# Patient Record
Sex: Female | Born: 1940
Health system: Southern US, Community
[De-identification: ages and names within clinical notes are randomized; demographics above are authoritative.]

## PROBLEM LIST (undated history)

## (undated) DIAGNOSIS — M1990 Unspecified osteoarthritis, unspecified site: Secondary | ICD-10-CM

## (undated) DIAGNOSIS — I10 Essential (primary) hypertension: Secondary | ICD-10-CM

## (undated) DIAGNOSIS — T7840XA Allergy, unspecified, initial encounter: Secondary | ICD-10-CM

## (undated) DIAGNOSIS — I209 Angina pectoris, unspecified: Secondary | ICD-10-CM

## (undated) DIAGNOSIS — G2581 Restless legs syndrome: Secondary | ICD-10-CM

## (undated) DIAGNOSIS — K219 Gastro-esophageal reflux disease without esophagitis: Secondary | ICD-10-CM

## (undated) DIAGNOSIS — C50919 Malignant neoplasm of unspecified site of unspecified female breast: Secondary | ICD-10-CM

## (undated) DIAGNOSIS — M719 Bursopathy, unspecified: Secondary | ICD-10-CM

## (undated) DIAGNOSIS — F419 Anxiety disorder, unspecified: Secondary | ICD-10-CM

## (undated) DIAGNOSIS — H269 Unspecified cataract: Secondary | ICD-10-CM

## (undated) DIAGNOSIS — M8580 Other specified disorders of bone density and structure, unspecified site: Secondary | ICD-10-CM

## (undated) DIAGNOSIS — R0600 Dyspnea, unspecified: Secondary | ICD-10-CM

## (undated) DIAGNOSIS — E7800 Pure hypercholesterolemia, unspecified: Secondary | ICD-10-CM

## (undated) DIAGNOSIS — G4700 Insomnia, unspecified: Secondary | ICD-10-CM

## (undated) DIAGNOSIS — N200 Calculus of kidney: Secondary | ICD-10-CM

## (undated) DIAGNOSIS — Z923 Personal history of irradiation: Secondary | ICD-10-CM

## (undated) HISTORY — DX: Other specified disorders of bone density and structure, unspecified site: M85.80

## (undated) HISTORY — DX: Unspecified osteoarthritis, unspecified site: M19.90

## (undated) HISTORY — DX: Unspecified cataract: H26.9

## (undated) HISTORY — PX: ROTATOR CUFF REPAIR: SHX139

## (undated) HISTORY — DX: Anxiety disorder, unspecified: F41.9

## (undated) HISTORY — DX: Insomnia, unspecified: G47.00

## (undated) HISTORY — PX: ABDOMINAL HYSTERECTOMY: SHX81

## (undated) HISTORY — DX: Allergy, unspecified, initial encounter: T78.40XA

## (undated) HISTORY — DX: Calculus of kidney: N20.0

## (undated) HISTORY — DX: Bursopathy, unspecified: M71.9

## (undated) HISTORY — DX: Restless legs syndrome: G25.81

## (undated) HISTORY — DX: Malignant neoplasm of unspecified site of unspecified female breast: C50.919

## (undated) HISTORY — DX: Gastro-esophageal reflux disease without esophagitis: K21.9

## (undated) HISTORY — PX: CHOLECYSTECTOMY: SHX55

## (undated) HISTORY — PX: APPENDECTOMY: SHX54

---

## 1986-10-06 DIAGNOSIS — G4700 Insomnia, unspecified: Secondary | ICD-10-CM

## 1986-10-06 HISTORY — DX: Insomnia, unspecified: G47.00

## 1998-10-06 DIAGNOSIS — G2581 Restless legs syndrome: Secondary | ICD-10-CM

## 1998-10-06 HISTORY — DX: Restless legs syndrome: G25.81

## 1999-05-20 ENCOUNTER — Other Ambulatory Visit: Admission: RE | Admit: 1999-05-20 | Discharge: 1999-05-20 | Payer: Self-pay | Admitting: Family Medicine

## 2000-10-06 DIAGNOSIS — M8580 Other specified disorders of bone density and structure, unspecified site: Secondary | ICD-10-CM

## 2000-10-06 DIAGNOSIS — R42 Dizziness and giddiness: Secondary | ICD-10-CM

## 2000-10-06 HISTORY — DX: Other specified disorders of bone density and structure, unspecified site: M85.80

## 2000-10-06 HISTORY — DX: Dizziness and giddiness: R42

## 2001-07-15 ENCOUNTER — Encounter: Admission: RE | Admit: 2001-07-15 | Discharge: 2001-07-15 | Payer: Self-pay | Admitting: Family Medicine

## 2001-07-15 ENCOUNTER — Encounter: Payer: Self-pay | Admitting: Family Medicine

## 2001-07-23 ENCOUNTER — Encounter: Admission: RE | Admit: 2001-07-23 | Discharge: 2001-07-23 | Payer: Self-pay | Admitting: Family Medicine

## 2001-07-23 ENCOUNTER — Encounter: Payer: Self-pay | Admitting: Family Medicine

## 2001-10-06 DIAGNOSIS — E7800 Pure hypercholesterolemia, unspecified: Secondary | ICD-10-CM

## 2001-10-06 DIAGNOSIS — I10 Essential (primary) hypertension: Secondary | ICD-10-CM

## 2001-10-06 HISTORY — DX: Pure hypercholesterolemia, unspecified: E78.00

## 2001-10-06 HISTORY — DX: Essential (primary) hypertension: I10

## 2003-12-20 ENCOUNTER — Encounter: Admission: RE | Admit: 2003-12-20 | Discharge: 2003-12-20 | Payer: Self-pay | Admitting: Family Medicine

## 2006-10-06 DIAGNOSIS — K219 Gastro-esophageal reflux disease without esophagitis: Secondary | ICD-10-CM

## 2006-10-06 DIAGNOSIS — M1990 Unspecified osteoarthritis, unspecified site: Secondary | ICD-10-CM

## 2006-10-06 HISTORY — DX: Unspecified osteoarthritis, unspecified site: M19.90

## 2006-10-06 HISTORY — DX: Gastro-esophageal reflux disease without esophagitis: K21.9

## 2007-02-25 ENCOUNTER — Encounter: Admission: RE | Admit: 2007-02-25 | Discharge: 2007-02-25 | Payer: Self-pay | Admitting: Family Medicine

## 2007-05-20 ENCOUNTER — Ambulatory Visit: Payer: Self-pay | Admitting: Internal Medicine

## 2007-06-22 ENCOUNTER — Ambulatory Visit: Payer: Self-pay | Admitting: Internal Medicine

## 2007-08-24 ENCOUNTER — Ambulatory Visit: Payer: Self-pay | Admitting: Internal Medicine

## 2010-02-03 HISTORY — PX: CATARACT EXTRACTION: SUR2

## 2010-06-02 ENCOUNTER — Emergency Department (HOSPITAL_COMMUNITY): Admission: EM | Admit: 2010-06-02 | Discharge: 2010-06-02 | Payer: Self-pay | Admitting: Emergency Medicine

## 2010-12-20 LAB — BASIC METABOLIC PANEL
BUN: 15 mg/dL (ref 6–23)
CO2: 26 mEq/L (ref 19–32)
Calcium: 10.4 mg/dL (ref 8.4–10.5)
Chloride: 106 mEq/L (ref 96–112)
Creatinine, Ser: 0.63 mg/dL (ref 0.4–1.2)
GFR calc Af Amer: >60 mL/min (ref >60)
GFR calc non Af Amer: >60 mL/min (ref >60)
Glucose, Bld: 94 mg/dL (ref 70–99)
Potassium: 3.8 mEq/L (ref 3.5–5.1)
Sodium: 138 mEq/L (ref 135–145)

## 2010-12-20 LAB — TROPONIN I: Troponin I: 0.02 ng/mL (ref 0.00–0.06)

## 2010-12-20 LAB — CK TOTAL AND CKMB (NOT AT ARMC)
CK, MB: 1.6 ng/mL (ref 0.3–4.0)
Relative Index: INVALID (ref 0.0–2.5)
Total CK: 87 U/L (ref 7–177)

## 2011-02-18 NOTE — Assessment & Plan Note (Signed)
[facility]                             PULMONARY OFFICE NOTE   Alicia [name]                          MRN:          [id number]  DATE:05/20/2007                            DOB:          1940/12/09    PROBLEM:  A 70 year old woman self-referred because of persistent dry  cough.  I had worked with her at the old office in 2004 for bronchitis  with a past history of pneumonia, histoplasmosis and bronchoscopy.  She  had had pneumococcal vaccine twice at that point and she had quit  smoking some years before.  Chest x-ray then showed old calcified right  paratracheal nodes and some question of hyperinflation.  Spirometry  scores were normal.  She says she had been well and comfortable in  recent years until she went to [place] this spring.  She caught a  bronchitis syndrome there with fever, flu-like symptoms, head and chest  congestion, and some purulent sputum.  That cleared after antibiotics  except for residual dry cough, which is her complaint today.  She  describes this as hacking until sometimes she nearly retches, with a  sense of some substernal tightness and postnasal drip.  She is aware of  reflux as a separate, ongoing problem.  Cough is intermittent.  She is  not noticing wheeze but thinks she is somewhat more short of breath with  exertion than is usual for her.  She thinks pollens and respiratory  irritants make her worse.   MEDICATION:  1. Metoprolol 50 mg.  2. Nexium 40 mg.  3. Zolpidem 10 mg.  4. Simvastatin.  5. Calcium.  6. Fish oil.  7. Lutein.  8. Vitamins.  9. Occasional Claritin.   No medication allergy.   REVIEW OF SYSTEMS:  Exertional dyspnea, nonproductive cough, tightness  rather than chest pain.  Reflux symptoms are suppressed by Nexium but  still present.  Nasal congestion, sneezing.  Occasional racing  palpitation as she gets into bed at night, self-limited after a few  minutes.  Her feet swell, especially in  the last couple of months.  No  fever, adenopathy, or blood.   PAST HISTORY:  In [place] several years ago, histoplasmosis was  suspected based on calcification on chest x-ray with hemoptysis and she  had bronchoscopy.  There was no recurrence of hemoptysis.  She has been  exposed to tuberculosis in the past.  Restless leg syndrome.  Arthritis  with joint pains.  Cholecystectomy, hysterectomy.  Elevated cholesterol.  Hypertension.   SOCIAL HISTORY:  Quit smoking 10 years ago.  She is married.  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   FAMILY HISTORY:  All are smokers and many had COPD.  Two died of  emphysema.  Some have heart disease.   OBJECTIVE:  Weight 145 pounds, BP 122/62, pulse 53, room air saturation  97%.  No evident distress.  Dentures.  Medium build.  Dry cough.  Nose  and throat are clear with no evidence of drainage, no stridor, no neck  vein distention or adenopathy.  Chest clear.  Heart sounds regular  without murmur.  No enlargement of the liver or spleen is palpated.  There is no cyanosis, clubbing or edema.   IMPRESSION:  1. Bronchitis syndrome with cough.  This may fit a post-viral      bronchitis syndrome, although residual sinus drainage and      contribution from reflux are not excluded.  2. Old granulomatous disease thought to be histoplasmosis with episode      of hemoptysis generating bronchoscopy years ago in [place].   PLAN:  Chest x-ray.  Sample Advair 250/50.  Home nebulizer Xopenex 1.25  mg and Depo-Medrol 80 mg IM.  Pulmonary function tests.  Schedule return  3-4 weeks, earlier p.r.n.     Alicia D. Maple Hudson, MD, Alicia [name], FACP  Electronically Signed    CDY/MedQ  DD: 05/20/2007  DT: 05/21/2007  Job #: [id number]   cc:   Alicia [name]. Alicia [name], M.D.

## 2011-02-18 NOTE — Assessment & Plan Note (Signed)
[facility]                             PULMONARY OFFICE NOTE   Alicia [name], Alicia [name]                          MRN:          [id number]  DATE:08/24/2007                            DOB:          Apr 21, 1941    PULMONARY FOLLOW-UP:   PROBLEMS:  1. Bronchitis.  2. Old granulomatous disease/histoplasmosis/history of hemoptysis.  3. Esophageal reflux.   HISTORY:  We note again that previous TB skin test has been negative as  of June 25, 2007.  She had a significant viral syndrome with  bronchitis and head congestion treated with Mucinex and Delsym with some  residual left temple headache.  Since then Spiriva helps.  She is having  less reflux and thinks Nexium helps that discomfort.  Has had a flu  shot.  Had second pneumonia vaccine in 2007.   MEDICATIONS:  1. Metoprolol 50 mg.  2. Nexium 40 mg.  3. Zolpidem 10 mg.  4. Simvastatin.  5. Calcium.  6. Fish oil.  7. Vitamins.  8. Tylenol.   No medication allergies.   Chest x-ray May 20, 2007, showed no active cardiopulmonary disease  but did show old granulomatous disease with calcified right paratracheal  nodes.   OBJECTIVE:  Weight 152 pounds, BP 110/62, pulse 55.  Room air saturation  99%.  Reddish throat.  Dentures.  Slight upper airway-type cough with no  stridor or visible drainage.  CHEST:  Clear.  Heart sounds normal.  No adenopathy found.   IMPRESSION:  Recent viral bronchitis syndrome, still with incomplete  clearing.  Complicating issue of esophageal reflux, old known  granulomatous scar, multifactorial cough.   PLAN:  Reflux precautions were emphasized.  She will make use of Mucinex  and Delsym p.r.n.  Schedule return 4 months, earlier p.r.n.     Clinton D. Maple Hudson, MD, Tonny Bollman, FACP  Electronically Signed    CDY/MedQ  DD: 08/25/2007  DT: 08/26/2007  Job #: [id number]   cc:   Bryan Lemma. Manus Gunning, M.D.

## 2011-02-18 NOTE — Assessment & Plan Note (Signed)
[facility]                             PULMONARY OFFICE NOTE   [name], Alicia [name]                          MRN:          [id number]  DATE:06/22/2007                            DOB:          1941-06-21    PROBLEMS:  1. Bronchitis.  2. Old granulomatous disease/histoplasmosis/history of hemoptysis.  3. Esophageal reflux.   HISTORY:  This former smoker with remote exposure to her father's  tuberculosis (TB skin test always negative) reports cough is somewhat  better.  It remains nonproductive.  She has a foreign body sensation in  her throat at times.  Sinuses burn when she bends over.  Little sputum,  nothing purulent.  Frequent night sweats which she relates to hormones.  Advair sample did not help.  She is aware of reflux and takes Nexium but  needed to be reminded of antireflux measures.   MEDICATION:  1. Metoprolol 50 mg.  2. Nexium 40 mg.  3. Ambien 10 mg.  4. Simvastatin.  5. Fish oil.  6. Lutein.  7. Claritin.  8. Tylenol for arthritis.   No medication allergy.   OBJECTIVE:  Weight 145 pounds, BP 114/62, pulse 61, room air saturation  98%.  Minor dry cough.  Red throat.  Voice quality is normal.  Nasal  airway clear without postnasal drainage.  Heart sounds regular without  murmur.  No adenopathy.   Chest x-ray from April 14 showed no active cardiopulmonary disease.  There was evidence of old granulomatous disease with calcified right  paratracheal nodes.  Mild prominence of the interstitium was related to  chronic bronchitis.  Pulmonary function tests done today show mild  obstruction in small airways with response to bronchodilator, suggestion  of air trapping with increased residual volume, and reduced diffusion  capacity at 67% of predicted.   IMPRESSION:  1. Cough reflecting chronic bronchitis and gastroesophageal reflux      disease.  2. Asthmatic bronchitis/chronic obstructive pulmonary disease.   PLAN:  1. Sample  Astelin nasal spray for rhinitis.  2. Try sample Spiriva one inhalation daily.  3. PPD skin test is placed.  4. Doubt night sweats reflect infection and I suggested that she      review her hormone status with Dr. Manus Gunning.  5. Reflux precautions, considering need for GI referral.  Dr. Manus Gunning      may want to chose who would see her.  I suspect reflux is a      significant part of her cough.   Scheduled return 2 months, earlier p.r.n.     Clinton D. Maple Hudson, MD, Tonny Bollman, FACP  Electronically Signed    CDY/MedQ  DD: 06/22/2007  DT: 06/23/2007  Job #: [id number]   cc:   Bryan Lemma. Manus Gunning, M.D.

## 2011-10-09 ENCOUNTER — Ambulatory Visit
Admission: RE | Admit: 2011-10-09 | Discharge: 2011-10-09 | Disposition: A | Discharge status: Home / Self Care | Payer: Medicare Other | Source: Ambulatory Visit | Attending: Family Medicine | Admitting: Family Medicine

## 2011-10-09 ENCOUNTER — Other Ambulatory Visit: Payer: Self-pay | Admitting: Family Medicine

## 2011-10-09 DIAGNOSIS — S0990XA Unspecified injury of head, initial encounter: Secondary | ICD-10-CM | POA: Diagnosis not present

## 2011-10-09 DIAGNOSIS — R200 Anesthesia of skin: Secondary | ICD-10-CM

## 2011-10-09 DIAGNOSIS — IMO0002 Reserved for concepts with insufficient information to code with codable children: Secondary | ICD-10-CM | POA: Diagnosis not present

## 2011-10-09 DIAGNOSIS — R51 Headache: Secondary | ICD-10-CM | POA: Diagnosis not present

## 2011-10-09 DIAGNOSIS — G44009 Cluster headache syndrome, unspecified, not intractable: Secondary | ICD-10-CM

## 2011-10-09 DIAGNOSIS — R209 Unspecified disturbances of skin sensation: Secondary | ICD-10-CM | POA: Diagnosis not present

## 2011-10-09 DIAGNOSIS — S0993XA Unspecified injury of face, initial encounter: Secondary | ICD-10-CM | POA: Diagnosis not present

## 2011-10-16 DIAGNOSIS — M545 Low back pain, unspecified: Secondary | ICD-10-CM | POA: Diagnosis not present

## 2011-10-16 DIAGNOSIS — M19049 Primary osteoarthritis, unspecified hand: Secondary | ICD-10-CM | POA: Diagnosis not present

## 2011-10-16 DIAGNOSIS — M064 Inflammatory polyarthropathy: Secondary | ICD-10-CM | POA: Diagnosis not present

## 2011-10-16 DIAGNOSIS — Z79899 Other long term (current) drug therapy: Secondary | ICD-10-CM | POA: Diagnosis not present

## 2011-11-27 DIAGNOSIS — M545 Low back pain, unspecified: Secondary | ICD-10-CM | POA: Diagnosis not present

## 2011-11-27 DIAGNOSIS — Z79899 Other long term (current) drug therapy: Secondary | ICD-10-CM | POA: Diagnosis not present

## 2011-11-27 DIAGNOSIS — M19049 Primary osteoarthritis, unspecified hand: Secondary | ICD-10-CM | POA: Diagnosis not present

## 2011-12-24 DIAGNOSIS — R0602 Shortness of breath: Secondary | ICD-10-CM | POA: Diagnosis not present

## 2011-12-24 DIAGNOSIS — I10 Essential (primary) hypertension: Secondary | ICD-10-CM | POA: Diagnosis not present

## 2011-12-24 DIAGNOSIS — M159 Polyosteoarthritis, unspecified: Secondary | ICD-10-CM | POA: Diagnosis not present

## 2011-12-24 DIAGNOSIS — E7800 Pure hypercholesterolemia, unspecified: Secondary | ICD-10-CM | POA: Diagnosis not present

## 2011-12-24 DIAGNOSIS — Z79899 Other long term (current) drug therapy: Secondary | ICD-10-CM | POA: Diagnosis not present

## 2011-12-24 DIAGNOSIS — R5381 Other malaise: Secondary | ICD-10-CM | POA: Diagnosis not present

## 2011-12-24 DIAGNOSIS — Z23 Encounter for immunization: Secondary | ICD-10-CM | POA: Diagnosis not present

## 2012-01-08 DIAGNOSIS — E7800 Pure hypercholesterolemia, unspecified: Secondary | ICD-10-CM | POA: Diagnosis not present

## 2012-01-08 DIAGNOSIS — R002 Palpitations: Secondary | ICD-10-CM | POA: Diagnosis not present

## 2012-01-08 DIAGNOSIS — R079 Chest pain, unspecified: Secondary | ICD-10-CM | POA: Diagnosis not present

## 2012-01-08 DIAGNOSIS — I10 Essential (primary) hypertension: Secondary | ICD-10-CM | POA: Diagnosis not present

## 2012-01-13 DIAGNOSIS — I10 Essential (primary) hypertension: Secondary | ICD-10-CM | POA: Diagnosis not present

## 2012-01-13 DIAGNOSIS — E7800 Pure hypercholesterolemia, unspecified: Secondary | ICD-10-CM | POA: Diagnosis not present

## 2012-01-13 DIAGNOSIS — R079 Chest pain, unspecified: Secondary | ICD-10-CM | POA: Diagnosis not present

## 2012-01-13 DIAGNOSIS — R002 Palpitations: Secondary | ICD-10-CM | POA: Diagnosis not present

## 2012-02-29 DIAGNOSIS — L255 Unspecified contact dermatitis due to plants, except food: Secondary | ICD-10-CM | POA: Diagnosis not present

## 2012-05-24 DIAGNOSIS — L255 Unspecified contact dermatitis due to plants, except food: Secondary | ICD-10-CM | POA: Diagnosis not present

## 2012-06-15 DIAGNOSIS — Z79899 Other long term (current) drug therapy: Secondary | ICD-10-CM | POA: Diagnosis not present

## 2012-06-15 DIAGNOSIS — K219 Gastro-esophageal reflux disease without esophagitis: Secondary | ICD-10-CM | POA: Diagnosis not present

## 2012-06-15 DIAGNOSIS — Z23 Encounter for immunization: Secondary | ICD-10-CM | POA: Diagnosis not present

## 2012-06-15 DIAGNOSIS — I10 Essential (primary) hypertension: Secondary | ICD-10-CM | POA: Diagnosis not present

## 2012-06-15 DIAGNOSIS — M159 Polyosteoarthritis, unspecified: Secondary | ICD-10-CM | POA: Diagnosis not present

## 2012-06-15 DIAGNOSIS — E7800 Pure hypercholesterolemia, unspecified: Secondary | ICD-10-CM | POA: Diagnosis not present

## 2012-09-09 DIAGNOSIS — Z1231 Encounter for screening mammogram for malignant neoplasm of breast: Secondary | ICD-10-CM | POA: Diagnosis not present

## 2012-10-06 DIAGNOSIS — F419 Anxiety disorder, unspecified: Secondary | ICD-10-CM

## 2012-10-06 HISTORY — DX: Anxiety disorder, unspecified: F41.9

## 2012-10-29 DIAGNOSIS — H52229 Regular astigmatism, unspecified eye: Secondary | ICD-10-CM | POA: Diagnosis not present

## 2012-10-29 DIAGNOSIS — H5210 Myopia, unspecified eye: Secondary | ICD-10-CM | POA: Diagnosis not present

## 2012-10-29 DIAGNOSIS — H01009 Unspecified blepharitis unspecified eye, unspecified eyelid: Secondary | ICD-10-CM | POA: Diagnosis not present

## 2012-11-08 DIAGNOSIS — N899 Noninflammatory disorder of vagina, unspecified: Secondary | ICD-10-CM | POA: Diagnosis not present

## 2012-11-08 DIAGNOSIS — L293 Anogenital pruritus, unspecified: Secondary | ICD-10-CM | POA: Diagnosis not present

## 2012-11-24 ENCOUNTER — Emergency Department (HOSPITAL_COMMUNITY)
Admission: EM | Admit: 2012-11-24 | Discharge: 2012-11-24 | Disposition: A | Discharge status: Home / Self Care | Payer: Medicare Other | Attending: Emergency Medicine | Admitting: Emergency Medicine

## 2012-11-24 ENCOUNTER — Encounter (HOSPITAL_COMMUNITY): Payer: Self-pay | Admitting: *Deleted

## 2012-11-24 ENCOUNTER — Emergency Department (HOSPITAL_COMMUNITY): Payer: Medicare Other

## 2012-11-24 DIAGNOSIS — N201 Calculus of ureter: Secondary | ICD-10-CM | POA: Insufficient documentation

## 2012-11-24 DIAGNOSIS — Z8639 Personal history of other endocrine, nutritional and metabolic disease: Secondary | ICD-10-CM | POA: Insufficient documentation

## 2012-11-24 DIAGNOSIS — Z9089 Acquired absence of other organs: Secondary | ICD-10-CM | POA: Diagnosis not present

## 2012-11-24 DIAGNOSIS — I10 Essential (primary) hypertension: Secondary | ICD-10-CM | POA: Insufficient documentation

## 2012-11-24 DIAGNOSIS — Z90710 Acquired absence of both cervix and uterus: Secondary | ICD-10-CM | POA: Diagnosis not present

## 2012-11-24 DIAGNOSIS — R112 Nausea with vomiting, unspecified: Secondary | ICD-10-CM | POA: Diagnosis not present

## 2012-11-24 DIAGNOSIS — Z862 Personal history of diseases of the blood and blood-forming organs and certain disorders involving the immune mechanism: Secondary | ICD-10-CM | POA: Insufficient documentation

## 2012-11-24 HISTORY — DX: Pure hypercholesterolemia, unspecified: E78.00

## 2012-11-24 HISTORY — DX: Essential (primary) hypertension: I10

## 2012-11-24 LAB — URINALYSIS, ROUTINE W REFLEX MICROSCOPIC
Bilirubin Urine: NEGATIVE
Glucose, UA: NEGATIVE mg/dL
Ketones, ur: NEGATIVE mg/dL
Leukocytes, UA: NEGATIVE
Nitrite: NEGATIVE
Protein, ur: NEGATIVE mg/dL
Specific Gravity, Urine: >1.03 — ABNORMAL HIGH (ref 1.005–1.030)
Urobilinogen, UA: 0.2 mg/dL (ref 0.0–1.0)
pH: 5.5 (ref 5.0–8.0)

## 2012-11-24 LAB — BASIC METABOLIC PANEL
BUN: 28 mg/dL — ABNORMAL HIGH (ref 6–23)
CO2: 28 mEq/L (ref 19–32)
Calcium: 10.5 mg/dL (ref 8.4–10.5)
Chloride: 103 mEq/L (ref 96–112)
Creatinine, Ser: 0.9 mg/dL (ref 0.50–1.10)
GFR calc Af Amer: 74 mL/min — ABNORMAL LOW (ref >90)
GFR calc non Af Amer: 64 mL/min — ABNORMAL LOW (ref >90)
Glucose, Bld: 105 mg/dL — ABNORMAL HIGH (ref 70–99)
Potassium: 4 mEq/L (ref 3.5–5.1)
Sodium: 139 mEq/L (ref 135–145)

## 2012-11-24 LAB — CBC
HCT: 41.3 % (ref 36.0–46.0)
Hemoglobin: 14.4 g/dL (ref 12.0–15.0)
MCH: 30.7 pg (ref 26.0–34.0)
MCHC: 34.9 g/dL (ref 30.0–36.0)
MCV: 88.1 fL (ref 78.0–100.0)
Platelets: 327 10*3/uL (ref 150–400)
RBC: 4.69 MIL/uL (ref 3.87–5.11)
RDW: 12.5 % (ref 11.5–15.5)
WBC: 11 10*3/uL — ABNORMAL HIGH (ref 4.0–10.5)

## 2012-11-24 LAB — URINE MICROSCOPIC-ADD ON

## 2012-11-24 MED ORDER — ONDANSETRON HCL 4 MG/2ML IJ SOLN
INTRAMUSCULAR | Status: AC
  Filled 2012-11-24: qty 2

## 2012-11-24 MED ORDER — ONDANSETRON HCL 4 MG/2ML IJ SOLN
4.0000 mg | Freq: Once | INTRAMUSCULAR | Status: AC
  Administered 2012-11-24: 4 mg via INTRAVENOUS

## 2012-11-24 MED ORDER — FENTANYL CITRATE 0.05 MG/ML IJ SOLN
50.0000 ug | Freq: Once | INTRAMUSCULAR | Status: AC
  Administered 2012-11-24: 50 ug via INTRAVENOUS
  Filled 2012-11-24: qty 2

## 2012-11-24 MED ORDER — TAMSULOSIN HCL 0.4 MG PO CAPS: 0.4000 mg | ORAL_CAPSULE | Freq: Every day | ORAL | Status: DC

## 2012-11-24 MED ORDER — HYDROMORPHONE HCL PF 1 MG/ML IJ SOLN
1.0000 mg | Freq: Once | INTRAMUSCULAR | Status: AC
  Administered 2012-11-24: 1 mg via INTRAVENOUS
  Filled 2012-11-24: qty 1

## 2012-11-24 MED ORDER — FENTANYL CITRATE 0.05 MG/ML IJ SOLN
50.0000 ug | Freq: Once | INTRAMUSCULAR | Status: AC
  Administered 2012-11-24: 50 ug via INTRAVENOUS

## 2012-11-24 MED ORDER — FENTANYL CITRATE 0.05 MG/ML IJ SOLN
INTRAMUSCULAR | Status: AC
  Administered 2012-11-24: 50 ug via INTRAVENOUS
  Filled 2012-11-24: qty 2

## 2012-11-24 MED ORDER — ONDANSETRON HCL 4 MG/2ML IJ SOLN
4.0000 mg | Freq: Once | INTRAMUSCULAR | Status: AC
  Administered 2012-11-24: 4 mg via INTRAVENOUS
  Filled 2012-11-24: qty 2

## 2012-11-24 MED ORDER — OXYCODONE-ACETAMINOPHEN 5-325 MG PO TABS: 1.0000 | ORAL_TABLET | ORAL | Status: DC | PRN

## 2012-11-24 NOTE — ED Notes (Signed)
Pt actively vomiting with c/o severe (10/10) left flank pain.

## 2012-11-24 NOTE — ED Notes (Signed)
Left flank pain with vomiting began this morning at ~ 0800

## 2012-11-24 NOTE — ED Provider Notes (Addendum)
History     CSN: [id number]  Arrival date & time 11/24/12  1156   First MD Initiated Contact with Patient 11/24/12 1306      Chief Complaint  Patient presents with  . Flank Pain    (Consider location/radiation/quality/duration/timing/severity/associated sxs/prior treatment) Patient is a 72 y.o. female presenting with flank pain. The history is provided by the patient and medical records. No language interpreter was used.  Flank Pain This is a new problem. The current episode started 6 to 12 hours ago (Pt had onset of severe left flank pain around 8 AM, accompanied by nausea and vomiting.). Episode frequency: She's had 2 episodes of vomiting. The pain waxes and wanes in severity. The problem has not changed since onset.Pertinent negatives include no chest pain, no abdominal pain, no headaches and no shortness of breath. Nothing aggravates the symptoms. Relieved by: Patient was medicated with fentanyl and Zofran by the ED nurse.    Past Medical History  Diagnosis Date  . Hypertension   . Hypercholesteremia     Past Surgical History  Procedure Laterality Date  . Abdominal hysterectomy    . Cholecystectomy    . Appendectomy      No family history on file.  History  Substance Use Topics  . Smoking status: Never Smoker   . Smokeless tobacco: Not on file  . Alcohol Use: No    OB History   Grav Para Term Preterm Abortions TAB SAB Ect Mult Living                  Review of Systems  Constitutional: Negative for fever and chills.  HENT: Negative.   Eyes: Negative.   Respiratory: Negative.  Negative for shortness of breath.   Cardiovascular: Negative for chest pain.  Gastrointestinal: Positive for nausea and vomiting. Negative for abdominal pain and diarrhea.  Genitourinary: Positive for flank pain. Negative for dysuria and hematuria.  Musculoskeletal: Negative.   Skin: Negative.   Neurological: Negative.  Negative for headaches.  Psychiatric/Behavioral: Negative.      Allergies  Review of patient's allergies indicates no known allergies.  Home Medications  No current outpatient prescriptions on file.  BP 189/86  Pulse 68  Temp(Src) 98 F (36.7 C) (Oral)  Resp 20  Ht 5\' 2"  (1.575 m)  Wt 147 lb (66.679 kg)  BMI 26.88 kg/m2  SpO2 96%  Physical Exam  Nursing note and vitals reviewed. Constitutional: She is oriented to person, place, and time. She appears well-developed and well-nourished. Distressed: in mild distress with left flank pain. She had previously been medicated for pain and nausea.  HENT:  Head: Normocephalic and atraumatic.  Right Ear: External ear normal.  Left Ear: External ear normal.  Mouth/Throat: Oropharynx is clear and moist.  Eyes: Conjunctivae and EOM are normal. Pupils are equal, round, and reactive to light.  Neck: Normal range of motion. Neck supple.  Cardiovascular: Normal rate, regular rhythm and normal heart sounds.   Pulmonary/Chest: Effort normal and breath sounds normal.  Abdominal: Soft. Bowel sounds are normal. She exhibits no distension and no mass. There is no tenderness.  Musculoskeletal:  She has left CVA tenderness but no point tenderness or rebound or rigidity.  Neurological: She is alert and oriented to person, place, and time.  No Sensory or motor deficit.  Skin: Skin is warm and dry.  Psychiatric: She has a normal mood and affect. Her behavior is normal.    ED Course  Procedures (including critical care time)  Labs Reviewed  CBC - Abnormal; Notable for the following:    WBC 11.0 (*)    All other components within normal limits  URINALYSIS, ROUTINE W REFLEX MICROSCOPIC  BASIC METABOLIC PANEL   8:11 PM Patient was seen and had physical examination. Lab tests were ordered. Patient had been medicated for pain and nausea already, did not need pain medicine at present.  2:30 PM Results for orders placed during the hospital encounter of 11/24/12  URINALYSIS, ROUTINE W REFLEX MICROSCOPIC       Result Value Range   Color, Urine YELLOW  YELLOW   APPearance HAZY (*) CLEAR   Specific Gravity, Urine >1.030 (*) 1.005 - 1.030   pH 5.5  5.0 - 8.0   Glucose, UA NEGATIVE  NEGATIVE mg/dL   Hgb urine dipstick LARGE (*) NEGATIVE   Bilirubin Urine NEGATIVE  NEGATIVE   Ketones, ur NEGATIVE  NEGATIVE mg/dL   Protein, ur NEGATIVE  NEGATIVE mg/dL   Urobilinogen, UA 0.2  0.0 - 1.0 mg/dL   Nitrite NEGATIVE  NEGATIVE   Leukocytes, UA NEGATIVE  NEGATIVE  CBC      Result Value Range   WBC 11.0 (*) 4.0 - 10.5 K/uL   RBC 4.69  3.87 - 5.11 MIL/uL   Hemoglobin 14.4  12.0 - 15.0 g/dL   HCT 91.4  78.2 - 95.6 %   MCV 88.1  78.0 - 100.0 fL   MCH 30.7  26.0 - 34.0 pg   MCHC 34.9  30.0 - 36.0 g/dL   RDW 21.3  08.6 - 57.8 %   Platelets 327  150 - 400 K/uL  BASIC METABOLIC PANEL      Result Value Range   Sodium 139  135 - 145 mEq/L   Potassium 4.0  3.5 - 5.1 mEq/L   Chloride 103  96 - 112 mEq/L   CO2 28  19 - 32 mEq/L   Glucose, Bld 105 (*) 70 - 99 mg/dL   BUN 28 (*) 6 - 23 mg/dL   Creatinine, Ser 4.69  0.50 - 1.10 mg/dL   Calcium 62.9  8.4 - 52.8 mg/dL   GFR calc non Af Amer 64 (*) >90 mL/min   GFR calc Af Amer 74 (*) >90 mL/min  URINE MICROSCOPIC-ADD ON      Result Value Range   Squamous Epithelial / LPF FEW (*) RARE   WBC, UA 0-2  <3 WBC/hpf   RBC / HPF 11-20  <3 RBC/hpf   Bacteria, UA FEW (*) RARE   Crystals CA OXALATE CRYSTALS (*) NEGATIVE   Urine-Other MUCOUS PRESENT     Ct Abdomen Pelvis Wo Contrast  11/24/2012  *RADIOLOGY REPORT*  Clinical Data: Severe left flank pain.  CT ABDOMEN AND PELVIS WITHOUT CONTRAST  Technique:  Multidetector CT imaging of the abdomen and pelvis was performed following the standard protocol without intravenous contrast.  Comparison: None  Findings: The lung bases are clear.  Mild emphysematous changes with right middle lobe bulla.  The distal esophagus is grossly normal.  The heart is normal in size.  The unenhanced appearance of the liver is unremarkable.  No  focal hepatic lesions or intrahepatic ductal dilatation.  The gallbladder is surgically absent.  No common bile duct dilatation.  The pancreas is normal.  Calcified splenic granulomas are noted. Normal splenic size.  The adrenal glands and right kidney are normal.  The left kidney demonstrates a high-grade obstructive findings with hydronephrosis and perinephric interstitial changes of fluid.  The left ureter is dilated down to  an obstructing 3.5 x 3.5 mm calculus at the level of the L4 vertebral body.  No distal ureteral or bladder calculi.  The stomach, duodenum, small bowel and colon are grossly normal without oral contrast.  Known cavitary changes or mass lesions.  No mesenteric or retroperitoneal mass or adenopathy.  Moderate atherosclerotic changes involving the aorta but no focal aneurysm.  The bladder is unremarkable.  The uterus is surgically absent. Both ovaries are still present and appear normal.  No pelvic mass, adenopathy or free pelvic fluid collection.  No inguinal mass or hernia.  The bony structures are intact.  IMPRESSION: 3.5 x 3.5 mm left mid ureteral calculus at the L4 level causing a high-grade obstruction of the left kidney/ureter.   Original Report Authenticated By: Rudie Meyer, M.D.     CT of abdomen/pelvis shows a left ureteral calculus 3.5 mm in diameter, that is at the level of L4.  Rx with Percocet for pain, Flomax to facilitate stone passage, pt to strain her urine to catch the stone, F/U with Dr. Jerre Simon, urologist.    1. Left ureteral calculus         Carleene Cooper III, MD 11/24/12 1437    Carleene Cooper III, MD 11/24/12 [phone]

## 2012-11-24 NOTE — ED Notes (Signed)
Pt request more pain medication before going over for CT.

## 2012-12-14 ENCOUNTER — Other Ambulatory Visit: Payer: Self-pay | Admitting: Urology

## 2012-12-14 ENCOUNTER — Ambulatory Visit (INDEPENDENT_AMBULATORY_CARE_PROVIDER_SITE_OTHER): Payer: Medicare Other | Admitting: Urology

## 2012-12-14 ENCOUNTER — Ambulatory Visit (HOSPITAL_COMMUNITY)
Admission: RE | Admit: 2012-12-14 | Discharge: 2012-12-14 | Disposition: A | Discharge status: Home / Self Care | Payer: Medicare Other | Source: Ambulatory Visit | Attending: Urology | Admitting: Urology

## 2012-12-14 DIAGNOSIS — N201 Calculus of ureter: Secondary | ICD-10-CM

## 2012-12-14 DIAGNOSIS — N200 Calculus of kidney: Secondary | ICD-10-CM | POA: Diagnosis not present

## 2012-12-14 DIAGNOSIS — R109 Unspecified abdominal pain: Secondary | ICD-10-CM | POA: Diagnosis not present

## 2012-12-16 DIAGNOSIS — E7800 Pure hypercholesterolemia, unspecified: Secondary | ICD-10-CM | POA: Diagnosis not present

## 2012-12-16 DIAGNOSIS — G479 Sleep disorder, unspecified: Secondary | ICD-10-CM | POA: Diagnosis not present

## 2012-12-16 DIAGNOSIS — K219 Gastro-esophageal reflux disease without esophagitis: Secondary | ICD-10-CM | POA: Diagnosis not present

## 2012-12-16 DIAGNOSIS — M899 Disorder of bone, unspecified: Secondary | ICD-10-CM | POA: Diagnosis not present

## 2012-12-16 DIAGNOSIS — I10 Essential (primary) hypertension: Secondary | ICD-10-CM | POA: Diagnosis not present

## 2012-12-16 DIAGNOSIS — G2589 Other specified extrapyramidal and movement disorders: Secondary | ICD-10-CM | POA: Diagnosis not present

## 2012-12-16 DIAGNOSIS — N200 Calculus of kidney: Secondary | ICD-10-CM | POA: Diagnosis not present

## 2012-12-16 DIAGNOSIS — Z79899 Other long term (current) drug therapy: Secondary | ICD-10-CM | POA: Diagnosis not present

## 2012-12-30 DIAGNOSIS — L293 Anogenital pruritus, unspecified: Secondary | ICD-10-CM | POA: Diagnosis not present

## 2013-02-01 ENCOUNTER — Ambulatory Visit (INDEPENDENT_AMBULATORY_CARE_PROVIDER_SITE_OTHER): Payer: Medicare Other | Admitting: Urology

## 2013-02-01 DIAGNOSIS — N201 Calculus of ureter: Secondary | ICD-10-CM | POA: Diagnosis not present

## 2013-02-01 DIAGNOSIS — N200 Calculus of kidney: Secondary | ICD-10-CM | POA: Diagnosis not present

## 2013-02-08 DIAGNOSIS — J019 Acute sinusitis, unspecified: Secondary | ICD-10-CM | POA: Diagnosis not present

## 2013-02-08 DIAGNOSIS — M899 Disorder of bone, unspecified: Secondary | ICD-10-CM | POA: Diagnosis not present

## 2013-02-08 DIAGNOSIS — M949 Disorder of cartilage, unspecified: Secondary | ICD-10-CM | POA: Diagnosis not present

## 2013-02-26 IMAGING — CT CT HEAD W/O CM
2 series · 16 of 30 positions shown, 18 images · non-contrast
Comparison: None.

CLINICAL DATA: A cluster headaches, numbness in the face, trauma to
the head 3 weeks ago

CT HEAD WITHOUT CONTRAST
TECHNIQUE: Contiguous axial images were obtained from the base of
the skull through the vertex without contrast.

[Series 3: head bone · axial · 0.49mm/px · z∈[+38,+152]mm · 8 of 56 slices shown]
[im 6/56  bone]
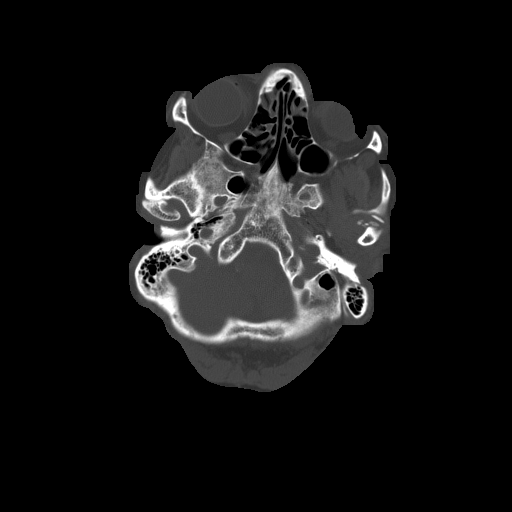
[im 12/56  bone]
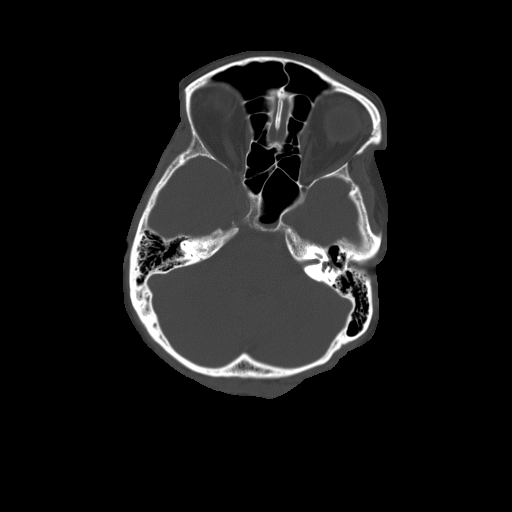
[im 18/56  bone]
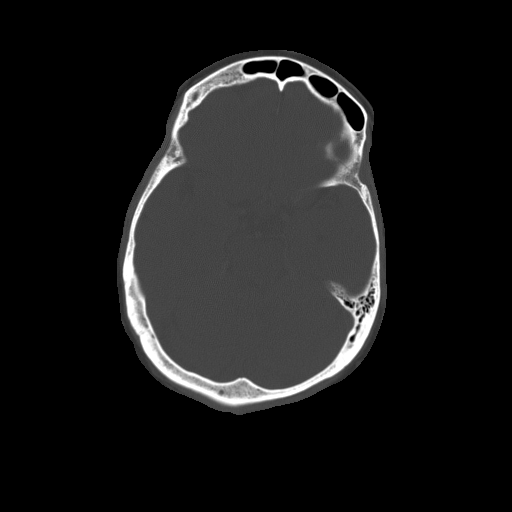
[im 24/56  bone]
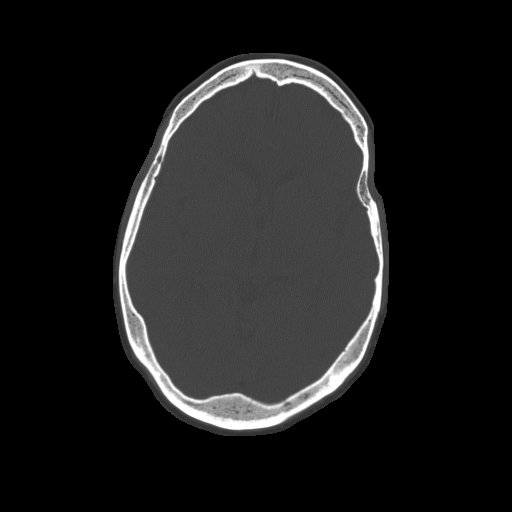
[im 32/56  bone]
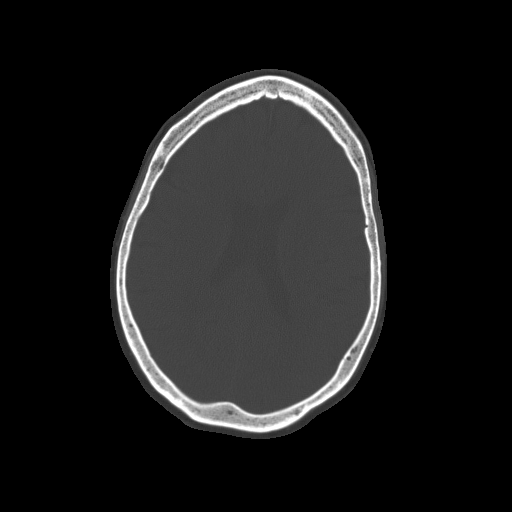
[im 38/56  bone]
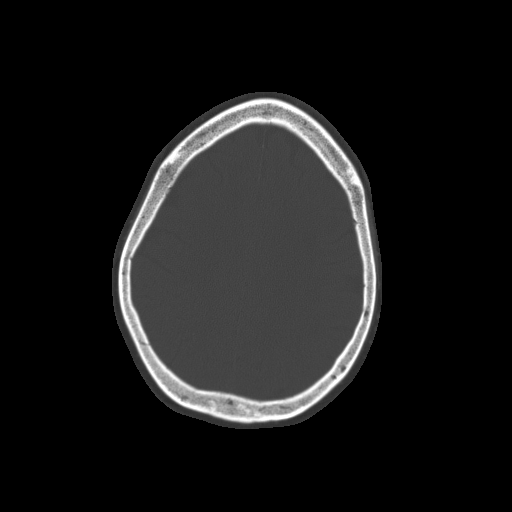
[im 44/56  bone]
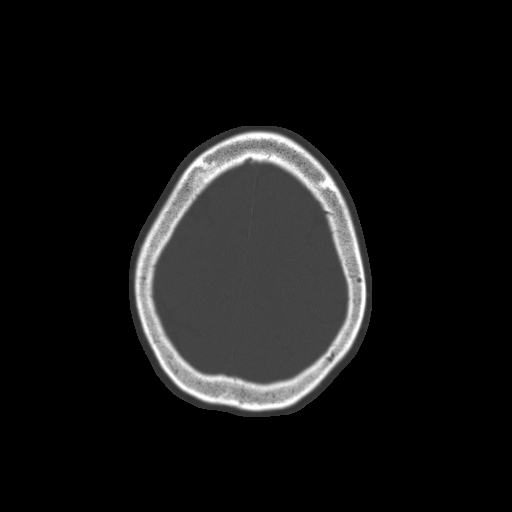
[im 50/56  bone]
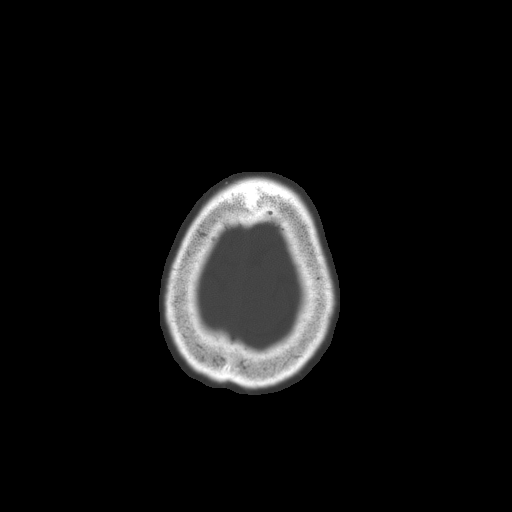

[Series 32: 3d filtered head w/o · axial · non-contrast · 0.49mm/px · z∈[+42,+150]mm · 8 of 28 slices shown, 10 images]
[im 4/28  brain]
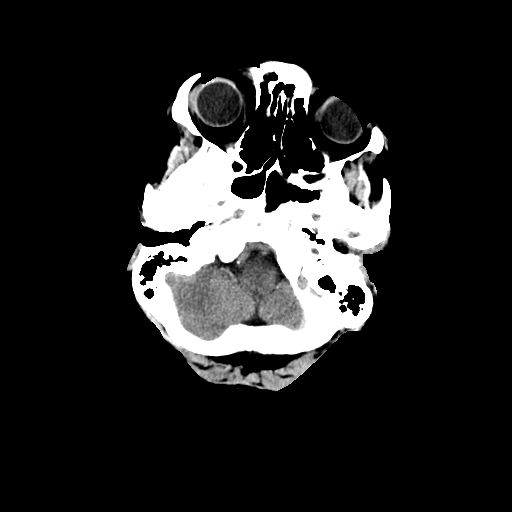
[im 4/28  bone]
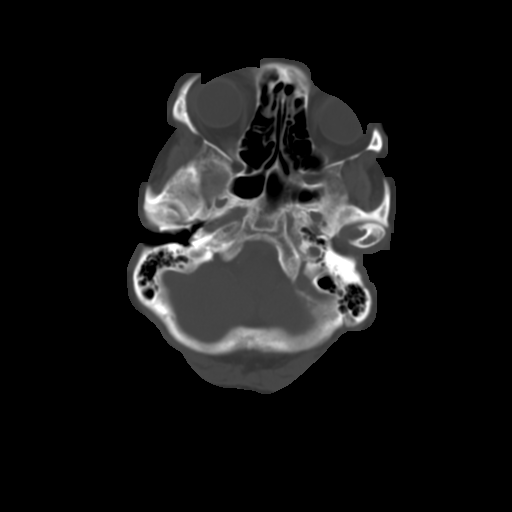
[im 7/28  brain]
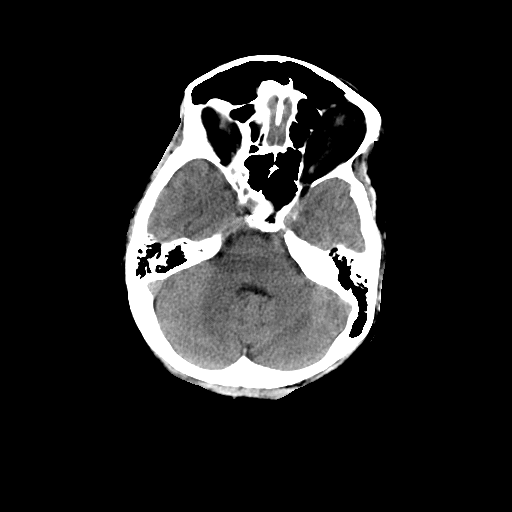
[im 10/28  brain]
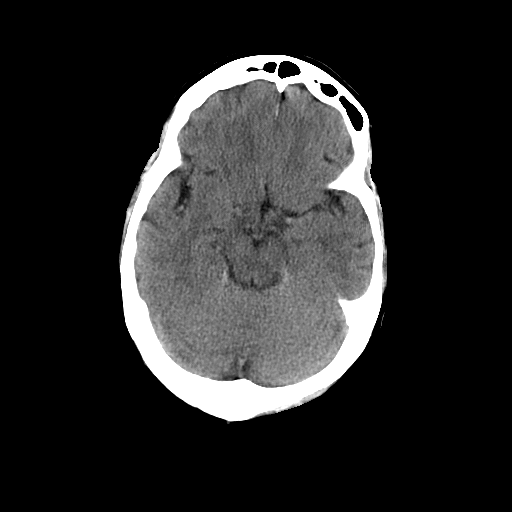
[im 13/28  brain]
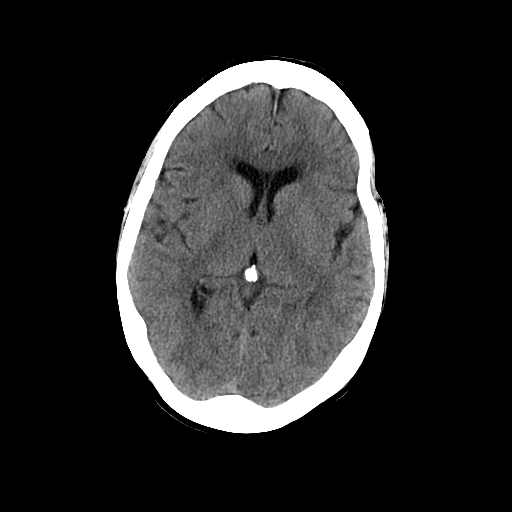
[im 16/28  brain]
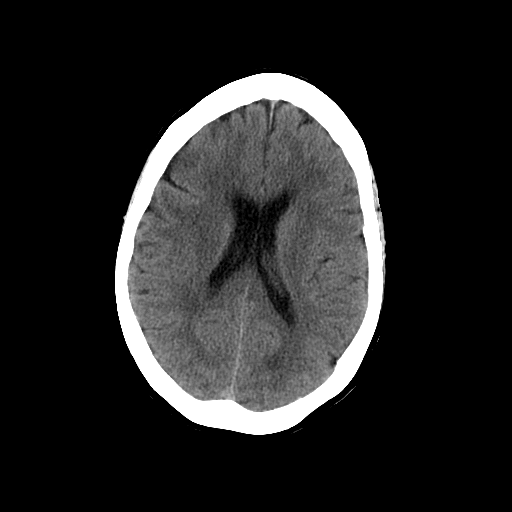
[im 16/28  bone]
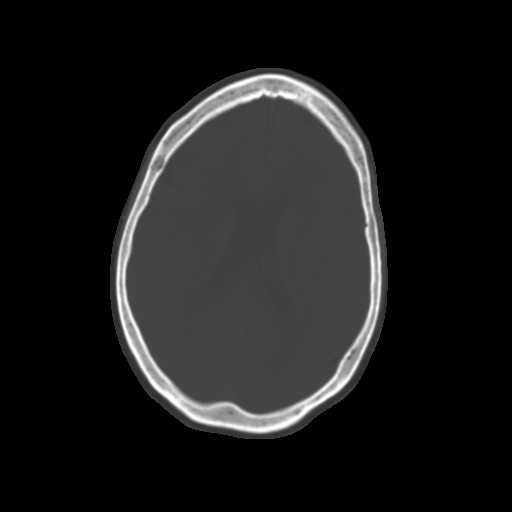
[im 19/28  brain]
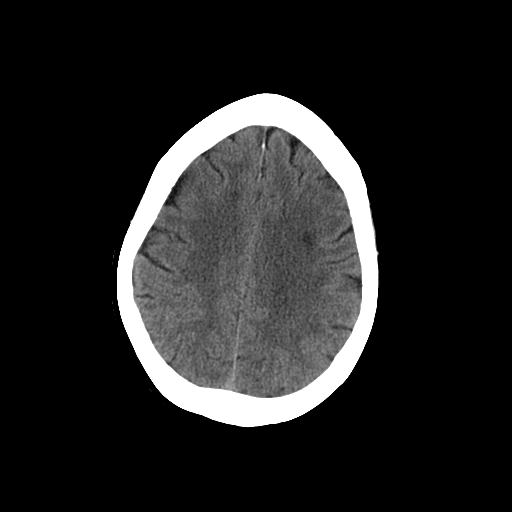
[im 22/28  brain]
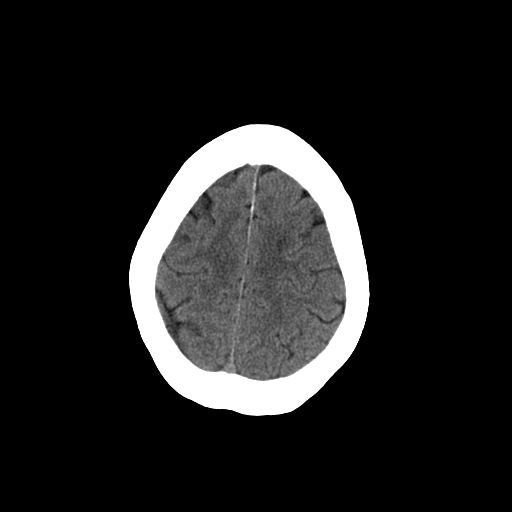
[im 25/28  brain]
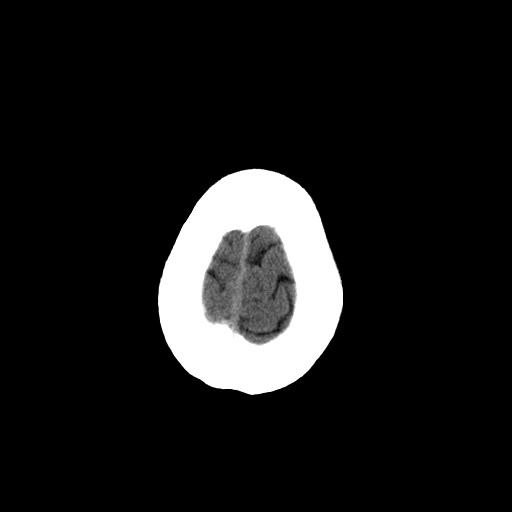

[16 of 30 positions shown; findings below may reference images not displayed]

FINDINGS: The ventricular system is normal in size and
configuration and the septum is midline position.  Only minimal
atrophy is present.  There is mild to moderate small vessel
ischemic change present within the periventricular white matter.
No hemorrhage, mass lesion, or acute infarction is seen.  On bone
window images, there is some mucosal thickening in the sphenoid
sinus and to a lesser degree in the ethmoid air cells consistent
with sinus disease.  No calvarial abnormality is seen.
IMPRESSION: 1.  Mild to moderate small vessel ischemic change.  No acute
intracranial abnormality.
2.  Mucosal thickening in the sphenoid sinus and to a lesser degree
in the ethmoid air cells consistent with sinus disease.

## 2013-03-07 ENCOUNTER — Ambulatory Visit (HOSPITAL_COMMUNITY)
Admission: RE | Admit: 2013-03-07 | Discharge: 2013-03-07 | Disposition: A | Discharge status: Home / Self Care | Payer: Medicare Other | Source: Ambulatory Visit | Attending: Urology | Admitting: Urology

## 2013-03-07 ENCOUNTER — Other Ambulatory Visit: Payer: Self-pay | Admitting: Urology

## 2013-03-07 DIAGNOSIS — N201 Calculus of ureter: Secondary | ICD-10-CM | POA: Diagnosis not present

## 2013-03-07 DIAGNOSIS — R935 Abnormal findings on diagnostic imaging of other abdominal regions, including retroperitoneum: Secondary | ICD-10-CM | POA: Diagnosis not present

## 2013-03-08 ENCOUNTER — Other Ambulatory Visit: Payer: Self-pay | Admitting: Urology

## 2013-03-08 ENCOUNTER — Ambulatory Visit (INDEPENDENT_AMBULATORY_CARE_PROVIDER_SITE_OTHER): Payer: Medicare Other | Admitting: Urology

## 2013-03-08 DIAGNOSIS — N201 Calculus of ureter: Secondary | ICD-10-CM | POA: Diagnosis not present

## 2013-03-08 DIAGNOSIS — K219 Gastro-esophageal reflux disease without esophagitis: Secondary | ICD-10-CM | POA: Diagnosis not present

## 2013-03-08 DIAGNOSIS — H8110 Benign paroxysmal vertigo, unspecified ear: Secondary | ICD-10-CM | POA: Diagnosis not present

## 2013-03-08 DIAGNOSIS — N200 Calculus of kidney: Secondary | ICD-10-CM | POA: Diagnosis not present

## 2013-03-08 DIAGNOSIS — R42 Dizziness and giddiness: Secondary | ICD-10-CM | POA: Diagnosis not present

## 2013-03-10 ENCOUNTER — Ambulatory Visit (HOSPITAL_COMMUNITY)
Admission: RE | Admit: 2013-03-10 | Discharge: 2013-03-10 | Disposition: A | Discharge status: Home / Self Care | Payer: Medicare Other | Source: Ambulatory Visit | Attending: Urology | Admitting: Urology

## 2013-03-10 DIAGNOSIS — N201 Calculus of ureter: Secondary | ICD-10-CM | POA: Diagnosis not present

## 2013-03-10 DIAGNOSIS — I700 Atherosclerosis of aorta: Secondary | ICD-10-CM | POA: Diagnosis not present

## 2013-03-10 DIAGNOSIS — D739 Disease of spleen, unspecified: Secondary | ICD-10-CM | POA: Diagnosis not present

## 2013-06-21 DIAGNOSIS — G2589 Other specified extrapyramidal and movement disorders: Secondary | ICD-10-CM | POA: Diagnosis not present

## 2013-06-21 DIAGNOSIS — Z79899 Other long term (current) drug therapy: Secondary | ICD-10-CM | POA: Diagnosis not present

## 2013-06-21 DIAGNOSIS — M899 Disorder of bone, unspecified: Secondary | ICD-10-CM | POA: Diagnosis not present

## 2013-06-21 DIAGNOSIS — Z23 Encounter for immunization: Secondary | ICD-10-CM | POA: Diagnosis not present

## 2013-06-21 DIAGNOSIS — E7800 Pure hypercholesterolemia, unspecified: Secondary | ICD-10-CM | POA: Diagnosis not present

## 2013-06-21 DIAGNOSIS — G479 Sleep disorder, unspecified: Secondary | ICD-10-CM | POA: Diagnosis not present

## 2013-06-21 DIAGNOSIS — K219 Gastro-esophageal reflux disease without esophagitis: Secondary | ICD-10-CM | POA: Diagnosis not present

## 2013-06-21 DIAGNOSIS — I10 Essential (primary) hypertension: Secondary | ICD-10-CM | POA: Diagnosis not present

## 2013-06-21 DIAGNOSIS — M159 Polyosteoarthritis, unspecified: Secondary | ICD-10-CM | POA: Diagnosis not present

## 2013-08-11 DIAGNOSIS — N200 Calculus of kidney: Secondary | ICD-10-CM | POA: Diagnosis not present

## 2013-09-29 DIAGNOSIS — S93609A Unspecified sprain of unspecified foot, initial encounter: Secondary | ICD-10-CM | POA: Diagnosis not present

## 2013-09-29 DIAGNOSIS — G4700 Insomnia, unspecified: Secondary | ICD-10-CM | POA: Diagnosis not present

## 2013-10-06 HISTORY — PX: COLONOSCOPY: SHX174

## 2013-10-14 DIAGNOSIS — S93409A Sprain of unspecified ligament of unspecified ankle, initial encounter: Secondary | ICD-10-CM | POA: Diagnosis not present

## 2013-10-14 DIAGNOSIS — F430 Acute stress reaction: Secondary | ICD-10-CM | POA: Diagnosis not present

## 2013-10-14 DIAGNOSIS — G479 Sleep disorder, unspecified: Secondary | ICD-10-CM | POA: Diagnosis not present

## 2013-12-19 DIAGNOSIS — M949 Disorder of cartilage, unspecified: Secondary | ICD-10-CM | POA: Diagnosis not present

## 2013-12-19 DIAGNOSIS — M159 Polyosteoarthritis, unspecified: Secondary | ICD-10-CM | POA: Diagnosis not present

## 2013-12-19 DIAGNOSIS — K219 Gastro-esophageal reflux disease without esophagitis: Secondary | ICD-10-CM | POA: Diagnosis not present

## 2013-12-19 DIAGNOSIS — G2589 Other specified extrapyramidal and movement disorders: Secondary | ICD-10-CM | POA: Diagnosis not present

## 2013-12-19 DIAGNOSIS — G479 Sleep disorder, unspecified: Secondary | ICD-10-CM | POA: Diagnosis not present

## 2013-12-19 DIAGNOSIS — I10 Essential (primary) hypertension: Secondary | ICD-10-CM | POA: Diagnosis not present

## 2013-12-19 DIAGNOSIS — M899 Disorder of bone, unspecified: Secondary | ICD-10-CM | POA: Diagnosis not present

## 2013-12-19 DIAGNOSIS — E7800 Pure hypercholesterolemia, unspecified: Secondary | ICD-10-CM | POA: Diagnosis not present

## 2013-12-19 DIAGNOSIS — Z79899 Other long term (current) drug therapy: Secondary | ICD-10-CM | POA: Diagnosis not present

## 2013-12-29 DIAGNOSIS — Z961 Presence of intraocular lens: Secondary | ICD-10-CM | POA: Diagnosis not present

## 2014-03-16 DIAGNOSIS — Z1211 Encounter for screening for malignant neoplasm of colon: Secondary | ICD-10-CM | POA: Diagnosis not present

## 2014-03-16 DIAGNOSIS — K219 Gastro-esophageal reflux disease without esophagitis: Secondary | ICD-10-CM | POA: Diagnosis not present

## 2014-03-28 ENCOUNTER — Encounter: Payer: Self-pay | Admitting: *Deleted

## 2014-05-03 DIAGNOSIS — K5730 Diverticulosis of large intestine without perforation or abscess without bleeding: Secondary | ICD-10-CM | POA: Diagnosis not present

## 2014-05-03 DIAGNOSIS — K648 Other hemorrhoids: Secondary | ICD-10-CM | POA: Diagnosis not present

## 2014-05-03 DIAGNOSIS — Z1211 Encounter for screening for malignant neoplasm of colon: Secondary | ICD-10-CM | POA: Diagnosis not present

## 2014-05-04 IMAGING — CR DG ABDOMEN 1V
2 series · 2 of 2 positions shown · non-contrast
Comparison: CT 11/24/2012

CLINICAL DATA: Ureteral stone

ABDOMEN - 1 VIEW

[view not recorded (1 of 2)]
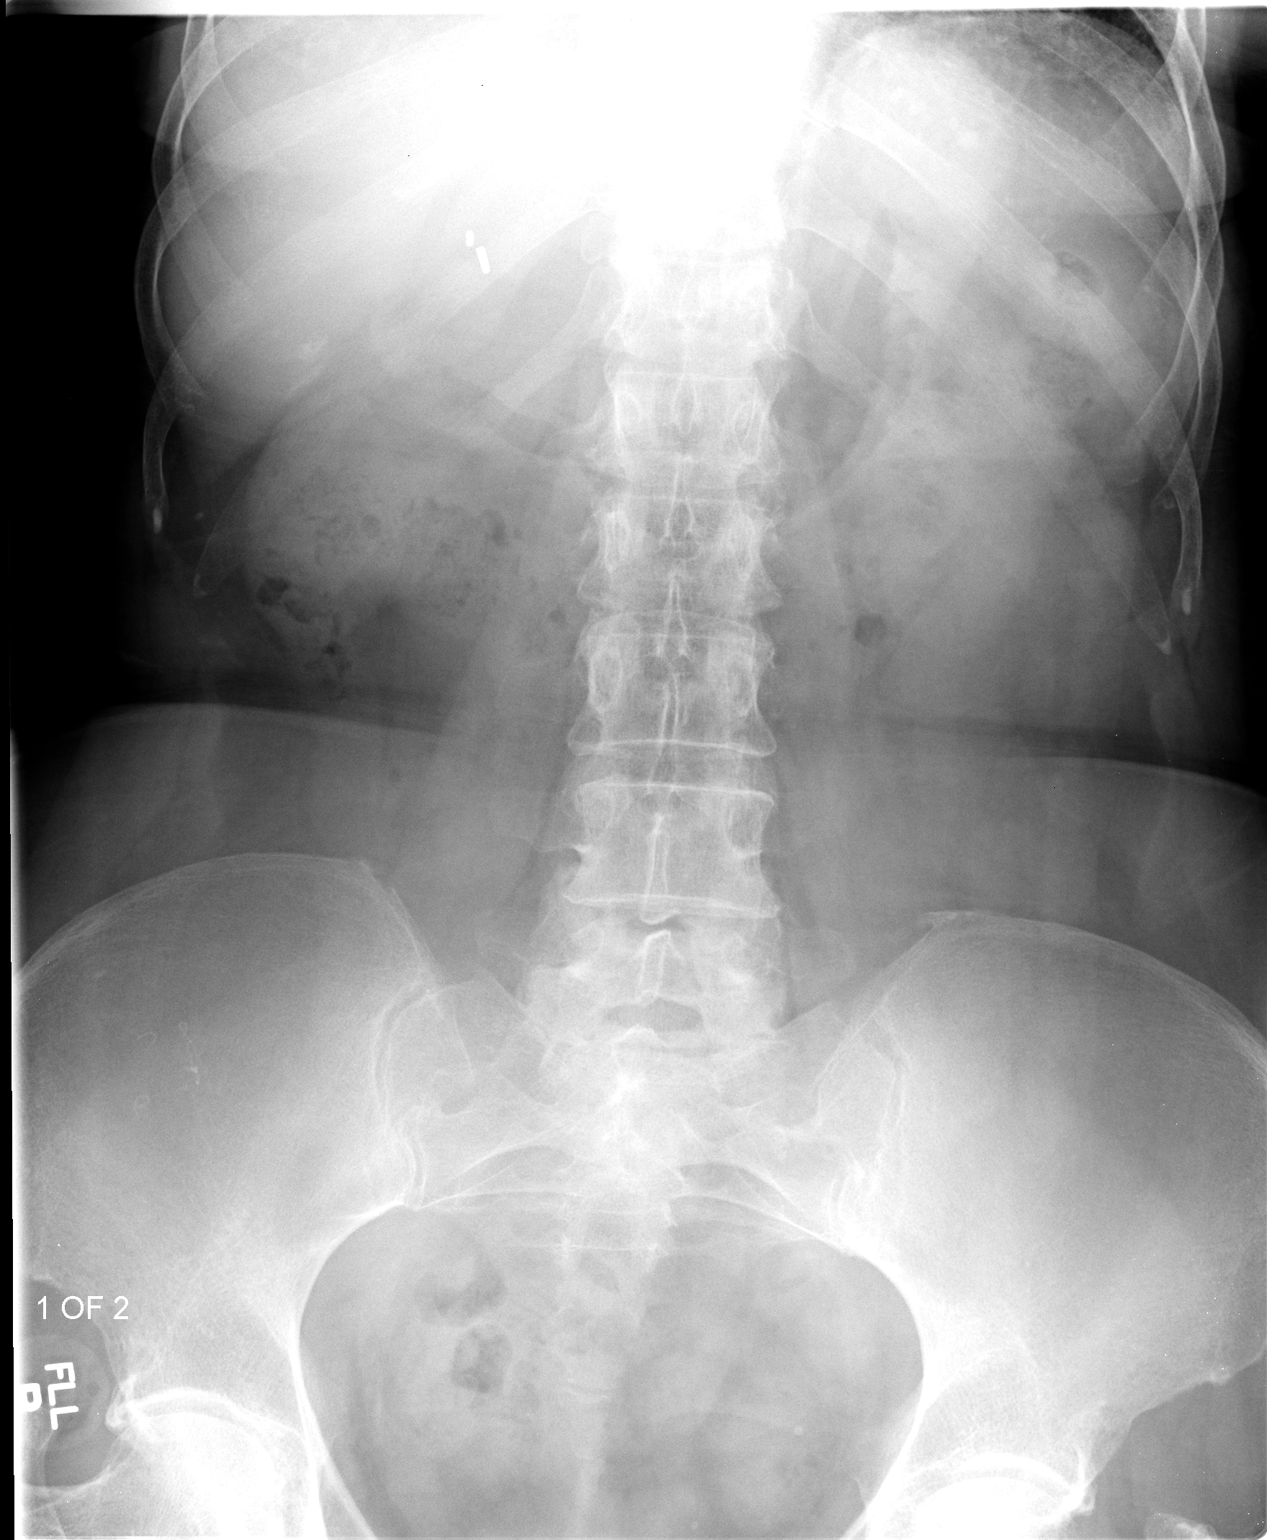

[view not recorded (2 of 2)]
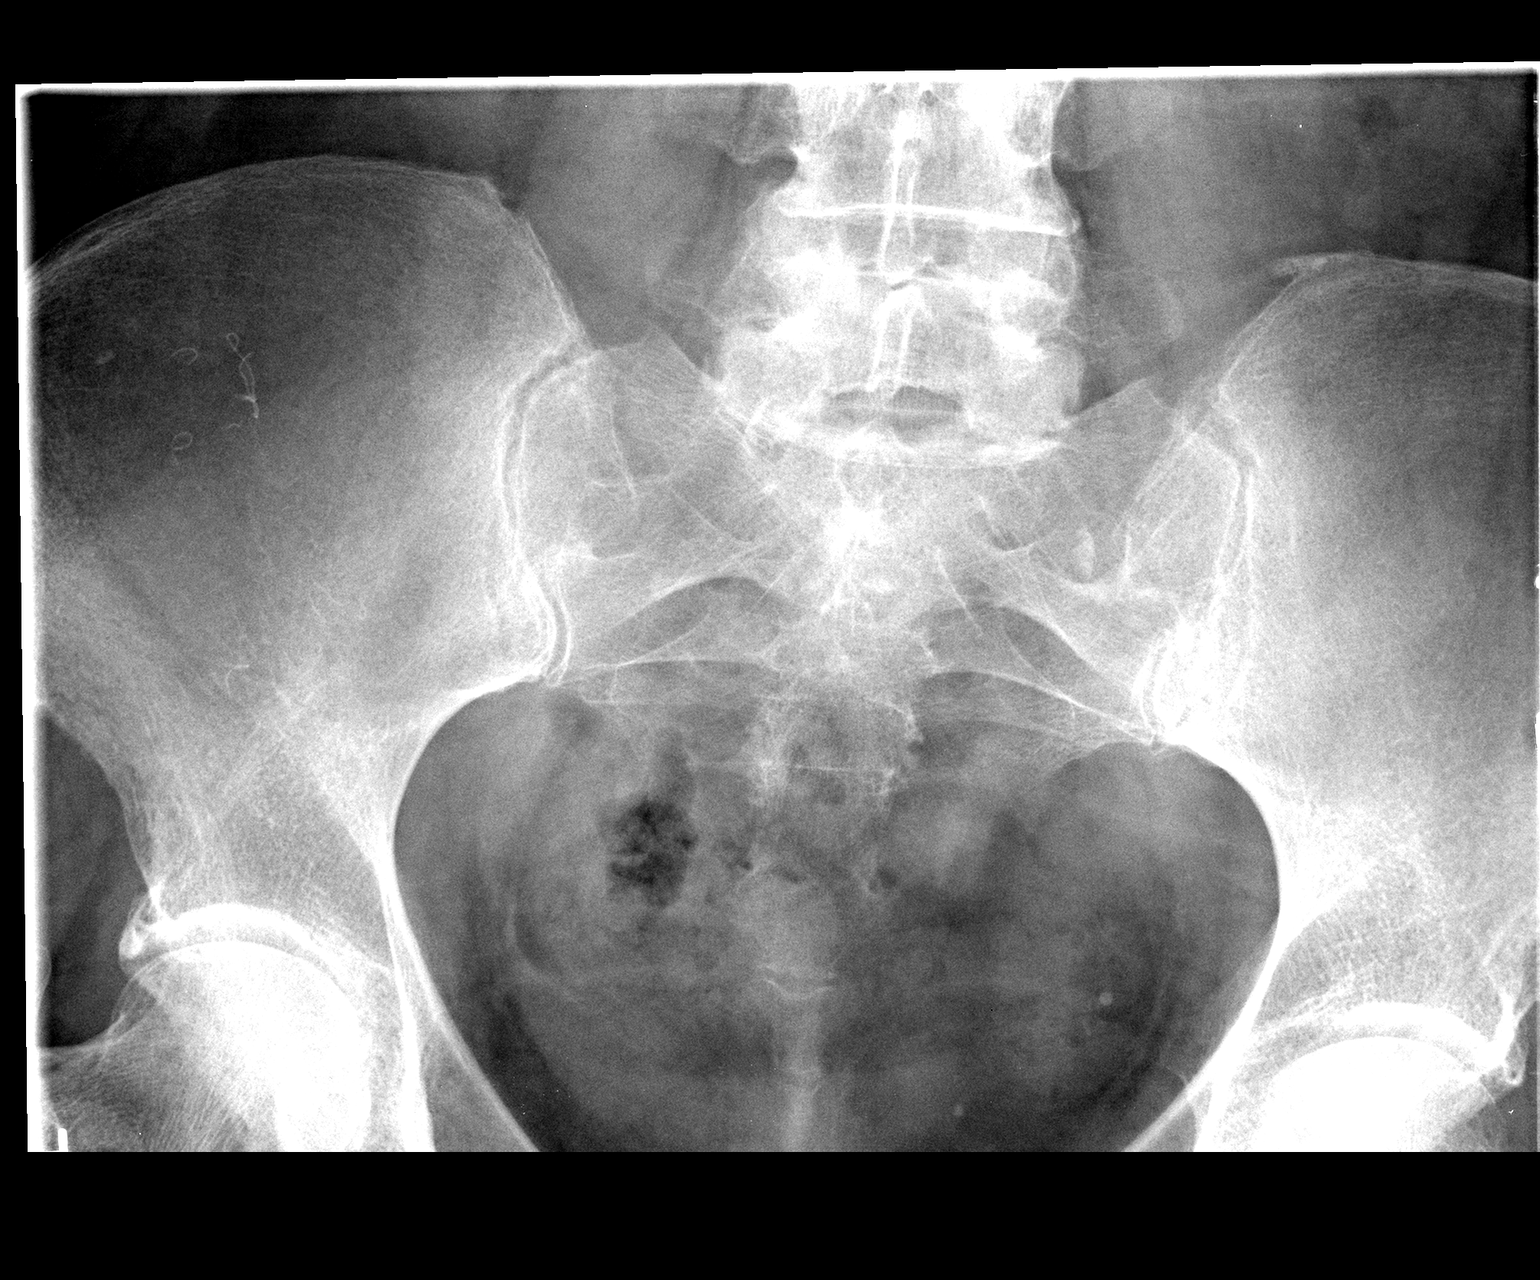

[2 of 2 positions shown; findings below may reference images not displayed]

FINDINGS: The mid left ureteral stones seen on comparison CT is not
evident within the course the left ureter.  No evidence of bladder
calculi.  There are two rounded calcifications in the left lower
pelvis which are likely vascular calcifications.  No evidence of
nephrolithiasis .
IMPRESSION: Left ureterolithiasis is not identified.

## 2014-06-21 DIAGNOSIS — G2589 Other specified extrapyramidal and movement disorders: Secondary | ICD-10-CM | POA: Diagnosis not present

## 2014-06-21 DIAGNOSIS — Z23 Encounter for immunization: Secondary | ICD-10-CM | POA: Diagnosis not present

## 2014-06-21 DIAGNOSIS — E7800 Pure hypercholesterolemia, unspecified: Secondary | ICD-10-CM | POA: Diagnosis not present

## 2014-06-21 DIAGNOSIS — K219 Gastro-esophageal reflux disease without esophagitis: Secondary | ICD-10-CM | POA: Diagnosis not present

## 2014-06-21 DIAGNOSIS — M949 Disorder of cartilage, unspecified: Secondary | ICD-10-CM | POA: Diagnosis not present

## 2014-06-21 DIAGNOSIS — G479 Sleep disorder, unspecified: Secondary | ICD-10-CM | POA: Diagnosis not present

## 2014-06-21 DIAGNOSIS — M899 Disorder of bone, unspecified: Secondary | ICD-10-CM | POA: Diagnosis not present

## 2014-06-21 DIAGNOSIS — I10 Essential (primary) hypertension: Secondary | ICD-10-CM | POA: Diagnosis not present

## 2014-06-21 DIAGNOSIS — M159 Polyosteoarthritis, unspecified: Secondary | ICD-10-CM | POA: Diagnosis not present

## 2014-08-01 DIAGNOSIS — Z1231 Encounter for screening mammogram for malignant neoplasm of breast: Secondary | ICD-10-CM | POA: Diagnosis not present

## 2014-11-15 DIAGNOSIS — R1110 Vomiting, unspecified: Secondary | ICD-10-CM | POA: Diagnosis not present

## 2014-11-15 DIAGNOSIS — R197 Diarrhea, unspecified: Secondary | ICD-10-CM | POA: Diagnosis not present

## 2014-12-05 DIAGNOSIS — B349 Viral infection, unspecified: Secondary | ICD-10-CM | POA: Diagnosis not present

## 2014-12-13 DIAGNOSIS — Z961 Presence of intraocular lens: Secondary | ICD-10-CM | POA: Diagnosis not present

## 2014-12-13 DIAGNOSIS — H04123 Dry eye syndrome of bilateral lacrimal glands: Secondary | ICD-10-CM | POA: Diagnosis not present

## 2014-12-25 DIAGNOSIS — E780 Pure hypercholesterolemia: Secondary | ICD-10-CM | POA: Diagnosis not present

## 2014-12-25 DIAGNOSIS — M1990 Unspecified osteoarthritis, unspecified site: Secondary | ICD-10-CM | POA: Diagnosis not present

## 2014-12-25 DIAGNOSIS — F411 Generalized anxiety disorder: Secondary | ICD-10-CM | POA: Diagnosis not present

## 2014-12-25 DIAGNOSIS — G479 Sleep disorder, unspecified: Secondary | ICD-10-CM | POA: Diagnosis not present

## 2014-12-25 DIAGNOSIS — K219 Gastro-esophageal reflux disease without esophagitis: Secondary | ICD-10-CM | POA: Diagnosis not present

## 2014-12-25 DIAGNOSIS — G2581 Restless legs syndrome: Secondary | ICD-10-CM | POA: Diagnosis not present

## 2014-12-25 DIAGNOSIS — I10 Essential (primary) hypertension: Secondary | ICD-10-CM | POA: Diagnosis not present

## 2014-12-25 DIAGNOSIS — J309 Allergic rhinitis, unspecified: Secondary | ICD-10-CM | POA: Diagnosis not present

## 2014-12-25 DIAGNOSIS — M899 Disorder of bone, unspecified: Secondary | ICD-10-CM | POA: Diagnosis not present

## 2015-06-28 DIAGNOSIS — K219 Gastro-esophageal reflux disease without esophagitis: Secondary | ICD-10-CM | POA: Diagnosis not present

## 2015-06-28 DIAGNOSIS — M1990 Unspecified osteoarthritis, unspecified site: Secondary | ICD-10-CM | POA: Diagnosis not present

## 2015-06-28 DIAGNOSIS — J309 Allergic rhinitis, unspecified: Secondary | ICD-10-CM | POA: Diagnosis not present

## 2015-06-28 DIAGNOSIS — G2581 Restless legs syndrome: Secondary | ICD-10-CM | POA: Diagnosis not present

## 2015-06-28 DIAGNOSIS — Z1389 Encounter for screening for other disorder: Secondary | ICD-10-CM | POA: Diagnosis not present

## 2015-06-28 DIAGNOSIS — I10 Essential (primary) hypertension: Secondary | ICD-10-CM | POA: Diagnosis not present

## 2015-06-28 DIAGNOSIS — F411 Generalized anxiety disorder: Secondary | ICD-10-CM | POA: Diagnosis not present

## 2015-06-28 DIAGNOSIS — M899 Disorder of bone, unspecified: Secondary | ICD-10-CM | POA: Diagnosis not present

## 2015-06-28 DIAGNOSIS — Z23 Encounter for immunization: Secondary | ICD-10-CM | POA: Diagnosis not present

## 2015-06-28 DIAGNOSIS — E780 Pure hypercholesterolemia: Secondary | ICD-10-CM | POA: Diagnosis not present

## 2015-06-28 DIAGNOSIS — G479 Sleep disorder, unspecified: Secondary | ICD-10-CM | POA: Diagnosis not present

## 2015-08-07 DIAGNOSIS — M859 Disorder of bone density and structure, unspecified: Secondary | ICD-10-CM | POA: Diagnosis not present

## 2015-08-07 DIAGNOSIS — Z1231 Encounter for screening mammogram for malignant neoplasm of breast: Secondary | ICD-10-CM | POA: Diagnosis not present

## 2015-08-07 DIAGNOSIS — M8589 Other specified disorders of bone density and structure, multiple sites: Secondary | ICD-10-CM | POA: Diagnosis not present

## 2015-08-09 DIAGNOSIS — L255 Unspecified contact dermatitis due to plants, except food: Secondary | ICD-10-CM | POA: Diagnosis not present

## 2015-10-06 DIAGNOSIS — J019 Acute sinusitis, unspecified: Secondary | ICD-10-CM | POA: Diagnosis not present

## 2015-11-06 DIAGNOSIS — M7072 Other bursitis of hip, left hip: Secondary | ICD-10-CM | POA: Diagnosis not present

## 2015-12-26 DIAGNOSIS — Z961 Presence of intraocular lens: Secondary | ICD-10-CM | POA: Diagnosis not present

## 2015-12-26 DIAGNOSIS — I10 Essential (primary) hypertension: Secondary | ICD-10-CM | POA: Diagnosis not present

## 2015-12-26 DIAGNOSIS — H35033 Hypertensive retinopathy, bilateral: Secondary | ICD-10-CM | POA: Diagnosis not present

## 2015-12-26 DIAGNOSIS — H524 Presbyopia: Secondary | ICD-10-CM | POA: Diagnosis not present

## 2015-12-26 DIAGNOSIS — H26493 Other secondary cataract, bilateral: Secondary | ICD-10-CM | POA: Diagnosis not present

## 2015-12-26 DIAGNOSIS — H52223 Regular astigmatism, bilateral: Secondary | ICD-10-CM | POA: Diagnosis not present

## 2015-12-27 ENCOUNTER — Other Ambulatory Visit: Payer: Self-pay | Admitting: Family Medicine

## 2015-12-27 DIAGNOSIS — E7800 Pure hypercholesterolemia, unspecified: Secondary | ICD-10-CM | POA: Diagnosis not present

## 2015-12-27 DIAGNOSIS — G2581 Restless legs syndrome: Secondary | ICD-10-CM | POA: Diagnosis not present

## 2015-12-27 DIAGNOSIS — R42 Dizziness and giddiness: Secondary | ICD-10-CM

## 2015-12-27 DIAGNOSIS — K219 Gastro-esophageal reflux disease without esophagitis: Secondary | ICD-10-CM | POA: Diagnosis not present

## 2015-12-27 DIAGNOSIS — F411 Generalized anxiety disorder: Secondary | ICD-10-CM | POA: Diagnosis not present

## 2015-12-27 DIAGNOSIS — G479 Sleep disorder, unspecified: Secondary | ICD-10-CM | POA: Diagnosis not present

## 2015-12-27 DIAGNOSIS — J309 Allergic rhinitis, unspecified: Secondary | ICD-10-CM | POA: Diagnosis not present

## 2015-12-27 DIAGNOSIS — M899 Disorder of bone, unspecified: Secondary | ICD-10-CM | POA: Diagnosis not present

## 2015-12-27 DIAGNOSIS — I10 Essential (primary) hypertension: Secondary | ICD-10-CM | POA: Diagnosis not present

## 2015-12-27 DIAGNOSIS — M1990 Unspecified osteoarthritis, unspecified site: Secondary | ICD-10-CM | POA: Diagnosis not present

## 2016-01-01 ENCOUNTER — Ambulatory Visit
Admission: RE | Admit: 2016-01-01 | Discharge: 2016-01-01 | Disposition: A | Discharge status: Home / Self Care | Payer: Medicare Other | Source: Ambulatory Visit | Attending: Family Medicine | Admitting: Family Medicine

## 2016-01-01 DIAGNOSIS — I6523 Occlusion and stenosis of bilateral carotid arteries: Secondary | ICD-10-CM | POA: Diagnosis not present

## 2016-01-01 DIAGNOSIS — R42 Dizziness and giddiness: Secondary | ICD-10-CM

## 2016-01-03 ENCOUNTER — Other Ambulatory Visit: Payer: Self-pay | Admitting: Family Medicine

## 2016-01-03 DIAGNOSIS — E041 Nontoxic single thyroid nodule: Secondary | ICD-10-CM

## 2016-01-08 ENCOUNTER — Other Ambulatory Visit: Payer: Medicare Other

## 2016-01-10 ENCOUNTER — Ambulatory Visit
Admission: RE | Admit: 2016-01-10 | Discharge: 2016-01-10 | Disposition: A | Discharge status: Home / Self Care | Payer: Medicare Other | Source: Ambulatory Visit | Attending: Family Medicine | Admitting: Family Medicine

## 2016-01-10 DIAGNOSIS — E041 Nontoxic single thyroid nodule: Secondary | ICD-10-CM

## 2016-01-10 DIAGNOSIS — E042 Nontoxic multinodular goiter: Secondary | ICD-10-CM | POA: Diagnosis not present

## 2016-01-15 ENCOUNTER — Other Ambulatory Visit: Payer: Self-pay | Admitting: Family Medicine

## 2016-01-15 DIAGNOSIS — M79606 Pain in leg, unspecified: Secondary | ICD-10-CM | POA: Diagnosis not present

## 2016-01-15 DIAGNOSIS — E041 Nontoxic single thyroid nodule: Secondary | ICD-10-CM

## 2016-01-23 ENCOUNTER — Other Ambulatory Visit (HOSPITAL_COMMUNITY)
Admission: RE | Admit: 2016-01-23 | Discharge: 2016-01-23 | Disposition: A | Discharge status: Home / Self Care | Payer: Medicare Other | Source: Ambulatory Visit | Attending: General Surgery | Admitting: General Surgery

## 2016-01-23 ENCOUNTER — Ambulatory Visit
Admission: RE | Admit: 2016-01-23 | Discharge: 2016-01-23 | Disposition: A | Discharge status: Home / Self Care | Payer: Medicare Other | Source: Ambulatory Visit | Attending: Family Medicine | Admitting: Family Medicine

## 2016-01-23 DIAGNOSIS — E041 Nontoxic single thyroid nodule: Secondary | ICD-10-CM

## 2016-02-10 DIAGNOSIS — L255 Unspecified contact dermatitis due to plants, except food: Secondary | ICD-10-CM | POA: Diagnosis not present

## 2016-02-10 DIAGNOSIS — M545 Low back pain: Secondary | ICD-10-CM | POA: Diagnosis not present

## 2016-03-10 DIAGNOSIS — G2581 Restless legs syndrome: Secondary | ICD-10-CM | POA: Diagnosis not present

## 2016-03-10 DIAGNOSIS — M79605 Pain in left leg: Secondary | ICD-10-CM | POA: Diagnosis not present

## 2016-03-10 DIAGNOSIS — M7062 Trochanteric bursitis, left hip: Secondary | ICD-10-CM | POA: Diagnosis not present

## 2016-03-28 ENCOUNTER — Other Ambulatory Visit (INDEPENDENT_AMBULATORY_CARE_PROVIDER_SITE_OTHER): Payer: Medicare Other

## 2016-03-28 ENCOUNTER — Encounter: Payer: Self-pay | Admitting: Neurology

## 2016-03-28 ENCOUNTER — Ambulatory Visit (INDEPENDENT_AMBULATORY_CARE_PROVIDER_SITE_OTHER): Payer: Medicare Other | Admitting: Neurology

## 2016-03-28 VITALS — BP 130/70 | HR 58 | Ht 62.0 in | Wt 148.1 lb

## 2016-03-28 DIAGNOSIS — G2581 Restless legs syndrome: Secondary | ICD-10-CM

## 2016-03-28 DIAGNOSIS — T50905A Adverse effect of unspecified drugs, medicaments and biological substances, initial encounter: Secondary | ICD-10-CM

## 2016-03-28 HISTORY — DX: Restless legs syndrome: G25.81

## 2016-03-28 LAB — FERRITIN: Ferritin: 83.7 ng/mL (ref 10.0–291.0)

## 2016-03-28 MED ORDER — GABAPENTIN 300 MG PO CAPS: ORAL_CAPSULE | ORAL | Status: DC

## 2016-03-28 NOTE — Patient Instructions (Addendum)
1.  Reduce ropinorole by 1 tablet every 2-3 days.  2.  Start gabapentin 300mg  at bedtime for 3 days, then increase to 1 tablet twice daily.  3.  Check ferritin   Return to clinic in 3 months

## 2016-03-28 NOTE — Progress Notes (Signed)
[facility] Clinic Note - Initial Visit   Date: 03/28/2016  Alicia [name] MRN: [id number] DOB: August 03, 1941   Dear Dr. Marisue Humble:  Thank you for your kind referral of Alicia [name] for consultation of RLS. Although her history is well known to you, please allow [place] to reiterate it for the purpose of our medical record. The patient was accompanied to the clinic by self.    History of Present Illness: Alicia [name] is a 75 y.o. right-handed Caucasian female with hypertension, GERD, hyperlipidemia, RLS, and insomia presenting for evaluation of worsening leg pain.    She was diagnosed with RLS around early 2000s.  She has dull and deep ache involving the lower legs which is worse at rest such as driving or riding on plane for a long time and at nighttime.  The discomfort is improved with activity such as walking for 15-20 minutes.  She was started on ropinorole about 10 years ago and increased the dose about 4 years ago because symptoms started occuring sooner in the evening. Over the past year, she started having worsening discomfort and recently she began taking 1mg  TID without any added benefit, and in fact, feels that the discomfort is worsening. No numbness/tingling, weakness, or imbalance.   Out-side paper records, electronic medical record, and images have been reviewed where available and summarized as:  Labs 12/29/2015:  CMP normal  Past Medical History  Diagnosis Date  . Hypertension   . Hypercholesteremia   . GERD (gastroesophageal reflux disease)   . Restless leg syndrome   . Osteoarthritis   . Osteopenia   . Insomnia   . Kidney stone   . Anxiety     Past Surgical History  Procedure Laterality Date  . Abdominal hysterectomy    . Cholecystectomy    . Appendectomy    . Cataract extraction Right 02/2010     Medications:  Outpatient Encounter Prescriptions as of 03/28/2016  Medication Sig Note  . amLODipine (NORVASC) 5 MG tablet Take 2.5 mg by  mouth daily.   . calcium-vitamin D (OSCAL WITH D) 500-200 MG-UNIT per tablet Take 1 tablet by mouth daily.   Marland Kitchen doxepin (SINEQUAN) 10 MG capsule Take 20 mg by mouth at bedtime.   Marland Kitchen glucosamine-chondroitin 500-400 MG tablet Take 1 tablet by mouth 3 (three) times daily.   Marland Kitchen LORazepam (ATIVAN) 0.5 MG tablet  03/28/2016: Received from: External Pharmacy  . losartan (COZAAR) 25 MG tablet Take 25 mg by mouth daily.   . meloxicam (MOBIC) 15 MG tablet Take 15 mg by mouth daily.   . metoprolol tartrate (LOPRESSOR) 25 MG tablet Take 12.5 mg by mouth 2 (two) times daily.   . multivitamin-lutein (OCUVITE-LUTEIN) CAPS Take 1 capsule by mouth daily.   . OMEGA 3 1000 MG CAPS Take 1 capsule by mouth daily.   Marland Kitchen omeprazole (PRILOSEC) 40 MG capsule  03/28/2016: Received from: External Pharmacy  . simvastatin (ZOCOR) 20 MG tablet  03/28/2016: Received from: External Pharmacy  . [DISCONTINUED] ropinirole (REQUIP) 5 MG tablet Take 10 mg by mouth at bedtime.   . gabapentin (NEURONTIN) 300 MG capsule Take 1 tablet at bedtime x 3 days, then increase to 1 tablet twice daily.   . [DISCONTINUED] diclofenac (VOLTAREN) 75 MG EC tablet  03/28/2016: Received from: External Pharmacy  . [DISCONTINUED] HYDROcodone-acetaminophen (NORCO/VICODIN) 5-325 MG tablet  03/28/2016: Received from: External Pharmacy  . [DISCONTINUED] meclizine (ANTIVERT) 25 MG tablet  03/28/2016: Received from: External Pharmacy  . [DISCONTINUED] omeprazole (PRILOSEC) 20  MG capsule Take 20 mg by mouth daily.   . [DISCONTINUED] oxyCODONE-acetaminophen (PERCOCET/ROXICET) 5-325 MG per tablet Take 1 tablet by mouth every 4 (four) hours as needed for pain.   . [DISCONTINUED] Tamsulosin HCl (FLOMAX) 0.4 MG CAPS Take 1 capsule (0.4 mg total) by mouth daily.   . [DISCONTINUED] tiZANidine (ZANAFLEX) 2 MG tablet  03/28/2016: Received from: External Pharmacy   No facility-administered encounter medications on file as of 03/28/2016.     Allergies:  Allergies  Allergen  Reactions  . Ibuprofen     GI UPSET  . Lexapro [Escitalopram Oxalate]     GERD  . Lisinopril Cough  . Mobic [Meloxicam]     LEG PAIN  . Nabumetone     GI UPSET  . Prilosec Otc [Omeprazole Magnesium]     ARM SPASM  . Zoloft [Sertraline Hcl]     INSOMNIA  . Sulfa Antibiotics Rash    Family History: Family History  Problem Relation Age of Onset  . Heart attack Mother   . Heart attack Father   . COPD Sister   . COPD Brother   . COPD Brother   . COPD Sister     Social History: Social History  Substance Use Topics  . Smoking status: Former Smoker    Quit date: 10/06/1996  . Smokeless tobacco: Never Used  . Alcohol Use: No   Social History   Social History Narrative   Lives alone in a one story home.  Has 3 children.     Semi retired.     Worked in [place] for [employer].  Education: high school.    Review of Systems:  CONSTITUTIONAL: No fevers, chills, night sweats, or weight loss.   EYES: No visual changes or eye pain ENT: No hearing changes.  No history of nose bleeds.   RESPIRATORY: No cough, wheezing and shortness of breath.   CARDIOVASCULAR: Negative for chest pain, and palpitations.   GI: Negative for abdominal discomfort, blood in stools or black stools.  No recent change in bowel habits.   GU:  No history of incontinence.   MUSCLOSKELETAL: No history of joint pain or swelling.  No myalgias.   SKIN: Negative for lesions, rash, and itching.   HEMATOLOGY/ONCOLOGY: Negative for prolonged bleeding, bruising easily, and swollen nodes.  No history of cancer.   ENDOCRINE: Negative for cold or heat intolerance, polydipsia or goiter.   PSYCH:  No depression or anxiety symptoms.   NEURO: As Above.   Vital Signs:  BP 130/70 mmHg  Pulse 58  Ht 5\' 2"  (1.575 m)  Wt 148 lb 2 oz (67.189 kg)  BMI 27.09 kg/m2  SpO2 98%   General Medical Exam:   General:  Well appearing, comfortable.   Eyes/ENT: see cranial nerve examination.   Neck: No masses appreciated.  Full range  of motion without tenderness.  No carotid bruits. Respiratory:  Clear to auscultation, good air entry bilaterally.   Cardiac:  Regular rate and rhythm, no murmur.   Extremities:  No deformities, edema, or skin discoloration.  Skin:  No rashes or lesions.  Neurological Exam: MENTAL STATUS including orientation to time, place, person, recent and remote memory, attention span and concentration, language, and fund of knowledge is normal.  Speech is not dysarthric.  CRANIAL NERVES: II:  No visual field defects.  Unremarkable fundi.   III-IV-VI: Pupils equal round and reactive to light.  Normal conjugate, extra-ocular eye movements in all directions of gaze.  No nystagmus.  No ptosis.  V:  Normal facial sensation.     VII:  Normal facial symmetry and movements.  VIII:  Normal hearing and vestibular function.   IX-X:  Normal palatal movement.   XI:  Normal shoulder shrug and head rotation.   XII:  Normal tongue strength and range of motion, no deviation or fasciculation.  MOTOR: Motor strength is 5/5 throughout. No atrophy, fasciculations or abnormal movements.  No pronator drift.  Tone is normal.    MSRs: Reflexes are 2+/4 throughout.  Downgoing plantar responses.  SENSORY:  Normal and symmetric perception of light touch, vibration, and proprioception.     COORDINATION/GAIT: Normal finger to nose.   Intact rapid alternating movements bilaterally.  Gait narrow based and stable. Tandem and stressed gait intact.    IMPRESSION: Restless leg syndrome managed with ropinorole for at least > 10 years, now with signs of worsening discomfort despite increasing the dose and frequency of the dopamine agonist.  Symptoms are most likely due to augmentation which is known side effect of prolonged dopamine agonist use.  I will taper her off ropinorole and start gabapentin 300mg  at bedtime and titrate to 300mg  twice daily.  I will aim for a dopaminergic drug-free period of at least 3 months and reassess.  For  completeness, a ferritin level will be checked.  Return to clinic in 3 months  The duration of this appointment visit was 40 minutes of face-to-face time with the patient.  Greater than 50% of this time was spent in counseling, explanation of diagnosis, planning of further management, and coordination of care.   Thank you for allowing me to participate in patient's care.  If I can answer any additional questions, I would be pleased to do so.    Sincerely,    Erza Mothershead K. Posey Pronto, DO

## 2016-03-28 NOTE — Progress Notes (Signed)
Note routed

## 2016-05-01 DIAGNOSIS — B349 Viral infection, unspecified: Secondary | ICD-10-CM | POA: Diagnosis not present

## 2016-06-30 DIAGNOSIS — Z1389 Encounter for screening for other disorder: Secondary | ICD-10-CM | POA: Diagnosis not present

## 2016-06-30 DIAGNOSIS — F411 Generalized anxiety disorder: Secondary | ICD-10-CM | POA: Diagnosis not present

## 2016-06-30 DIAGNOSIS — I10 Essential (primary) hypertension: Secondary | ICD-10-CM | POA: Diagnosis not present

## 2016-06-30 DIAGNOSIS — G2581 Restless legs syndrome: Secondary | ICD-10-CM | POA: Diagnosis not present

## 2016-06-30 DIAGNOSIS — G479 Sleep disorder, unspecified: Secondary | ICD-10-CM | POA: Diagnosis not present

## 2016-06-30 DIAGNOSIS — E7800 Pure hypercholesterolemia, unspecified: Secondary | ICD-10-CM | POA: Diagnosis not present

## 2016-06-30 DIAGNOSIS — J309 Allergic rhinitis, unspecified: Secondary | ICD-10-CM | POA: Diagnosis not present

## 2016-06-30 DIAGNOSIS — M1990 Unspecified osteoarthritis, unspecified site: Secondary | ICD-10-CM | POA: Diagnosis not present

## 2016-06-30 DIAGNOSIS — K219 Gastro-esophageal reflux disease without esophagitis: Secondary | ICD-10-CM | POA: Diagnosis not present

## 2016-06-30 DIAGNOSIS — M899 Disorder of bone, unspecified: Secondary | ICD-10-CM | POA: Diagnosis not present

## 2016-06-30 DIAGNOSIS — Z23 Encounter for immunization: Secondary | ICD-10-CM | POA: Diagnosis not present

## 2016-07-17 ENCOUNTER — Ambulatory Visit: Payer: Medicare Other | Admitting: Neurology

## 2016-07-21 ENCOUNTER — Ambulatory Visit (INDEPENDENT_AMBULATORY_CARE_PROVIDER_SITE_OTHER): Payer: Medicare Other | Admitting: Neurology

## 2016-07-21 ENCOUNTER — Encounter: Payer: Self-pay | Admitting: Neurology

## 2016-07-21 VITALS — BP 160/74 | HR 71 | Ht 62.0 in | Wt 151.0 lb

## 2016-07-21 DIAGNOSIS — G2581 Restless legs syndrome: Secondary | ICD-10-CM | POA: Diagnosis not present

## 2016-07-21 MED ORDER — ROPINIROLE HCL 1 MG PO TABS: 1.0000 mg | ORAL_TABLET | Freq: Three times a day (TID) | ORAL | 3 refills | Status: DC

## 2016-07-21 NOTE — Patient Instructions (Addendum)
Increase ropinirole 1mg  at 7am, 1pm, and 9pm.  You can try half-tablet in the afternoon first to see if a lower dose helps.  Return to clinic in 4 months

## 2016-07-21 NOTE — Progress Notes (Signed)
Follow-up Visit   Date: 07/21/16    Alicia [name] MRN: [id number] DOB: [birth year]   Interim History: Alicia [name] is a 75 y.o. right-handed Caucasian female with hypertension, GERD, hyperlipidemia, RLS, and insomia returning to the clinic for follow-up of RLS.  The patient was accompanied to the clinic by self.  History of present illness: She was diagnosed with RLS around early 2000s.  She has dull and deep ache involving the lower legs which is worse at rest such as driving or riding on plane for a long time and at nighttime.  The discomfort is improved with activity such as walking for 15-20 minutes.  She was started on ropinorole about 10 years ago and increased the dose about 4 years ago because symptoms started occuring sooner in the evening. Over the past year, she started having worsening discomfort and recently she began taking 1mg  TID without any added benefit, and in fact, feels that the discomfort is worsening. No numbness/tingling, weakness, or imbalance.   UPDATE 07/21/2016:  At her last visit, I attempted to reduce her dopamine agonist because of augmentation and transitioned her to gabapentin; however, she did not see any benefit with gabapentin, so discontinued this and restarted ropinorole.   She takes ropinorole 1mg  twice daily which seems to help her.  She has breakthrough pain in the afternoon around 1-2pm.  Her morning and nighttime symptoms are well-controlled.  No new complaints.   Medications:  Current Outpatient Prescriptions on File Prior to Visit  Medication Sig Dispense Refill  . amLODipine (NORVASC) 5 MG tablet Take 2.5 mg by mouth daily.    . calcium-vitamin D (OSCAL WITH D) 500-200 MG-UNIT per tablet Take 1 tablet by mouth daily.    Marland Kitchen glucosamine-chondroitin 500-400 MG tablet Take 1 tablet by mouth 3 (three) times daily.    Marland Kitchen LORazepam (ATIVAN) 0.5 MG tablet     . meloxicam (MOBIC) 15 MG tablet Take 15 mg by mouth daily.    . multivitamin-lutein  (OCUVITE-LUTEIN) CAPS Take 1 capsule by mouth daily.    . OMEGA 3 1000 MG CAPS Take 1 capsule by mouth daily.    Marland Kitchen omeprazole (PRILOSEC) 40 MG capsule     . simvastatin (ZOCOR) 20 MG tablet     . doxepin (SINEQUAN) 10 MG capsule Take 20 mg by mouth at bedtime.    . gabapentin (NEURONTIN) 300 MG capsule Take 1 tablet at bedtime x 3 days, then increase to 1 tablet twice daily. (Patient not taking: Reported on 07/21/2016) 60 capsule 5  . losartan (COZAAR) 25 MG tablet Take 25 mg by mouth daily.    . metoprolol tartrate (LOPRESSOR) 25 MG tablet Take 12.5 mg by mouth 2 (two) times daily.     No current facility-administered medications on file prior to visit.     Allergies:  Allergies  Allergen Reactions  . Ibuprofen     GI UPSET  . Lexapro [Escitalopram Oxalate]     GERD  . Lisinopril Cough  . Mobic [Meloxicam]     LEG PAIN  . Nabumetone     GI UPSET  . Prilosec Otc [Omeprazole Magnesium]     ARM SPASM  . Zoloft [Sertraline Hcl]     INSOMNIA  . Sulfa Antibiotics Rash    Review of Systems:  CONSTITUTIONAL: No fevers, chills, night sweats, or weight loss.  EYES: No visual changes or eye pain ENT: No hearing changes.  No history of nose bleeds.   RESPIRATORY: No cough, wheezing  and shortness of breath.   CARDIOVASCULAR: Negative for chest pain, and palpitations.   GI: Negative for abdominal discomfort, blood in stools or black stools.  No recent change in bowel habits.   GU:  No history of incontinence.   MUSCLOSKELETAL: No history of joint pain or swelling.  No myalgias.   SKIN: Negative for lesions, rash, and itching.   ENDOCRINE: Negative for cold or heat intolerance, polydipsia or goiter.   PSYCH:  No depression or anxiety symptoms.   NEURO: As Above.   Vital Signs:  BP (!) 160/74   Pulse 71   Ht 5\' 2"  (1.575 m)   Wt 151 lb (68.5 kg)   SpO2 97%   BMI 27.62 kg/m   Neurological Exam: MENTAL STATUS including orientation to time, place, person, recent and remote  memory, attention span and concentration, language, and fund of knowledge is normal.  Speech is not dysarthric.  CRANIAL NERVES:  Pupils equal round and reactive to light.  Normal conjugate, extra-ocular eye movements in all directions of gaze. Normal facial sensation.  Face is symmetric. Palate elevates symmetrically.  Tongue is midline.  MOTOR:  Motor strength is 5/5 in all extremities. Tone is normal.    COORDINATION/GAIT:   Gait narrow based and stable.   Data: Lab Results  Component Value Date   FERRITIN 83.7 03/28/2016    IMPRESSION/PLAN: Restless leg syndrome managed with ropinorole for at least > 10 years.  Initially, I suspected that she may have worsening discomfort due to augmentation and offered a trial of gabapentin and tapering ropinorole, but this did not provide any benefit.  With increasing her ropinirole to 1mg  BID, she has noticed improved symptoms, but continues to have breakthrough pain in the afternoon.  PLAN/RECOMMENDATIONS:  1.  Increase ropinorole 1mg  TID (7am, 1pm, and 9pm) 2.  Trial of Horizant was declined  Return to clinic in 4 months  The duration of this appointment visit was 20 minutes of face-to-face time with the patient.  Greater than 50% of this time was spent in counseling, explanation of diagnosis, planning of further management, and coordination of care.   Thank you for allowing me to participate in patient's care.  If I can answer any additional questions, I would be pleased to do so.    Sincerely,    Donika K. Posey Pronto, DO

## 2016-08-11 DIAGNOSIS — Z1231 Encounter for screening mammogram for malignant neoplasm of breast: Secondary | ICD-10-CM | POA: Diagnosis not present

## 2016-09-11 DIAGNOSIS — H02001 Unspecified entropion of right upper eyelid: Secondary | ICD-10-CM | POA: Diagnosis not present

## 2016-09-11 DIAGNOSIS — H16149 Punctate keratitis, unspecified eye: Secondary | ICD-10-CM | POA: Diagnosis not present

## 2016-09-11 DIAGNOSIS — H02051 Trichiasis without entropian right upper eyelid: Secondary | ICD-10-CM | POA: Diagnosis not present

## 2016-09-11 DIAGNOSIS — S0500XA Injury of conjunctiva and corneal abrasion without foreign body, unspecified eye, initial encounter: Secondary | ICD-10-CM | POA: Diagnosis not present

## 2016-09-11 DIAGNOSIS — S0501XA Injury of conjunctiva and corneal abrasion without foreign body, right eye, initial encounter: Secondary | ICD-10-CM | POA: Diagnosis not present

## 2016-09-16 DIAGNOSIS — M79601 Pain in right arm: Secondary | ICD-10-CM | POA: Diagnosis not present

## 2016-09-19 DIAGNOSIS — M67911 Unspecified disorder of synovium and tendon, right shoulder: Secondary | ICD-10-CM | POA: Diagnosis not present

## 2016-09-19 DIAGNOSIS — H02051 Trichiasis without entropian right upper eyelid: Secondary | ICD-10-CM | POA: Diagnosis not present

## 2016-09-19 DIAGNOSIS — H02001 Unspecified entropion of right upper eyelid: Secondary | ICD-10-CM | POA: Diagnosis not present

## 2016-09-30 DIAGNOSIS — M25511 Pain in right shoulder: Secondary | ICD-10-CM | POA: Diagnosis not present

## 2016-10-03 DIAGNOSIS — M75121 Complete rotator cuff tear or rupture of right shoulder, not specified as traumatic: Secondary | ICD-10-CM | POA: Diagnosis not present

## 2016-10-27 DIAGNOSIS — H0230 Blepharochalasis unspecified eye, unspecified eyelid: Secondary | ICD-10-CM | POA: Diagnosis not present

## 2016-10-27 DIAGNOSIS — L908 Other atrophic disorders of skin: Secondary | ICD-10-CM | POA: Diagnosis not present

## 2016-11-28 ENCOUNTER — Ambulatory Visit: Payer: Medicare Other | Admitting: Neurology

## 2016-12-30 DIAGNOSIS — M899 Disorder of bone, unspecified: Secondary | ICD-10-CM | POA: Diagnosis not present

## 2016-12-30 DIAGNOSIS — J309 Allergic rhinitis, unspecified: Secondary | ICD-10-CM | POA: Diagnosis not present

## 2016-12-30 DIAGNOSIS — E7800 Pure hypercholesterolemia, unspecified: Secondary | ICD-10-CM | POA: Diagnosis not present

## 2016-12-30 DIAGNOSIS — I10 Essential (primary) hypertension: Secondary | ICD-10-CM | POA: Diagnosis not present

## 2016-12-30 DIAGNOSIS — F411 Generalized anxiety disorder: Secondary | ICD-10-CM | POA: Diagnosis not present

## 2016-12-30 DIAGNOSIS — K219 Gastro-esophageal reflux disease without esophagitis: Secondary | ICD-10-CM | POA: Diagnosis not present

## 2016-12-30 DIAGNOSIS — G2581 Restless legs syndrome: Secondary | ICD-10-CM | POA: Diagnosis not present

## 2016-12-30 DIAGNOSIS — M1990 Unspecified osteoarthritis, unspecified site: Secondary | ICD-10-CM | POA: Diagnosis not present

## 2016-12-30 DIAGNOSIS — G479 Sleep disorder, unspecified: Secondary | ICD-10-CM | POA: Diagnosis not present

## 2016-12-31 ENCOUNTER — Encounter: Payer: Self-pay | Admitting: Neurology

## 2016-12-31 ENCOUNTER — Ambulatory Visit (INDEPENDENT_AMBULATORY_CARE_PROVIDER_SITE_OTHER): Payer: Medicare Other | Admitting: Neurology

## 2016-12-31 VITALS — BP 110/64 | HR 72 | Ht 62.0 in | Wt 146.1 lb

## 2016-12-31 DIAGNOSIS — G2581 Restless legs syndrome: Secondary | ICD-10-CM | POA: Diagnosis not present

## 2016-12-31 NOTE — Progress Notes (Signed)
Follow-up Visit   Date: 12/31/16    Alicia [name] MRN: [id number] DOB: 10/31/1940   Interim History: Alicia [name] is a 76 y.o. right-handed Caucasian female with hypertension, GERD, hyperlipidemia, RLS, and insomia returning to the clinic for follow-up of RLS.  The patient was accompanied to the clinic by self.  History of present illness: She was diagnosed with RLS around early 2000s.  She has dull and deep ache involving the lower legs which is worse at rest such as driving or riding on plane for a long time and at nighttime.  The discomfort is improved with activity such as walking for 15-20 minutes.  She was started on ropinorole about 10 years ago and increased the dose about 4 years ago because symptoms started occuring sooner in the evening. Over the past year, she started having worsening discomfort and recently she began taking 1mg  TID without any added benefit, and in fact, feels that the discomfort is worsening. No numbness/tingling, weakness, or imbalance.   UPDATE 07/21/2016:  At her last visit, I attempted to reduce her dopamine agonist because of augmentation and transitioned her to gabapentin; however, she did not see any benefit with gabapentin, so discontinued this and restarted ropinorole.   She takes ropinorole 1mg  twice daily which seems to help her.  She has breakthrough pain in the afternoon around 1-2pm.  Her morning and nighttime symptoms are well-controlled.  No new complaints.   UPDATE 12/31/2016:  She has noticed 90% relief with increasing her requip to 1mg  TID.  She no longer had daytime discomfort.  No new complaints. She had not had any interval hospitalizations or falls.   Medications:  Current Outpatient Prescriptions on File Prior to Visit  Medication Sig Dispense Refill  . amLODipine (NORVASC) 5 MG tablet Take 2.5 mg by mouth daily.    . calcium-vitamin D (OSCAL WITH D) 500-200 MG-UNIT per tablet Take 1 tablet by mouth daily.    Marland Kitchen doxepin (SINEQUAN) 10  MG capsule Take 20 mg by mouth at bedtime.    Marland Kitchen glucosamine-chondroitin 500-400 MG tablet Take 1 tablet by mouth 3 (three) times daily.    Marland Kitchen LORazepam (ATIVAN) 0.5 MG tablet     . losartan (COZAAR) 25 MG tablet Take 25 mg by mouth daily.    . meloxicam (MOBIC) 15 MG tablet Take 15 mg by mouth daily.    . metoprolol (LOPRESSOR) 50 MG tablet     . metoprolol tartrate (LOPRESSOR) 25 MG tablet Take 12.5 mg by mouth 2 (two) times daily.    . multivitamin-lutein (OCUVITE-LUTEIN) CAPS Take 1 capsule by mouth daily.    . OMEGA 3 1000 MG CAPS Take 1 capsule by mouth daily.    Marland Kitchen omeprazole (PRILOSEC) 40 MG capsule     . rOPINIRole (REQUIP) 1 MG tablet Take 1 tablet (1 mg total) by mouth 3 (three) times daily. 270 tablet 3  . simvastatin (ZOCOR) 20 MG tablet     . zolpidem (AMBIEN) 5 MG tablet Take 5 mg by mouth at bedtime as needed for sleep.     No current facility-administered medications on file prior to visit.     Allergies:  Allergies  Allergen Reactions  . Ibuprofen     GI UPSET  . Lexapro [Escitalopram Oxalate]     GERD  . Lisinopril Cough  . Mobic [Meloxicam]     LEG PAIN  . Nabumetone     GI UPSET  . Prilosec Otc [Omeprazole Magnesium]  ARM SPASM  . Zoloft [Sertraline Hcl]     INSOMNIA  . Sulfa Antibiotics Rash    Review of Systems:  CONSTITUTIONAL: No fevers, chills, night sweats, or weight loss.  EYES: No visual changes or eye pain ENT: No hearing changes.  No history of nose bleeds.   RESPIRATORY: No cough, wheezing and shortness of breath.   CARDIOVASCULAR: Negative for chest pain, and palpitations.   GI: Negative for abdominal discomfort, blood in stools or black stools.  No recent change in bowel habits.   GU:  No history of incontinence.   MUSCLOSKELETAL: No history of joint pain or swelling.  No myalgias.   SKIN: Negative for lesions, rash, and itching.   ENDOCRINE: Negative for cold or heat intolerance, polydipsia or goiter.   PSYCH:  No depression or anxiety  symptoms.   NEURO: As Above.   Vital Signs:  BP 110/64   Pulse 72   Ht 5\' 2"  (1.575 m)   Wt 146 lb 1 oz (66.3 kg)   SpO2 98%   BMI 26.72 kg/m   Neurological Exam: MENTAL STATUS including orientation to time, place, person, recent and remote memory, attention span and concentration, language, and fund of knowledge is normal.  Speech is not dysarthric.  CRANIAL NERVES:  Pupils equal round and reactive to light.  Normal conjugate, extra-ocular eye movements in all directions of gaze. Normal facial sensation.  Face is symmetric. Palate elevates symmetrically.  Tongue is midline.  MOTOR:  Motor strength is 5/5 in all extremities. Tone is normal.    COORDINATION/GAIT:   Gait narrow based and stable.   Data: Lab Results  Component Value Date   FERRITIN 83.7 03/28/2016    IMPRESSION/PLAN: Restless leg syndrome managed with ropinorole for at least > 10 years.  Initially, I suspected that she may have worsening discomfort due to augmentation and offered a trial of gabapentin and tapering ropinorole, but this did not provide any benefit.  With increasing her ropinirole to 1mg  TID she has noticed improved symptoms which provides 90% relief, including afternoon symtpoms.  She will be continued on ropinorole 1mg  TID (7am, 1pm, and 9pm)  Return to clinic in 4 months  The duration of this appointment visit was 20 minutes of face-to-face time with the patient.  Greater than 50% of this time was spent in counseling, explanation of diagnosis, planning of further management, and coordination of care.   Thank you for allowing me to participate in patient's care.  If I can answer any additional questions, I would be pleased to do so.    Sincerely,    Merlean Pizzini K. Posey Pronto, DO

## 2016-12-31 NOTE — Patient Instructions (Signed)
You look great!  Continue your medications as you are taking them  Return to clinic in 6 months  [facility]:  [phone] Tel [place]:   [phone] [email or web address]   Resources for common questions found under "Pathways & Protocols " [email or web address]_[email or web address]

## 2017-02-17 DIAGNOSIS — M75121 Complete rotator cuff tear or rupture of right shoulder, not specified as traumatic: Secondary | ICD-10-CM | POA: Diagnosis not present

## 2017-02-17 DIAGNOSIS — M7541 Impingement syndrome of right shoulder: Secondary | ICD-10-CM | POA: Diagnosis not present

## 2017-02-17 DIAGNOSIS — G8918 Other acute postprocedural pain: Secondary | ICD-10-CM | POA: Diagnosis not present

## 2017-02-25 DIAGNOSIS — M67911 Unspecified disorder of synovium and tendon, right shoulder: Secondary | ICD-10-CM | POA: Diagnosis not present

## 2017-02-27 ENCOUNTER — Ambulatory Visit (HOSPITAL_COMMUNITY): Payer: Medicare Other

## 2017-03-04 ENCOUNTER — Ambulatory Visit (HOSPITAL_COMMUNITY): Payer: Medicare Other | Attending: Orthopedic Surgery | Admitting: Occupational Therapy

## 2017-03-04 ENCOUNTER — Encounter (HOSPITAL_COMMUNITY): Payer: Self-pay | Admitting: Occupational Therapy

## 2017-03-04 DIAGNOSIS — M25511 Pain in right shoulder: Secondary | ICD-10-CM | POA: Diagnosis not present

## 2017-03-04 DIAGNOSIS — R29898 Other symptoms and signs involving the musculoskeletal system: Secondary | ICD-10-CM

## 2017-03-04 DIAGNOSIS — M25611 Stiffness of right shoulder, not elsewhere classified: Secondary | ICD-10-CM | POA: Diagnosis not present

## 2017-03-04 NOTE — Therapy (Signed)
[place], [place], [address] Phone: [phone]   Fax:  [phone]  Occupational Therapy Evaluation  Patient Details  Name: Alicia [name] MRN: [id number] Date of Birth: Oct 07, 1940 Referring Provider: Dr. Tania Ade  Encounter Date: 03/04/2017      OT End of Session - 03/04/17 1611    Visit Number 1   Number of Visits 16   Date for OT Re-Evaluation 05/03/17  mini-reassessment 04/02/17   Authorization Type Medicare A & B   Authorization Time Period Before 10th visit   Authorization - Visit Number 1   Authorization - Number of Visits 10   OT Start Time 7124   OT Stop Time 1503   OT Time Calculation (min) 29 min   Activity Tolerance Patient tolerated treatment well   Behavior During Therapy [facility] for tasks assessed/performed      Past Medical History:  Diagnosis Date  . Anxiety   . GERD (gastroesophageal reflux disease)   . Hypercholesteremia   . Hypertension   . Insomnia   . Kidney stone   . Osteoarthritis   . Osteopenia   . Restless leg syndrome   . RLS (restless legs syndrome) 03/28/2016    Past Surgical History:  Procedure Laterality Date  . ABDOMINAL HYSTERECTOMY    . APPENDECTOMY    . CATARACT EXTRACTION Right 02/2010  . CHOLECYSTECTOMY      There were no vitals filed for this visit.      Subjective Assessment - 03/04/17 1612    Subjective  S: I've been moving my elbow and hand.    Pertinent History Pt is a 76 y/o female s/p right rotator cuff repair on 02/17/17. Pt is wearing sling at all times, is taking pain medication 1x/day if needed. Pt was referred to occupational therapy for evaluation and treatment by Dr. Tania Ade   Special Tests FOTO Score: 4/100 (96% impairment)   Patient Stated Goals To be able to bowl again.    Currently in Pain? Yes   Pain Score 4    Pain Location Shoulder   Pain Orientation Right   Pain Descriptors / Indicators Aching;Sore   Pain Type Acute  pain   Pain Radiating Towards none   Pain Onset 1 to 4 weeks ago   Pain Frequency Constant   Aggravating Factors  movement, sleeping in wrong position   Pain Relieving Factors rest, ice, pain medications   Effect of Pain on Daily Activities Unable to use RUE for daily tasks   Multiple Pain Sites No           [facility] OT Assessment - 03/04/17 1432      Assessment   Diagnosis right rotator cuff repair   Referring Provider Dr. Tania Ade   Onset Date 02/17/17   Prior Therapy None     Precautions   Precautions Shoulder   Type of Shoulder Precautions Weeks 0-4 (5/15-6/12): P/ROM, pendulums, isometrics; Weeks 5-6 (6/14-6/28): Begin AA/ROM in supine, full P/ROM. Weeks 6-8 (6/28-7/12): Continue AA/ROM and begin A/ROM as tolerated. Weeks 10-16: begin strengthening as tolerated   Shoulder Interventions Shoulder sling/immobilizer;At all times     Balance Screen   Has the patient fallen in the past 6 months No   Has the patient had a decrease in activity level because of a fear of falling?  No   Is the patient reluctant to leave their home because of a fear of falling?  No     Prior Function  Level of Independence Independent   Vocation Retired   Naval architect, traveling, working in [employer], playing cards     ADL   ADL comments Pt is unable to use RUE for any ADL tasks-dressing, grooming, bathing, cooking, sleeping.      Written Expression   Dominant Hand Right     Cognition   Overall Cognitive Status Within Functional Limits for tasks assessed     ROM / Strength   AROM / PROM / Strength AROM;PROM;Strength     Palpation   Palpation comment Moderate fascial restrictions in right upper arm, trapezius, and scapularis regions     AROM   Overall AROM  Unable to assess;Due to precautions     PROM   Overall PROM Comments Assessed supine, IR/er adducted   PROM Assessment Site Shoulder   Right/Left Shoulder Right   Right Shoulder Flexion 115 Degrees   Right Shoulder  ABduction 80 Degrees   Right Shoulder Internal Rotation 90 Degrees   Right Shoulder External Rotation 35 Degrees                         OT Education - 03/04/17 1452    Education provided Yes   Education Details table slides   Person(s) Educated Patient   Methods Explanation;Demonstration;Handout   Comprehension Verbalized understanding;Returned demonstration          OT Short Term Goals - 03/04/17 1620      OT SHORT TERM GOAL #1   Title Pt will be educated on HEP to improve RUE use as dominant during ADL and leisure tasks.    Time 4   Period Weeks   Status New     OT SHORT TERM GOAL #2   Title Pt will decrease pain to 3/10 in RUE to improve ability to sleep at night.    Time 4   Period Weeks   Status New     OT SHORT TERM GOAL #3   Title Pt will decrease RUE fascial restrictions from moderate to minimal amounts to improve mobility required for functional reaching tasks.    Time 4   Period Weeks   Status New     OT SHORT TERM GOAL #4   Title Pt will increase RUE P/ROM to [facility] to improve ability to perform dressing tasks.    Time 4   Period Weeks   Status New     OT SHORT TERM GOAL #5   Title Pt will improve RUE strength to 3/5 to increase ability to reach for items on tabletop.    Time 4   Period Weeks   Status New           OT Long Term Goals - 03/04/17 1623      OT LONG TERM GOAL #1   Title Pt will return to highest level of independence and functioning using RUE as dominant during functional task completion.    Time 8   Period Weeks   Status New     OT LONG TERM GOAL #2   Title Pt will decrease pain in RUE to 1/10 or less to improve ability to perform daily tasks using RUE as dominant.    Time 8   Period Weeks   Status New     OT LONG TERM GOAL #3   Title Pt will decrease fascial restrictions in RUE to trace amounts to improve mobility required for fixing hair.    Time 8   Period Weeks  Status New     OT LONG TERM GOAL #4    Title Pt will improve RUE A/ROM to WNL to increase ability to reach into overhead cabinets.    Time 8   Period Weeks   Status New     OT LONG TERM GOAL #5   Title Pt will improve RUE strength to 4+/5 to increase ability to bowl using RUE as dominant.    Time 8   Period Weeks   Status New               Plan - Mar 31, 2017 1614    Clinical Impression Statement A: Pt is a 76 y/o female s/p right rotator cuff repair on 02/17/17 presenting with limitations in ROM, strength, increased pain and fascial restrictions limiting use of RUE during daily tasks. Pt provided with table slide HEP this session.    Occupational Profile and client history currently impacting functional performance Pt is motivated to return to highest level of indepence in B/IADL completion   Occupational performance deficits (Please refer to evaluation for details): ADL's;IADL's;Rest and Sleep;Leisure   Rehab Potential Good   OT Frequency 2x / week   OT Duration 8 weeks   OT Treatment/Interventions Self-care/ADL training;Therapeutic exercise;Patient/family education;Ultrasound;Manual Therapy;Cryotherapy;Therapeutic activities;Electrical Stimulation;Passive range of motion   Plan P: Pt will benefit from skilled OT services to decrease pain and fascial restrictions, increase RUE ROM and strength, and improve RUE use as dominant during functional task completion. Treatment plan: myofascial release, manual therapy, P/ROM, AA/ROM, A/ROM, scapular stability and strengthening, general RUE strengthening, modalities as needed   Clinical Decision Making Limited treatment options, no task modification necessary   OT Home Exercise Plan 2023-04-01: Table slides   Consulted and Agree with Plan of Care Patient      Patient will benefit from skilled therapeutic intervention in order to improve the following deficits and impairments:  Decreased strength, Decreased activity tolerance, Decreased range of motion, Pain, Increased fascial  restricitons, Impaired UE functional use  Visit Diagnosis: Acute pain of right shoulder  Stiffness of right shoulder, not elsewhere classified  Other symptoms and signs involving the musculoskeletal system      G-Codes - 2017-03-31 1626    Functional Assessment Tool Used (Outpatient only) FOTO Score: 4/100 (96% impairment)   Functional Limitation Carrying, moving and handling objects   Carrying, Moving and Handling Objects Current Status (O7096) At least 80 percent but less than 100 percent impaired, limited or restricted   Carrying, Moving and Handling Objects Goal Status (G8366) At least 20 percent but less than 40 percent impaired, limited or restricted      Problem List Patient Active Problem List   Diagnosis Date Noted  . RLS (restless legs syndrome) 03/28/2016   Guadelupe Sabin, OTR/L  [phone] 31-Mar-2017, 4:26 PM  [facility] [address] [place], [place], [address] Phone: [phone]   Fax:  [phone]  Name: YANISSA MICHALSKY MRN: [id number] Date of Birth: 1940-11-07

## 2017-03-04 NOTE — Patient Instructions (Signed)
SHOULDER: Flexion On Table   Place hands on table, elbows straight. Move hips away from body. Press hands down into table.  _10-15__ reps per set, _2-3__ sets per day  Abduction (Passive)   With arm out to side, resting on table, lower head toward arm, keeping trunk away from table.  Repeat __10-15__ times. Do _2-3___ sessions per day.  Copyright  VHI. All rights reserved.     Internal Rotation (Assistive)   Seated with elbow bent at right angle and held against side, slide arm on table surface in an inward arc. Repeat _10-15___ times. Do _2-3___ sessions per day. Activity: Use this motion to brush crumbs off the table.  Copyright  VHI. All rights reserved.    

## 2017-03-11 ENCOUNTER — Ambulatory Visit (HOSPITAL_COMMUNITY): Payer: Medicare Other | Attending: Orthopedic Surgery

## 2017-03-11 ENCOUNTER — Encounter (HOSPITAL_COMMUNITY): Payer: Self-pay

## 2017-03-11 DIAGNOSIS — R29898 Other symptoms and signs involving the musculoskeletal system: Secondary | ICD-10-CM

## 2017-03-11 DIAGNOSIS — M25611 Stiffness of right shoulder, not elsewhere classified: Secondary | ICD-10-CM

## 2017-03-11 DIAGNOSIS — M25511 Pain in right shoulder: Secondary | ICD-10-CM | POA: Insufficient documentation

## 2017-03-11 NOTE — Therapy (Signed)
[place], [place], [address] Phone: [phone]   Fax:  [phone]  Occupational Therapy Treatment  Patient Details  Name: Alicia [name] MRN: [id number] Date of Birth: 05-17-1941 Referring Provider: Dr. Tania Ade  Encounter Date: 03/11/2017      OT End of Session - 03/11/17 1429    Visit Number 2   Number of Visits 16   Date for OT Re-Evaluation 05/03/17  mini-reassessment: 04/02/17   Authorization Type Medicare A & B   Authorization Time Period Before 10th visit   Authorization - Visit Number 2   Authorization - Number of Visits 10   OT Start Time 9381   OT Stop Time 1429   OT Time Calculation (min) 40 min   Activity Tolerance Patient tolerated treatment well   Behavior During Therapy [facility] for tasks assessed/performed      Past Medical History:  Diagnosis Date  . Anxiety   . GERD (gastroesophageal reflux disease)   . Hypercholesteremia   . Hypertension   . Insomnia   . Kidney stone   . Osteoarthritis   . Osteopenia   . Restless leg syndrome   . RLS (restless legs syndrome) 03/28/2016    Past Surgical History:  Procedure Laterality Date  . ABDOMINAL HYSTERECTOMY    . APPENDECTOMY    . CATARACT EXTRACTION Right 02/2010  . CHOLECYSTECTOMY      There were no vitals filed for this visit.      Subjective Assessment - 03/11/17 1400    Subjective  S: I've had to take a break from bowling to give my arm a rest.   Currently in Pain? Yes   Pain Score 4    Pain Location Shoulder   Pain Orientation Right   Pain Descriptors / Indicators Aching   Pain Type Acute pain   Pain Radiating Towards N/A   Pain Onset 1 to 4 weeks ago   Pain Frequency Constant   Aggravating Factors  movement, sleeping in the wrong position   Pain Relieving Factors res, heat, pain medications   Effect of Pain on Daily Activities severe   Multiple Pain Sites No            [facility] OT Assessment - 03/11/17 1402       Assessment   Diagnosis right rotator cuff repair   Referring Provider --     Precautions   Precautions Shoulder   Type of Shoulder Precautions Weeks 0-4 (5/15-6/12): P/ROM, pendulums, isometrics; Weeks 5-6 (6/14-6/28): Begin AA/ROM in supine, full P/ROM. Weeks 6-8 (6/28-7/12): Continue AA/ROM and begin A/ROM as tolerated. Weeks 10-16: begin strengthening as tolerated   Shoulder Interventions Shoulder sling/immobilizer;At all times                  OT Treatments/Exercises (OP) - 03/11/17 1403      Exercises   Exercises Shoulder     Shoulder Exercises: Supine   Protraction PROM;5 reps   Horizontal ABduction PROM;5 reps   External Rotation PROM;5 reps   Internal Rotation PROM;5 reps   Flexion PROM;5 reps   ABduction PROM;5 reps     Shoulder Exercises: Seated   Elevation AROM;10 reps   Extension AROM;10 reps   Row AROM;10 reps     Shoulder Exercises: Therapy Ball   Flexion 10 reps   ABduction 10 reps     Shoulder Exercises: ROM/Strengthening   Thumb Tacks 1' low level     Shoulder Exercises: Isometric Strengthening  Flexion 3X5"  seated   Extension 3X5"  seated   External Rotation 3X5"  seated   Internal Rotation 3X5"  seated   ABduction 3X5"  seated   ADduction 3X5"  seated     Manual Therapy   Manual Therapy Myofascial release   Manual therapy comments manual therapy completed seperately from all other interventions this date of service   Myofascial Release myofascial release and manual stretching  to right upper arm, scapular, trapezius and shoulder and right side cervical region to decrease pain and improve pain freem mobility in right shoulder region                 OT Education - 03/11/17 1429    Education provided Yes   Education Details Gave her the evaluation and went over the goals   Person(s) Educated Patient   Methods Explanation;Handout   Comprehension Verbalized understanding          OT Short Term Goals - 03/11/17  1557      OT SHORT TERM GOAL #1   Title Pt will be educated on HEP to improve RUE use as dominant during ADL and leisure tasks.    Time 4   Period Weeks   Status On-going     OT SHORT TERM GOAL #2   Title Pt will decrease pain to 3/10 in RUE to improve ability to sleep at night.    Time 4   Period Weeks   Status On-going     OT SHORT TERM GOAL #3   Title Pt will decrease RUE fascial restrictions from moderate to minimal amounts to improve mobility required for functional reaching tasks.    Time 4   Period Weeks   Status On-going     OT SHORT TERM GOAL #4   Title Pt will increase RUE P/ROM to [facility] to improve ability to perform dressing tasks.    Time 4   Period Weeks   Status On-going     OT SHORT TERM GOAL #5   Title Pt will improve RUE strength to 3/5 to increase ability to reach for items on tabletop.    Time 4   Period Weeks   Status On-going           OT Long Term Goals - 03/11/17 1557      OT LONG TERM GOAL #1   Title Pt will return to highest level of independence and functioning using RUE as dominant during functional task completion.    Time 8   Period Weeks   Status On-going     OT LONG TERM GOAL #2   Title Pt will decrease pain in RUE to 1/10 or less to improve ability to perform daily tasks using RUE as dominant.    Time 8   Period Weeks   Status On-going     OT LONG TERM GOAL #3   Title Pt will decrease fascial restrictions in RUE to trace amounts to improve mobility required for fixing hair.    Time 8   Period Weeks   Status On-going     OT LONG TERM GOAL #4   Title Pt will improve RUE A/ROM to WNL to increase ability to reach into overhead cabinets.    Time 8   Period Weeks   Status On-going     OT LONG TERM GOAL #5   Title Pt will improve RUE strength to 4+/5 to increase ability to bowl using RUE as dominant.    Time 8  Period Weeks   Status On-going               Plan - 03/11/17 1553    Clinical Impression Statement A:  Initiated myofascial release, P/ROM, therapy ball stretches, low level thumb tacks. Pt had some difficulty with thumb tacks and required min physical assist to unweight her right arm. VC needed for form and technique.   Plan P: Add pendulums to HEP. Add pro/ret/elev/dep.   Consulted and Agree with Plan of Care Patient      Patient will benefit from skilled therapeutic intervention in order to improve the following deficits and impairments:  Decreased strength, Decreased activity tolerance, Decreased range of motion, Pain, Increased fascial restricitons, Impaired UE functional use  Visit Diagnosis: Acute pain of right shoulder  Stiffness of right shoulder, not elsewhere classified  Other symptoms and signs involving the musculoskeletal system    Problem List Patient Active Problem List   Diagnosis Date Noted  . RLS (restless legs syndrome) 03/28/2016    Luther Hearing, OT Student [phone] 03/11/2017, 3:58 PM  [facility] [address] [place], [place], [address] Phone: [phone]   Fax:  [phone]  Name: Alicia [name] MRN: [id number] Date of Birth: 1940/11/01   This entire session was guided, instructed, and directly supervised by Ailene Ravel, OTR/L, CBIS.  Ailene Ravel, OTR/L,CBIS  [phone]

## 2017-03-13 ENCOUNTER — Encounter (HOSPITAL_COMMUNITY): Payer: Self-pay

## 2017-03-13 ENCOUNTER — Ambulatory Visit (HOSPITAL_COMMUNITY): Payer: Medicare Other

## 2017-03-13 DIAGNOSIS — R29898 Other symptoms and signs involving the musculoskeletal system: Secondary | ICD-10-CM

## 2017-03-13 DIAGNOSIS — M25511 Pain in right shoulder: Secondary | ICD-10-CM

## 2017-03-13 DIAGNOSIS — M25611 Stiffness of right shoulder, not elsewhere classified: Secondary | ICD-10-CM

## 2017-03-13 NOTE — Patient Instructions (Signed)
    COMPLETE PENDULUM EXERCISES FOR 30 SECONDS TO A MINUTE EACH, 3-5 TIMES PER DAY. ROM: Pendulum (Side-to-Side)    http://orth.exer.us/792   Copyright  VHI. All rights reserved.  Pendulum Forward/Back   Bend forward 90 at waist, using table for support. Rock body forward and back to swing arm. Repeat ____ times. Do ____ sessions per day.  Copyright  VHI. All rights reserved.  Pendulum Circular   Bend forward 90 at waist, leaning on table for support. Rock body in a circular pattern to move arm clockwise ____ times then counterclockwise ____ times. Do ____ sessions per day.

## 2017-03-13 NOTE — Therapy (Signed)
[place], [place], [address] Phone: [phone]   Fax:  [phone]  Occupational Therapy Treatment  Patient Details  Name: Alicia [name] MRN: [id number] Date of Birth: 14-Dec-1940 Referring Provider: Dr. Tania Ade  Encounter Date: 03/13/2017      OT End of Session - 03/13/17 1348    Visit Number 3   Number of Visits 16   Date for OT Re-Evaluation 05/03/17  mini-reassessment: 04/02/17   Authorization Type Medicare A & B   Authorization Time Period Before 10th visit   Authorization - Visit Number 3   Authorization - Number of Visits 10   OT Start Time 2025   OT Stop Time 1347   OT Time Calculation (min) 40 min   Activity Tolerance Patient tolerated treatment well   Behavior During Therapy [facility] for tasks assessed/performed      Past Medical History:  Diagnosis Date  . Anxiety   . GERD (gastroesophageal reflux disease)   . Hypercholesteremia   . Hypertension   . Insomnia   . Kidney stone   . Osteoarthritis   . Osteopenia   . Restless leg syndrome   . RLS (restless legs syndrome) 03/28/2016    Past Surgical History:  Procedure Laterality Date  . ABDOMINAL HYSTERECTOMY    . APPENDECTOMY    . CATARACT EXTRACTION Right 02/2010  . CHOLECYSTECTOMY      There were no vitals filed for this visit.      Subjective Assessment - 03/13/17 1310    Subjective  S: I tried to do my hair and now my arm is hurting.   Currently in Pain? Yes   Pain Score 3    Pain Location Shoulder   Pain Orientation Right   Pain Descriptors / Indicators Aching   Pain Type Acute pain   Pain Radiating Towards N/A   Pain Onset 1 to 4 weeks ago   Pain Frequency Intermittent   Aggravating Factors  movement   Pain Relieving Factors rest, heat, pain medications   Effect of Pain on Daily Activities severe   Multiple Pain Sites No            [facility] OT Assessment - 03/13/17 1312      Assessment   Diagnosis right rotator  cuff repair     Precautions   Precautions Shoulder   Type of Shoulder Precautions Weeks 0-4 (5/15-6/12): P/ROM, pendulums, isometrics; Weeks 5-6 (6/14-6/28): Begin AA/ROM in supine, full P/ROM. Weeks 6-8 (6/28-7/12): Continue AA/ROM and begin A/ROM as tolerated. Weeks 10-16: begin strengthening as tolerated   Shoulder Interventions Shoulder sling/immobilizer;At all times                  OT Treatments/Exercises (OP) - 03/13/17 1313      Exercises   Exercises Shoulder     Shoulder Exercises: Supine   Protraction PROM;5 reps   Horizontal ABduction PROM;5 reps   External Rotation PROM;5 reps   Internal Rotation PROM;5 reps   Flexion PROM;5 reps   ABduction PROM;5 reps     Shoulder Exercises: Seated   Elevation AROM;10 reps   Extension AROM;10 reps   Row AROM;10 reps     Shoulder Exercises: Therapy Ball   Flexion 10 reps   ABduction 10 reps     Shoulder Exercises: ROM/Strengthening   Thumb Tacks 1' low level   Pendulum 30 seconds, side to side, forwards, and circles   Prot/Ret//Elev/Dep 1'     Shoulder Exercises: Isometric  Strengthening   Flexion 5X5"   Extension 5X5"   External Rotation 5X5"   Internal Rotation 5X5"   ABduction 5X5"   ADduction 5X5"     Manual Therapy   Manual Therapy Myofascial release   Manual therapy comments manual therapy completed seperately from all other interventions this date of service   Myofascial Release myofascial release and manual stretching  to right upper arm, scapular, trapezius and shoulder and right side cervical region to decrease pain and improve pain freem mobility in right shoulder region                 OT Education - 03/13/17 1348    Education provided Yes   Education Details Provided HEP with pendulums   Person(s) Educated Patient   Methods Explanation;Demonstration;Verbal cues   Comprehension Verbalized understanding;Returned demonstration          OT Short Term Goals - 03/11/17 1557      OT  SHORT TERM GOAL #1   Title Pt will be educated on HEP to improve RUE use as dominant during ADL and leisure tasks.    Time 4   Period Weeks   Status On-going     OT SHORT TERM GOAL #2   Title Pt will decrease pain to 3/10 in RUE to improve ability to sleep at night.    Time 4   Period Weeks   Status On-going     OT SHORT TERM GOAL #3   Title Pt will decrease RUE fascial restrictions from moderate to minimal amounts to improve mobility required for functional reaching tasks.    Time 4   Period Weeks   Status On-going     OT SHORT TERM GOAL #4   Title Pt will increase RUE P/ROM to [facility] to improve ability to perform dressing tasks.    Time 4   Period Weeks   Status On-going     OT SHORT TERM GOAL #5   Title Pt will improve RUE strength to 3/5 to increase ability to reach for items on tabletop.    Time 4   Period Weeks   Status On-going           OT Long Term Goals - 03/11/17 1557      OT LONG TERM GOAL #1   Title Pt will return to highest level of independence and functioning using RUE as dominant during functional task completion.    Time 8   Period Weeks   Status On-going     OT LONG TERM GOAL #2   Title Pt will decrease pain in RUE to 1/10 or less to improve ability to perform daily tasks using RUE as dominant.    Time 8   Period Weeks   Status On-going     OT LONG TERM GOAL #3   Title Pt will decrease fascial restrictions in RUE to trace amounts to improve mobility required for fixing hair.    Time 8   Period Weeks   Status On-going     OT LONG TERM GOAL #4   Title Pt will improve RUE A/ROM to WNL to increase ability to reach into overhead cabinets.    Time 8   Period Weeks   Status On-going     OT LONG TERM GOAL #5   Title Pt will improve RUE strength to 4+/5 to increase ability to bowl using RUE as dominant.    Time 8   Period Weeks   Status On-going  Plan - 03/13/17 1349    Clinical Impression Statement A: Completed  myofascial realease, P/ROM and seated A/ROM. Pt presented with pain during low level thumb tacks and pro/ret/elev/dep. VC needed for form and technique. Pt stated that she had a session canceled next week and was working on rescheduling.   Plan P: Continue with low level thumb tacks and pro/ret/elev/dep. Add elbow/wrist A/ROM per protocol.   OT Home Exercise Plan 5/30: Table slides; 6/8: Pendulums      Patient will benefit from skilled therapeutic intervention in order to improve the following deficits and impairments:  Decreased strength, Decreased activity tolerance, Decreased range of motion, Pain, Increased fascial restricitons, Impaired UE functional use  Visit Diagnosis: Acute pain of right shoulder  Stiffness of right shoulder, not elsewhere classified  Other symptoms and signs involving the musculoskeletal system    Problem List Patient Active Problem List   Diagnosis Date Noted  . RLS (restless legs syndrome) 03/28/2016    Luther Hearing, OT Student  [phone] 03/13/2017, 2:02 PM  [place], [place], [address] Phone: [phone]   Fax:  [phone]  Name: MARGRETT KALB MRN: [id number] Date of Birth: 13-Jan-1941   .  This qualified practitioner was present in the room guiding the student in service delivery. Therapy student was participating in the provision of services, and the practitioner was not engaged in treating another patient or doing other tasks at the same time.  Ailene Ravel, OTR/L,CBIS  [phone]

## 2017-03-16 ENCOUNTER — Ambulatory Visit (HOSPITAL_COMMUNITY): Payer: Medicare Other | Admitting: Specialist

## 2017-03-16 ENCOUNTER — Encounter (HOSPITAL_COMMUNITY): Payer: Medicare Other

## 2017-03-17 ENCOUNTER — Ambulatory Visit (HOSPITAL_COMMUNITY): Payer: Medicare Other | Admitting: Specialist

## 2017-03-17 DIAGNOSIS — M25611 Stiffness of right shoulder, not elsewhere classified: Secondary | ICD-10-CM

## 2017-03-17 DIAGNOSIS — M25511 Pain in right shoulder: Secondary | ICD-10-CM | POA: Diagnosis not present

## 2017-03-17 DIAGNOSIS — R29898 Other symptoms and signs involving the musculoskeletal system: Secondary | ICD-10-CM

## 2017-03-17 NOTE — Therapy (Signed)
[place], [place], [address] Phone: [phone]   Fax:  [phone]  Occupational Therapy Treatment  Patient Details  Name: Alicia [name] MRN: [id number] Date of Birth: September 01, 1941 Referring Provider: Dr. Tania Ade  Encounter Date: 03/17/2017      OT End of Session - 03/17/17 1624    Visit Number 4   Number of Visits 16   Date for OT Re-Evaluation 05/03/17  mini reassess on 04/02/17   Authorization Type Medicare A & B   Authorization Time Period Before 10th visit   Authorization - Visit Number 4   Authorization - Number of Visits 10   OT Start Time 1520   OT Stop Time 1600   OT Time Calculation (min) 40 min   Activity Tolerance Patient tolerated treatment well   Behavior During Therapy [facility] for tasks assessed/performed      Past Medical History:  Diagnosis Date  . Anxiety   . GERD (gastroesophageal reflux disease)   . Hypercholesteremia   . Hypertension   . Insomnia   . Kidney stone   . Osteoarthritis   . Osteopenia   . Restless leg syndrome   . RLS (restless legs syndrome) 03/28/2016    Past Surgical History:  Procedure Laterality Date  . ABDOMINAL HYSTERECTOMY    . APPENDECTOMY    . CATARACT EXTRACTION Right 02/2010  . CHOLECYSTECTOMY      There were no vitals filed for this visit.      Subjective Assessment - 03/17/17 1623    Subjective  S:  Just how much should I do with my arM?  therapist educated patient on her protocol and precautions, educated patient that she should not move arm above waist height without physical assistance for an additional 2 weeks.    Currently in Pain? Yes   Pain Score 5    Pain Location Shoulder   Pain Orientation Right            [facility] OT Assessment - 03/17/17 0001      Assessment   Diagnosis right rotator cuff repair     Precautions   Precautions Shoulder   Type of Shoulder Precautions Weeks 0-4 (5/15-6/12): P/ROM, pendulums, isometrics;  Weeks 5-6 (6/14-6/28): Begin AA/ROM in supine, full P/ROM. Weeks 6-8 (6/28-7/12): Continue AA/ROM and begin A/ROM as tolerated. Weeks 10-16: begin strengthening as tolerated   Shoulder Interventions Shoulder sling/immobilizer;At all times                  OT Treatments/Exercises (OP) - 03/17/17 0001      Exercises   Exercises Shoulder     Shoulder Exercises: Supine   Protraction PROM;5 reps   Horizontal ABduction PROM;5 reps   External Rotation PROM;5 reps   Internal Rotation PROM;5 reps   Flexion PROM;5 reps   ABduction PROM;5 reps     Shoulder Exercises: Seated   Elevation AROM;15 reps   Extension AROM;15 reps   Row AROM;15 reps     Shoulder Exercises: Therapy Ball   Flexion 20 reps   ABduction 20 reps     Shoulder Exercises: ROM/Strengthening   Thumb Tacks 1' with arms at waist height   Caudal Glide 3 x 5"   Prot/Ret//Elev/Dep 1' with arms at waist height and max vg and tactile cues for technique      Shoulder Exercises: Isometric Strengthening   Flexion Supine  3 x 10"   Extension Supine  3 X 10 "  External Rotation Supine  3 X 10"   Internal Rotation Supine  3 X 10"   ABduction Supine  3 X 10"   ADduction Supine  3 X 10"     Manual Therapy   Manual Therapy Myofascial release   Manual therapy comments manual therapy completed seperately from all other interventions this date of service   Myofascial Release myofascial release and manual stretching  to right upper arm, scapular, trapezius and shoulder and right side cervical region to decrease pain and improve pain freem mobility in right shoulder region                 OT Education - 03/17/17 1624    Education provided Yes   Education Details reviewed protocol and precautions:   therapist educated patient on her protocol and precautions, educated patient that she should not move arm above waist height without physical assistance for an additional 2 weeks.    Person(s) Educated Patient    Methods Explanation;Verbal cues   Comprehension Verbalized understanding;Returned demonstration          OT Short Term Goals - 03/11/17 1557      OT SHORT TERM GOAL #1   Title Pt will be educated on HEP to improve RUE use as dominant during ADL and leisure tasks.    Time 4   Period Weeks   Status On-going     OT SHORT TERM GOAL #2   Title Pt will decrease pain to 3/10 in RUE to improve ability to sleep at night.    Time 4   Period Weeks   Status On-going     OT SHORT TERM GOAL #3   Title Pt will decrease RUE fascial restrictions from moderate to minimal amounts to improve mobility required for functional reaching tasks.    Time 4   Period Weeks   Status On-going     OT SHORT TERM GOAL #4   Title Pt will increase RUE P/ROM to [facility] to improve ability to perform dressing tasks.    Time 4   Period Weeks   Status On-going     OT SHORT TERM GOAL #5   Title Pt will improve RUE strength to 3/5 to increase ability to reach for items on tabletop.    Time 4   Period Weeks   Status On-going           OT Long Term Goals - 03/11/17 1557      OT LONG TERM GOAL #1   Title Pt will return to highest level of independence and functioning using RUE as dominant during functional task completion.    Time 8   Period Weeks   Status On-going     OT LONG TERM GOAL #2   Title Pt will decrease pain in RUE to 1/10 or less to improve ability to perform daily tasks using RUE as dominant.    Time 8   Period Weeks   Status On-going     OT LONG TERM GOAL #3   Title Pt will decrease fascial restrictions in RUE to trace amounts to improve mobility required for fixing hair.    Time 8   Period Weeks   Status On-going     OT LONG TERM GOAL #4   Title Pt will improve RUE A/ROM to WNL to increase ability to reach into overhead cabinets.    Time 8   Period Weeks   Status On-going     OT LONG TERM GOAL #5  Title Pt will improve RUE strength to 4+/5 to increase ability to bowl using RUE as  dominant.    Time 8   Period Weeks   Status On-going               Plan - 03/17/17 1625    Clinical Impression Statement A:  lowered thumb tacks to arms at waist height with increased comfort vs last session.  increased contraction time on isometrics to 10 seconds.    Plan P:  Add elbow, wrist A/ROM.  Increase isometric contraction time to 15 seconds.  Discuss compensatory techniques for curling or blowdrying hair with 1 arm.        Patient will benefit from skilled therapeutic intervention in order to improve the following deficits and impairments:  Decreased strength, Decreased activity tolerance, Decreased range of motion, Pain, Increased fascial restricitons, Impaired UE functional use  Visit Diagnosis: Acute pain of right shoulder  Stiffness of right shoulder, not elsewhere classified  Other symptoms and signs involving the musculoskeletal system    Problem List Patient Active Problem List   Diagnosis Date Noted  . RLS (restless legs syndrome) 03/28/2016    Vangie Bicker, [place], OTR/L [phone]  03/17/2017, 4:31 PM  [facility] [address] [place], [place], [address] Phone: [phone]   Fax:  [phone]  Name: Alicia [name] MRN: [id number] Date of Birth: 05/17/1941

## 2017-03-18 ENCOUNTER — Encounter (HOSPITAL_COMMUNITY): Payer: Medicare Other

## 2017-03-20 ENCOUNTER — Encounter (HOSPITAL_COMMUNITY): Payer: Self-pay | Admitting: Occupational Therapy

## 2017-03-20 ENCOUNTER — Ambulatory Visit (HOSPITAL_COMMUNITY): Payer: Medicare Other | Admitting: Occupational Therapy

## 2017-03-20 DIAGNOSIS — R29898 Other symptoms and signs involving the musculoskeletal system: Secondary | ICD-10-CM | POA: Diagnosis not present

## 2017-03-20 DIAGNOSIS — M25511 Pain in right shoulder: Secondary | ICD-10-CM | POA: Diagnosis not present

## 2017-03-20 DIAGNOSIS — M25611 Stiffness of right shoulder, not elsewhere classified: Secondary | ICD-10-CM

## 2017-03-20 NOTE — Therapy (Signed)
[place], [place], [address] Phone: [phone]   Fax:  [phone]  Occupational Therapy Treatment  Patient Details  Name: Alicia [name] MRN: [id number] Date of Birth: 25-Jan-1941 Referring Provider: Dr. Tania Ade  Encounter Date: 03/20/2017      OT End of Session - 03/20/17 1307    Visit Number 5   Number of Visits 16   Date for OT Re-Evaluation 05/03/17  mini reassess on 04/02/17   Authorization Type Medicare A & B   Authorization Time Period Before 10th visit   Authorization - Visit Number 5   Authorization - Number of Visits 10   OT Start Time 1213   OT Stop Time 1300   OT Time Calculation (min) 47 min   Activity Tolerance Patient tolerated treatment well   Behavior During Therapy [facility] for tasks assessed/performed      Past Medical History:  Diagnosis Date  . Anxiety   . GERD (gastroesophageal reflux disease)   . Hypercholesteremia   . Hypertension   . Insomnia   . Kidney stone   . Osteoarthritis   . Osteopenia   . Restless leg syndrome   . RLS (restless legs syndrome) 03/28/2016    Past Surgical History:  Procedure Laterality Date  . ABDOMINAL HYSTERECTOMY    . APPENDECTOMY    . CATARACT EXTRACTION Right 02/2010  . CHOLECYSTECTOMY      There were no vitals filed for this visit.      Subjective Assessment - 03/20/17 1215    Subjective  S: Some days it doesn't hurt at all and some days it hurts down to my hand.    Currently in Pain? Yes   Pain Score 2    Pain Location Shoulder   Pain Orientation Right   Pain Descriptors / Indicators Aching   Pain Type Acute pain   Pain Radiating Towards n/a   Pain Onset 1 to 4 weeks ago   Pain Frequency Intermittent   Aggravating Factors  movement   Pain Relieving Factors rest, heat, pain meds   Effect of Pain on Daily Activities unable to use RUE for daily tasks   Multiple Pain Sites No            [facility] OT Assessment - 03/20/17 1214       Assessment   Diagnosis right rotator cuff repair     Precautions   Precautions Shoulder   Type of Shoulder Precautions Weeks 0-4 (5/15-6/12): P/ROM, pendulums, isometrics; Weeks 5-6 (6/14-6/28): Begin AA/ROM in supine, full P/ROM. Weeks 6-8 (6/28-7/12): Continue AA/ROM and begin A/ROM as tolerated. Weeks 10-16: begin strengthening as tolerated   Shoulder Interventions Shoulder sling/immobilizer;At all times                  OT Treatments/Exercises (OP) - 03/20/17 1215      Exercises   Exercises Shoulder     Shoulder Exercises: Supine   Protraction PROM;5 reps   Horizontal ABduction PROM;5 reps   External Rotation PROM;5 reps   Internal Rotation PROM;5 reps   Flexion PROM;5 reps   ABduction PROM;5 reps     Shoulder Exercises: Seated   Elevation AROM;15 reps   Extension AROM;15 reps   Row AROM;15 reps     Shoulder Exercises: Therapy Ball   Flexion 20 reps   ABduction 20 reps     Shoulder Exercises: ROM/Strengthening   Thumb Tacks 1' with arms at waist height   Anterior Glide 3 X  5   Caudal Glide 3 x 5"   Prot/Ret//Elev/Dep 1' with arms at waist height and max vg and tactile cues for technique      Shoulder Exercises: Isometric Strengthening   Flexion Supine  3X15"   Extension Supine  3X15"   External Rotation Supine  3X15"   Internal Rotation Supine  3X15"   ABduction Supine  3X15"   ADduction Supine  3X15"     Manual Therapy   Manual Therapy Myofascial release   Manual therapy comments manual therapy completed seperately from all other interventions this date of service   Myofascial Release myofascial release and manual stretching  to right upper arm, scapular, trapezius and shoulder and right side cervical region to decrease pain and improve pain freem mobility in right shoulder region                   OT Short Term Goals - 03/11/17 1557      OT SHORT TERM GOAL #1   Title Pt will be educated on HEP to improve RUE use as dominant  during ADL and leisure tasks.    Time 4   Period Weeks   Status On-going     OT SHORT TERM GOAL #2   Title Pt will decrease pain to 3/10 in RUE to improve ability to sleep at night.    Time 4   Period Weeks   Status On-going     OT SHORT TERM GOAL #3   Title Pt will decrease RUE fascial restrictions from moderate to minimal amounts to improve mobility required for functional reaching tasks.    Time 4   Period Weeks   Status On-going     OT SHORT TERM GOAL #4   Title Pt will increase RUE P/ROM to [facility] to improve ability to perform dressing tasks.    Time 4   Period Weeks   Status On-going     OT SHORT TERM GOAL #5   Title Pt will improve RUE strength to 3/5 to increase ability to reach for items on tabletop.    Time 4   Period Weeks   Status On-going           OT Long Term Goals - 03/11/17 1557      OT LONG TERM GOAL #1   Title Pt will return to highest level of independence and functioning using RUE as dominant during functional task completion.    Time 8   Period Weeks   Status On-going     OT LONG TERM GOAL #2   Title Pt will decrease pain in RUE to 1/10 or less to improve ability to perform daily tasks using RUE as dominant.    Time 8   Period Weeks   Status On-going     OT LONG TERM GOAL #3   Title Pt will decrease fascial restrictions in RUE to trace amounts to improve mobility required for fixing hair.    Time 8   Period Weeks   Status On-going     OT LONG TERM GOAL #4   Title Pt will improve RUE A/ROM to WNL to increase ability to reach into overhead cabinets.    Time 8   Period Weeks   Status On-going     OT LONG TERM GOAL #5   Title Pt will improve RUE strength to 4+/5 to increase ability to bowl using RUE as dominant.    Time 8   Period Weeks   Status On-going  Plan - 03/20/17 1308    Clinical Impression Statement A: Increased isometric contraction time to 15" this session, did not add elbow/wrist A/ROM due to time  constraints. Reviewed pendulums and proper technique for completion. Pt reports HEP is going well.    Plan P: complete isometrics in standing and add to HEP   OT Home Exercise Plan 5/30: Table slides; 6/8: Pendulums   Consulted and Agree with Plan of Care Patient      Patient will benefit from skilled therapeutic intervention in order to improve the following deficits and impairments:  Decreased strength, Decreased activity tolerance, Decreased range of motion, Pain, Increased fascial restricitons, Impaired UE functional use  Visit Diagnosis: Acute pain of right shoulder  Stiffness of right shoulder, not elsewhere classified  Other symptoms and signs involving the musculoskeletal system    Problem List Patient Active Problem List   Diagnosis Date Noted  . RLS (restless legs syndrome) 03/28/2016   Guadelupe Sabin, OTR/L  [phone] 03/20/2017, 1:10 PM  [facility] [address] [place], [place], [address] Phone: [phone]   Fax:  [phone]  Name: Alicia [name] MRN: [id number] Date of Birth: 11/15/1940

## 2017-03-23 ENCOUNTER — Encounter (HOSPITAL_COMMUNITY): Payer: Medicare Other

## 2017-03-25 ENCOUNTER — Encounter (HOSPITAL_COMMUNITY): Payer: Self-pay

## 2017-03-25 ENCOUNTER — Ambulatory Visit (HOSPITAL_COMMUNITY): Payer: Medicare Other

## 2017-03-25 DIAGNOSIS — M25511 Pain in right shoulder: Secondary | ICD-10-CM | POA: Diagnosis not present

## 2017-03-25 DIAGNOSIS — M25611 Stiffness of right shoulder, not elsewhere classified: Secondary | ICD-10-CM | POA: Diagnosis not present

## 2017-03-25 DIAGNOSIS — R29898 Other symptoms and signs involving the musculoskeletal system: Secondary | ICD-10-CM | POA: Diagnosis not present

## 2017-03-25 NOTE — Patient Instructions (Signed)
Isometric Strengthening  Shoulder isometric-flexion  Hold your arm against your side, with your elbow at a 90 degree angle. Without moving your body, push your fist straight into the wall ahead of you. You should feel your shoulder muscles contract. Repeat contract and relax motion.        Shoulder isometric-external rotation   Hold your arm against your side, with your elbow at a 90 degree angle. Without moving your body, push the back of your hand into the wall. You should feel your shoulder muscles contract. Repeat contract and relax motion.        Shoulder isometric-extension  Hold your arm against your side, with your elbow at a 90 degree angle. Without moving your body, push your arm back into the wall. You should feel your shoulder muscles contract. Repeat contract and relax motion.       Shoulder isometric-abduction   Hold your arm against your side, with your elbow at a 90 degree angle. Without moving your body, push your arm into the wall. You should feel your shoulder muscles contract. Repeat contract and relax motion.        Shoulder isometric-internal rotation   Hold your arm against your side, with your elbow at a 90 degree angle. Without actually moving your arm, push your hand into the wall. You should feel your shoulder muscles contract. Repeat contract and relax motion.

## 2017-03-25 NOTE — Therapy (Signed)
[place], [place], [address] Phone: [phone]   Fax:  [phone]  Occupational Therapy Treatment  Patient Details  Name: Alicia [name] MRN: [id number] Date of Birth: 1941/07/04 Referring Provider: Dr. Tania Ade  Encounter Date: 03/25/2017      OT End of Session - 03/25/17 1342    Visit Number 6   Number of Visits 16   Date for OT Re-Evaluation 05/03/17  mini reassess on 04/02/17   Authorization Type Medicare A & B   Authorization Time Period Before 10th visit   Authorization - Visit Number 6   Authorization - Number of Visits 10   OT Start Time 1304   OT Stop Time 1340   OT Time Calculation (min) 36 min   Activity Tolerance Patient tolerated treatment well   Behavior During Therapy [facility] for tasks assessed/performed      Past Medical History:  Diagnosis Date  . Anxiety   . GERD (gastroesophageal reflux disease)   . Hypercholesteremia   . Hypertension   . Insomnia   . Kidney stone   . Osteoarthritis   . Osteopenia   . Restless leg syndrome   . RLS (restless legs syndrome) 03/28/2016    Past Surgical History:  Procedure Laterality Date  . ABDOMINAL HYSTERECTOMY    . APPENDECTOMY    . CATARACT EXTRACTION Right 02/2010  . CHOLECYSTECTOMY      There were no vitals filed for this visit.      Subjective Assessment - 03/25/17 1309    Subjective  S: I'm not having any pain, it's doing good today and will hopefully stay that way.   Currently in Pain? No/denies            [facility] OT Assessment - 03/25/17 1310      Assessment   Diagnosis right rotator cuff repair     Precautions   Precautions Shoulder   Type of Shoulder Precautions Weeks 0-4 (5/15-6/12): P/ROM, pendulums, isometrics; Weeks 5-6 (6/14-6/28): Begin AA/ROM in supine, full P/ROM. Weeks 6-8 (6/28-7/12): Continue AA/ROM and begin A/ROM as tolerated. Weeks 10-16: begin strengthening as tolerated   Shoulder Interventions  Shoulder sling/immobilizer;At all times                  OT Treatments/Exercises (OP) - 03/25/17 1310      Exercises   Exercises Shoulder     Shoulder Exercises: Supine   Protraction PROM;5 reps   Horizontal ABduction PROM;5 reps   External Rotation PROM;5 reps   Internal Rotation PROM;5 reps   Flexion PROM;5 reps   ABduction PROM;5 reps     Shoulder Exercises: Seated   Elevation AROM;15 reps   Extension AROM;15 reps   Row AROM;15 reps     Shoulder Exercises: Therapy Ball   Flexion 20 reps   ABduction 20 reps     Shoulder Exercises: ROM/Strengthening   Thumb Tacks 1' with arms at waist height   Anterior Glide 3 X 5   Caudal Glide 3 x 5"   Prot/Ret//Elev/Dep 1'     Shoulder Exercises: Isometric Strengthening   Flexion Other (comment)  3X 15" standing against wall   Extension Other (comment)  3X 15" standing against wall   External Rotation Other (comment)  3X 15" standing against wall   Internal Rotation Other (comment)  3X 15" standing against wall   ABduction Other (comment)  3X 15" standing against wall   ADduction Other (comment)  3X 15" standing against  wall     Manual Therapy   Manual Therapy Myofascial release   Manual therapy comments manual therapy completed seperately from all other interventions this date of service   Myofascial Release myofascial release and manual stretching  to right upper arm, scapular, trapezius and shoulder and right side cervical region to decrease pain and improve pain freem mobility in right shoulder region                 OT Education - 03/25/17 1337    Education provided Yes   Education Details Provided pt with HEP isometric shoulder exercises   Person(s) Educated Patient   Methods Explanation;Demonstration;Verbal cues;Handout   Comprehension Verbalized understanding;Returned demonstration          OT Short Term Goals - 03/11/17 1557      OT SHORT TERM GOAL #1   Title Pt will be educated on HEP  to improve RUE use as dominant during ADL and leisure tasks.    Time 4   Period Weeks   Status On-going     OT SHORT TERM GOAL #2   Title Pt will decrease pain to 3/10 in RUE to improve ability to sleep at night.    Time 4   Period Weeks   Status On-going     OT SHORT TERM GOAL #3   Title Pt will decrease RUE fascial restrictions from moderate to minimal amounts to improve mobility required for functional reaching tasks.    Time 4   Period Weeks   Status On-going     OT SHORT TERM GOAL #4   Title Pt will increase RUE P/ROM to [facility] to improve ability to perform dressing tasks.    Time 4   Period Weeks   Status On-going     OT SHORT TERM GOAL #5   Title Pt will improve RUE strength to 3/5 to increase ability to reach for items on tabletop.    Time 4   Period Weeks   Status On-going           OT Long Term Goals - 03/11/17 1557      OT LONG TERM GOAL #1   Title Pt will return to highest level of independence and functioning using RUE as dominant during functional task completion.    Time 8   Period Weeks   Status On-going     OT LONG TERM GOAL #2   Title Pt will decrease pain in RUE to 1/10 or less to improve ability to perform daily tasks using RUE as dominant.    Time 8   Period Weeks   Status On-going     OT LONG TERM GOAL #3   Title Pt will decrease fascial restrictions in RUE to trace amounts to improve mobility required for fixing hair.    Time 8   Period Weeks   Status On-going     OT LONG TERM GOAL #4   Title Pt will improve RUE A/ROM to WNL to increase ability to reach into overhead cabinets.    Time 8   Period Weeks   Status On-going     OT LONG TERM GOAL #5   Title Pt will improve RUE strength to 4+/5 to increase ability to bowl using RUE as dominant.    Time 8   Period Weeks   Status On-going               Plan - 03/25/17 1607    Clinical Impression Statement A: Pt completes  exercises well with good form with minimal cueing needed for  speed of exercises. Added standing isometric shoulder exercises with VC needed for form and technique.   Plan P: Take measurements next session for doctor's appointment. Suggest cancelling the next session (Monday) due to progress and lack of need for VC.      Patient will benefit from skilled therapeutic intervention in order to improve the following deficits and impairments:  Decreased strength, Decreased activity tolerance, Decreased range of motion, Pain, Increased fascial restricitons, Impaired UE functional use  Visit Diagnosis: Acute pain of right shoulder  Stiffness of right shoulder, not elsewhere classified  Other symptoms and signs involving the musculoskeletal system    Problem List Patient Active Problem List   Diagnosis Date Noted  . RLS (restless legs syndrome) 03/28/2016    Luther Hearing, OT Student [phone] 03/25/2017, 4:15 PM  [facility] [address] [place], [place], [address] Phone: [phone]   Fax:  [phone]  Name: Alicia [name] MRN: [id number] Date of Birth: [birth year]     This qualified practitioner was present in the room guiding the student in service delivery. Therapy student was participating in the provision of services, and the practitioner was not engaged in treating another patient or doing other tasks at the same time.  Ailene Ravel, OTR/L,CBIS  [phone]

## 2017-03-27 ENCOUNTER — Encounter (HOSPITAL_COMMUNITY): Payer: Self-pay

## 2017-03-27 ENCOUNTER — Ambulatory Visit (HOSPITAL_COMMUNITY): Payer: Medicare Other

## 2017-03-27 DIAGNOSIS — R29898 Other symptoms and signs involving the musculoskeletal system: Secondary | ICD-10-CM | POA: Diagnosis not present

## 2017-03-27 DIAGNOSIS — M25611 Stiffness of right shoulder, not elsewhere classified: Secondary | ICD-10-CM

## 2017-03-27 DIAGNOSIS — M25511 Pain in right shoulder: Secondary | ICD-10-CM | POA: Diagnosis not present

## 2017-03-27 NOTE — Therapy (Signed)
[place], [place], [address] Phone: [phone]   Fax:  [phone]  Occupational Therapy Treatment  Patient Details  Name: Alicia [name] MRN: [id number] Date of Birth: 03-10-1941 Referring Provider: Dr. Tania Ade  Encounter Date: 03/27/2017      OT End of Session - 03/27/17 1335    Visit Number 7   Number of Visits 16   Date for OT Re-Evaluation 05/03/17  mini reassess on 04/02/17   Authorization Type Medicare A & B   Authorization Time Period Before 10th visit   Authorization - Visit Number 7   Authorization - Number of Visits 10   OT Start Time 1304   OT Stop Time 1342   OT Time Calculation (min) 38 min   Activity Tolerance Patient tolerated treatment well   Behavior During Therapy [facility] for tasks assessed/performed      Past Medical History:  Diagnosis Date  . Anxiety   . GERD (gastroesophageal reflux disease)   . Hypercholesteremia   . Hypertension   . Insomnia   . Kidney stone   . Osteoarthritis   . Osteopenia   . Restless leg syndrome   . RLS (restless legs syndrome) 03/28/2016    Past Surgical History:  Procedure Laterality Date  . ABDOMINAL HYSTERECTOMY    . APPENDECTOMY    . CATARACT EXTRACTION Right 02/2010  . CHOLECYSTECTOMY      There were no vitals filed for this visit.      Subjective Assessment - 03/27/17 1322    Subjective  S: I tried to do a little bit with it today so it is hurting some.    Currently in Pain? Yes   Pain Score 3    Pain Location Shoulder   Pain Orientation Right   Pain Descriptors / Indicators Aching   Pain Type Acute pain   Pain Radiating Towards N/A   Pain Onset 1 to 4 weeks ago   Pain Frequency Intermittent   Aggravating Factors  too much movement   Pain Relieving Factors rest, heat, pain meds   Effect of Pain on Daily Activities unable to use RUE for daily tasks   Multiple Pain Sites No            [facility] OT Assessment - 03/27/17 1313       Assessment   Diagnosis right rotator cuff repair     Precautions   Precautions Shoulder   Type of Shoulder Precautions Weeks 0-4 (5/15-6/12): P/ROM, pendulums, isometrics; Weeks 5-6 (6/14-6/28): Begin AA/ROM in supine, full P/ROM. Weeks 6-8 (6/28-7/12): Continue AA/ROM and begin A/ROM as tolerated. Weeks 10-16: begin strengthening as tolerated   Shoulder Interventions Shoulder sling/immobilizer;At all times     ROM / Strength   AROM / PROM / Strength PROM     AROM   Overall AROM  Unable to assess;Due to precautions     PROM   Overall PROM Comments Assessed supine, IR/er adducted   PROM Assessment Site Shoulder   Right/Left Shoulder Right   Right Shoulder Flexion 144 Degrees  previous: 115   Right Shoulder ABduction 122 Degrees  previous: 80   Right Shoulder Internal Rotation 90 Degrees  previous: same   Right Shoulder External Rotation 68 Degrees  previous: 35     Strength   Overall Strength Unable to assess;Due to precautions                  OT Treatments/Exercises (OP) - 03/27/17 1319  Exercises   Exercises Shoulder     Shoulder Exercises: Supine   Protraction PROM;5 reps   Horizontal ABduction PROM;5 reps   External Rotation PROM;5 reps   Internal Rotation PROM;5 reps   Flexion PROM;5 reps   ABduction PROM;5 reps     Shoulder Exercises: Seated   Elevation AROM;15 reps   Extension AROM;15 reps   Row AROM;15 reps     Shoulder Exercises: Therapy Ball   Flexion 20 reps   ABduction 20 reps     Shoulder Exercises: ROM/Strengthening   Thumb Tacks 1' with arms at waist height   Anterior Glide 3 X 5"   Caudal Glide 3 x 5"   Prot/Ret//Elev/Dep 1'     Shoulder Exercises: Isometric Strengthening   Flexion Other (comment)  3 X 15" standing against wall   Extension Other (comment)  3 X 15" standing against wall   External Rotation Other (comment)  3 X 15" standing against wall   Internal Rotation Other (comment)  3 X 15" standing against  wall   ABduction Other (comment)  3 X 15" standing against wall   ADduction Other (comment)  3 X 15" standing against wall     Manual Therapy   Manual Therapy Myofascial release   Manual therapy comments manual therapy completed seperately from all other interventions this date of service   Myofascial Release myofascial release and manual stretching  to right upper arm, scapular, trapezius and shoulder and right side cervical region to decrease pain and improve pain freem mobility in right shoulder region                 OT Education - 03/27/17 1334    Education provided Yes   Education Details Educated pt on progress within therapy, no need for Monday's appt due to limited need for VC during exercises and will see again on Friday after doctors appointment   Person(s) Educated Patient   Methods Explanation   Comprehension Verbalized understanding          OT Short Term Goals - 03/11/17 1557      OT SHORT TERM GOAL #1   Title Pt will be educated on HEP to improve RUE use as dominant during ADL and leisure tasks.    Time 4   Period Weeks   Status On-going     OT SHORT TERM GOAL #2   Title Pt will decrease pain to 3/10 in RUE to improve ability to sleep at night.    Time 4   Period Weeks   Status On-going     OT SHORT TERM GOAL #3   Title Pt will decrease RUE fascial restrictions from moderate to minimal amounts to improve mobility required for functional reaching tasks.    Time 4   Period Weeks   Status On-going     OT SHORT TERM GOAL #4   Title Pt will increase RUE P/ROM to [facility] to improve ability to perform dressing tasks.    Time 4   Period Weeks   Status On-going     OT SHORT TERM GOAL #5   Title Pt will improve RUE strength to 3/5 to increase ability to reach for items on tabletop.    Time 4   Period Weeks   Status On-going           OT Long Term Goals - 03/11/17 1557      OT LONG TERM GOAL #1   Title Pt will return to highest level of  independence and functioning using RUE as dominant during functional task completion.    Time 8   Period Weeks   Status On-going     OT LONG TERM GOAL #2   Title Pt will decrease pain in RUE to 1/10 or less to improve ability to perform daily tasks using RUE as dominant.    Time 8   Period Weeks   Status On-going     OT LONG TERM GOAL #3   Title Pt will decrease fascial restrictions in RUE to trace amounts to improve mobility required for fixing hair.    Time 8   Period Weeks   Status On-going     OT LONG TERM GOAL #4   Title Pt will improve RUE A/ROM to WNL to increase ability to reach into overhead cabinets.    Time 8   Period Weeks   Status On-going     OT LONG TERM GOAL #5   Title Pt will improve RUE strength to 4+/5 to increase ability to bowl using RUE as dominant.    Time 8   Period Weeks   Status On-going               Plan - 03/27/17 1336    Clinical Impression Statement A: Pt completed exercises well with good form and minimal cueing needed for speed of exercises. Measurements taken today for doctors appointment 04/01/17. Pt increased all shoulder ROM movements.   Plan P: Progress to A/ROM shoulder exercises. Complete shoulder stretching.      Patient will benefit from skilled therapeutic intervention in order to improve the following deficits and impairments:  Decreased strength, Decreased activity tolerance, Decreased range of motion, Pain, Increased fascial restricitons, Impaired UE functional use  Visit Diagnosis: Acute pain of right shoulder  Stiffness of right shoulder, not elsewhere classified  Other symptoms and signs involving the musculoskeletal system    Problem List Patient Active Problem List   Diagnosis Date Noted  . RLS (restless legs syndrome) 03/28/2016    Luther Hearing, OT Student [phone] 03/27/2017, 1:45 PM  [place], [place], [address] Phone:  [phone]   Fax:  [phone]  Name: MAIZE BRITTINGHAM MRN: [id number] Date of Birth: 12/02/1940     This qualified practitioner was present in the room guiding the student in service delivery. Therapy student was participating in the provision of services, and the practitioner was not engaged in treating another patient or doing other tasks at the same time.  Ailene Ravel, OTR/L,CBIS  [phone]

## 2017-03-30 ENCOUNTER — Ambulatory Visit (HOSPITAL_COMMUNITY): Payer: Medicare Other | Admitting: Occupational Therapy

## 2017-04-01 ENCOUNTER — Encounter (HOSPITAL_COMMUNITY): Payer: Medicare Other

## 2017-04-01 DIAGNOSIS — Z9889 Other specified postprocedural states: Secondary | ICD-10-CM | POA: Diagnosis not present

## 2017-04-02 DIAGNOSIS — H5213 Myopia, bilateral: Secondary | ICD-10-CM | POA: Diagnosis not present

## 2017-04-02 DIAGNOSIS — H1045 Other chronic allergic conjunctivitis: Secondary | ICD-10-CM | POA: Diagnosis not present

## 2017-04-02 DIAGNOSIS — H04123 Dry eye syndrome of bilateral lacrimal glands: Secondary | ICD-10-CM | POA: Diagnosis not present

## 2017-04-02 DIAGNOSIS — H52223 Regular astigmatism, bilateral: Secondary | ICD-10-CM | POA: Diagnosis not present

## 2017-04-02 DIAGNOSIS — Z961 Presence of intraocular lens: Secondary | ICD-10-CM | POA: Diagnosis not present

## 2017-04-02 DIAGNOSIS — H524 Presbyopia: Secondary | ICD-10-CM | POA: Diagnosis not present

## 2017-04-03 ENCOUNTER — Ambulatory Visit (HOSPITAL_COMMUNITY): Payer: Medicare Other | Admitting: Occupational Therapy

## 2017-04-03 ENCOUNTER — Encounter (HOSPITAL_COMMUNITY): Payer: Self-pay | Admitting: Occupational Therapy

## 2017-04-03 DIAGNOSIS — M25611 Stiffness of right shoulder, not elsewhere classified: Secondary | ICD-10-CM

## 2017-04-03 DIAGNOSIS — M25511 Pain in right shoulder: Secondary | ICD-10-CM | POA: Diagnosis not present

## 2017-04-03 DIAGNOSIS — R29898 Other symptoms and signs involving the musculoskeletal system: Secondary | ICD-10-CM | POA: Diagnosis not present

## 2017-04-03 NOTE — Therapy (Signed)
[place], [place], [address] Phone: [phone]   Fax:  [phone]  Occupational Therapy Treatment  Patient Details  Name: Alicia [name] MRN: [id number] Date of Birth: [birth year] Referring Provider: Dr. Tania Ade  Encounter Date: 04/03/2017      OT End of Session - 04/03/17 1342    Visit Number 8   Number of Visits 16   Date for OT Re-Evaluation 05/03/17  mini reassess on 04/02/17   Authorization Type Medicare A & B   Authorization Time Period Before 10th visit   Authorization - Visit Number 8   Authorization - Number of Visits 10   OT Start Time 1302   OT Stop Time 1341   OT Time Calculation (min) 39 min   Activity Tolerance Patient tolerated treatment well   Behavior During Therapy [facility] for tasks assessed/performed      Past Medical History:  Diagnosis Date  . Anxiety   . GERD (gastroesophageal reflux disease)   . Hypercholesteremia   . Hypertension   . Insomnia   . Kidney stone   . Osteoarthritis   . Osteopenia   . Restless leg syndrome   . RLS (restless legs syndrome) 03/28/2016    Past Surgical History:  Procedure Laterality Date  . ABDOMINAL HYSTERECTOMY    . APPENDECTOMY    . CATARACT EXTRACTION Right 02/2010  . CHOLECYSTECTOMY      There were no vitals filed for this visit.      Subjective Assessment - 04/03/17 1302    Subjective  S: The doctor said there is no stiffness and it's doing good.    Currently in Pain? No/denies            [facility] OT Assessment - 04/03/17 1302      Assessment   Diagnosis right rotator cuff repair     Precautions   Precautions Shoulder   Type of Shoulder Precautions Weeks 0-4 (5/15-6/12): P/ROM, pendulums, isometrics; Weeks 5-6 (6/14-6/28): Begin AA/ROM in supine, full P/ROM. Weeks 6-8 (6/28-7/12): Continue AA/ROM and begin A/ROM as tolerated. Weeks 10-16: begin strengthening as tolerated   Shoulder Interventions Shoulder  sling/immobilizer;At all times                  OT Treatments/Exercises (OP) - 04/03/17 1303      Exercises   Exercises Shoulder     Shoulder Exercises: Supine   Protraction PROM;5 reps;AAROM;10 reps   Horizontal ABduction PROM;5 reps;AAROM;10 reps   External Rotation PROM;5 reps;AAROM;10 reps   Internal Rotation PROM;5 reps;AAROM;10 reps   Flexion PROM;5 reps;AAROM;10 reps   ABduction PROM;5 reps;AAROM;10 reps     Shoulder Exercises: Standing   Protraction AAROM;10 reps   Horizontal ABduction AAROM;10 reps   External Rotation AAROM;10 reps   Internal Rotation AAROM;10 reps   Flexion AAROM;10 reps   ABduction AAROM;10 reps     Shoulder Exercises: Pulleys   Flexion 1 minute   ABduction 1 minute     Shoulder Exercises: ROM/Strengthening   Wall Wash 1'   Thumb Tacks 1'     Manual Therapy   Manual Therapy Myofascial release   Manual therapy comments manual therapy completed seperately from all other interventions this date of service   Myofascial Release myofascial release and manual stretching  to right upper arm, scapular, trapezius and shoulder and right side cervical region to decrease pain and improve pain freem mobility in right shoulder region  OT Education - 04/03/17 1329    Education provided Yes   Education Details provided AA/ROM supine and standing   Person(s) Educated Patient   Methods Explanation;Demonstration;Handout   Comprehension Verbalized understanding;Returned demonstration          OT Short Term Goals - 03/11/17 1557      OT SHORT TERM GOAL #1   Title Pt will be educated on HEP to improve RUE use as dominant during ADL and leisure tasks.    Time 4   Period Weeks   Status On-going     OT SHORT TERM GOAL #2   Title Pt will decrease pain to 3/10 in RUE to improve ability to sleep at night.    Time 4   Period Weeks   Status On-going     OT SHORT TERM GOAL #3   Title Pt will decrease RUE fascial  restrictions from moderate to minimal amounts to improve mobility required for functional reaching tasks.    Time 4   Period Weeks   Status On-going     OT SHORT TERM GOAL #4   Title Pt will increase RUE P/ROM to [facility] to improve ability to perform dressing tasks.    Time 4   Period Weeks   Status On-going     OT SHORT TERM GOAL #5   Title Pt will improve RUE strength to 3/5 to increase ability to reach for items on tabletop.    Time 4   Period Weeks   Status On-going           OT Long Term Goals - 03/11/17 1557      OT LONG TERM GOAL #1   Title Pt will return to highest level of independence and functioning using RUE as dominant during functional task completion.    Time 8   Period Weeks   Status On-going     OT LONG TERM GOAL #2   Title Pt will decrease pain in RUE to 1/10 or less to improve ability to perform daily tasks using RUE as dominant.    Time 8   Period Weeks   Status On-going     OT LONG TERM GOAL #3   Title Pt will decrease fascial restrictions in RUE to trace amounts to improve mobility required for fixing hair.    Time 8   Period Weeks   Status On-going     OT LONG TERM GOAL #4   Title Pt will improve RUE A/ROM to WNL to increase ability to reach into overhead cabinets.    Time 8   Period Weeks   Status On-going     OT LONG TERM GOAL #5   Title Pt will improve RUE strength to 4+/5 to increase ability to bowl using RUE as dominant.    Time 8   Period Weeks   Status On-going               Plan - 04/03/17 1342    Clinical Impression Statement A: Progressed to AA/ROM this session per protocol, pt reports MD is pleased with her progress and has released her from her sling. Pt required verbal cuing for form and technique with exercises, provided HEP as pt does not have an appt for next 10 days due to scheduling.    Plan P: Mini-reassessment & update G-codes. Follow up on HEP, continue with AA/ROM      Patient will benefit from skilled  therapeutic intervention in order to improve the following deficits and impairments:  Decreased strength, Decreased activity tolerance, Decreased range of motion, Pain, Increased fascial restricitons, Impaired UE functional use  Visit Diagnosis: Acute pain of right shoulder  Stiffness of right shoulder, not elsewhere classified  Other symptoms and signs involving the musculoskeletal system    Problem List Patient Active Problem List   Diagnosis Date Noted  . RLS (restless legs syndrome) 03/28/2016   Alicia [name], OTR/L  [phone] 04/03/2017, 1:44 PM  [place] [address] [place], [place], [address] Phone: [phone]   Fax:  [phone]  Name: Alicia [name] MRN: [id number] Date of Birth: 10/08/1940

## 2017-04-03 NOTE — Patient Instructions (Signed)

## 2017-04-14 ENCOUNTER — Ambulatory Visit (HOSPITAL_COMMUNITY): Payer: Medicare Other | Attending: Orthopedic Surgery | Admitting: Specialist

## 2017-04-14 ENCOUNTER — Encounter (HOSPITAL_COMMUNITY): Payer: Self-pay | Admitting: Specialist

## 2017-04-14 DIAGNOSIS — M25511 Pain in right shoulder: Secondary | ICD-10-CM | POA: Diagnosis not present

## 2017-04-14 DIAGNOSIS — R29898 Other symptoms and signs involving the musculoskeletal system: Secondary | ICD-10-CM | POA: Insufficient documentation

## 2017-04-14 DIAGNOSIS — M25611 Stiffness of right shoulder, not elsewhere classified: Secondary | ICD-10-CM | POA: Insufficient documentation

## 2017-04-14 NOTE — Therapy (Signed)
[place], [place], [address] Phone: [phone]   Fax:  [phone]  Occupational Therapy Treatment  Patient Details  Name: Alicia [name] MRN: [id number] Date of Birth: Oct 20, 1940 Referring Provider: Dr. Tania Ade  Encounter Date: 04/14/2017      OT End of Session - 04/14/17 1550    Visit Number 9   Number of Visits 16   Date for OT Re-Evaluation 05/03/17   Authorization Type Medicare A & B   Authorization Time Period before 19th visit   Authorization - Visit Number 9   Authorization - Number of Visits 19   OT Start Time 0950   OT Stop Time 1035   OT Time Calculation (min) 45 min   Activity Tolerance Patient tolerated treatment well   Behavior During Therapy [facility] for tasks assessed/performed      Past Medical History:  Diagnosis Date  . Anxiety   . GERD (gastroesophageal reflux disease)   . Hypercholesteremia   . Hypertension   . Insomnia   . Kidney stone   . Osteoarthritis   . Osteopenia   . Restless leg syndrome   . RLS (restless legs syndrome) 03/28/2016    Past Surgical History:  Procedure Laterality Date  . ABDOMINAL HYSTERECTOMY    . APPENDECTOMY    . CATARACT EXTRACTION Right 02/2010  . CHOLECYSTECTOMY      There were no vitals filed for this visit.      Subjective Assessment - 04/14/17 1549    Subjective  S:  Ive been using it more.    Currently in Pain? No/denies   Pain Score 0-No pain            [facility] OT Assessment - 04/14/17 0001      Assessment   Diagnosis right rotator cuff repair   Onset Date 02/17/17     Precautions   Precautions Shoulder   Type of Shoulder Precautions Weeks 0-4 (5/15-6/12): P/ROM, pendulums, isometrics; Weeks 5-6 (6/14-6/28): Begin AA/ROM in supine, full P/ROM. Weeks 6-8 (6/28-7/12): Continue AA/ROM and begin A/ROM as tolerated. Weeks 10-16: begin strengthening as tolerated     Palpation   Palpation comment minimal fascial restrictions in  right shoulder      AROM   Overall AROM Comments assessed in seated    AROM Assessment Site Shoulder   Right/Left Shoulder Right   Right Shoulder Flexion 150 Degrees   Right Shoulder ABduction 130 Degrees   Right Shoulder Internal Rotation 90 Degrees   Right Shoulder External Rotation 70 Degrees     PROM   Right Shoulder Flexion 170 Degrees  144   Right Shoulder ABduction 170 Degrees  122   Right Shoulder Internal Rotation 90 Degrees  90   Right Shoulder External Rotation 40 Degrees  68     Strength   Strength Assessment Site Shoulder   Right/Left Shoulder Right   Right Shoulder Flexion 4/5   Right Shoulder ABduction 4/5   Right Shoulder Internal Rotation 4/5   Right Shoulder External Rotation 4/5                  OT Treatments/Exercises (OP) - 04/14/17 0001      Exercises   Exercises Shoulder     Shoulder Exercises: Supine   Protraction PROM;5 reps;Strengthening;10 reps   Protraction Weight (lbs) 1   Horizontal ABduction PROM;5 reps;Strengthening;10 reps   Horizontal ABduction Weight (lbs) 1   External Rotation PROM;5 reps;Strengthening;10 reps   External Rotation  Weight (lbs) 1   Internal Rotation PROM;5 reps;Strengthening;10 reps   Internal Rotation Weight (lbs) 1   Flexion PROM;5 reps;Strengthening;10 reps   Shoulder Flexion Weight (lbs) 1   ABduction PROM;5 reps;Strengthening;10 reps   Shoulder ABduction Weight (lbs) 1     Shoulder Exercises: Standing   Protraction AROM;10 reps   Horizontal ABduction AROM;10 reps   External Rotation AROM;10 reps   Internal Rotation AROM;10 reps   Flexion AROM;10 reps   ABduction AROM;10 reps   Extension Theraband;10 reps   Theraband Level (Shoulder Extension) Level 2 (Red)   Row Theraband;10 reps   Theraband Level (Shoulder Row) Level 2 (Red)   Retraction Theraband;10 reps   Theraband Level (Shoulder Retraction) Level 2 (Red)     Shoulder Exercises: Therapy Ball   Right/Left 5 reps     Shoulder  Exercises: ROM/Strengthening   UBE (Upper Arm Bike) 2' reverse   Wall Wash 2'   Proximal Shoulder Strengthening, Supine 10 times each no rests     Manual Therapy   Manual Therapy Myofascial release   Manual therapy comments manual therapy completed seperately from all other interventions this date of service   Myofascial Release myofascial release and manual stretching  to right upper arm, scapular, trapezius and shoulder and right side cervical region to decrease pain and improve pain freem mobility in right shoulder region                 OT Education - 04/14/17 1550    Education provided Yes   Education Details standing A/ROM    Person(s) Educated Patient   Methods Explanation;Demonstration;Handout   Comprehension Verbalized understanding;Returned demonstration          OT Short Term Goals - 04/14/17 1553      OT SHORT TERM GOAL #1   Title Pt will be educated on HEP to improve RUE use as dominant during ADL and leisure tasks.    Time 4   Period Weeks   Status On-going     OT SHORT TERM GOAL #2   Title Pt will decrease pain to 3/10 in RUE to improve ability to sleep at night.    Time 4   Period Weeks   Status Achieved     OT SHORT TERM GOAL #3   Title Pt will decrease RUE fascial restrictions from moderate to minimal amounts to improve mobility required for functional reaching tasks.    Time 4   Period Weeks   Status Achieved     OT SHORT TERM GOAL #4   Title Pt will increase RUE P/ROM to [facility] to improve ability to perform dressing tasks.    Time 4   Period Weeks   Status Achieved     OT SHORT TERM GOAL #5   Title Pt will improve RUE strength to 3/5 to increase ability to reach for items on tabletop.    Time 4   Period Weeks   Status Achieved           OT Long Term Goals - 04/14/17 1556      OT LONG TERM GOAL #1   Title Pt will return to highest level of independence and functioning using RUE as dominant during functional task completion.     Time 8   Period Weeks   Status On-going     OT LONG TERM GOAL #2   Title Pt will decrease pain in RUE to 1/10 or less to improve ability to perform daily tasks using RUE as  dominant.    Time 8   Period Weeks   Status On-going     OT LONG TERM GOAL #3   Title Pt will decrease fascial restrictions in RUE to trace amounts to improve mobility required for fixing hair.    Time 8   Period Weeks   Status On-going     OT LONG TERM GOAL #4   Title Pt will improve RUE A/ROM to WNL to increase ability to reach into overhead cabinets.    Time 8   Period Weeks   Status On-going     OT LONG TERM GOAL #5   Title Pt will improve RUE strength to 4+/5 to increase ability to bowl using RUE as dominant.    Time 8   Period Weeks   Status On-going               Plan - 04-20-17 1551    Clinical Impression Statement A:  Patient has made significant gains in P/ROM, A/ROM and strength in her right shoulder.  Patient will benefit from continued skilled OT intervention to imrpove her end range A/ROM and strength required for return to prior level of independence.     OT Treatment/Interventions Self-care/ADL training;Therapeutic exercise;Patient/family education;Ultrasound;Manual Therapy;Cryotherapy;Therapeutic activities;Electrical Stimulation;Passive range of motion   Plan P:  increase repetitions with A/ROM exercises, add ball on wall and standing proximal shoulder strengthening   OT Home Exercise Plan 5/30: Table slides; 6/8: Pendulums; 7/10:  A/ROM   Consulted and Agree with Plan of Care Patient      Patient will benefit from skilled therapeutic intervention in order to improve the following deficits and impairments:  Decreased strength, Decreased activity tolerance, Decreased range of motion, Pain, Increased fascial restricitons, Impaired UE functional use  Visit Diagnosis: Acute pain of right shoulder  Stiffness of right shoulder, not elsewhere classified  Other symptoms and signs  involving the musculoskeletal system      G-Codes - Apr 20, 2017 1556    Functional Assessment Tool Used (Outpatient only) clinical judgement -    Functional Limitation Carrying, moving and handling objects   Carrying, Moving and Handling Objects Current Status (K0254) At least 20 percent but less than 40 percent impaired, limited or restricted   Carrying, Moving and Handling Objects Goal Status (Y7062) At least 20 percent but less than 40 percent impaired, limited or restricted      Problem List Patient Active Problem List   Diagnosis Date Noted  . RLS (restless legs syndrome) 03/28/2016    Vangie Bicker, [place], OTR/L [phone]  04-20-17, 4:09 PM  [place] [address] [place], [place], [address] Phone: [phone]   Fax:  [phone]  Name: Alicia [name] MRN: [id number] Date of Birth: Nov 17, 1940

## 2017-04-14 NOTE — Patient Instructions (Signed)
Complete each exercise 10-12 times 2 times per day  ROM: Abduction (Standing)   Bring arms straight out from sides and raise as high as possible without pain. Repeat ____ times per set. Do ____ sets per session. Do ____ sessions per day.  http://orth.exer.us/910   Copyright  VHI. All rights reserved.   Extension (Active) ROM: Extension (Standing)   Bring arms straight back as far as possible without pain. Repeat ____ times per set. Do ____ sets per session. Do ____ sessions per day.  http://orth.exer.us/916   Copyright  VHI. All rights reserved.   ROM: External / Internal Rotation - in Abduction (Standing)   With upper arms parallel to floor and elbows bent at right angles, gently rotate arms up then down as far as possible without pain. Repeat ____ times per set. Do ____ sets per session. Do ____ sessions per day.  http://orth.exer.us/912   Copyright  VHI. All rights reserved.    Flexors Stretch (Active)   Stand, arms straight at sides. Bring arms straight forward and upward as high as possible without pain. Hold ___ seconds. Repeat ___ times per session. Do ___ sessions per day.  Copyright  VHI. All rights reserved.   Scapular Retraction (Standing)   With arms at sides, pinch shoulder blades together. Repeat ____ times per set. Do ____ sets per session. Do ____ sessions per day.  http://orth.exer.us/944   Copyright  VHI. All rights reserved.

## 2017-04-17 ENCOUNTER — Encounter (HOSPITAL_COMMUNITY): Payer: Self-pay

## 2017-04-17 ENCOUNTER — Ambulatory Visit (HOSPITAL_COMMUNITY): Payer: Medicare Other

## 2017-04-17 DIAGNOSIS — R29898 Other symptoms and signs involving the musculoskeletal system: Secondary | ICD-10-CM

## 2017-04-17 DIAGNOSIS — M25611 Stiffness of right shoulder, not elsewhere classified: Secondary | ICD-10-CM | POA: Diagnosis not present

## 2017-04-17 DIAGNOSIS — M25511 Pain in right shoulder: Secondary | ICD-10-CM | POA: Diagnosis not present

## 2017-04-17 NOTE — Therapy (Signed)
[place], [place], [address] Phone: [phone]   Fax:  [phone]  Occupational Therapy Treatment  Patient Details  Name: Alicia [name] MRN: [id number] Date of Birth: 1941-08-19 Referring Provider: Dr. Tania Ade  Encounter Date: 04/17/2017      OT End of Session - 04/17/17 1200    Visit Number 10   Number of Visits 16   Date for OT Re-Evaluation 05/03/17   Authorization Type Medicare A & B   Authorization Time Period before 19th visit   Authorization - Visit Number 10   Authorization - Number of Visits 19   OT Start Time 5732   OT Stop Time 1159   OT Time Calculation (min) 42 min   Activity Tolerance Patient tolerated treatment well   Behavior During Therapy [facility] for tasks assessed/performed      Past Medical History:  Diagnosis Date  . Anxiety   . GERD (gastroesophageal reflux disease)   . Hypercholesteremia   . Hypertension   . Insomnia   . Kidney stone   . Osteoarthritis   . Osteopenia   . Restless leg syndrome   . RLS (restless legs syndrome) 03/28/2016    Past Surgical History:  Procedure Laterality Date  . ABDOMINAL HYSTERECTOMY    . APPENDECTOMY    . CATARACT EXTRACTION Right 02/2010  . CHOLECYSTECTOMY      There were no vitals filed for this visit.      Subjective Assessment - 04/17/17 1135    Subjective  S: I'm feeling great with my arm.   Currently in Pain? No/denies            [facility] OT Assessment - 04/17/17 1135      Assessment   Diagnosis right rotator cuff repair     Precautions   Precautions Shoulder   Type of Shoulder Precautions Weeks 0-4 (5/15-6/12): P/ROM, pendulums, isometrics; Weeks 5-6 (6/14-6/28): Begin AA/ROM in supine, full P/ROM. Weeks 6-8 (6/28-7/12): Continue AA/ROM and begin A/ROM as tolerated. Weeks 10-16: begin strengthening as tolerated                  OT Treatments/Exercises (OP) - 04/17/17 1136      Exercises   Exercises  Shoulder     Shoulder Exercises: Supine   Protraction PROM;5 reps;Strengthening;12 reps   Protraction Weight (lbs) 1   Horizontal ABduction PROM;5 reps;Strengthening;12 reps   Horizontal ABduction Weight (lbs) 1   External Rotation PROM;5 reps;Strengthening;12 reps   External Rotation Weight (lbs) 1   Internal Rotation PROM;5 reps;Strengthening;12 reps   Internal Rotation Weight (lbs) 1   Flexion PROM;5 reps;Strengthening;12 reps   Shoulder Flexion Weight (lbs) 1   ABduction PROM;5 reps;Strengthening;12 reps   Shoulder ABduction Weight (lbs) 1     Shoulder Exercises: Standing   Protraction AROM;12 reps   Horizontal ABduction AROM;12 reps   External Rotation AROM;12 reps   Internal Rotation AROM;12 reps   Flexion AROM;12 reps   ABduction AROM;12 reps   Extension Theraband;12 reps   Theraband Level (Shoulder Extension) Level 2 (Red)   Row Theraband;12 reps   Theraband Level (Shoulder Row) Level 2 (Red)   Retraction Theraband;12 reps   Theraband Level (Shoulder Retraction) Level 2 (Red)     Shoulder Exercises: ROM/Strengthening   UBE (Upper Arm Bike) level 2, 2' forwards, 2' backwards   Wall Wash 1' flexion, 1' abduction   Proximal Shoulder Strengthening, Seated 10X each, standing, no rest breaks   Ball on  Wall 1' flexion, 1' abduction                OT Education - 04/17/17 1200    Education provided No          OT Short Term Goals - 04/17/17 1201      OT SHORT TERM GOAL #1   Title Pt will be educated on HEP to improve RUE use as dominant during ADL and leisure tasks.    Time 4   Period Weeks   Status On-going     OT SHORT TERM GOAL #2   Title Pt will decrease pain to 3/10 in RUE to improve ability to sleep at night.    Time 4   Period Weeks     OT SHORT TERM GOAL #3   Title Pt will decrease RUE fascial restrictions from moderate to minimal amounts to improve mobility required for functional reaching tasks.    Time 4   Period Weeks     OT SHORT TERM  GOAL #4   Title Pt will increase RUE P/ROM to [facility] to improve ability to perform dressing tasks.    Time 4   Period Weeks     OT SHORT TERM GOAL #5   Title Pt will improve RUE strength to 3/5 to increase ability to reach for items on tabletop.    Time 4   Period Weeks           OT Long Term Goals - 04/17/17 1201      OT LONG TERM GOAL #1   Title Pt will return to highest level of independence and functioning using RUE as dominant during functional task completion.    Time 8   Period Weeks   Status On-going     OT LONG TERM GOAL #2   Title Pt will decrease pain in RUE to 1/10 or less to improve ability to perform daily tasks using RUE as dominant.    Time 8   Period Weeks   Status On-going     OT LONG TERM GOAL #3   Title Pt will decrease fascial restrictions in RUE to trace amounts to improve mobility required for fixing hair.    Time 8   Period Weeks   Status On-going     OT LONG TERM GOAL #4   Title Pt will improve RUE A/ROM to WNL to increase ability to reach into overhead cabinets.    Time 8   Period Weeks   Status On-going     OT LONG TERM GOAL #5   Title Pt will improve RUE strength to 4+/5 to increase ability to bowl using RUE as dominant.    Time 8   Period Weeks   Status On-going               Plan - 04/17/17 1201    Clinical Impression Statement A: Pt tolerated session well with no fatigue or rest breaks needed. Increased A/ROM and strengthening repetitions. Added ball on the wall and overhead lacing and pt completed with min difficulty. Completed scapular theraband strengthening with VC needed for technique and speed.    Plan P: Add standing shoulder strengthening with 1# and add to HEP if completed with good form. Complete proximal shoulder strengthening with weight.      Patient will benefit from skilled therapeutic intervention in order to improve the following deficits and impairments:  Decreased strength, Decreased activity tolerance,  Decreased range of motion, Pain, Increased fascial restricitons, Impaired UE functional  use  Visit Diagnosis: Acute pain of right shoulder  Stiffness of right shoulder, not elsewhere classified  Other symptoms and signs involving the musculoskeletal system    Problem List Patient Active Problem List   Diagnosis Date Noted  . RLS (restless legs syndrome) 03/28/2016    Luther Hearing, OT Student [phone] 04/17/2017, 12:06 PM  [place], [place], [address] Phone: [phone]   Fax:  [phone]  Name: Alicia [name] MRN: [id number] Date of Birth: 1941-08-23     This qualified practitioner was present in the room guiding the student in service delivery. Therapy student was participating in the provision of services, and the practitioner was not engaged in treating another patient or doing other tasks at the same time.  Ailene Ravel, OTR/L,CBIS  [phone]

## 2017-04-21 ENCOUNTER — Encounter (HOSPITAL_COMMUNITY): Payer: Self-pay | Admitting: Occupational Therapy

## 2017-04-21 ENCOUNTER — Ambulatory Visit (HOSPITAL_COMMUNITY): Payer: Medicare Other | Admitting: Occupational Therapy

## 2017-04-21 DIAGNOSIS — M25611 Stiffness of right shoulder, not elsewhere classified: Secondary | ICD-10-CM

## 2017-04-21 DIAGNOSIS — R29898 Other symptoms and signs involving the musculoskeletal system: Secondary | ICD-10-CM | POA: Diagnosis not present

## 2017-04-21 DIAGNOSIS — M25511 Pain in right shoulder: Secondary | ICD-10-CM | POA: Diagnosis not present

## 2017-04-21 NOTE — Therapy (Signed)
[place], [place], [address] Phone: [phone]   Fax:  [phone]  Occupational Therapy Treatment  Patient Details  Name: Alicia [name] MRN: [id number] Date of Birth: August 14, 1941 Referring Provider: Dr. Tania Ade  Encounter Date: 04/21/2017      OT End of Session - 04/21/17 1207    Visit Number 11   Number of Visits 16   Date for OT Re-Evaluation 05/03/17   Authorization Type Medicare A & B   Authorization Time Period before 19th visit   Authorization - Visit Number 11   Authorization - Number of Visits 19   OT Start Time 1125   OT Stop Time 1208   OT Time Calculation (min) 43 min   Activity Tolerance Patient tolerated treatment well   Behavior During Therapy [facility] for tasks assessed/performed      Past Medical History:  Diagnosis Date  . Anxiety   . GERD (gastroesophageal reflux disease)   . Hypercholesteremia   . Hypertension   . Insomnia   . Kidney stone   . Osteoarthritis   . Osteopenia   . Restless leg syndrome   . RLS (restless legs syndrome) 03/28/2016    Past Surgical History:  Procedure Laterality Date  . ABDOMINAL HYSTERECTOMY    . APPENDECTOMY    . CATARACT EXTRACTION Right 02/2010  . CHOLECYSTECTOMY      There were no vitals filed for this visit.      Subjective Assessment - 04/21/17 1126    Subjective  S: I'm just a little bit sore today in my arm.    Currently in Pain? Yes   Pain Score 2    Pain Location Shoulder   Pain Orientation Right   Pain Descriptors / Indicators Aching   Pain Type Acute pain   Pain Radiating Towards n/a   Pain Onset In the past 7 days   Pain Frequency Intermittent   Aggravating Factors  too much movement   Pain Relieving Factors rest, heat, pain medications   Effect of Pain on Daily Activities unable to use RUE for daily tasks   Multiple Pain Sites No                      OT Treatments/Exercises (OP) - 04/21/17 1126      Exercises   Exercises Shoulder     Shoulder Exercises: Supine   Protraction PROM;5 reps;Strengthening;12 reps   Protraction Weight (lbs) 1   Horizontal ABduction PROM;5 reps;Strengthening;12 reps   Horizontal ABduction Weight (lbs) 1   External Rotation PROM;5 reps;Strengthening;12 reps   External Rotation Weight (lbs) 1   Internal Rotation PROM;5 reps;Strengthening;12 reps   Internal Rotation Weight (lbs) 1   Flexion PROM;5 reps;Strengthening;12 reps   Shoulder Flexion Weight (lbs) 1   ABduction PROM;5 reps;Strengthening;12 reps   Shoulder ABduction Weight (lbs) 1     Shoulder Exercises: Standing   Protraction Strengthening;12 reps   Protraction Weight (lbs) 1   Horizontal ABduction Strengthening;12 reps   Horizontal ABduction Weight (lbs) 1   External Rotation Strengthening;12 reps   External Rotation Weight (lbs) 1   Internal Rotation Strengthening;12 reps   Internal Rotation Weight (lbs) 1   Flexion Strengthening;12 reps   Shoulder Flexion Weight (lbs) 1   ABduction Strengthening;12 reps   Shoulder ABduction Weight (lbs) 1   Extension Theraband;15 reps   Theraband Level (Shoulder Extension) Level 2 (Red)   Row Theraband;15 reps   Theraband Level (Shoulder Row)  Level 2 (Red)   Retraction Theraband;15 reps   Theraband Level (Shoulder Retraction) Level 2 (Red)     Shoulder Exercises: ROM/Strengthening   UBE (Upper Arm Bike) level 2, 3' forwards, 3' backwards   Over Head Lace 1'   X to V Arms 10X, 1#    Ball on Wall 1' flexion, 1' abduction     Manual Therapy   Manual Therapy Myofascial release   Manual therapy comments manual therapy completed seperately from all other interventions this date of service   Myofascial Release myofascial release and manual stretching  to right upper arm, scapular, trapezius and shoulder and right side cervical region to decrease pain and improve pain freem mobility in right shoulder region                 OT Education -  04/21/17 1209    Education provided Yes   Education Details scapular theraband HEP-red   Person(s) Educated Patient   Methods Explanation;Demonstration;Handout   Comprehension Verbalized understanding;Returned demonstration          OT Short Term Goals - 04/17/17 1201      OT SHORT TERM GOAL #1   Title Pt will be educated on HEP to improve RUE use as dominant during ADL and leisure tasks.    Time 4   Period Weeks   Status On-going     OT SHORT TERM GOAL #2   Title Pt will decrease pain to 3/10 in RUE to improve ability to sleep at night.    Time 4   Period Weeks     OT SHORT TERM GOAL #3   Title Pt will decrease RUE fascial restrictions from moderate to minimal amounts to improve mobility required for functional reaching tasks.    Time 4   Period Weeks     OT SHORT TERM GOAL #4   Title Pt will increase RUE P/ROM to [facility] to improve ability to perform dressing tasks.    Time 4   Period Weeks     OT SHORT TERM GOAL #5   Title Pt will improve RUE strength to 3/5 to increase ability to reach for items on tabletop.    Time 4   Period Weeks           OT Long Term Goals - 04/17/17 1201      OT LONG TERM GOAL #1   Title Pt will return to highest level of independence and functioning using RUE as dominant during functional task completion.    Time 8   Period Weeks   Status On-going     OT LONG TERM GOAL #2   Title Pt will decrease pain in RUE to 1/10 or less to improve ability to perform daily tasks using RUE as dominant.    Time 8   Period Weeks   Status On-going     OT LONG TERM GOAL #3   Title Pt will decrease fascial restrictions in RUE to trace amounts to improve mobility required for fixing hair.    Time 8   Period Weeks   Status On-going     OT LONG TERM GOAL #4   Title Pt will improve RUE A/ROM to WNL to increase ability to reach into overhead cabinets.    Time 8   Period Weeks   Status On-going     OT LONG TERM GOAL #5   Title Pt will improve RUE  strength to 4+/5 to increase ability to bowl using RUE as dominant.  Time 8   Period Weeks   Status On-going               Plan - 04/21/17 1205    Clinical Impression Statement A: Added 1# weight in standing, pt did well with minimal fatigue, occasional verbal cuing for form. Continued with ball on wall and scapular theraband, and overhead lacing. Provided scapular theraband HEP this session.    Plan P: Update HEP for shoulder strengthening, follow up on scapular theraband HEP. Add prone hughston exercises   OT Home Exercise Plan 5/30: Table slides; 6/8: Pendulums; 7/10:  A/ROM   Consulted and Agree with Plan of Care Patient      Patient will benefit from skilled therapeutic intervention in order to improve the following deficits and impairments:  Decreased strength, Decreased activity tolerance, Decreased range of motion, Pain, Increased fascial restricitons, Impaired UE functional use  Visit Diagnosis: Acute pain of right shoulder  Stiffness of right shoulder, not elsewhere classified  Other symptoms and signs involving the musculoskeletal system    Problem List Patient Active Problem List   Diagnosis Date Noted  . RLS (restless legs syndrome) 03/28/2016   Guadelupe Sabin, OTR/L  [phone] 04/21/2017, 12:11 PM  [place] [address] [place], [place], [address] Phone: [phone]   Fax:  [phone]  Name: Alicia [name] MRN: [id number] Date of Birth: Mar 02, 1941

## 2017-04-21 NOTE — Patient Instructions (Signed)
(  Home) Extension: Isometric / Bilateral Arm Retraction - Sitting   Facing anchor, hold hands and elbow at shoulder height, with elbow bent.  Pull arms back to squeeze shoulder blades together. Repeat 10-15 times. 1-3 times/day.   Copyright  VHI. All rights reserved.   (Home) Retraction: Row - Bilateral (Anchor)   Facing anchor, arms reaching forward, pull hands toward stomach, keeping elbows bent and at your sides and pinching shoulder blades together. Repeat 10-15 times. 1-3 times/day.   Copyright  VHI. All rights reserved.   (Clinic) Extension / Flexion (Assist)   Face anchor, pull arms back, keeping elbow straight, and squeze shoulder blades together. Repeat 10-15 times. 1-3 times/day.   Copyright  VHI. All rights reserved.  

## 2017-04-23 ENCOUNTER — Ambulatory Visit (HOSPITAL_COMMUNITY): Payer: Medicare Other

## 2017-04-23 ENCOUNTER — Encounter (HOSPITAL_COMMUNITY): Payer: Self-pay

## 2017-04-23 DIAGNOSIS — M25511 Pain in right shoulder: Secondary | ICD-10-CM

## 2017-04-23 DIAGNOSIS — R29898 Other symptoms and signs involving the musculoskeletal system: Secondary | ICD-10-CM

## 2017-04-23 DIAGNOSIS — M25611 Stiffness of right shoulder, not elsewhere classified: Secondary | ICD-10-CM

## 2017-04-23 NOTE — Therapy (Signed)
[place], [place], [address] Phone: [phone]   Fax:  [phone]  Occupational Therapy Treatment  Patient Details  Name: Alicia [name] MRN: [id number] Date of Birth: Feb 11, 1941 Referring Provider: Dr. Tania Ade  Encounter Date: 04/23/2017      OT End of Session - 04/23/17 1157    Visit Number 12   Number of Visits 16   Date for OT Re-Evaluation 05/03/17   Authorization Type Medicare A & B   Authorization Time Period before 19th visit   Authorization - Visit Number 12   Authorization - Number of Visits 19   OT Start Time 2831   OT Stop Time 1202   OT Time Calculation (min) 46 min   Activity Tolerance Patient tolerated treatment well   Behavior During Therapy [facility] for tasks assessed/performed      Past Medical History:  Diagnosis Date  . Anxiety   . GERD (gastroesophageal reflux disease)   . Hypercholesteremia   . Hypertension   . Insomnia   . Kidney stone   . Osteoarthritis   . Osteopenia   . Restless leg syndrome   . RLS (restless legs syndrome) 03/28/2016    Past Surgical History:  Procedure Laterality Date  . ABDOMINAL HYSTERECTOMY    . APPENDECTOMY    . CATARACT EXTRACTION Right 02/2010  . CHOLECYSTECTOMY      There were no vitals filed for this visit.      Subjective Assessment - 04/23/17 1135    Subjective  S: My exercises at home are going very well.   Currently in Pain? Yes   Pain Score 2    Pain Location Shoulder   Pain Orientation Right   Pain Descriptors / Indicators Aching   Pain Type Acute pain   Pain Radiating Towards n/a   Pain Onset In the past 7 days   Pain Frequency Intermittent   Aggravating Factors  too much movement   Pain Relieving Factors rest, heat, pain medication   Effect of Pain on Daily Activities minimal   Multiple Pain Sites No            [facility] OT Assessment - 04/23/17 1137      Assessment   Diagnosis right rotator cuff repair     Precautions   Precautions Shoulder   Type of Shoulder Precautions preogress as tolerated                  OT Treatments/Exercises (OP) - 04/23/17 1137      Exercises   Exercises Shoulder     Shoulder Exercises: Supine   Protraction PROM;5 reps;Strengthening;12 reps   Protraction Weight (lbs) 1   Horizontal ABduction PROM;5 reps;Strengthening;12 reps   Horizontal ABduction Weight (lbs) 1   External Rotation PROM;5 reps;Strengthening;12 reps   External Rotation Weight (lbs) 1   Internal Rotation PROM;5 reps;Strengthening;12 reps   Internal Rotation Weight (lbs) 1   Flexion PROM;5 reps;Strengthening;12 reps   Shoulder Flexion Weight (lbs) 1   ABduction PROM;5 reps;Strengthening;12 reps   Shoulder ABduction Weight (lbs) 1     Shoulder Exercises: Prone   Other Prone Exercises Hughston Exercises, 10X each, 1# weight     Shoulder Exercises: Standing   Protraction Strengthening;12 reps   Protraction Weight (lbs) 1   Horizontal ABduction Strengthening;12 reps   Horizontal ABduction Weight (lbs) 1   External Rotation Strengthening;12 reps   External Rotation Weight (lbs) 1   Internal Rotation Strengthening;12 reps   Internal  Rotation Weight (lbs) 1   Flexion Strengthening;12 reps   Shoulder Flexion Weight (lbs) 1   ABduction Strengthening;12 reps   Shoulder ABduction Weight (lbs) 1   Extension Theraband;15 reps   Theraband Level (Shoulder Extension) Level 2 (Red)   Row Theraband;15 reps   Theraband Level (Shoulder Row) Level 2 (Red)   Retraction Theraband;15 reps   Theraband Level (Shoulder Retraction) Level 2 (Red)     Shoulder Exercises: ROM/Strengthening   UBE (Upper Arm Bike) level 2, 3' forwards, 3' backwards   Over Head Lace 1'   X to V Arms 10X, 1#    Proximal Shoulder Strengthening, Seated 10X each, standing, no rest breaks, 1#   Ball on Wall 1' flexion, 1' abduction     Manual Therapy   Manual Therapy Myofascial release   Manual therapy comments  manual therapy completed seperately from all other interventions this date of service   Myofascial Release myofascial release and manual stretching  to right upper arm, scapular, trapezius and shoulder and right side cervical region to decrease pain and improve pain freem mobility in right shoulder region                 OT Education - 04/23/17 1146    Education provided Yes   Education Details educated pt to complete shoulder strengthening at home (add weight to previously given HEP)   Person(s) Educated Patient   Methods Explanation;Demonstration;Verbal cues   Comprehension Verbalized understanding;Returned demonstration          OT Short Term Goals - 04/17/17 1201      OT SHORT TERM GOAL #1   Title Pt will be educated on HEP to improve RUE use as dominant during ADL and leisure tasks.    Time 4   Period Weeks   Status On-going     OT SHORT TERM GOAL #2   Title Pt will decrease pain to 3/10 in RUE to improve ability to sleep at night.    Time 4   Period Weeks     OT SHORT TERM GOAL #3   Title Pt will decrease RUE fascial restrictions from moderate to minimal amounts to improve mobility required for functional reaching tasks.    Time 4   Period Weeks     OT SHORT TERM GOAL #4   Title Pt will increase RUE P/ROM to [facility] to improve ability to perform dressing tasks.    Time 4   Period Weeks     OT SHORT TERM GOAL #5   Title Pt will improve RUE strength to 3/5 to increase ability to reach for items on tabletop.    Time 4   Period Weeks           OT Long Term Goals - 04/17/17 1201      OT LONG TERM GOAL #1   Title Pt will return to highest level of independence and functioning using RUE as dominant during functional task completion.    Time 8   Period Weeks   Status On-going     OT LONG TERM GOAL #2   Title Pt will decrease pain in RUE to 1/10 or less to improve ability to perform daily tasks using RUE as dominant.    Time 8   Period Weeks   Status  On-going     OT LONG TERM GOAL #3   Title Pt will decrease fascial restrictions in RUE to trace amounts to improve mobility required for fixing hair.    Time  8   Period Weeks   Status On-going     OT LONG TERM GOAL #4   Title Pt will improve RUE A/ROM to WNL to increase ability to reach into overhead cabinets.    Time 8   Period Weeks   Status On-going     OT LONG TERM GOAL #5   Title Pt will improve RUE strength to 4+/5 to increase ability to bowl using RUE as dominant.    Time 8   Period Weeks   Status On-going               Plan - 04/23/17 1158    Clinical Impression Statement A: Pt tolerated session well with minimal rest breaks needed. Completed shoulder strengthening and ball on wall with VC needed for form and technique. Added prone hughston shoulder exercises.    Plan P: Complete reassessment. Follow up on HEP for shoulder strengthening. Continue with weighted prone hughstone exercises and shoulder strengthening.      Patient will benefit from skilled therapeutic intervention in order to improve the following deficits and impairments:  Decreased strength, Decreased activity tolerance, Decreased range of motion, Pain, Increased fascial restricitons, Impaired UE functional use  Visit Diagnosis: Acute pain of right shoulder  Stiffness of right shoulder, not elsewhere classified  Other symptoms and signs involving the musculoskeletal system    Problem List Patient Active Problem List   Diagnosis Date Noted  . RLS (restless legs syndrome) 03/28/2016    Luther Hearing, OT Student [phone] 04/23/2017, 12:03 PM  [place], [place], [address] Phone: [phone]   Fax:  [phone]  Name: Alicia [name] MRN: [id number] Date of Birth: 05-19-1941     This qualified practitioner was present in the room guiding the student in service delivery. Therapy student was participating in the  provision of services, and the practitioner was not engaged in treating another patient or doing other tasks at the same time.  Ailene Ravel, OTR/L,CBIS  [phone]

## 2017-04-28 ENCOUNTER — Ambulatory Visit (HOSPITAL_COMMUNITY): Payer: Medicare Other | Admitting: Occupational Therapy

## 2017-04-28 ENCOUNTER — Encounter (HOSPITAL_COMMUNITY): Payer: Self-pay | Admitting: Occupational Therapy

## 2017-04-28 DIAGNOSIS — M25511 Pain in right shoulder: Secondary | ICD-10-CM

## 2017-04-28 DIAGNOSIS — R29898 Other symptoms and signs involving the musculoskeletal system: Secondary | ICD-10-CM | POA: Diagnosis not present

## 2017-04-28 DIAGNOSIS — M25611 Stiffness of right shoulder, not elsewhere classified: Secondary | ICD-10-CM

## 2017-04-28 NOTE — Patient Instructions (Signed)
Strengthening: Chest Pull - Resisted   Hold Theraband in front of body with hands about shoulder width a part. Pull band a part and back together slowly. Repeat _10___ times. Complete __1__ set(s) per session.. Repeat _1___ session(s) per day.  http://orth.exer.us/926   Copyright  VHI. All rights reserved.   PNF Strengthening: Resisted   Standing with resistive band around each hand, bring right arm up and away, thumb back. Repeat __10__ times per set. Do ___1_ sets per session. Do __1__ sessions per day.                           Resisted External Rotation: in Neutral - Bilateral   Sit or stand, tubing in both hands, elbows at sides, bent to 90, forearms forward. Pinch shoulder blades together and rotate forearms out. Keep elbows at sides. Repeat _10___ times per set. Do ___1_ sets per session. Do __1__ sessions per day.  http://orth.exer.us/966   Copyright  VHI. All rights reserved.   PNF Strengthening: Resisted   Standing, hold resistive band above head. Bring right arm down and out from side. Repeat _10___ times per set. Do __1__ sets per session. Do __1__ sessions per day.  http://orth.exer.us/922   Copyright  VHI. All rights reserved.  

## 2017-04-28 NOTE — Therapy (Signed)
[place], [place], [address] Phone: [phone]   Fax:  [phone]  Occupational Therapy Reassessment, Treatment, Discharge Summary  Patient Details  Name: Alicia [name] MRN: [id number] Date of Birth: 1940/10/09 Referring Provider: Dr. Tania Ade  Encounter Date: 04/28/2017      OT End of Session - 04/28/17 1550    Visit Number 13   Number of Visits 16   Date for OT Re-Evaluation 05/03/17   Authorization Type Medicare A & B   Authorization Time Period before 19th visit   Authorization - Visit Number 13   Authorization - Number of Visits 19   OT Start Time 1300   OT Stop Time 1343   OT Time Calculation (min) 43 min   Activity Tolerance Patient tolerated treatment well   Behavior During Therapy [facility] for tasks assessed/performed      Past Medical History:  Diagnosis Date  . Anxiety   . GERD (gastroesophageal reflux disease)   . Hypercholesteremia   . Hypertension   . Insomnia   . Kidney stone   . Osteoarthritis   . Osteopenia   . Restless leg syndrome   . RLS (restless legs syndrome) 03/28/2016    Past Surgical History:  Procedure Laterality Date  . ABDOMINAL HYSTERECTOMY    . APPENDECTOMY    . CATARACT EXTRACTION Right 02/2010  . CHOLECYSTECTOMY      There were no vitals filed for this visit.      Subjective Assessment - 04/28/17 1302    Subjective  S: I'm doing everything I want to with this arm.    Currently in Pain? No/denies            [facility] OT Assessment - 04/28/17 1302      Assessment   Diagnosis right rotator cuff repair     Precautions   Precautions Shoulder   Type of Shoulder Precautions progress as tolerated     Palpation   Palpation comment trace fascial restrictions in right shoulder      AROM   Overall AROM Comments assessed in seated    AROM Assessment Site Shoulder   Right/Left Shoulder Right   Right Shoulder Flexion 165 Degrees  150 previous   Right  Shoulder ABduction 180 Degrees  130 previous   Right Shoulder Internal Rotation 90 Degrees  same as previous   Right Shoulder External Rotation 72 Degrees  70 previous     PROM   Overall PROM Comments Assessed supine, IR/er adducted   PROM Assessment Site Shoulder   Right/Left Shoulder Right   Right Shoulder Flexion 170 Degrees  same as previous   Right Shoulder ABduction 180 Degrees  170 previous   Right Shoulder Internal Rotation 90 Degrees  same as previous   Right Shoulder External Rotation 69 Degrees  40 previous     Strength   Overall Strength Comments Assessed seated, er/IR adducted   Strength Assessment Site Shoulder   Right/Left Shoulder Right   Right Shoulder Flexion 5/5  4/5 previous   Right Shoulder ABduction 5/5  4/5 previous   Right Shoulder Internal Rotation 5/5  4/5 previous   Right Shoulder External Rotation 5/5  4/5 previous                  OT Treatments/Exercises (OP) - 04/28/17 1302      Exercises   Exercises Shoulder     Shoulder Exercises: Supine   Protraction PROM;5 reps   Horizontal ABduction PROM;5  reps   External Rotation PROM;5 reps   Internal Rotation PROM;5 reps   Flexion PROM;5 reps   ABduction PROM;5 reps     Shoulder Exercises: Standing   Protraction Strengthening;10 reps   Protraction Weight (lbs) 2   Horizontal ABduction Strengthening;10 reps   Horizontal ABduction Weight (lbs) 2   External Rotation Strengthening;10 reps   External Rotation Weight (lbs) 2   Internal Rotation Strengthening;10 reps   Internal Rotation Weight (lbs) 2   Flexion Strengthening;10 reps   Shoulder Flexion Weight (lbs) 2   ABduction Strengthening;10 reps   Shoulder ABduction Weight (lbs) 2   Extension Theraband;10 reps   Theraband Level (Shoulder Extension) Level 3 (Green)   Row [employer] reps   Theraband Level (Shoulder Row) Level 3 (Green)   Retraction Theraband;10 reps   Theraband Level (Shoulder Retraction) Level 3 (Green)    Other Standing Exercises green theraband strengthening: horizontal abduction, er/IR, PNF patterns x2, 10X each   Other Standing Exercises Bowling ball simulation: pt used 10# weight to simulation rolling bowling ball, 5X, no difficulty     Shoulder Exercises: ROM/Strengthening   UBE (Upper Arm Bike) level 2, 3' forwards, 3' backwards   Cybex Press 2 plate;10 reps   Cybex Row 2 plate;10 reps     Manual Therapy   Manual Therapy Myofascial release   Manual therapy comments manual therapy completed seperately from all other interventions this date of service   Myofascial Release myofascial release and manual stretching  to right upper arm, scapular, trapezius and shoulder and right side cervical region to decrease pain and improve pain freem mobility in right shoulder region                 OT Education - 04/28/17 1557    Education provided Yes   Education Details green theraband strengthening, upgraded scapular theraband to green   Person(s) Educated Patient   Methods Explanation;Demonstration;Handout   Comprehension Verbalized understanding;Returned demonstration          OT Short Term Goals - 04/28/17 1319      OT SHORT TERM GOAL #1   Title Pt will be educated on HEP to improve RUE use as dominant during ADL and leisure tasks.    Time 4   Period Weeks   Status Achieved     OT SHORT TERM GOAL #2   Title Pt will decrease pain to 3/10 in RUE to improve ability to sleep at night.    Time 4   Period Weeks     OT SHORT TERM GOAL #3   Title Pt will decrease RUE fascial restrictions from moderate to minimal amounts to improve mobility required for functional reaching tasks.    Time 4   Period Weeks     OT SHORT TERM GOAL #4   Title Pt will increase RUE P/ROM to [facility] to improve ability to perform dressing tasks.    Time 4   Period Weeks     OT SHORT TERM GOAL #5   Title Pt will improve RUE strength to 3/5 to increase ability to reach for items on tabletop.    Time 4    Period Weeks           OT Long Term Goals - 04/28/17 1318      OT LONG TERM GOAL #1   Title Pt will return to highest level of independence and functioning using RUE as dominant during functional task completion.    Time 8   Period Weeks  Status Achieved     OT LONG TERM GOAL #2   Title Pt will decrease pain in RUE to 1/10 or less to improve ability to perform daily tasks using RUE as dominant.    Time 8   Period Weeks   Status Achieved     OT LONG TERM GOAL #3   Title Pt will decrease fascial restrictions in RUE to trace amounts to improve mobility required for fixing hair.    Time 8   Period Weeks   Status Achieved     OT LONG TERM GOAL #4   Title Pt will improve RUE A/ROM to WNL to increase ability to reach into overhead cabinets.    Time 8   Period Weeks   Status Achieved     OT LONG TERM GOAL #5   Title Pt will improve RUE strength to 4+/5 to increase ability to bowl using RUE as dominant.    Time 8   Period Weeks   Status Achieved               Plan - 2017-05-09 1551    Clinical Impression Statement A: Reassessment completed this session, pt has met all STGs and LTGs and is completing all B/IADL tasks independently. Pt reports she is using the RUE as dominant and is experiencing soreness occasionally at a minimal level. Pt is agreeable to discharge with HEP and continue strengthening at home.    Plan P: Discharge pt.    OT Home Exercise Plan 5/30: Table slides; 6/8: Pendulums; 7/10:  A/ROM   Consulted and Agree with Plan of Care Patient      Patient will benefit from skilled therapeutic intervention in order to improve the following deficits and impairments:  Decreased strength, Decreased activity tolerance, Decreased range of motion, Pain, Increased fascial restricitons, Impaired UE functional use  Visit Diagnosis: Acute pain of right shoulder  Stiffness of right shoulder, not elsewhere classified  Other symptoms and signs involving the  musculoskeletal system      G-Codes - 05/09/2017 1553    Functional Assessment Tool Used (Outpatient only) clinical judgement -    Functional Limitation Carrying, moving and handling objects   Carrying, Moving and Handling Objects Goal Status (X1062) At least 1 percent but less than 20 percent impaired, limited or restricted   Carrying, Moving and Handling Objects Discharge Status [id number]) At least 1 percent but less than 20 percent impaired, limited or restricted      Problem List Patient Active Problem List   Diagnosis Date Noted  . RLS (restless legs syndrome) 03/28/2016   Guadelupe Sabin, OTR/L  [phone] 05/09/2017, 3:58 PM  [place] [address] [place], [place], [address] Phone: [phone]   Fax:  [phone]  Name: Alicia [name] MRN: [id number]   [place] SUMMARY  Visits from Start of Care: 13  Current functional level related to goals / functional outcomes: See above.    Remaining deficits: Pt has occasional pain/soreness after exercises.    Education / Equipment: Provided updated HEP including green theraband strengthening and upgraded to green scapular theraband Plan: Patient agrees to discharge.  Patient goals were met. Patient is being discharged due to meeting the stated rehab goals.  ?????      Date of Birth: 1941/08/23

## 2017-05-06 DIAGNOSIS — Z9889 Other specified postprocedural states: Secondary | ICD-10-CM | POA: Diagnosis not present

## 2017-05-18 DIAGNOSIS — R0981 Nasal congestion: Secondary | ICD-10-CM | POA: Diagnosis not present

## 2017-06-04 DIAGNOSIS — M1990 Unspecified osteoarthritis, unspecified site: Secondary | ICD-10-CM | POA: Diagnosis not present

## 2017-06-04 DIAGNOSIS — I10 Essential (primary) hypertension: Secondary | ICD-10-CM | POA: Diagnosis not present

## 2017-06-24 DIAGNOSIS — I10 Essential (primary) hypertension: Secondary | ICD-10-CM | POA: Diagnosis not present

## 2017-06-24 DIAGNOSIS — M1990 Unspecified osteoarthritis, unspecified site: Secondary | ICD-10-CM | POA: Diagnosis not present

## 2017-07-01 DIAGNOSIS — Z09 Encounter for follow-up examination after completed treatment for conditions other than malignant neoplasm: Secondary | ICD-10-CM | POA: Diagnosis not present

## 2017-07-02 DIAGNOSIS — Z23 Encounter for immunization: Secondary | ICD-10-CM | POA: Diagnosis not present

## 2017-07-02 DIAGNOSIS — K219 Gastro-esophageal reflux disease without esophagitis: Secondary | ICD-10-CM | POA: Diagnosis not present

## 2017-07-02 DIAGNOSIS — G2581 Restless legs syndrome: Secondary | ICD-10-CM | POA: Diagnosis not present

## 2017-07-02 DIAGNOSIS — M899 Disorder of bone, unspecified: Secondary | ICD-10-CM | POA: Diagnosis not present

## 2017-07-02 DIAGNOSIS — E7800 Pure hypercholesterolemia, unspecified: Secondary | ICD-10-CM | POA: Diagnosis not present

## 2017-07-02 DIAGNOSIS — F411 Generalized anxiety disorder: Secondary | ICD-10-CM | POA: Diagnosis not present

## 2017-07-02 DIAGNOSIS — G479 Sleep disorder, unspecified: Secondary | ICD-10-CM | POA: Diagnosis not present

## 2017-07-02 DIAGNOSIS — Z0000 Encounter for general adult medical examination without abnormal findings: Secondary | ICD-10-CM | POA: Diagnosis not present

## 2017-07-02 DIAGNOSIS — I10 Essential (primary) hypertension: Secondary | ICD-10-CM | POA: Diagnosis not present

## 2017-07-02 DIAGNOSIS — J309 Allergic rhinitis, unspecified: Secondary | ICD-10-CM | POA: Diagnosis not present

## 2017-07-02 DIAGNOSIS — M1990 Unspecified osteoarthritis, unspecified site: Secondary | ICD-10-CM | POA: Diagnosis not present

## 2017-07-13 ENCOUNTER — Encounter: Payer: Self-pay | Admitting: Neurology

## 2017-07-13 ENCOUNTER — Ambulatory Visit (INDEPENDENT_AMBULATORY_CARE_PROVIDER_SITE_OTHER): Payer: Medicare Other | Admitting: Neurology

## 2017-07-13 VITALS — BP 130/70 | HR 62 | Ht 62.0 in | Wt 146.1 lb

## 2017-07-13 DIAGNOSIS — R001 Bradycardia, unspecified: Secondary | ICD-10-CM | POA: Diagnosis not present

## 2017-07-13 DIAGNOSIS — G2581 Restless legs syndrome: Secondary | ICD-10-CM

## 2017-07-13 MED ORDER — ROPINIROLE HCL 1 MG PO TABS: 1.0000 mg | ORAL_TABLET | Freq: Three times a day (TID) | ORAL | 3 refills | Status: DC

## 2017-07-13 NOTE — Progress Notes (Signed)
Follow-up Visit   Date: 07/13/17    Alicia [name] MRN: [id number] DOB: 12-11-1940   Interim History: Alicia [name] is a 76 y.o. right-handed Caucasian female with hypertension, GERD, hyperlipidemia, RLS, and insomia returning to the clinic for follow-up of RLS.  The patient was accompanied to the clinic by self.  History of present illness: She was diagnosed with RLS around early 2000s.  She has dull and deep ache involving the lower legs which is worse at rest such as driving or riding on plane for a long time and at nighttime.  The discomfort is improved with activity such as walking for 15-20 minutes.  She was started on ropinorole about 10 years ago and increased the dose about 4 years ago because symptoms started occuring sooner in the evening. Over the past year, she started having worsening discomfort and recently she began taking 1mg  TID without any added benefit, and in fact, feels that the discomfort is worsening. No numbness/tingling, weakness, or imbalance.   UPDATE 07/21/2016:  At her last visit, I attempted to reduce her dopamine agonist because of augmentation and transitioned her to gabapentin; however, she did not see any benefit with gabapentin, so discontinued this and restarted ropinorole.   She takes ropinorole 1mg  twice daily which seems to help her.  She has breakthrough pain in the afternoon around 1-2pm.  Her morning and nighttime symptoms are well-controlled.  No new complaints.   UPDATE 07/13/2017:  She is here for 6 month appointment.  She continues to be on requip 1mg  TID and has adequate control of RLS symptoms. She denies any breakthrough of discomfort and is overall doing well.   Medications:  Current Outpatient Prescriptions on File Prior to Visit  Medication Sig Dispense Refill  . amLODipine (NORVASC) 5 MG tablet Take 2.5 mg by mouth daily.    . calcium-vitamin D (OSCAL WITH D) 500-200 MG-UNIT per tablet Take 1 tablet by mouth daily.    Marland Kitchen  glucosamine-chondroitin 500-400 MG tablet Take 1 tablet by mouth 3 (three) times daily.    Marland Kitchen LORazepam (ATIVAN) 0.5 MG tablet     . losartan (COZAAR) 25 MG tablet Take 25 mg by mouth daily.    . meloxicam (MOBIC) 15 MG tablet Take 15 mg by mouth daily.    . multivitamin-lutein (OCUVITE-LUTEIN) CAPS Take 1 capsule by mouth daily.    . OMEGA 3 1000 MG CAPS Take 1 capsule by mouth daily.    Marland Kitchen omeprazole (PRILOSEC) 40 MG capsule     . rOPINIRole (REQUIP) 1 MG tablet Take 1 tablet (1 mg total) by mouth 3 (three) times daily. 270 tablet 3  . simvastatin (ZOCOR) 20 MG tablet      No current facility-administered medications on file prior to visit.     Allergies:  Allergies  Allergen Reactions  . Ibuprofen     GI UPSET  . Lexapro [Escitalopram Oxalate]     GERD  . Lisinopril Cough  . Mobic [Meloxicam]     LEG PAIN  . Nabumetone     GI UPSET  . Prilosec Otc [Omeprazole Magnesium]     ARM SPASM  . Zoloft [Sertraline Hcl]     INSOMNIA  . Sulfa Antibiotics Rash    Review of Systems:  CONSTITUTIONAL: No fevers, chills, night sweats, or weight loss.  EYES: No visual changes or eye pain ENT: No hearing changes.  No history of nose bleeds.   RESPIRATORY: No cough, wheezing and shortness of breath.  CARDIOVASCULAR: Negative for chest pain, and palpitations.   GI: Negative for abdominal discomfort, blood in stools or black stools.  No recent change in bowel habits.   GU:  No history of incontinence.   MUSCLOSKELETAL: No history of joint pain or swelling.  No myalgias.   SKIN: Negative for lesions, rash, and itching.   ENDOCRINE: Negative for cold or heat intolerance, polydipsia or goiter.   PSYCH:  No depression or anxiety symptoms.   NEURO: As Above.   Vital Signs:  BP 130/70   Pulse 62   Ht 5\' 2"  (1.575 m)   Wt 146 lb 1 oz (66.3 kg)   SpO2 96%   BMI 26.72 kg/m   Neurological Exam: MENTAL STATUS including orientation to time, place, person, recent and remote memory, attention  span and concentration, language, and fund of knowledge is normal.  Speech is not dysarthric.  CRANIAL NERVES:   Pupils equal round and reactive to light.  Normal conjugate, extra-ocular eye movements in all directions of gaze.  Face is symmetric.  MOTOR:  Motor strength is 5/5 in all extremities  COORDINATION/GAIT:    Gait narrow based and stable.   Data: Lab Results  Component Value Date   FERRITIN 83.7 03/28/2016    IMPRESSION/PLAN: Restless leg syndrome, well-controlled on ropinorole 1mg  (7am, 1pm, and 9pm).  Refills provided for 1 year.   Bradycardia, asymptomatic. HR 47 initially, I rechecked it personally and it was 62.  She denies any shortness of breath, chest discomfort, and dizziness.  Recommend follow-up with PCP.   Return to clinic in 4 months  The duration of this appointment visit was 15 minutes of face-to-face time with the patient.  Greater than 50% of this time was spent in counseling, explanation of diagnosis, planning of further management, and coordination of care.   Thank you for allowing me to participate in patient's care.  If I can answer any additional questions, I would be pleased to do so.    Sincerely,    Elliett Guarisco K. Posey Pronto, DO

## 2017-07-13 NOTE — Patient Instructions (Signed)
Continue requip 1mg  three times daily (7am, 1pm, and 9pm)  Return to clinic in 1 year

## 2017-08-21 DIAGNOSIS — S0501XA Injury of conjunctiva and corneal abrasion without foreign body, right eye, initial encounter: Secondary | ICD-10-CM | POA: Diagnosis not present

## 2017-09-18 DIAGNOSIS — Z1231 Encounter for screening mammogram for malignant neoplasm of breast: Secondary | ICD-10-CM | POA: Diagnosis not present

## 2017-10-06 HISTORY — PX: BLEPHAROPLASTY: SUR158

## 2017-10-07 DIAGNOSIS — H6691 Otitis media, unspecified, right ear: Secondary | ICD-10-CM | POA: Diagnosis not present

## 2017-10-07 DIAGNOSIS — J069 Acute upper respiratory infection, unspecified: Secondary | ICD-10-CM | POA: Diagnosis not present

## 2017-11-19 ENCOUNTER — Other Ambulatory Visit: Payer: Self-pay | Admitting: Family Medicine

## 2017-11-19 ENCOUNTER — Ambulatory Visit
Admission: RE | Admit: 2017-11-19 | Discharge: 2017-11-19 | Disposition: A | Discharge status: Home / Self Care | Payer: Medicare Other | Source: Ambulatory Visit | Attending: Family Medicine | Admitting: Family Medicine

## 2017-11-19 DIAGNOSIS — R05 Cough: Secondary | ICD-10-CM

## 2017-11-19 DIAGNOSIS — J069 Acute upper respiratory infection, unspecified: Secondary | ICD-10-CM | POA: Diagnosis not present

## 2017-11-19 DIAGNOSIS — R32 Unspecified urinary incontinence: Secondary | ICD-10-CM | POA: Diagnosis not present

## 2017-11-19 DIAGNOSIS — R059 Cough, unspecified: Secondary | ICD-10-CM

## 2017-12-30 DIAGNOSIS — J309 Allergic rhinitis, unspecified: Secondary | ICD-10-CM | POA: Diagnosis not present

## 2017-12-30 DIAGNOSIS — M1990 Unspecified osteoarthritis, unspecified site: Secondary | ICD-10-CM | POA: Diagnosis not present

## 2017-12-30 DIAGNOSIS — K219 Gastro-esophageal reflux disease without esophagitis: Secondary | ICD-10-CM | POA: Diagnosis not present

## 2017-12-30 DIAGNOSIS — M67911 Unspecified disorder of synovium and tendon, right shoulder: Secondary | ICD-10-CM | POA: Diagnosis not present

## 2017-12-30 DIAGNOSIS — F411 Generalized anxiety disorder: Secondary | ICD-10-CM | POA: Diagnosis not present

## 2017-12-30 DIAGNOSIS — M899 Disorder of bone, unspecified: Secondary | ICD-10-CM | POA: Diagnosis not present

## 2017-12-30 DIAGNOSIS — G479 Sleep disorder, unspecified: Secondary | ICD-10-CM | POA: Diagnosis not present

## 2017-12-30 DIAGNOSIS — E7800 Pure hypercholesterolemia, unspecified: Secondary | ICD-10-CM | POA: Diagnosis not present

## 2017-12-30 DIAGNOSIS — G2581 Restless legs syndrome: Secondary | ICD-10-CM | POA: Diagnosis not present

## 2017-12-30 DIAGNOSIS — I10 Essential (primary) hypertension: Secondary | ICD-10-CM | POA: Diagnosis not present

## 2018-02-09 DIAGNOSIS — M8588 Other specified disorders of bone density and structure, other site: Secondary | ICD-10-CM | POA: Diagnosis not present

## 2018-02-18 DIAGNOSIS — I10 Essential (primary) hypertension: Secondary | ICD-10-CM | POA: Diagnosis not present

## 2018-02-18 DIAGNOSIS — M1990 Unspecified osteoarthritis, unspecified site: Secondary | ICD-10-CM | POA: Diagnosis not present

## 2018-05-11 DIAGNOSIS — R5383 Other fatigue: Secondary | ICD-10-CM | POA: Diagnosis not present

## 2018-05-11 DIAGNOSIS — R42 Dizziness and giddiness: Secondary | ICD-10-CM | POA: Diagnosis not present

## 2018-05-13 DIAGNOSIS — H35033 Hypertensive retinopathy, bilateral: Secondary | ICD-10-CM | POA: Diagnosis not present

## 2018-05-13 DIAGNOSIS — I10 Essential (primary) hypertension: Secondary | ICD-10-CM | POA: Diagnosis not present

## 2018-05-13 DIAGNOSIS — H5201 Hypermetropia, right eye: Secondary | ICD-10-CM | POA: Diagnosis not present

## 2018-05-13 DIAGNOSIS — H52223 Regular astigmatism, bilateral: Secondary | ICD-10-CM | POA: Diagnosis not present

## 2018-05-13 DIAGNOSIS — H26493 Other secondary cataract, bilateral: Secondary | ICD-10-CM | POA: Diagnosis not present

## 2018-05-13 DIAGNOSIS — Z961 Presence of intraocular lens: Secondary | ICD-10-CM | POA: Diagnosis not present

## 2018-05-13 DIAGNOSIS — H43393 Other vitreous opacities, bilateral: Secondary | ICD-10-CM | POA: Diagnosis not present

## 2018-05-26 DIAGNOSIS — M67912 Unspecified disorder of synovium and tendon, left shoulder: Secondary | ICD-10-CM | POA: Diagnosis not present

## 2018-07-06 DIAGNOSIS — Z0000 Encounter for general adult medical examination without abnormal findings: Secondary | ICD-10-CM | POA: Diagnosis not present

## 2018-07-06 DIAGNOSIS — I10 Essential (primary) hypertension: Secondary | ICD-10-CM | POA: Diagnosis not present

## 2018-07-06 DIAGNOSIS — E7800 Pure hypercholesterolemia, unspecified: Secondary | ICD-10-CM | POA: Diagnosis not present

## 2018-07-06 DIAGNOSIS — F411 Generalized anxiety disorder: Secondary | ICD-10-CM | POA: Diagnosis not present

## 2018-07-06 DIAGNOSIS — J309 Allergic rhinitis, unspecified: Secondary | ICD-10-CM | POA: Diagnosis not present

## 2018-07-06 DIAGNOSIS — M899 Disorder of bone, unspecified: Secondary | ICD-10-CM | POA: Diagnosis not present

## 2018-07-06 DIAGNOSIS — K219 Gastro-esophageal reflux disease without esophagitis: Secondary | ICD-10-CM | POA: Diagnosis not present

## 2018-07-06 DIAGNOSIS — G479 Sleep disorder, unspecified: Secondary | ICD-10-CM | POA: Diagnosis not present

## 2018-07-06 DIAGNOSIS — M1990 Unspecified osteoarthritis, unspecified site: Secondary | ICD-10-CM | POA: Diagnosis not present

## 2018-07-06 DIAGNOSIS — Z23 Encounter for immunization: Secondary | ICD-10-CM | POA: Diagnosis not present

## 2018-07-06 DIAGNOSIS — Z1389 Encounter for screening for other disorder: Secondary | ICD-10-CM | POA: Diagnosis not present

## 2018-07-06 DIAGNOSIS — G2581 Restless legs syndrome: Secondary | ICD-10-CM | POA: Diagnosis not present

## 2018-07-16 ENCOUNTER — Ambulatory Visit: Payer: Medicare Other | Admitting: Neurology

## 2018-07-27 DIAGNOSIS — R1310 Dysphagia, unspecified: Secondary | ICD-10-CM | POA: Diagnosis not present

## 2018-07-27 DIAGNOSIS — R1013 Epigastric pain: Secondary | ICD-10-CM | POA: Diagnosis not present

## 2018-07-27 DIAGNOSIS — K219 Gastro-esophageal reflux disease without esophagitis: Secondary | ICD-10-CM | POA: Diagnosis not present

## 2018-08-06 DIAGNOSIS — R1013 Epigastric pain: Secondary | ICD-10-CM | POA: Diagnosis not present

## 2018-08-06 DIAGNOSIS — K317 Polyp of stomach and duodenum: Secondary | ICD-10-CM | POA: Diagnosis not present

## 2018-08-06 DIAGNOSIS — R1310 Dysphagia, unspecified: Secondary | ICD-10-CM | POA: Diagnosis not present

## 2018-08-06 DIAGNOSIS — K219 Gastro-esophageal reflux disease without esophagitis: Secondary | ICD-10-CM | POA: Diagnosis not present

## 2018-08-19 DIAGNOSIS — K5730 Diverticulosis of large intestine without perforation or abscess without bleeding: Secondary | ICD-10-CM | POA: Diagnosis not present

## 2018-08-19 DIAGNOSIS — K219 Gastro-esophageal reflux disease without esophagitis: Secondary | ICD-10-CM | POA: Diagnosis not present

## 2018-08-19 DIAGNOSIS — M1990 Unspecified osteoarthritis, unspecified site: Secondary | ICD-10-CM | POA: Diagnosis not present

## 2018-10-05 DIAGNOSIS — Z1231 Encounter for screening mammogram for malignant neoplasm of breast: Secondary | ICD-10-CM | POA: Diagnosis not present

## 2018-10-08 ENCOUNTER — Encounter (HOSPITAL_COMMUNITY): Payer: Self-pay | Admitting: Emergency Medicine

## 2018-10-08 ENCOUNTER — Emergency Department (HOSPITAL_COMMUNITY): Payer: Medicare Other

## 2018-10-08 ENCOUNTER — Emergency Department (HOSPITAL_COMMUNITY)
Admission: EM | Admit: 2018-10-08 | Discharge: 2018-10-09 | Disposition: A | Discharge status: Home / Self Care | Payer: Medicare Other | Attending: Emergency Medicine | Admitting: Emergency Medicine

## 2018-10-08 ENCOUNTER — Other Ambulatory Visit: Payer: Self-pay

## 2018-10-08 DIAGNOSIS — K5730 Diverticulosis of large intestine without perforation or abscess without bleeding: Secondary | ICD-10-CM | POA: Diagnosis not present

## 2018-10-08 DIAGNOSIS — K5900 Constipation, unspecified: Secondary | ICD-10-CM | POA: Diagnosis not present

## 2018-10-08 DIAGNOSIS — R1084 Generalized abdominal pain: Secondary | ICD-10-CM | POA: Insufficient documentation

## 2018-10-08 DIAGNOSIS — Z87891 Personal history of nicotine dependence: Secondary | ICD-10-CM | POA: Insufficient documentation

## 2018-10-08 DIAGNOSIS — R109 Unspecified abdominal pain: Secondary | ICD-10-CM | POA: Diagnosis present

## 2018-10-08 DIAGNOSIS — I10 Essential (primary) hypertension: Secondary | ICD-10-CM | POA: Diagnosis not present

## 2018-10-08 LAB — COMPREHENSIVE METABOLIC PANEL
ALT: 15 U/L (ref 0–44)
AST: 20 U/L (ref 15–41)
Albumin: 3.8 g/dL (ref 3.5–5.0)
Alkaline Phosphatase: 76 U/L (ref 38–126)
Anion gap: 8 (ref 5–15)
BUN: 22 mg/dL (ref 8–23)
CO2: 24 mmol/L (ref 22–32)
Calcium: 9.1 mg/dL (ref 8.9–10.3)
Chloride: 103 mmol/L (ref 98–111)
Creatinine, Ser: 0.72 mg/dL (ref 0.44–1.00)
GFR calc Af Amer: >60 mL/min (ref >60)
GFR calc non Af Amer: >60 mL/min (ref >60)
Glucose, Bld: 114 mg/dL — ABNORMAL HIGH (ref 70–99)
Potassium: 3.4 mmol/L — ABNORMAL LOW (ref 3.5–5.1)
Sodium: 135 mmol/L (ref 135–145)
Total Bilirubin: 0.6 mg/dL (ref 0.3–1.2)
Total Protein: 7.3 g/dL (ref 6.5–8.1)

## 2018-10-08 LAB — URINALYSIS, ROUTINE W REFLEX MICROSCOPIC
Bacteria, UA: NONE SEEN
Bilirubin Urine: NEGATIVE
Glucose, UA: NEGATIVE mg/dL
Ketones, ur: NEGATIVE mg/dL
Leukocytes, UA: NEGATIVE
Nitrite: NEGATIVE
Protein, ur: NEGATIVE mg/dL
Specific Gravity, Urine: 1.01 (ref 1.005–1.030)
pH: 7 (ref 5.0–8.0)

## 2018-10-08 LAB — CBC WITH DIFFERENTIAL/PLATELET
Abs Immature Granulocytes: 0.02 10*3/uL (ref 0.00–0.07)
Basophils Absolute: 0 10*3/uL (ref 0.0–0.1)
Basophils Relative: 0 %
Eosinophils Absolute: 0.3 10*3/uL (ref 0.0–0.5)
Eosinophils Relative: 3 %
HCT: 42.3 % (ref 36.0–46.0)
Hemoglobin: 13.9 g/dL (ref 12.0–15.0)
Immature Granulocytes: 0 %
Lymphocytes Relative: 32 %
Lymphs Abs: 3.1 10*3/uL (ref 0.7–4.0)
MCH: 29.6 pg (ref 26.0–34.0)
MCHC: 32.9 g/dL (ref 30.0–36.0)
MCV: 90.2 fL (ref 80.0–100.0)
Monocytes Absolute: 1 10*3/uL (ref 0.1–1.0)
Monocytes Relative: 11 %
Neutro Abs: 5.1 10*3/uL (ref 1.7–7.7)
Neutrophils Relative %: 54 %
Platelets: 276 10*3/uL (ref 150–400)
RBC: 4.69 MIL/uL (ref 3.87–5.11)
RDW: 11.9 % (ref 11.5–15.5)
WBC: 9.6 10*3/uL (ref 4.0–10.5)
nRBC: 0 % (ref 0.0–0.2)

## 2018-10-08 LAB — LIPASE, BLOOD: Lipase: 29 U/L (ref 11–51)

## 2018-10-08 LAB — I-STAT CG4 LACTIC ACID, ED: Lactic Acid, Venous: 0.66 mmol/L (ref 0.5–1.9)

## 2018-10-08 MED ORDER — ONDANSETRON HCL 4 MG/2ML IJ SOLN
4.0000 mg | Freq: Once | INTRAMUSCULAR | Status: AC
  Administered 2018-10-08: 4 mg via INTRAVENOUS
  Filled 2018-10-08: qty 2

## 2018-10-08 MED ORDER — IOPAMIDOL (ISOVUE-300) INJECTION 61%
100.0000 mL | Freq: Once | INTRAVENOUS | Status: AC | PRN
  Administered 2018-10-08: 100 mL via INTRAVENOUS

## 2018-10-08 MED ORDER — MORPHINE SULFATE (PF) 4 MG/ML IV SOLN
4.0000 mg | Freq: Once | INTRAVENOUS | Status: AC
  Administered 2018-10-08: 4 mg via INTRAVENOUS
  Filled 2018-10-08: qty 1

## 2018-10-08 MED ORDER — SODIUM CHLORIDE 0.9 % IV BOLUS
500.0000 mL | Freq: Once | INTRAVENOUS | Status: AC
  Administered 2018-10-08: 500 mL via INTRAVENOUS

## 2018-10-08 NOTE — ED Provider Notes (Signed)
Emergency Department Provider Note   I have reviewed the triage vital signs and the nursing notes.   HISTORY  Chief Complaint Flank Pain   HPI Alicia [name] is a 78 y.o. female with PMH of GERD, HTN, HLD, OA, and RLS presents to the emergency department for evaluation of pain in the lower back with some radiation to the abdomen with associated constipation.  Patient states she has had symptoms worsening over the last week.  She has not had a bowel movement in the past 4 days and does not recall passing flatus.  She is having belching without nausea or vomiting. Pain in the back is worse with movement and started after lifting. No chest pain or shortness of breath.  No fevers or chills.  She states that initially her symptoms began with left flank and back pain that has now progressed to include the entire lower back as well as her abdomen.  No pain radiating down the legs.  No numbness or weakness.  She does have some urinary frequency and urgency but no dysuria.  She has had kidney stones in the past but states this does not feel similar to that nor does this feels similar to prior UTIs.    Past Medical History:  Diagnosis Date  . Anxiety   . GERD (gastroesophageal reflux disease)   . Hypercholesteremia   . Hypertension   . Insomnia   . Kidney stone   . Osteoarthritis   . Osteopenia   . Restless leg syndrome   . RLS (restless legs syndrome) 03/28/2016    Patient Active Problem List   Diagnosis Date Noted  . RLS (restless legs syndrome) 03/28/2016    Past Surgical History:  Procedure Laterality Date  . ABDOMINAL HYSTERECTOMY    . APPENDECTOMY    . CATARACT EXTRACTION Right 02/2010  . CHOLECYSTECTOMY     Allergies Ibuprofen; Lexapro [escitalopram oxalate]; Lisinopril; Mobic [meloxicam]; Nabumetone; Prilosec otc [omeprazole magnesium]; Zoloft [sertraline hcl]; and Sulfa antibiotics  Family History  Problem Relation Age of Onset  . Heart attack Mother   . Heart attack  Father   . COPD Sister   . COPD Brother   . COPD Brother   . COPD Sister     Social History Social History   Tobacco Use  . Smoking status: Former Smoker    Last attempt to quit: 10/06/1996    Years since quitting: 22.0  . Smokeless tobacco: Never Used  Substance Use Topics  . Alcohol use: No    Alcohol/week: 0.0 standard drinks  . Drug use: No    Review of Systems  Constitutional: No fever/chills Eyes: No visual changes. ENT: No sore throat. Cardiovascular: Denies chest pain. Respiratory: Denies shortness of breath. Gastrointestinal: Positive abdominal pain.  No nausea, no vomiting.  No diarrhea. Positive constipation. Genitourinary: Negative for dysuria. Positive hesitancy and urgency.  Musculoskeletal: Positive for back pain. Skin: Negative for rash. Neurological: Negative for headaches, focal weakness or numbness.  10-point ROS otherwise negative.  ____________________________________________   PHYSICAL EXAM:  VITAL SIGNS: ED Triage Vitals  Enc Vitals Group     BP 10/08/18 2154 (!) 200/90     Pulse Rate 10/08/18 2154 83     Resp 10/08/18 2154 20     Temp 10/08/18 2154 98.2 F (36.8 C)     Temp Source 10/08/18 2154 Oral     SpO2 10/08/18 2154 96 %     Weight 10/08/18 2153 146 lb (66.2 kg)  Height 10/08/18 2153 5\' 2"  (1.575 m)     Pain Score 10/08/18 2152 10   Constitutional: Alert and oriented. Patient provides a full history but appears uncomfortable.  Eyes: Conjunctivae are normal. Head: Atraumatic. Nose: No congestion/rhinnorhea. Mouth/Throat: Mucous membranes are moist.  Neck: No stridor.  Cardiovascular: Normal rate, regular rhythm. Good peripheral circulation. Grossly normal heart sounds.   Respiratory: Normal respiratory effort.  No retractions. Lungs CTAB. Gastrointestinal: Soft with diffuse mild/moderate tenderness worse in the lower abdomen. No rebound or guarding. Positive mild distention.  Musculoskeletal: No lower extremity tenderness  nor edema. No gross deformities of extremities. Neurologic:  Normal speech and language. No gross focal neurologic deficits are appreciated.  Skin:  Skin is warm, dry and intact. No rash noted.  ____________________________________________   LABS (all labs ordered are listed, but only abnormal results are displayed)  Labs Reviewed  COMPREHENSIVE METABOLIC PANEL - Abnormal; Notable for the following components:      Result Value   Potassium 3.4 (*)    Glucose, Bld 114 (*)    All other components within normal limits  URINALYSIS, ROUTINE W REFLEX MICROSCOPIC - Abnormal; Notable for the following components:   APPearance HAZY (*)    Hgb urine dipstick MODERATE (*)    All other components within normal limits  URINE CULTURE  LIPASE, BLOOD  CBC WITH DIFFERENTIAL/PLATELET  I-STAT CG4 LACTIC ACID, ED   ____________________________________________  RADIOLOGY  Ct Abdomen Pelvis W Contrast  Result Date: 10/08/2018 CLINICAL DATA:  Bowel obstruction, high-grade Abd pain, acute, generalized. Patient reports right flank/abdominal pain for 2 weeks, progressive. EXAM: CT ABDOMEN AND PELVIS WITH CONTRAST TECHNIQUE: Multidetector CT imaging of the abdomen and pelvis was performed using the standard protocol following bolus administration of intravenous contrast. CONTRAST:  138mL ISOVUE-300 IOPAMIDOL (ISOVUE-300) INJECTION 61% COMPARISON:  CT 03/10/2013 FINDINGS: Lower chest: No acute finding. No consolidation or pleural fluid. Few tiny subpleural nodules in the right lower lobe are unchanged from 2014 and considered benign. Hepatobiliary: No focal hepatic abnormality. Postcholecystectomy. Mild intra and extrahepatic biliary ductal dilatation, common bile duct is 11 mm dimension. Pancreas: No ductal dilatation or inflammation. Spleen: Normal in size with calcified granuloma. Adrenals/Urinary Tract: Normal adrenal glands. No hydronephrosis or perinephric edema. Mild prominence of bilateral renal pelvises.  Homogeneous renal enhancement with symmetric excretion on delayed phase imaging. Tiny cortical hypodensity in the right kidney is too small to characterize. Urinary bladder is physiologically distended without wall thickening. No visualized urolithiasis. Stomach/Bowel: Ingested material/fluid in the stomach without evidence of gastric wall thickening. No small bowel dilatation, obstruction, or inflammatory change. The appendix is not visualized, surgically absent per history. Colonic diverticulosis most prominent in the sigmoid colon without acute diverticulitis. Small to moderate colonic stool burden without colonic wall thickening. Vascular/Lymphatic: Ill-defined haziness and stranding adjacent to the origin and proximal SMA. Mild haziness about the proximal celiac artery to a lesser extent. No evidence of arterial occlusion. Portal and mesenteric veins are patent. Aortic atherosclerosis without aneurysm. No periaortic stranding. Circumaortic left renal vein. Small central mesenteric nodes on the left, not enlarged by size criteria. Reproductive: Status post hysterectomy. No adnexal masses. Other: No ascites or free air. Musculoskeletal: There are no acute or suspicious osseous abnormalities. Vertebral body hemangioma within L1. IMPRESSION: 1. Ill-defined haziness and stranding adjacent to the origin and proximal SMA and to lesser extent proximal celiac artery. Findings most suggestive of vasculitis. No associated bowel inflammation, obstruction, or ischemia. 2. Colonic diverticulosis without acute inflammation. 3. Postcholecystectomy with mild intra  and extrahepatic biliary ductal dilatation. This is most likely a post reservoir effect in the setting of normal LFTs, and no dedicated further imaging is needed. 4.  Aortic Atherosclerosis (ICD10-I70.0). Electronically Signed   By: Keith Rake M.D.   On: 10/08/2018 23:39    ____________________________________________   PROCEDURES  Procedure(s)  performed:   Procedures  None ____________________________________________   INITIAL IMPRESSION / ASSESSMENT AND PLAN / ED COURSE  Pertinent labs & imaging results that were available during my care of the patient were reviewed by me and considered in my medical decision making (see chart for details).  Patient presents to the emergency department for evaluation of back and abdominal pain.  She has had some UTI symptoms but no dysuria or fever.  She has diffuse tenderness on abdominal exam which I plan to evaluate with CT abdomen pelvis with contrast.  Plan to also follow UA along with labs.  Will control pain with morphine and Zofran at this time.  Extremely low suspicion for vascular pathology.  No evidence on exam to suspect spinal cord involvement or need for neuroimaging. Given no passage of flatus would consider PO contrast as well to better evaluate for SBO.   11:45 PM CT results with inflammation around the SMA. No occlusion or other signs to suggest bowel ischemia. Location does not correlate with the patient's symptoms which are primarily back and lower abdomen. Plan for lactate here and if negative will treat constipation and refer to PCP for further f/u. Patient is feeling much better on re-examination.Pain has resolved after IVF and medication here. BP decreased with pain control. Labs are unremarkable. No UTI.   Lactate normal. Plan for discharge with ED return precautions and PCP follow up as above.  ____________________________________________  FINAL CLINICAL IMPRESSION(S) / ED DIAGNOSES  Final diagnoses:  Generalized abdominal pain  Constipation, unspecified constipation type     MEDICATIONS GIVEN DURING THIS VISIT:  Medications  sodium chloride 0.9 % bolus 500 mL (0 mLs Intravenous Stopped 10/08/18 2353)  morphine 4 MG/ML injection 4 mg (4 mg Intravenous Given 10/08/18 2225)  ondansetron (ZOFRAN) injection 4 mg (4 mg Intravenous Given 10/08/18 2225)  iopamidol  (ISOVUE-300) 61 % injection 100 mL (100 mLs Intravenous Contrast Given 10/08/18 2304)     NEW OUTPATIENT MEDICATIONS STARTED DURING THIS VISIT:  Discharge Medication List as of 10/09/2018 12:01 AM    START taking these medications   Details  polyethylene glycol (MIRALAX) packet Take 17 g by mouth daily., Starting Fri 10/08/2018, Print    senna-docusate (SENOKOT-S) 8.6-50 MG tablet Take 1 tablet by mouth at bedtime as needed for up to 7 days for mild constipation or moderate constipation., Starting Fri 10/08/2018, Until Fri 10/15/2018, Print        Note:  This document was prepared using Dragon voice recognition software and may include unintentional dictation errors.  Nanda Quinton, MD Emergency Medicine    Kace Hartje, [facility], MD 10/09/18 [phone]

## 2018-10-08 NOTE — ED Triage Notes (Signed)
Right flank pain for intermittent for two weeks. Now having progressive pain and now going into abd. Pressure when peeing.

## 2018-10-08 NOTE — ED Notes (Signed)
Patient transported to X-ray 

## 2018-10-09 MED ORDER — SENNOSIDES-DOCUSATE SODIUM 8.6-50 MG PO TABS: 1.0000 | ORAL_TABLET | Freq: Every evening | ORAL | 0 refills | Status: AC | PRN

## 2018-10-09 MED ORDER — POLYETHYLENE GLYCOL 3350 17 G PO PACK: 17.0000 g | PACK | Freq: Every day | ORAL | 0 refills | Status: DC

## 2018-10-09 NOTE — Discharge Instructions (Signed)
You have been seen in the Emergency Department (ED) for abdominal pain.  Your evaluation did not identify a clear cause of your symptoms but was generally reassuring.  You do have some inflammation around an artery that supplies blood to the upper part of your abdomen. You will need to call your PCP on Monday to discuss this finding and have additional testing as needed. Return to the Emergency Department if you develop pain in your middle back or the top part of your abdomen.   Please follow up as instructed above regarding todays emergent visit and the symptoms that are bothering you.  Return to the ED if your abdominal pain worsens or fails to improve, you develop bloody vomiting, bloody diarrhea, you are unable to tolerate fluids due to vomiting, fever greater than 101, or other symptoms that concern you.

## 2018-10-10 LAB — URINE CULTURE
Culture: <10000 — AB
Special Requests: NORMAL

## 2018-10-12 ENCOUNTER — Ambulatory Visit (INDEPENDENT_AMBULATORY_CARE_PROVIDER_SITE_OTHER): Payer: Medicare Other | Admitting: Rheumatology

## 2018-10-12 ENCOUNTER — Encounter: Payer: Self-pay | Admitting: Rheumatology

## 2018-10-12 ENCOUNTER — Ambulatory Visit (INDEPENDENT_AMBULATORY_CARE_PROVIDER_SITE_OTHER): Payer: Medicare Other

## 2018-10-12 ENCOUNTER — Other Ambulatory Visit: Payer: Self-pay | Admitting: Gastroenterology

## 2018-10-12 VITALS — BP 151/75 | HR 68 | Resp 13 | Ht 62.0 in | Wt 148.0 lb

## 2018-10-12 DIAGNOSIS — Z87442 Personal history of urinary calculi: Secondary | ICD-10-CM | POA: Diagnosis not present

## 2018-10-12 DIAGNOSIS — R1030 Lower abdominal pain, unspecified: Secondary | ICD-10-CM | POA: Diagnosis not present

## 2018-10-12 DIAGNOSIS — K219 Gastro-esophageal reflux disease without esophagitis: Secondary | ICD-10-CM | POA: Diagnosis not present

## 2018-10-12 DIAGNOSIS — M545 Low back pain, unspecified: Secondary | ICD-10-CM

## 2018-10-12 DIAGNOSIS — M25552 Pain in left hip: Secondary | ICD-10-CM

## 2018-10-12 DIAGNOSIS — G8929 Other chronic pain: Secondary | ICD-10-CM

## 2018-10-12 DIAGNOSIS — I10 Essential (primary) hypertension: Secondary | ICD-10-CM

## 2018-10-12 DIAGNOSIS — M7061 Trochanteric bursitis, right hip: Secondary | ICD-10-CM

## 2018-10-12 DIAGNOSIS — M8589 Other specified disorders of bone density and structure, multiple sites: Secondary | ICD-10-CM

## 2018-10-12 DIAGNOSIS — K5730 Diverticulosis of large intestine without perforation or abscess without bleeding: Secondary | ICD-10-CM | POA: Diagnosis not present

## 2018-10-12 DIAGNOSIS — M19041 Primary osteoarthritis, right hand: Secondary | ICD-10-CM | POA: Diagnosis not present

## 2018-10-12 DIAGNOSIS — Z8719 Personal history of other diseases of the digestive system: Secondary | ICD-10-CM | POA: Diagnosis not present

## 2018-10-12 DIAGNOSIS — M25511 Pain in right shoulder: Secondary | ICD-10-CM | POA: Diagnosis not present

## 2018-10-12 DIAGNOSIS — Z8659 Personal history of other mental and behavioral disorders: Secondary | ICD-10-CM

## 2018-10-12 DIAGNOSIS — K5790 Diverticulosis of intestine, part unspecified, without perforation or abscess without bleeding: Secondary | ICD-10-CM

## 2018-10-12 DIAGNOSIS — R109 Unspecified abdominal pain: Secondary | ICD-10-CM | POA: Diagnosis not present

## 2018-10-12 DIAGNOSIS — M19042 Primary osteoarthritis, left hand: Secondary | ICD-10-CM

## 2018-10-12 DIAGNOSIS — K5909 Other constipation: Secondary | ICD-10-CM

## 2018-10-12 DIAGNOSIS — E785 Hyperlipidemia, unspecified: Secondary | ICD-10-CM

## 2018-10-12 DIAGNOSIS — R194 Change in bowel habit: Secondary | ICD-10-CM | POA: Diagnosis not present

## 2018-10-12 DIAGNOSIS — G2581 Restless legs syndrome: Secondary | ICD-10-CM

## 2018-10-12 DIAGNOSIS — M7062 Trochanteric bursitis, left hip: Secondary | ICD-10-CM

## 2018-10-12 DIAGNOSIS — K5904 Chronic idiopathic constipation: Secondary | ICD-10-CM | POA: Diagnosis not present

## 2018-10-12 NOTE — Progress Notes (Signed)
Office Visit Note  Patient: Alicia [name]             Date of Birth: [birth year]           MRN: [id number]             PCP: Gaynelle Arabian, MD Referring: Gaynelle Arabian, MD Visit Date: 10/12/2018 Occupation: @GUAROCC @  Subjective:  Left hip pain  History of Present Illness: Alicia [name] is a 78 y.o. female seen in consultation per request of Dr. Collene Mares.  According to patient her symptoms started about 2 weeks ago with left hip pain.  She states the pain eventually moved to her lower back.  She was also suffering from constipation for 1 week and she thought her symptoms were related to that.  She states she went to the emergency room where she had CT scan which showed some abnormalities.  She was evaluated by Dr. Collene Mares today who has a scheduled MRI of her abdomen to rule out vasculitis.  Patient states that she has been experiencing left hip pain for the last 1 day.  She also has problems with the right shoulder joint rotator cuff tear in the past and which was repaired she has chronic pain in her right shoulder and also some discomfort in her left shoulder.  She has longstanding history of osteoarthritis in her hands which causes discomfort.  She also has longstanding history of disc disease of cervical spine.  Patient states she had MRI of her cervical spine in the past.  She also recalls seeing a rheumatologist several years ago who diagnosed her with osteoarthritis.  Activities of Daily Living:  Patient reports morning stiffness for 10 minutes.   Patient Reports nocturnal pain.  Difficulty dressing/grooming: Denies Difficulty climbing stairs: Reports Difficulty getting out of chair: Reports Difficulty using hands for taps, buttons, cutlery, and/or writing: Reports  Review of Systems  Constitutional: Positive for fatigue. Negative for night sweats, weight gain and weight loss.  HENT: Positive for mouth dryness. Negative for mouth sores, trouble swallowing, trouble swallowing and nose  dryness.   Eyes: Positive for dryness. Negative for pain, redness and visual disturbance.       Cataract surgery  Respiratory: Negative for cough, shortness of breath and difficulty breathing.   Cardiovascular: Negative for chest pain, palpitations, hypertension, irregular heartbeat and swelling in legs/feet.  Gastrointestinal: Positive for constipation. Negative for blood in stool and diarrhea.  Endocrine: Negative for increased urination.  Genitourinary: Negative for vaginal dryness.  Musculoskeletal: Positive for arthralgias, joint pain and morning stiffness. Negative for joint swelling, myalgias, muscle weakness, muscle tenderness and myalgias.  Skin: Negative for color change, rash, hair loss, skin tightness, ulcers and sensitivity to sunlight.  Allergic/Immunologic: Negative for susceptible to infections.  Neurological: Negative for dizziness, memory loss, night sweats and weakness.  Hematological: Negative for swollen glands.  Psychiatric/Behavioral: Positive for sleep disturbance. Negative for depressed mood. The patient is nervous/anxious.     PMFS History:  Patient Active Problem List   Diagnosis Date Noted  . Essential hypertension 10/12/2018  . Dyslipidemia 10/12/2018  . History of kidney stones 10/12/2018  . History of gastroesophageal reflux (GERD) 10/12/2018  . History of anxiety 10/12/2018  . Osteopenia of multiple sites 10/12/2018  . Diverticulosis 10/12/2018  . Chronic constipation 10/12/2018  . Primary osteoarthritis of both hands 10/12/2018  . RLS (restless legs syndrome) 03/28/2016    Past Medical History:  Diagnosis Date  . Anxiety   . GERD (gastroesophageal reflux disease)   .  Hypercholesteremia   . Hypertension   . Insomnia   . Kidney stone   . Osteoarthritis   . Osteopenia   . Restless leg syndrome   . RLS (restless legs syndrome) 03/28/2016    Family History  Problem Relation Age of Onset  . Heart attack Mother   . Depression Mother   . Stroke  Mother   . Diabetes Mother   . Heart attack Father   . COPD Sister   . COPD Brother   . COPD Brother   . COPD Sister   . Restless legs syndrome Son   . Restless legs syndrome Son   . Restless legs syndrome Daughter    Past Surgical History:  Procedure Laterality Date  . ABDOMINAL HYSTERECTOMY    . APPENDECTOMY    . BLEPHAROPLASTY  2019  . CATARACT EXTRACTION Right 02/2010  . CHOLECYSTECTOMY    . ROTATOR CUFF REPAIR Right    Social History   Social History Narrative   Lives alone in a one story home.  Has 3 children.     Semi retired.     Worked in [place] for [employer].  Education: high school.    Objective: Vital Signs: BP (!) 151/75 (BP Location: Right Arm, Patient Position: Sitting, Cuff Size: Normal)   Pulse 68   Resp 13   Ht 5\' 2"  (1.575 m)   Wt 148 lb (67.1 kg)   BMI 27.07 kg/m    Physical Exam Vitals signs and nursing note reviewed.  Constitutional:      Appearance: She is well-developed.  HENT:     Head: Normocephalic and atraumatic.  Eyes:     Conjunctiva/sclera: Conjunctivae normal.  Neck:     Musculoskeletal: Normal range of motion.  Cardiovascular:     Rate and Rhythm: Normal rate and regular rhythm.     Heart sounds: Normal heart sounds.  Pulmonary:     Effort: Pulmonary effort is normal.     Breath sounds: Normal breath sounds.  Abdominal:     General: Bowel sounds are normal.     Palpations: Abdomen is soft.  Lymphadenopathy:     Cervical: No cervical adenopathy.  Skin:    General: Skin is warm and dry.     Capillary Refill: Capillary refill takes less than 2 seconds.  Neurological:     Mental Status: She is alert and oriented to person, place, and time.  Psychiatric:        Behavior: Behavior normal.      Musculoskeletal Exam: C-spine some limitation with range of motion.  She had discomfort with range of motion of her lumbar spine with tenderness on palpation.  She discomfort range of motion of her right shoulder joint.  Elbow joints  with good range of motion.  She has severe DIP and PIP thickening and incomplete fist formation.  She had painful range of motion of her left hip joint.  She had tenderness on palpation of her bilateral trochanteric bursa.  Knee joints ankles and MTPs were in good range of motion with no synovitis.  CDAI Exam: CDAI Score: Not documented Patient Global Assessment: Not documented; Provider Global Assessment: Not documented Swollen: Not documented; Tender: Not documented Joint Exam   Not documented   There is currently no information documented on the homunculus. Go to the Rheumatology activity and complete the homunculus joint exam.  Investigation: No additional findings.  Imaging: Ct Abdomen Pelvis W Contrast  Result Date: 10/08/2018 CLINICAL DATA:  Bowel obstruction, high-grade Abd pain,  acute, generalized. Patient reports right flank/abdominal pain for 2 weeks, progressive. EXAM: CT ABDOMEN AND PELVIS WITH CONTRAST TECHNIQUE: Multidetector CT imaging of the abdomen and pelvis was performed using the standard protocol following bolus administration of intravenous contrast. CONTRAST:  155mL ISOVUE-300 IOPAMIDOL (ISOVUE-300) INJECTION 61% COMPARISON:  CT 03/10/2013 FINDINGS: Lower chest: No acute finding. No consolidation or pleural fluid. Few tiny subpleural nodules in the right lower lobe are unchanged from 2014 and considered benign. Hepatobiliary: No focal hepatic abnormality. Postcholecystectomy. Mild intra and extrahepatic biliary ductal dilatation, common bile duct is 11 mm dimension. Pancreas: No ductal dilatation or inflammation. Spleen: Normal in size with calcified granuloma. Adrenals/Urinary Tract: Normal adrenal glands. No hydronephrosis or perinephric edema. Mild prominence of bilateral renal pelvises. Homogeneous renal enhancement with symmetric excretion on delayed phase imaging. Tiny cortical hypodensity in the right kidney is too small to characterize. Urinary bladder is  physiologically distended without wall thickening. No visualized urolithiasis. Stomach/Bowel: Ingested material/fluid in the stomach without evidence of gastric wall thickening. No small bowel dilatation, obstruction, or inflammatory change. The appendix is not visualized, surgically absent per history. Colonic diverticulosis most prominent in the sigmoid colon without acute diverticulitis. Small to moderate colonic stool burden without colonic wall thickening. Vascular/Lymphatic: Ill-defined haziness and stranding adjacent to the origin and proximal SMA. Mild haziness about the proximal celiac artery to a lesser extent. No evidence of arterial occlusion. Portal and mesenteric veins are patent. Aortic atherosclerosis without aneurysm. No periaortic stranding. Circumaortic left renal vein. Small central mesenteric nodes on the left, not enlarged by size criteria. Reproductive: Status post hysterectomy. No adnexal masses. Other: No ascites or free air. Musculoskeletal: There are no acute or suspicious osseous abnormalities. Vertebral body hemangioma within L1. IMPRESSION: 1. Ill-defined haziness and stranding adjacent to the origin and proximal SMA and to lesser extent proximal celiac artery. Findings most suggestive of vasculitis. No associated bowel inflammation, obstruction, or ischemia. 2. Colonic diverticulosis without acute inflammation. 3. Postcholecystectomy with mild intra and extrahepatic biliary ductal dilatation. This is most likely a post reservoir effect in the setting of normal LFTs, and no dedicated further imaging is needed. 4.  Aortic Atherosclerosis (ICD10-I70.0). Electronically Signed   By: Keith Rake M.D.   On: 10/08/2018 23:39   Xr Hip Unilat W Or W/o Pelvis 2-3 Views Left  Result Date: 10/12/2018 Mild superior lateral narrowing was noted.  No chondrocalcinosis was noted. Impression: These findings are consistent with mild osteoarthritis of the hip joint.  Xr Lumbar Spine 2-3  Views  Result Date: 10/12/2018 Mild anterior spurring was noted.  No significant disc space narrowing was noted.  Facet joint arthropathy was noted.  Atherosclerosis of abdominal aorta was noted. Impression: These findings are consistent with mild spondylosis and facet joint arthropathy.   Recent Labs: Lab Results  Component Value Date   WBC 9.6 10/08/2018   HGB 13.9 10/08/2018   PLT 276 10/08/2018   NA 135 10/08/2018   K 3.4 (L) 10/08/2018   CL 103 10/08/2018   CO2 24 10/08/2018   GLUCOSE 114 (H) 10/08/2018   BUN 22 10/08/2018   CREATININE 0.72 10/08/2018   BILITOT 0.6 10/08/2018   ALKPHOS 76 10/08/2018   AST 20 10/08/2018   ALT 15 10/08/2018   PROT 7.3 10/08/2018   ALBUMIN 3.8 10/08/2018   CALCIUM 9.1 10/08/2018   GFRAA >60 10/08/2018  Lipase 29 normal, urine culture negative, lactic acid normal  Speciality Comments: No specialty comments available.  Procedures:  No procedures performed Allergies: Ibuprofen; Lexapro [escitalopram  oxalate]; Lisinopril; Mobic [meloxicam]; Nabumetone; Prilosec otc [omeprazole magnesium]; Zoloft [sertraline hcl]; and Sulfa antibiotics   Assessment / Plan:     Visit Diagnoses: Pain in left hip -patient has discomfort in her left hip with internal rotation.  X-ray showed mild osteoarthritic changes.  I will obtain following labs to evaluate this further.  Plan: XR HIP UNILAT W OR W/O PELVIS 2-3 VIEWS LEFT, Uric acid, Sedimentation rate, Rheumatoid factor, Cyclic citrul peptide antibody, IgG, 14-3-3 eta Protein  Trochanteric bursitis of both hips-she has tenderness on palpation over bilateral trochanteric bursa.  She has been also experiencing nocturnal pain over bilateral trochanteric bursa.  I would avoid cortisone injection as she has MRA coming up for evaluation of possible vasculitis.  I will bring her back after MRI for trochanteric bursa injection.  I have advised her to do stretches and take Tylenol for pain.  A handout on IT band exercises was  given.  Primary osteoarthritis of both hands-she has severe osteoarthritis in her hands but is not symptomatic.  She has decreased grip strength.  Chronic midline low back pain without sciatica -patient complains of lower back pain and has tenderness and on palpation over the lumbar region.  No vertebral fractures were noted.  She has mild disc disease and facet joint arthropathy.  Plan: XR Lumbar Spine 2-3 Views  Lower abdominal pain -she had CT of her abdomen which showed questionable vasculitis.  She was evaluated by Dr. Collene Mares today and had MRA is scheduled next week.  Plan: ANA, Pan-ANCA  Chronic right shoulder pain - Status post rotator cuff tear repair.  She has chronic right shoulder pain.  Diverticulosis  Chronic constipation-she had recent evaluation in the emergency room for chronic constipation.  Essential hypertension-her blood pressure is elevated.  Have advised her to monitor blood pressure closely.  Dyslipidemia  History of gastroesophageal reflux (GERD)  History of kidney stones  RLS (restless legs syndrome)  History of anxiety and depression-situational related to her husband's death.  Osteopenia of multiple sites   Orders: Orders Placed This Encounter  Procedures  . XR Lumbar Spine 2-3 Views  . XR HIP UNILAT W OR W/O PELVIS 2-3 VIEWS LEFT  . Uric acid  . Sedimentation rate  . Rheumatoid factor  . Cyclic citrul peptide antibody, IgG  . ANA  . Pan-ANCA  . 14-3-3 eta Protein   No orders of the defined types were placed in this encounter.   Face-to-face time spent with patient was 60 minutes. Greater than 50% of time was spent in counseling and coordination of care.  Follow-Up Instructions: Return for Joint pain.   Bo Merino, MD  Note - This record has been created using Editor, commissioning.  Chart creation errors have been sought, but may not always  have been located. Such creation errors do not reflect on  the standard of medical care.

## 2018-10-12 NOTE — Patient Instructions (Signed)
Iliotibial Band Syndrome Rehab  Ask your health care provider which exercises are safe for you. Do exercises exactly as told by your health care provider and adjust them as directed. It is normal to feel mild stretching, pulling, tightness, or discomfort as you do these exercises, but you should stop right away if you feel sudden pain or your pain gets worse. Do not begin these exercises until told by your health care provider.  Stretching and range of motion exercises  These exercises warm up your muscles and joints and improve the movement and flexibility of your hip and pelvis.  Exercise A: Quadriceps, prone    1. Lie on your abdomen on a firm surface, such as a bed or padded floor.  2. Bend your left / right knee and hold your ankle. If you cannot reach your ankle or pant leg, loop a belt around your foot and grab the belt instead.  3. Gently pull your heel toward your buttocks. Your knee should not slide out to the side. You should feel a stretch in the front of your thigh and knee.  4. Hold this position for __________ seconds.  Repeat __________ times. Complete this stretch __________ times a day.  Exercise B: Iliotibial band    1. Lie on your side with your left / right leg in the top position.  2. Bend both of your knees and grab your left / right ankle. Stretch out your bottom arm to help you balance.  3. Slowly bring your top knee back so your thigh goes behind your trunk.  4. Slowly lower your top leg toward the floor until you feel a gentle stretch on the outside of your left / right hip and thigh. If you do not feel a stretch and your knee will not fall farther, place the heel of your other foot on top of your knee and pull your knee down toward the floor with your foot.  5. Hold this position for __________ seconds.  Repeat __________ times. Complete this stretch __________ times a day.  Strengthening exercises  These exercises build strength and endurance in your hip and pelvis. Endurance is the  ability to use your muscles for a long time, even after they get tired.  Exercise C: Straight leg raises (hip abductors)    1. Lie on your side with your left / right leg in the top position. Lie so your head, shoulder, knee, and hip line up. You may bend your bottom knee to help you balance.  2. Roll your hips slightly forward so your hips are stacked directly over each other and your left / right knee is facing forward.  3. Tense the muscles in your outer thigh and lift your top leg 4-6 inches (10-15 cm).  4. Hold this position for __________ seconds.  5. Slowly return to the starting position. Let your muscles relax completely before doing another repetition.  Repeat __________ times. Complete this exercise __________ times a day.  Exercise D: Straight leg raises (hip extensors)  1. Lie on your abdomen on your bed or a firm surface. You can put a pillow under your hips if that is more comfortable.  2. Bend your left / right knee so your foot is straight up in the air.  3. Squeeze your buttock muscles and lift your left / right thigh off the bed. Do not let your back arch.  4. Tense this muscle as hard as you can without increasing any knee pain.  5. Hold   this position for __________ seconds.  6. Slowly lower your leg to the starting position and allow it to relax completely.  Repeat __________ times. Complete this exercise __________ times a day.  Exercise E: Hip hike  1. Stand sideways on a bottom step. Stand on your left / right leg with your other foot unsupported next to the step. You can hold onto the railing or wall if needed for balance.  2. Keep your knees straight and your torso square. Then, lift your left / right hip up toward the ceiling.  3. Slowly let your left / right hip lower toward the floor, past the starting position. Your foot should get closer to the floor. Do not lean or bend your knees.  Repeat __________ times. Complete this exercise __________ times a day.  This information is not  intended to replace advice given to you by your health care provider. Make sure you discuss any questions you have with your health care provider.  Document Released: 09/22/2005 Document Revised: 05/27/2016 Document Reviewed: 08/24/2015  [employer] Interactive Patient Education © 2019 [employer]

## 2018-10-13 NOTE — Progress Notes (Signed)
Office Visit Note  Patient: Alicia [name]             Date of Birth: 21-May-1941           MRN: [id number]             PCP: Gaynelle Arabian, MD Referring: Gaynelle Arabian, MD Visit Date: 10/20/2018 Occupation: '@GUAROCC'$ @  Subjective:  Pain in left hip.   History of Present Illness: Alicia [name] is a 78 y.o. female history of left trochanteric bursitis.  She states she has been having increased pain and discomfort in her left trochanteric area.  She has difficulty sleeping on her left side.  The right trochanteric bursae is not as painful.  Continues to have some pain and stiffness in her hands.  Her abdominal pain has improved over time.  She had MRI of her abdomen yesterday.  Activities of Daily Living:  Patient reports morning stiffness for 30 minutes.   Patient Reports nocturnal pain.  Difficulty dressing/grooming: Denies Difficulty climbing stairs: Denies Difficulty getting out of chair: Denies Difficulty using hands for taps, buttons, cutlery, and/or writing: Denies  Review of Systems  Constitutional: Positive for fatigue. Negative for night sweats, weight gain and weight loss.  HENT: Positive for mouth dryness. Negative for mouth sores, trouble swallowing, trouble swallowing and nose dryness.   Eyes: Positive for dryness. Negative for pain, redness, itching and visual disturbance.  Respiratory: Negative for cough, shortness of breath, wheezing and difficulty breathing.   Cardiovascular: Negative for chest pain, palpitations, hypertension, irregular heartbeat and swelling in legs/feet.  Gastrointestinal: Positive for abdominal pain and constipation. Negative for blood in stool and diarrhea.  Genitourinary: Positive for involuntary urination. Negative for painful urination, nocturia, pelvic pain and vaginal dryness.       Chronic  Musculoskeletal: Positive for arthralgias, joint pain and morning stiffness. Negative for joint swelling, myalgias, muscle weakness, muscle tenderness  and myalgias.  Skin: Negative for color change, rash, hair loss, redness, skin tightness, ulcers and sensitivity to sunlight.  Allergic/Immunologic: Negative for susceptible to infections.  Neurological: Negative for dizziness, light-headedness, headaches, memory loss, night sweats and weakness.  Hematological: Negative for bruising/bleeding tendency and swollen glands.  Psychiatric/Behavioral: Negative for depressed mood, confusion and sleep disturbance. The patient is not nervous/anxious.     PMFS History:  Patient Active Problem List   Diagnosis Date Noted  . Essential hypertension 10/12/2018  . Dyslipidemia 10/12/2018  . History of kidney stones 10/12/2018  . History of gastroesophageal reflux (GERD) 10/12/2018  . History of anxiety 10/12/2018  . Osteopenia of multiple sites 10/12/2018  . Diverticulosis 10/12/2018  . Chronic constipation 10/12/2018  . Primary osteoarthritis of both hands 10/12/2018  . RLS (restless legs syndrome) 03/28/2016    Past Medical History:  Diagnosis Date  . Anxiety   . GERD (gastroesophageal reflux disease)   . Hypercholesteremia   . Hypertension   . Insomnia   . Kidney stone   . Osteoarthritis   . Osteopenia   . Restless leg syndrome   . RLS (restless legs syndrome) 03/28/2016    Family History  Problem Relation Age of Onset  . Heart attack Mother   . Depression Mother   . Stroke Mother   . Diabetes Mother   . Heart attack Father   . COPD Sister   . COPD Brother   . COPD Brother   . COPD Sister   . Restless legs syndrome Son   . Restless legs syndrome Son   . Restless legs  syndrome Daughter    Past Surgical History:  Procedure Laterality Date  . ABDOMINAL HYSTERECTOMY    . APPENDECTOMY    . BLEPHAROPLASTY  2019  . CATARACT EXTRACTION Right 02/2010  . CHOLECYSTECTOMY    . ROTATOR CUFF REPAIR Right    Social History   Social History Narrative   Lives alone in a one story home.  Has 3 children.     Semi retired.     Worked in  [place] for [employer].  Education: high school.    Objective: Vital Signs: BP (!) 151/79 (BP Location: Left Arm, Patient Position: Sitting, Cuff Size: Normal)   Pulse 74   Resp 13   Ht '5\' 2"'$  (1.575 m)   Wt 150 lb (68 kg)   BMI 27.44 kg/m    Physical Exam Vitals signs and nursing note reviewed.  Constitutional:      Appearance: She is well-developed.  HENT:     Head: Normocephalic and atraumatic.  Eyes:     Conjunctiva/sclera: Conjunctivae normal.  Neck:     Musculoskeletal: Normal range of motion.  Cardiovascular:     Rate and Rhythm: Normal rate and regular rhythm.     Heart sounds: Normal heart sounds.  Pulmonary:     Effort: Pulmonary effort is normal.     Breath sounds: Normal breath sounds.  Abdominal:     General: Bowel sounds are normal.     Palpations: Abdomen is soft.  Lymphadenopathy:     Cervical: No cervical adenopathy.  Skin:    General: Skin is warm and dry.     Capillary Refill: Capillary refill takes less than 2 seconds.  Neurological:     Mental Status: She is alert and oriented to person, place, and time.  Psychiatric:        Behavior: Behavior normal.      Musculoskeletal Exam: C-spine thoracic lumbar spine good range of motion.  Shoulder joints elbow joints wrist joints with good range of motion.  She has bilateral DIP and PIP thickening in her hands consistent with osteoarthritis.  She has limitation of range of motion of bilateral hip joints.  She had tenderness on palpation of bilateral trochanteric bursa.  Knee joints ankles MTPs PIPs with good range of motion with no synovitis.  CDAI Exam: CDAI Score: Not documented Patient Global Assessment: Not documented; Provider Global Assessment: Not documented Swollen: Not documented; Tender: Not documented Joint Exam   Not documented   There is currently no information documented on the homunculus. Go to the Rheumatology activity and complete the homunculus joint exam.  Investigation: No additional  findings.  Imaging: Ct Abdomen Pelvis W Contrast  Result Date: 10/08/2018 CLINICAL DATA:  Bowel obstruction, high-grade Abd pain, acute, generalized. Patient reports right flank/abdominal pain for 2 weeks, progressive. EXAM: CT ABDOMEN AND PELVIS WITH CONTRAST TECHNIQUE: Multidetector CT imaging of the abdomen and pelvis was performed using the standard protocol following bolus administration of intravenous contrast. CONTRAST:  140m ISOVUE-300 IOPAMIDOL (ISOVUE-300) INJECTION 61% COMPARISON:  CT 03/10/2013 FINDINGS: Lower chest: No acute finding. No consolidation or pleural fluid. Few tiny subpleural nodules in the right lower lobe are unchanged from 2014 and considered benign. Hepatobiliary: No focal hepatic abnormality. Postcholecystectomy. Mild intra and extrahepatic biliary ductal dilatation, common bile duct is 11 mm dimension. Pancreas: No ductal dilatation or inflammation. Spleen: Normal in size with calcified granuloma. Adrenals/Urinary Tract: Normal adrenal glands. No hydronephrosis or perinephric edema. Mild prominence of bilateral renal pelvises. Homogeneous renal enhancement with symmetric excretion on  delayed phase imaging. Tiny cortical hypodensity in the right kidney is too small to characterize. Urinary bladder is physiologically distended without wall thickening. No visualized urolithiasis. Stomach/Bowel: Ingested material/fluid in the stomach without evidence of gastric wall thickening. No small bowel dilatation, obstruction, or inflammatory change. The appendix is not visualized, surgically absent per history. Colonic diverticulosis most prominent in the sigmoid colon without acute diverticulitis. Small to moderate colonic stool burden without colonic wall thickening. Vascular/Lymphatic: Ill-defined haziness and stranding adjacent to the origin and proximal SMA. Mild haziness about the proximal celiac artery to a lesser extent. No evidence of arterial occlusion. Portal and mesenteric veins  are patent. Aortic atherosclerosis without aneurysm. No periaortic stranding. Circumaortic left renal vein. Small central mesenteric nodes on the left, not enlarged by size criteria. Reproductive: Status post hysterectomy. No adnexal masses. Other: No ascites or free air. Musculoskeletal: There are no acute or suspicious osseous abnormalities. Vertebral body hemangioma within L1. IMPRESSION: 1. Ill-defined haziness and stranding adjacent to the origin and proximal SMA and to lesser extent proximal celiac artery. Findings most suggestive of vasculitis. No associated bowel inflammation, obstruction, or ischemia. 2. Colonic diverticulosis without acute inflammation. 3. Postcholecystectomy with mild intra and extrahepatic biliary ductal dilatation. This is most likely a post reservoir effect in the setting of normal LFTs, and no dedicated further imaging is needed. 4.  Aortic Atherosclerosis (ICD10-I70.0). Electronically Signed   By: Keith Rake M.D.   On: 10/08/2018 23:39   Mr Jodene Nam Abdomen W Wo Contrast  Result Date: 10/19/2018 CLINICAL DATA:  Low back pain and abdominal pain with prior CT demonstrating inflammation surrounding proximal aspects of the celiac axis and superior mesenteric artery suggestive of vasculitis. EXAM: MRA ABDOMEN WITH CONTRAST TECHNIQUE: Multiplanar, multiecho pulse sequences of the abdomen were obtained with intravenous contrast. Angiographic images of abdomen were obtained using MRA technique with intravenous contrast. CONTRAST:  39m MULTIHANCE GADOBENATE DIMEGLUMINE 529 MG/ML IV SOLN COMPARISON:  CT of the abdomen and pelvis on 10/08/2018 FINDINGS: The aorta shows normal patency without evidence atherosclerosis, aneurysm or stenosis. No overt wall thickening or inflammation to suggest active aortitis. Suggestion of mild wall thickening of the celiac trunk without luminal stenosis or branch vessel occlusion. Inflammation of surrounding fat is less well evaluated compared to the prior  CT. Suggestion of wall thickening and some surrounding edema of the proximal superior mesenteric artery trunk without luminal stenosis or branch vessel occlusion. Bilateral single right renal artery and 2 separate left renal arteries show normal patency. The IMA is normally patent. Visualized iliac arteries show normal patency. No other nonvascular findings are identified by MRI in the abdomen. IMPRESSION: Similar findings by MRA as by recent CT of mild wall thickening and inflammation involving the celiac trunk and proximal superior mesenteric artery. Inflammation is less well demonstrated compared to CT. No evidence of luminal stenosis or branch vessel occlusion. CTA of the abdomen and pelvis with additional venous phase imaging would be a better way to follow the abnormalities to evaluate for resolution or worsening depending on further workup and treatment. Electronically Signed   By: GAletta EdouardM.D.   On: 10/19/2018 17:23   Xr Hip Unilat W Or W/o Pelvis 2-3 Views Left  Result Date: 10/12/2018 Mild superior lateral narrowing was noted.  No chondrocalcinosis was noted. Impression: These findings are consistent with mild osteoarthritis of the hip joint.  Xr Lumbar Spine 2-3 Views  Result Date: 10/12/2018 Mild anterior spurring was noted.  No significant disc space narrowing was noted.  Facet  joint arthropathy was noted.  Atherosclerosis of abdominal aorta was noted. Impression: These findings are consistent with mild spondylosis and facet joint arthropathy.   Recent Labs: Lab Results  Component Value Date   WBC 9.6 10/08/2018   HGB 13.9 10/08/2018   PLT 276 10/08/2018   NA 135 10/08/2018   K 3.4 (L) 10/08/2018   CL 103 10/08/2018   CO2 24 10/08/2018   GLUCOSE 114 (H) 10/08/2018   BUN 22 10/08/2018   CREATININE 0.72 10/08/2018   BILITOT 0.6 10/08/2018   ALKPHOS 76 10/08/2018   AST 20 10/08/2018   ALT 15 10/08/2018   PROT 7.3 10/08/2018   ALBUMIN 3.8 10/08/2018   CALCIUM 9.1  10/08/2018   GFRAA >60 10/08/2018  ESR 31, ANCA negative, MPO negative, uric acid 3.4, RF negative, anti-CCP negative  Speciality Comments: No specialty comments available.  Procedures:  Large Joint Inj: L greater trochanter on 10/20/2018 3:21 PM Indications: pain Details: 27 G 1.5 in needle, lateral approach  Arthrogram: No  Medications: 40 mg triamcinolone acetonide 40 MG/ML; 1 mL lidocaine 1 % Aspirate: 0 mL Outcome: tolerated well, no immediate complications Procedure, treatment alternatives, risks and benefits explained, specific risks discussed. Consent was given by the patient. Immediately prior to procedure a time out was called to verify the correct patient, procedure, equipment, support staff and site/side marked as required. Patient was prepped and draped in the usual sterile fashion.     Allergies: Ibuprofen; Lexapro [escitalopram oxalate]; Lisinopril; Mobic [meloxicam]; Nabumetone; Prilosec otc [omeprazole magnesium]; Zoloft [sertraline hcl]; and Sulfa antibiotics   Assessment / Plan:     Visit Diagnoses: Trochanteric bursitis of both hips-she has been having pain and discomfort in her bilateral trochanteric bursa.  The left trochanteric bursa is more painful than the right.  She has been having nocturnal pain.  After informed consent was obtained left trochanteric bursa was injected with cortisone as described above.  She tolerated the procedure well.  I have advised her to monitor blood pressure closely.  She was given handout on IT band exercises during the last visit.  Have advised her to continue with the stretching exercises.  If she has improvement in her symptoms she can come in for the right trochanteric bursa injection.  Primary osteoarthritis of left hip - mild.  The x-rays obtained last visit showed mild osteoarthritis.  She has some limitation with range of motion.  All autoimmune work-up was negative.  Labs were discussed with patient.  Primary osteoarthritis of  both hands-she continues to have pain and discomfort in the bilateral hands.  Joint protection muscle strengthening was discussed.  Lower abdominal pain-patient states that the lower back pain has gradually improved.  She has been followed by Dr. Collene Mares.  Other medical problems are listed as follows:  Chronic constipation  Diverticulosis  Dyslipidemia  Essential hypertension-her blood pressure is a still elevated.  Have advised her to follow-up with her PCP for treatment of hypertension.  History of anxiety  History of gastroesophageal reflux (GERD)  History of kidney stones  Osteopenia of multiple sites   Orders: No orders of the defined types were placed in this encounter.  No orders of the defined types were placed in this encounter.    Follow-Up Instructions: Return in about 4 weeks (around 11/17/2018) for Osteoarthritis.   Bo Merino, MD  Note - This record has been created using Editor, commissioning.  Chart creation errors have been sought, but may not always  have been located. Such creation errors do  not reflect on  the standard of medical care.

## 2018-10-16 LAB — 14-3-3 ETA PROTEIN: 14-3-3 eta Protein: <0.2 ng/mL (ref <0.2)

## 2018-10-16 LAB — CYCLIC CITRUL PEPTIDE ANTIBODY, IGG: Cyclic Citrullin Peptide Ab: <16 UNITS

## 2018-10-16 LAB — URIC ACID: Uric Acid, Serum: 3.4 mg/dL (ref 2.5–7.0)

## 2018-10-16 LAB — ANA: Anti Nuclear Antibody(ANA): NEGATIVE

## 2018-10-16 LAB — PAN-ANCA
ANCA Screen: NEGATIVE
Myeloperoxidase Abs: <1 AI
Serine Protease 3: <1 AI

## 2018-10-16 LAB — SEDIMENTATION RATE: Sed Rate: 31 mm/h — ABNORMAL HIGH (ref 0–30)

## 2018-10-16 LAB — RHEUMATOID FACTOR: Rheumatoid fact SerPl-aCnc: <14 IU/mL (ref <14)

## 2018-10-18 NOTE — Progress Notes (Signed)
Will discuss at the fu visit

## 2018-10-19 ENCOUNTER — Ambulatory Visit
Admission: RE | Admit: 2018-10-19 | Discharge: 2018-10-19 | Disposition: A | Discharge status: Home / Self Care | Payer: Medicare Other | Source: Ambulatory Visit | Attending: Gastroenterology | Admitting: Gastroenterology

## 2018-10-19 DIAGNOSIS — R109 Unspecified abdominal pain: Secondary | ICD-10-CM | POA: Diagnosis not present

## 2018-10-19 MED ORDER — GADOBENATE DIMEGLUMINE 529 MG/ML IV SOLN
13.0000 mL | Freq: Once | INTRAVENOUS | Status: AC | PRN
  Administered 2018-10-19: 13 mL via INTRAVENOUS

## 2018-10-19 NOTE — Progress Notes (Signed)
Follow-up Visit   Date: 10/20/18    SONDA COPPENS MRN: [id number] DOB: [birth year]   Interim History: Alicia [name] is a 78 y.o. right-handed Caucasian female with hypertension, GERD, hyperlipidemia, RLS, and insomia returning to the clinic for follow-up of RLS.  The patient was accompanied to the clinic by self.  History of present illness: She was diagnosed with RLS around early 2000s.  She has dull and deep ache involving the lower legs which is worse at rest such as driving or riding on plane for a long time and at nighttime.  The discomfort is improved with activity such as walking for 15-20 minutes.  She was started on ropinorole about 10 years ago and increased the dose about 4 years ago because symptoms started occuring sooner in the evening. Over the past year, she started having worsening discomfort and recently she began taking 1mg  TID without any added benefit, and in fact, feels that the discomfort is worsening. No numbness/tingling, weakness, or imbalance.   UPDATE 07/21/2016:  At her last visit, I attempted to reduce her dopamine agonist because of augmentation and transitioned her to gabapentin; however, she did not see any benefit with gabapentin, so discontinued this and restarted ropinorole.   She takes ropinorole 1mg  twice daily which seems to help her.  She has breakthrough pain in the afternoon around 1-2pm.  Her morning and nighttime symptoms are well-controlled.  No new complaints.    UPDATE 10/20/2018:  She is here for 1 year follow-up visit and needs refills for requip which she takes 1mg  TID.  She continues to get adequate relief with this for RLS and does not have breakthrough symptoms or sleep disruption.  She does not have side effects.   She is having right hip pain due to bursitis and will be having injection later today.    Medications:  Current Outpatient Medications on File Prior to Visit  Medication Sig Dispense Refill  . amLODipine (NORVASC) 5 MG tablet  Take 2.5 mg by mouth daily.    . calcium-vitamin D (OSCAL WITH D) 500-200 MG-UNIT per tablet Take 1 tablet by mouth daily.    . Celecoxib (CELEBREX PO) Take by mouth daily.    Marland Kitchen glucosamine-chondroitin 500-400 MG tablet Take 1 tablet by mouth 3 (three) times daily.    Marland Kitchen LORazepam (ATIVAN) 0.5 MG tablet     . losartan (COZAAR) 25 MG tablet Take 25 mg by mouth daily.    . metoprolol succinate (TOPROL-XL) 50 MG 24 hr tablet     . multivitamin-lutein (OCUVITE-LUTEIN) CAPS Take 1 capsule by mouth daily.    . OMEGA 3 1000 MG CAPS Take 1 capsule by mouth daily.    Marland Kitchen omeprazole (PRILOSEC) 40 MG capsule     . polyethylene glycol (MIRALAX) packet Take 17 g by mouth daily. 14 each 0  . simvastatin (ZOCOR) 20 MG tablet     . traZODone (DESYREL) 50 MG tablet      No current facility-administered medications on file prior to visit.     Allergies:  Allergies  Allergen Reactions  . Ibuprofen     GI UPSET  . Lexapro [Escitalopram Oxalate]     GERD  . Lisinopril Cough  . Mobic [Meloxicam]     LEG PAIN  . Nabumetone     GI UPSET  . Prilosec Otc [Omeprazole Magnesium]     ARM SPASM  . Zoloft [Sertraline Hcl]     INSOMNIA  . Sulfa Antibiotics Rash  Review of Systems:  CONSTITUTIONAL: No fevers, chills, night sweats, or weight loss.  EYES: No visual changes or eye pain ENT: No hearing changes.  No history of nose bleeds.   RESPIRATORY: No cough, wheezing and shortness of breath.   CARDIOVASCULAR: Negative for chest pain, and palpitations.   GI: Negative for abdominal discomfort, blood in stools or black stools.  No recent change in bowel habits.   GU:  No history of incontinence.   MUSCLOSKELETAL: No history of joint pain or swelling.  No myalgias.   SKIN: Negative for lesions, rash, and itching.   ENDOCRINE: Negative for cold or heat intolerance, polydipsia or goiter.   PSYCH:  No depression or anxiety symptoms.   NEURO: As Above.   Vital Signs:  BP 130/70   Pulse (!) 58   Ht 5\' 2"   (1.575 m)   Wt 145 lb (65.8 kg)   SpO2 97%   BMI 26.52 kg/m   General Medical Exam:   General:  Well appearing, comfortable  Eyes/ENT: see cranial nerve examination.   Neck: No masses appreciated.  Full range of motion without tenderness.  No carotid bruits. Respiratory:  Clear to auscultation, good air entry bilaterally.   Cardiac:  Regular rate and rhythm, no murmur.   Ext:  No edema  Neurological Exam: MENTAL STATUS including orientation to time, place, person, recent and remote memory, attention span and concentration, language, and fund of knowledge is normal.  Speech is not dysarthric.  CRANIAL NERVES:   Pupils equal round and reactive to light.  Normal conjugate, extra-ocular eye movements in all directions of gaze.  Face is symmetric.  MOTOR:  Motor strength is 5/5 in all extremities  COORDINATION/GAIT:    Gait narrow based and stable.   Data: Lab Results  Component Value Date   FERRITIN 83.7 03/28/2016    IMPRESSION/PLAN: Restless leg syndrome, well-controlled on ropinorole 1mg  (7am, 1pm, and 9pm).  I will refill this for another year, and also request her PCP to take over refills since she has not needed any medication changes for the past few years.   If RLS gets worse, she is always welcome to come back and see me.    Thank you for allowing me to participate in patient's care.  If I can answer any additional questions, I would be pleased to do so.    Sincerely,    Aldric Wenzler K. Posey Pronto, DO

## 2018-10-20 ENCOUNTER — Ambulatory Visit (INDEPENDENT_AMBULATORY_CARE_PROVIDER_SITE_OTHER): Payer: Medicare Other | Admitting: Neurology

## 2018-10-20 ENCOUNTER — Encounter: Payer: Self-pay | Admitting: Neurology

## 2018-10-20 ENCOUNTER — Ambulatory Visit (INDEPENDENT_AMBULATORY_CARE_PROVIDER_SITE_OTHER): Payer: Medicare Other | Admitting: Rheumatology

## 2018-10-20 ENCOUNTER — Encounter: Payer: Self-pay | Admitting: Physician Assistant

## 2018-10-20 VITALS — BP 151/79 | HR 74 | Resp 13 | Ht 62.0 in | Wt 150.0 lb

## 2018-10-20 VITALS — BP 130/70 | HR 58 | Ht 62.0 in | Wt 145.0 lb

## 2018-10-20 DIAGNOSIS — M19041 Primary osteoarthritis, right hand: Secondary | ICD-10-CM | POA: Diagnosis not present

## 2018-10-20 DIAGNOSIS — I10 Essential (primary) hypertension: Secondary | ICD-10-CM | POA: Diagnosis not present

## 2018-10-20 DIAGNOSIS — E785 Hyperlipidemia, unspecified: Secondary | ICD-10-CM | POA: Diagnosis not present

## 2018-10-20 DIAGNOSIS — M7062 Trochanteric bursitis, left hip: Secondary | ICD-10-CM | POA: Diagnosis not present

## 2018-10-20 DIAGNOSIS — K5790 Diverticulosis of intestine, part unspecified, without perforation or abscess without bleeding: Secondary | ICD-10-CM

## 2018-10-20 DIAGNOSIS — G2581 Restless legs syndrome: Secondary | ICD-10-CM | POA: Diagnosis not present

## 2018-10-20 DIAGNOSIS — M1612 Unilateral primary osteoarthritis, left hip: Secondary | ICD-10-CM | POA: Diagnosis not present

## 2018-10-20 DIAGNOSIS — R1030 Lower abdominal pain, unspecified: Secondary | ICD-10-CM | POA: Diagnosis not present

## 2018-10-20 DIAGNOSIS — M7061 Trochanteric bursitis, right hip: Secondary | ICD-10-CM | POA: Diagnosis not present

## 2018-10-20 DIAGNOSIS — M19042 Primary osteoarthritis, left hand: Secondary | ICD-10-CM

## 2018-10-20 DIAGNOSIS — Z8719 Personal history of other diseases of the digestive system: Secondary | ICD-10-CM

## 2018-10-20 DIAGNOSIS — M8589 Other specified disorders of bone density and structure, multiple sites: Secondary | ICD-10-CM | POA: Diagnosis not present

## 2018-10-20 DIAGNOSIS — Z87442 Personal history of urinary calculi: Secondary | ICD-10-CM | POA: Diagnosis not present

## 2018-10-20 DIAGNOSIS — Z8659 Personal history of other mental and behavioral disorders: Secondary | ICD-10-CM

## 2018-10-20 DIAGNOSIS — K5909 Other constipation: Secondary | ICD-10-CM

## 2018-10-20 MED ORDER — TRIAMCINOLONE ACETONIDE 40 MG/ML IJ SUSP
40.0000 mg | INTRAMUSCULAR | Status: AC | PRN
  Administered 2018-10-20: 40 mg via INTRA_ARTICULAR

## 2018-10-20 MED ORDER — ROPINIROLE HCL 1 MG PO TABS: 1.0000 mg | ORAL_TABLET | Freq: Three times a day (TID) | ORAL | 3 refills | Status: AC

## 2018-10-20 MED ORDER — LIDOCAINE HCL 1 % IJ SOLN
1.0000 mL | INTRAMUSCULAR | Status: AC | PRN
  Administered 2018-10-20: 1 mL

## 2018-10-20 NOTE — Patient Instructions (Signed)
It was great to see you today!  You can ask Dr. Marisue Humble if he can write your future prescriptions for ropinirole if your RLS says well-controlled.  You are always welcome to come back and see me, if your symptoms get worse.

## 2018-11-11 ENCOUNTER — Ambulatory Visit: Payer: Medicare Other | Admitting: Rheumatology

## 2018-11-15 NOTE — Progress Notes (Signed)
Office Visit Note  Patient: Alicia [name]             Date of Birth: 1941-08-15           MRN: [id number]             PCP: Gaynelle Arabian, MD Referring: Gaynelle Arabian, MD Visit Date: 11/17/2018 Occupation: @GUAROCC @  Subjective:  Right trochanteric bursitis   History of Present Illness: Alicia [name] is a 78 y.o. female with history of osteoarthritis and trochanteric bursitis bilaterally.  She presents today with right hip and right trochanter bursitis pain.  She states that the pain is been radiating down into her groin.  She states at times she has to lift her right leg manually due to the discomfort she experiences.  She states that the left trochanter bursitis injection performed on 10/18/2018 provided significant relief and she has no discomfort at this time.  She states that she has not tried performing stretching exercises yet due to being sick with an upper respiratory tract infection for the past 3 weeks.  She reports that she takes Celebrex for pain relief.  She states that she does have pain and stiffness in both hands but denies any joint swelling.  She denies any other joint pain or joint swelling at this time.   Activities of Daily Living:  Patient reports morning stiffness for 30 minutes.   Patient Denies nocturnal pain.  Difficulty dressing/grooming: Denies Difficulty climbing stairs: Denies Difficulty getting out of chair: Denies Difficulty using hands for taps, buttons, cutlery, and/or writing: Reports  Review of Systems  Constitutional: Positive for fatigue.  HENT: Positive for mouth dryness. Negative for mouth sores and nose dryness.   Eyes: Positive for dryness. Negative for pain and visual disturbance.  Respiratory: Negative for cough, hemoptysis, shortness of breath and difficulty breathing.   Cardiovascular: Positive for swelling in legs/feet. Negative for chest pain, palpitations and hypertension.  Gastrointestinal: Negative for blood in stool, constipation  and diarrhea.  Endocrine: Negative for increased urination.  Genitourinary: Negative for difficulty urinating and painful urination.  Musculoskeletal: Positive for arthralgias, joint pain and morning stiffness. Negative for joint swelling, myalgias, muscle weakness, muscle tenderness and myalgias.  Skin: Negative for color change, pallor, rash, hair loss, nodules/bumps, skin tightness, ulcers and sensitivity to sunlight.  Neurological: Negative for dizziness, numbness, headaches and weakness.  Hematological: Negative for bruising/bleeding tendency and swollen glands.  Psychiatric/Behavioral: Negative for depressed mood and sleep disturbance. The patient is not nervous/anxious.     PMFS History:  Patient Active Problem List   Diagnosis Date Noted  . Essential hypertension 10/12/2018  . Dyslipidemia 10/12/2018  . History of kidney stones 10/12/2018  . History of gastroesophageal reflux (GERD) 10/12/2018  . History of anxiety 10/12/2018  . Osteopenia of multiple sites 10/12/2018  . Diverticulosis 10/12/2018  . Chronic constipation 10/12/2018  . Primary osteoarthritis of both hands 10/12/2018  . RLS (restless legs syndrome) 03/28/2016    Past Medical History:  Diagnosis Date  . Anxiety   . Bursitis   . GERD (gastroesophageal reflux disease)   . Hypercholesteremia   . Hypertension   . Insomnia   . Kidney stone   . Osteoarthritis   . Osteopenia   . Restless leg syndrome   . RLS (restless legs syndrome) 03/28/2016    Family History  Problem Relation Age of Onset  . Heart attack Mother   . Depression Mother   . Stroke Mother   . Diabetes Mother   .  Heart attack Father   . COPD Sister   . COPD Brother   . COPD Brother   . COPD Sister   . Restless legs syndrome Son   . Restless legs syndrome Son   . Restless legs syndrome Daughter    Past Surgical History:  Procedure Laterality Date  . ABDOMINAL HYSTERECTOMY    . APPENDECTOMY    . BLEPHAROPLASTY  2019  . CATARACT  EXTRACTION Right 02/2010  . CHOLECYSTECTOMY    . ROTATOR CUFF REPAIR Right    Social History   Social History Narrative   Lives alone in a one story home.  Has 3 children.     Semi retired.     Worked in [place] for [employer].  Education: high school.    There is no immunization history on file for this patient.   Objective: Vital Signs: BP 109/60 (BP Location: Left Arm, Patient Position: Sitting, Cuff Size: Normal)   Pulse 68   Resp 18   Ht 5\' 2"  (1.575 m)   Wt 145 lb (65.8 kg)   BMI 26.52 kg/m    Physical Exam Vitals signs and nursing note reviewed.  Constitutional:      Appearance: She is well-developed.  HENT:     Head: Normocephalic and atraumatic.  Eyes:     Conjunctiva/sclera: Conjunctivae normal.  Neck:     Musculoskeletal: Normal range of motion.  Cardiovascular:     Rate and Rhythm: Normal rate and regular rhythm.     Heart sounds: Normal heart sounds.  Pulmonary:     Effort: Pulmonary effort is normal.     Breath sounds: Normal breath sounds.  Abdominal:     General: Bowel sounds are normal.     Palpations: Abdomen is soft.  Lymphadenopathy:     Cervical: No cervical adenopathy.  Skin:    General: Skin is warm and dry.     Capillary Refill: Capillary refill takes less than 2 seconds.  Neurological:     Mental Status: She is alert and oriented to person, place, and time.  Psychiatric:        Behavior: Behavior normal.      Musculoskeletal Exam: C-spine limited range of motion with some discomfort.  Thoracic and lumbar spine good range of motion.  Shoulders, elbows, wrist joints good range of motion with no synovitis.  She has incomplete fist formation on the right hand.  PIP and DIP synovial thickening consistent with osteoarthritis.  Bilateral CMC joint synovial thickening. Right hip limited ROM with discomfort.  Tenderness over right trochanteric bursa.  Left hip slightly limited ROM.  Knee joints, ankle joints, MTPs, PIPs, and DIPs good ROM with no  discomfort.  No warmth or effusion of knee joints.  Bilateral knee joint crepitus. No tenderness or swelling of ankle joints.    CDAI Exam: CDAI Score: Not documented Patient Global Assessment: Not documented; Provider Global Assessment: Not documented Swollen: Not documented; Tender: Not documented Joint Exam   Not documented   There is currently no information documented on the homunculus. Go to the Rheumatology activity and complete the homunculus joint exam.  Investigation: No additional findings.  Imaging: Mr Jodene Nam Abdomen W Wo Contrast  Result Date: 10/19/2018 CLINICAL DATA:  Low back pain and abdominal pain with prior CT demonstrating inflammation surrounding proximal aspects of the celiac axis and superior mesenteric artery suggestive of vasculitis. EXAM: MRA ABDOMEN WITH CONTRAST TECHNIQUE: Multiplanar, multiecho pulse sequences of the abdomen were obtained with intravenous contrast. Angiographic images of abdomen  were obtained using MRA technique with intravenous contrast. CONTRAST:  43mL MULTIHANCE GADOBENATE DIMEGLUMINE 529 MG/ML IV SOLN COMPARISON:  CT of the abdomen and pelvis on 10/08/2018 FINDINGS: The aorta shows normal patency without evidence atherosclerosis, aneurysm or stenosis. No overt wall thickening or inflammation to suggest active aortitis. Suggestion of mild wall thickening of the celiac trunk without luminal stenosis or branch vessel occlusion. Inflammation of surrounding fat is less well evaluated compared to the prior CT. Suggestion of wall thickening and some surrounding edema of the proximal superior mesenteric artery trunk without luminal stenosis or branch vessel occlusion. Bilateral single right renal artery and 2 separate left renal arteries show normal patency. The IMA is normally patent. Visualized iliac arteries show normal patency. No other nonvascular findings are identified by MRI in the abdomen. IMPRESSION: Similar findings by MRA as by recent CT of mild  wall thickening and inflammation involving the celiac trunk and proximal superior mesenteric artery. Inflammation is less well demonstrated compared to CT. No evidence of luminal stenosis or branch vessel occlusion. CTA of the abdomen and pelvis with additional venous phase imaging would be a better way to follow the abnormalities to evaluate for resolution or worsening depending on further workup and treatment. Electronically Signed   By: Aletta Edouard M.D.   On: 10/19/2018 17:23    Recent Labs: Lab Results  Component Value Date   WBC 9.6 10/08/2018   HGB 13.9 10/08/2018   PLT 276 10/08/2018   NA 135 10/08/2018   K 3.4 (L) 10/08/2018   CL 103 10/08/2018   CO2 24 10/08/2018   GLUCOSE 114 (H) 10/08/2018   BUN 22 10/08/2018   CREATININE 0.72 10/08/2018   BILITOT 0.6 10/08/2018   ALKPHOS 76 10/08/2018   AST 20 10/08/2018   ALT 15 10/08/2018   PROT 7.3 10/08/2018   ALBUMIN 3.8 10/08/2018   CALCIUM 9.1 10/08/2018   GFRAA >60 10/08/2018    Speciality Comments: No specialty comments available.  Procedures:  Large Joint Inj: R greater trochanter on 11/17/2018 11:13 AM Indications: pain Details: 27 G 1.5 in needle, lateral approach  Arthrogram: No  Medications: 1 mL lidocaine 1 %; 40 mg triamcinolone acetonide 40 MG/ML Aspirate: 0 mL Outcome: tolerated well, no immediate complications Procedure, treatment alternatives, risks and benefits explained, specific risks discussed. Consent was given by the patient. Immediately prior to procedure a time out was called to verify the correct patient, procedure, equipment, support staff and site/side marked as required. Patient was prepped and draped in the usual sterile fashion.     Allergies: Ibuprofen; Lexapro [escitalopram oxalate]; Lisinopril; Mobic [meloxicam]; Nabumetone; Prilosec otc [omeprazole magnesium]; Zoloft [sertraline hcl]; and Sulfa antibiotics     Assessment / Plan:     Visit Diagnoses: Trochanteric bursitis of both hips:  She presents today with right trochanteric bursitis.  She is requesting a cortisone injection.  She tolerated the procedure well.  Procedure note was completed above.  She was advised to monitor her blood pressure closely following the cortisone injection.  Aftercare was discussed.  She had a left trochanteric bursa injection on 10/20/2018 that provided significant relief.  She has no tenderness on exam today.  She was encouraged to start performing stretching exercises on a regular basis.  She still has a handout for stretching exercises at home.  She is advised to notify us if her symptoms are persistent.  Physical therapy will be a good option in the future if her symptoms persist.  Primary osteoarthritis of left hip -  XR-mild, autoimmune work up negative: She has slightly limited range of motion of the left hip on exam.  She has no discomfort at this time.  Pain in right hip: She has limited range of motion of the right hip with discomfort.  She is been having increased right groin pain.  She has right trochanteric bursitis as well.  At times she has been having to passively lift her right lower extremity due to the discomfort she has been experiencing.  She declined a XR of the right hip joint.     Primary osteoarthritis of both hands: She has severe PIP and DIP synovial thickening consistent with osteoarthritis.  CMC joint synovial thickening bilaterally.  She has complete fist formation bilaterally.  Joint protection and muscle strengthening were discussed.     Osteopenia of multiple sites: She takes a calcium and vitamin D supplement.   Other medical conditions are listed as follows:   Chronic constipation  Diverticulosis  Dyslipidemia  Essential hypertension  History of anxiety  History of gastroesophageal reflux (GERD)  History of kidney stones  RLS (restless legs syndrome)   Orders: Orders Placed This Encounter  Procedures  . Large Joint Inj: R greater trochanter   No  orders of the defined types were placed in this encounter.   Face-to-face time spent with patient was 30 minutes. Greater than 50% of time was spent in counseling and coordination of care.  Follow-Up Instructions: Return in about 6 months (around 05/18/2019) for Osteoarthritis.   Hazel Sams PA-C  I examined and evaluated the patient with Hazel Sams PA.  Patient is having pain and discomfort in her right trochanteric bursa.  She also had limited range of motion of her right hip joint.  She declined x-ray of her right hip.  She will notify us if she has persistent problem.  Her right hip joint was injected with cortisone as described above.  The plan of care was discussed as noted above.  Bo Merino, MD  Note - This record has been created using Editor, commissioning.  Chart creation errors have been sought, but may not always  have been located. Such creation errors do not reflect on  the standard of medical care.

## 2018-11-17 ENCOUNTER — Ambulatory Visit (INDEPENDENT_AMBULATORY_CARE_PROVIDER_SITE_OTHER): Payer: Medicare Other | Admitting: Rheumatology

## 2018-11-17 ENCOUNTER — Encounter: Payer: Self-pay | Admitting: Rheumatology

## 2018-11-17 VITALS — BP 109/60 | HR 68 | Resp 18 | Ht 62.0 in | Wt 145.0 lb

## 2018-11-17 DIAGNOSIS — M7061 Trochanteric bursitis, right hip: Secondary | ICD-10-CM

## 2018-11-17 DIAGNOSIS — M1612 Unilateral primary osteoarthritis, left hip: Secondary | ICD-10-CM

## 2018-11-17 DIAGNOSIS — G2581 Restless legs syndrome: Secondary | ICD-10-CM

## 2018-11-17 DIAGNOSIS — M8589 Other specified disorders of bone density and structure, multiple sites: Secondary | ICD-10-CM | POA: Diagnosis not present

## 2018-11-17 DIAGNOSIS — Z87442 Personal history of urinary calculi: Secondary | ICD-10-CM

## 2018-11-17 DIAGNOSIS — Z8719 Personal history of other diseases of the digestive system: Secondary | ICD-10-CM | POA: Diagnosis not present

## 2018-11-17 DIAGNOSIS — K5790 Diverticulosis of intestine, part unspecified, without perforation or abscess without bleeding: Secondary | ICD-10-CM | POA: Diagnosis not present

## 2018-11-17 DIAGNOSIS — I10 Essential (primary) hypertension: Secondary | ICD-10-CM | POA: Diagnosis not present

## 2018-11-17 DIAGNOSIS — E785 Hyperlipidemia, unspecified: Secondary | ICD-10-CM | POA: Diagnosis not present

## 2018-11-17 DIAGNOSIS — K5909 Other constipation: Secondary | ICD-10-CM | POA: Diagnosis not present

## 2018-11-17 DIAGNOSIS — Z8659 Personal history of other mental and behavioral disorders: Secondary | ICD-10-CM

## 2018-11-17 DIAGNOSIS — M25551 Pain in right hip: Secondary | ICD-10-CM | POA: Diagnosis not present

## 2018-11-17 DIAGNOSIS — M19042 Primary osteoarthritis, left hand: Secondary | ICD-10-CM

## 2018-11-17 DIAGNOSIS — M19041 Primary osteoarthritis, right hand: Secondary | ICD-10-CM

## 2018-11-17 DIAGNOSIS — M7062 Trochanteric bursitis, left hip: Secondary | ICD-10-CM

## 2018-11-17 MED ORDER — LIDOCAINE HCL 1 % IJ SOLN
1.0000 mL | INTRAMUSCULAR | Status: AC | PRN
  Administered 2018-11-17: 1 mL

## 2018-11-17 MED ORDER — TRIAMCINOLONE ACETONIDE 40 MG/ML IJ SUSP
40.0000 mg | INTRAMUSCULAR | Status: AC | PRN
  Administered 2018-11-17: 40 mg via INTRA_ARTICULAR

## 2019-02-24 DIAGNOSIS — M899 Disorder of bone, unspecified: Secondary | ICD-10-CM | POA: Diagnosis not present

## 2019-02-24 DIAGNOSIS — E7800 Pure hypercholesterolemia, unspecified: Secondary | ICD-10-CM | POA: Diagnosis not present

## 2019-02-24 DIAGNOSIS — F411 Generalized anxiety disorder: Secondary | ICD-10-CM | POA: Diagnosis not present

## 2019-02-24 DIAGNOSIS — K219 Gastro-esophageal reflux disease without esophagitis: Secondary | ICD-10-CM | POA: Diagnosis not present

## 2019-02-24 DIAGNOSIS — M1990 Unspecified osteoarthritis, unspecified site: Secondary | ICD-10-CM | POA: Diagnosis not present

## 2019-02-24 DIAGNOSIS — G479 Sleep disorder, unspecified: Secondary | ICD-10-CM | POA: Diagnosis not present

## 2019-02-24 DIAGNOSIS — I10 Essential (primary) hypertension: Secondary | ICD-10-CM | POA: Diagnosis not present

## 2019-02-24 DIAGNOSIS — G2581 Restless legs syndrome: Secondary | ICD-10-CM | POA: Diagnosis not present

## 2019-02-24 DIAGNOSIS — J309 Allergic rhinitis, unspecified: Secondary | ICD-10-CM | POA: Diagnosis not present

## 2019-05-04 NOTE — Progress Notes (Deleted)
Office Visit Note  Patient: Alicia [name]             Date of Birth: [birth year]           MRN: [id number]             PCP: Gaynelle Arabian, MD Referring: Gaynelle Arabian, MD Visit Date: 05/18/2019 Occupation: @GUAROCC @  Subjective:  No chief complaint on file.   History of Present Illness: Alicia [name] is a 78 y.o. female ***   Activities of Daily Living:  Patient reports morning stiffness for *** {minute/hour:19697}.   Patient {ACTIONS;DENIES/REPORTS:21021675::"Denies"} nocturnal pain.  Difficulty dressing/grooming: {ACTIONS;DENIES/REPORTS:21021675::"Denies"} Difficulty climbing stairs: {ACTIONS;DENIES/REPORTS:21021675::"Denies"} Difficulty getting out of chair: {ACTIONS;DENIES/REPORTS:21021675::"Denies"} Difficulty using hands for taps, buttons, cutlery, and/or writing: {ACTIONS;DENIES/REPORTS:21021675::"Denies"}  No Rheumatology ROS completed.   PMFS History:  Patient Active Problem List   Diagnosis Date Noted  . Essential hypertension 10/12/2018  . Dyslipidemia 10/12/2018  . History of kidney stones 10/12/2018  . History of gastroesophageal reflux (GERD) 10/12/2018  . History of anxiety 10/12/2018  . Osteopenia of multiple sites 10/12/2018  . Diverticulosis 10/12/2018  . Chronic constipation 10/12/2018  . Primary osteoarthritis of both hands 10/12/2018  . RLS (restless legs syndrome) 03/28/2016    Past Medical History:  Diagnosis Date  . Anxiety   . Bursitis   . GERD (gastroesophageal reflux disease)   . Hypercholesteremia   . Hypertension   . Insomnia   . Kidney stone   . Osteoarthritis   . Osteopenia   . Restless leg syndrome   . RLS (restless legs syndrome) 03/28/2016    Family History  Problem Relation Age of Onset  . Heart attack Mother   . Depression Mother   . Stroke Mother   . Diabetes Mother   . Heart attack Father   . COPD Sister   . COPD Brother   . COPD Brother   . COPD Sister   . Restless legs syndrome Son   . Restless legs syndrome  Son   . Restless legs syndrome Daughter    Past Surgical History:  Procedure Laterality Date  . ABDOMINAL HYSTERECTOMY    . APPENDECTOMY    . BLEPHAROPLASTY  2019  . CATARACT EXTRACTION Right 02/2010  . CHOLECYSTECTOMY    . ROTATOR CUFF REPAIR Right    Social History   Social History Narrative   Lives alone in a one story home.  Has 3 children.     Semi retired.     Worked in [place] for [employer].  Education: high school.    There is no immunization history on file for this patient.   Objective: Vital Signs: There were no vitals taken for this visit.   Physical Exam   Musculoskeletal Exam: ***  CDAI Exam: CDAI Score: - Patient Global: -; Provider Global: - Swollen: -; Tender: - Joint Exam   No joint exam has been documented for this visit   There is currently no information documented on the homunculus. Go to the Rheumatology activity and complete the homunculus joint exam.  Investigation: No additional findings.  Imaging: No results found.  Recent Labs: Lab Results  Component Value Date   WBC 9.6 10/08/2018   HGB 13.9 10/08/2018   PLT 276 10/08/2018   NA 135 10/08/2018   K 3.4 (L) 10/08/2018   CL 103 10/08/2018   CO2 24 10/08/2018   GLUCOSE 114 (H) 10/08/2018   BUN 22 10/08/2018   CREATININE 0.72 10/08/2018   BILITOT 0.6 10/08/2018  ALKPHOS 76 10/08/2018   AST 20 10/08/2018   ALT 15 10/08/2018   PROT 7.3 10/08/2018   ALBUMIN 3.8 10/08/2018   CALCIUM 9.1 10/08/2018   GFRAA >60 10/08/2018    Speciality Comments: No specialty comments available.  Procedures:  No procedures performed Allergies: Ibuprofen, Lexapro [escitalopram oxalate], Lisinopril, Mobic [meloxicam], Nabumetone, Prilosec otc [omeprazole magnesium], Zoloft [sertraline hcl], and Sulfa antibiotics   Assessment / Plan:     Visit Diagnoses: No diagnosis found.  Orders: No orders of the defined types were placed in this encounter.  No orders of the defined types were placed in this  encounter.   Face-to-face time spent with patient was *** minutes. Greater than 50% of time was spent in counseling and coordination of care.  Follow-Up Instructions: No follow-ups on file.   Earnestine Mealing, CMA  Note - This record has been created using Editor, commissioning.  Chart creation errors have been sought, but may not always  have been located. Such creation errors do not reflect on  the standard of medical care.

## 2019-05-18 ENCOUNTER — Ambulatory Visit: Payer: Self-pay | Admitting: Rheumatology

## 2019-06-14 NOTE — Progress Notes (Signed)
Office Visit Note  Patient: Alicia [name]             Date of Birth: 03/15/1941           MRN: [id number]             PCP: Gaynelle Arabian, MD Referring: Gaynelle Arabian, MD Visit Date: 06/27/2019 Occupation: @GUAROCC @  Subjective:  Other (left hip pain, requests injection. )    History of Present Illness: Alicia [name] is a 78 y.o. female with history of osteoarthritis.  She has been suffering from trochanteric bursitis.  We injected right trochanteric bursa at the last visit and she had a great response.  She states her left trochanteric bursa has been painful.  Her left hip joint is not been as painful as before.  She does have some limitation in her hands due to osteoarthritis but not much discomfort.  Denies any joint swelling.  Activities of Daily Living:  Patient reports morning stiffness for 1 hour.   Patient Denies nocturnal pain.  Difficulty dressing/grooming: Denies Difficulty climbing stairs: Denies Difficulty getting out of chair: Denies Difficulty using hands for taps, buttons, cutlery, and/or writing: Reports  Review of Systems  Constitutional: Positive for fatigue. Negative for night sweats, weight gain and weight loss.  HENT: Positive for mouth dryness. Negative for mouth sores, trouble swallowing, trouble swallowing and nose dryness.   Eyes: Negative for pain, redness, itching, visual disturbance and dryness.  Respiratory: Negative for cough, shortness of breath, wheezing and difficulty breathing.   Cardiovascular: Negative for chest pain, palpitations, hypertension, irregular heartbeat and swelling in legs/feet.  Gastrointestinal: Negative for abdominal pain, blood in stool, constipation and diarrhea.  Endocrine: Negative for increased urination.  Genitourinary: Negative for difficulty urinating, painful urination, pelvic pain and vaginal dryness.  Musculoskeletal: Positive for arthralgias, joint pain and morning stiffness. Negative for joint swelling, myalgias,  muscle weakness, muscle tenderness and myalgias.  Skin: Negative for color change, rash, hair loss, skin tightness, ulcers and sensitivity to sunlight.  Allergic/Immunologic: Negative for susceptible to infections.  Neurological: Negative for dizziness, headaches, memory loss, night sweats and weakness.  Hematological: Negative for bruising/bleeding tendency and swollen glands.  Psychiatric/Behavioral: Positive for sleep disturbance. Negative for depressed mood and confusion. The patient is not nervous/anxious.     PMFS History:  Patient Active Problem List   Diagnosis Date Noted  . Essential hypertension 10/12/2018  . Dyslipidemia 10/12/2018  . History of kidney stones 10/12/2018  . History of gastroesophageal reflux (GERD) 10/12/2018  . History of anxiety 10/12/2018  . Osteopenia of multiple sites 10/12/2018  . Diverticulosis 10/12/2018  . Chronic constipation 10/12/2018  . Primary osteoarthritis of both hands 10/12/2018  . RLS (restless legs syndrome) 03/28/2016    Past Medical History:  Diagnosis Date  . Anxiety   . Bursitis   . GERD (gastroesophageal reflux disease)   . Hypercholesteremia   . Hypertension   . Insomnia   . Kidney stone   . Osteoarthritis   . Osteopenia   . Restless leg syndrome   . RLS (restless legs syndrome) 03/28/2016    Family History  Problem Relation Age of Onset  . Heart attack Mother   . Depression Mother   . Stroke Mother   . Diabetes Mother   . Heart attack Father   . COPD Sister   . COPD Brother   . COPD Brother   . COPD Sister   . Restless legs syndrome Son   . Restless legs syndrome Son   .  Restless legs syndrome Daughter    Past Surgical History:  Procedure Laterality Date  . ABDOMINAL HYSTERECTOMY    . APPENDECTOMY    . BLEPHAROPLASTY  2019  . CATARACT EXTRACTION Right 02/2010  . CHOLECYSTECTOMY    . ROTATOR CUFF REPAIR Right    Social History   Social History Narrative   Lives alone in a one story home.  Has 3 children.      Semi retired.     Worked in [place] for [employer].  Education: high school.    There is no immunization history on file for this patient.   Objective: Vital Signs: BP (!) 165/75 (BP Location: Left Arm, Patient Position: Sitting, Cuff Size: Normal)   Pulse (!) 58   Resp 13   Ht 5\' 2"  (1.575 m)   Wt 154 lb (69.9 kg)   BMI 28.17 kg/m    Physical Exam Vitals signs and nursing note reviewed.  Constitutional:      Appearance: She is well-developed.  HENT:     Head: Normocephalic and atraumatic.  Eyes:     Conjunctiva/sclera: Conjunctivae normal.  Neck:     Musculoskeletal: Normal range of motion.  Cardiovascular:     Rate and Rhythm: Normal rate and regular rhythm.     Heart sounds: Normal heart sounds.  Pulmonary:     Effort: Pulmonary effort is normal.     Breath sounds: Normal breath sounds.  Abdominal:     General: Bowel sounds are normal.     Palpations: Abdomen is soft.  Lymphadenopathy:     Cervical: No cervical adenopathy.  Skin:    General: Skin is warm and dry.     Capillary Refill: Capillary refill takes less than 2 seconds.  Neurological:     Mental Status: She is alert and oriented to person, place, and time.  Psychiatric:        Behavior: Behavior normal.      Musculoskeletal Exam: C-spine thoracic and lumbar spine with good range of motion.  Shoulder joints and elbow joints with good range of motion.  She has DIP and PIP thickening consistent with osteoarthritis.  She has limited range of motion of bilateral hip joints with tenderness over left trochanteric bursa.  Knee joints and ankle joints with good range of motion.  CDAI Exam: CDAI Score: - Patient Global: -; Provider Global: - Swollen: -; Tender: - Joint Exam   No joint exam has been documented for this visit   There is currently no information documented on the homunculus. Go to the Rheumatology activity and complete the homunculus joint exam.  Investigation: No additional findings.   Imaging: No results found.  Recent Labs: Lab Results  Component Value Date   WBC 9.6 10/08/2018   HGB 13.9 10/08/2018   PLT 276 10/08/2018   NA 135 10/08/2018   K 3.4 (L) 10/08/2018   CL 103 10/08/2018   CO2 24 10/08/2018   GLUCOSE 114 (H) 10/08/2018   BUN 22 10/08/2018   CREATININE 0.72 10/08/2018   BILITOT 0.6 10/08/2018   ALKPHOS 76 10/08/2018   AST 20 10/08/2018   ALT 15 10/08/2018   PROT 7.3 10/08/2018   ALBUMIN 3.8 10/08/2018   CALCIUM 9.1 10/08/2018   GFRAA >60 10/08/2018    Speciality Comments: No specialty comments available.  Procedures:  Large Joint Inj: L greater trochanter on 06/27/2019 12:25 PM Indications: pain Details: 27 G 1.5 in needle, lateral approach  Arthrogram: No  Medications: 40 mg triamcinolone acetonide 40 MG/ML;  1.5 mL lidocaine 1 % Aspirate: 0 mL Outcome: tolerated well, no immediate complications Procedure, treatment alternatives, risks and benefits explained, specific risks discussed. Consent was given by the patient. Immediately prior to procedure a time out was called to verify the correct patient, procedure, equipment, support staff and site/side marked as required. Patient was prepped and draped in the usual sterile fashion.     Allergies: Ibuprofen, Lexapro [escitalopram oxalate], Lisinopril, Mobic [meloxicam], Nabumetone, Prilosec otc [omeprazole magnesium], Zoloft [sertraline hcl], and Sulfa antibiotics   Assessment / Plan:     Visit Diagnoses: Trochanteric bursitis of both hips she has been having trochanteric bursitis in her bilateral hip joints.  The left trochanteric bursa has been more painful than the right.  She had good response to right trochanteric bursa injection.  We will inject her left trochanteric bursa today.  Informed consent was obtained and left trochanteric bursa was injected as described above.  She does have a handout on IT band exercises will she will practice at home.  Primary osteoarthritis of left hip -she  has limited range of motion of bilateral hip joints.  XR-mild, autoimmune work up negative  Primary osteoarthritis of both hands-joint protection muscle strengthening was discussed.  Osteopenia of multiple sites-she will discuss repeat DEXA with her PCP.  Other medical problems are listed as follows:  Chronic constipation  RLS (restless legs syndrome)  History of gastroesophageal reflux (GERD)  Dyslipidemia  History of anxiety  Diverticulosis  Essential hypertension-patient states that she took her blood pressure medication this morning.  Have advised her to monitor blood pressure closely.  History of kidney stones  Orders: No orders of the defined types were placed in this encounter.  No orders of the defined types were placed in this encounter.  .  Follow-Up Instructions: No follow-ups on file.   Bo Merino, MD  Note - This record has been created using Editor, commissioning.  Chart creation errors have been sought, but may not always  have been located. Such creation errors do not reflect on  the standard of medical care.

## 2019-06-15 DIAGNOSIS — Z23 Encounter for immunization: Secondary | ICD-10-CM | POA: Diagnosis not present

## 2019-06-27 ENCOUNTER — Other Ambulatory Visit: Payer: Self-pay

## 2019-06-27 ENCOUNTER — Ambulatory Visit (INDEPENDENT_AMBULATORY_CARE_PROVIDER_SITE_OTHER): Payer: Medicare Other | Admitting: Rheumatology

## 2019-06-27 ENCOUNTER — Encounter: Payer: Self-pay | Admitting: Rheumatology

## 2019-06-27 VITALS — BP 165/75 | HR 58 | Resp 13 | Ht 62.0 in | Wt 154.0 lb

## 2019-06-27 DIAGNOSIS — M7062 Trochanteric bursitis, left hip: Secondary | ICD-10-CM | POA: Diagnosis not present

## 2019-06-27 DIAGNOSIS — Z87442 Personal history of urinary calculi: Secondary | ICD-10-CM

## 2019-06-27 DIAGNOSIS — M1612 Unilateral primary osteoarthritis, left hip: Secondary | ICD-10-CM

## 2019-06-27 DIAGNOSIS — E785 Hyperlipidemia, unspecified: Secondary | ICD-10-CM

## 2019-06-27 DIAGNOSIS — M8589 Other specified disorders of bone density and structure, multiple sites: Secondary | ICD-10-CM

## 2019-06-27 DIAGNOSIS — I10 Essential (primary) hypertension: Secondary | ICD-10-CM

## 2019-06-27 DIAGNOSIS — Z8719 Personal history of other diseases of the digestive system: Secondary | ICD-10-CM | POA: Diagnosis not present

## 2019-06-27 DIAGNOSIS — M7061 Trochanteric bursitis, right hip: Secondary | ICD-10-CM | POA: Diagnosis not present

## 2019-06-27 DIAGNOSIS — K5909 Other constipation: Secondary | ICD-10-CM

## 2019-06-27 DIAGNOSIS — G2581 Restless legs syndrome: Secondary | ICD-10-CM

## 2019-06-27 DIAGNOSIS — Z8659 Personal history of other mental and behavioral disorders: Secondary | ICD-10-CM | POA: Diagnosis not present

## 2019-06-27 DIAGNOSIS — M19042 Primary osteoarthritis, left hand: Secondary | ICD-10-CM

## 2019-06-27 DIAGNOSIS — K5790 Diverticulosis of intestine, part unspecified, without perforation or abscess without bleeding: Secondary | ICD-10-CM

## 2019-06-27 DIAGNOSIS — M19041 Primary osteoarthritis, right hand: Secondary | ICD-10-CM

## 2019-06-27 MED ORDER — LIDOCAINE HCL 1 % IJ SOLN
1.5000 mL | INTRAMUSCULAR | Status: AC | PRN
  Administered 2019-06-27: 1.5 mL

## 2019-06-27 MED ORDER — TRIAMCINOLONE ACETONIDE 40 MG/ML IJ SUSP
40.0000 mg | INTRAMUSCULAR | Status: AC | PRN
  Administered 2019-06-27: 40 mg via INTRA_ARTICULAR

## 2019-07-21 DIAGNOSIS — E119 Type 2 diabetes mellitus without complications: Secondary | ICD-10-CM | POA: Diagnosis not present

## 2019-07-21 DIAGNOSIS — H524 Presbyopia: Secondary | ICD-10-CM | POA: Diagnosis not present

## 2019-07-21 DIAGNOSIS — H26493 Other secondary cataract, bilateral: Secondary | ICD-10-CM | POA: Diagnosis not present

## 2019-07-21 DIAGNOSIS — H5211 Myopia, right eye: Secondary | ICD-10-CM | POA: Diagnosis not present

## 2019-07-21 DIAGNOSIS — H52223 Regular astigmatism, bilateral: Secondary | ICD-10-CM | POA: Diagnosis not present

## 2019-10-28 DIAGNOSIS — I10 Essential (primary) hypertension: Secondary | ICD-10-CM | POA: Diagnosis not present

## 2019-11-18 DIAGNOSIS — I10 Essential (primary) hypertension: Secondary | ICD-10-CM | POA: Diagnosis not present

## 2019-12-19 NOTE — Progress Notes (Signed)
Office Visit Note  Patient: Alicia [name]             Date of Birth: May 19, 1941           MRN: [id number]             PCP: Gaynelle Arabian, MD Referring: Gaynelle Arabian, MD Visit Date: 12/26/2019 Occupation: @GUAROCC @  Subjective:  Trochanteric bursitis bilaterally   History of Present Illness: Alicia [name] is a 79 y.o. female with history of trochanteric bursitis and osteoarthritis.  Patient presents today with trochanter bursitis bilaterally.  She has been experiencing significant discomfort for the past 3 weeks.  She has had difficulty lying on her sides at night due to the discomfort.  She is also having difficulty walking prolonged distances due to the discomfort.  She requested cortisone injections bilaterally.  She denies any other joint pain or joint swelling at this time.  Activities of Daily Living:  Patient reports morning stiffness for 30 minutes.   Patient Reports nocturnal pain.  Difficulty dressing/grooming: Denies Difficulty climbing stairs: Denies Difficulty getting out of chair: Denies Difficulty using hands for taps, buttons, cutlery, and/or writing: Reports  Review of Systems  Constitutional: Negative for fatigue.  HENT: Positive for mouth dryness. Negative for mouth sores and nose dryness.   Eyes: Positive for dryness. Negative for pain and visual disturbance.  Respiratory: Negative for cough, hemoptysis and difficulty breathing.   Cardiovascular: Positive for swelling in legs/feet. Negative for chest pain, palpitations and hypertension.  Gastrointestinal: Negative for blood in stool, constipation and diarrhea.  Endocrine: Negative for increased urination.  Genitourinary: Negative for difficulty urinating and painful urination.  Musculoskeletal: Positive for arthralgias, joint pain, morning stiffness and muscle tenderness. Negative for joint swelling, myalgias, muscle weakness and myalgias.  Skin: Negative for color change, pallor, rash, hair loss,  nodules/bumps, skin tightness, ulcers and sensitivity to sunlight.  Allergic/Immunologic: Negative for susceptible to infections.  Neurological: Negative for dizziness, numbness, headaches and weakness.  Hematological: Negative for bruising/bleeding tendency and swollen glands.  Psychiatric/Behavioral: Positive for sleep disturbance. Negative for depressed mood. The patient is not nervous/anxious.     PMFS History:  Patient Active Problem List   Diagnosis Date Noted  . Essential hypertension 10/12/2018  . Dyslipidemia 10/12/2018  . History of kidney stones 10/12/2018  . History of gastroesophageal reflux (GERD) 10/12/2018  . History of anxiety 10/12/2018  . Osteopenia of multiple sites 10/12/2018  . Diverticulosis 10/12/2018  . Chronic constipation 10/12/2018  . Primary osteoarthritis of both hands 10/12/2018  . RLS (restless legs syndrome) 03/28/2016    Past Medical History:  Diagnosis Date  . Anxiety   . Bursitis   . GERD (gastroesophageal reflux disease)   . Hypercholesteremia   . Hypertension   . Insomnia   . Kidney stone   . Osteoarthritis   . Osteopenia   . Restless leg syndrome   . RLS (restless legs syndrome) 03/28/2016    Family History  Problem Relation Age of Onset  . Heart attack Mother   . Depression Mother   . Stroke Mother   . Diabetes Mother   . Heart attack Father   . COPD Sister   . COPD Brother   . COPD Brother   . COPD Sister   . Restless legs syndrome Son   . Restless legs syndrome Son   . Restless legs syndrome Daughter    Past Surgical History:  Procedure Laterality Date  . ABDOMINAL HYSTERECTOMY    . APPENDECTOMY    .  BLEPHAROPLASTY  2019  . CATARACT EXTRACTION Right 02/2010  . CHOLECYSTECTOMY    . ROTATOR CUFF REPAIR Right    Social History   Social History Narrative   Lives alone in a one story home.  Has 3 children.     Semi retired.     Worked in [place] for [employer].  Education: high school.    There is no immunization history  on file for this patient.   Objective: Vital Signs: BP 116/63 (BP Location: Left Arm, Patient Position: Sitting, Cuff Size: Normal)   Pulse 62   Resp 16   Ht 5\' 2"  (1.575 m)   Wt 154 lb 3.2 oz (69.9 kg)   BMI 28.20 kg/m    Physical Exam Vitals and nursing note reviewed.  Constitutional:      Appearance: She is well-developed.  HENT:     Head: Normocephalic and atraumatic.  Eyes:     Conjunctiva/sclera: Conjunctivae normal.  Pulmonary:     Effort: Pulmonary effort is normal.  Abdominal:     General: Bowel sounds are normal.     Palpations: Abdomen is soft.  Musculoskeletal:     Cervical back: Normal range of motion.  Lymphadenopathy:     Cervical: No cervical adenopathy.  Skin:    General: Skin is warm and dry.     Capillary Refill: Capillary refill takes less than 2 seconds.  Neurological:     Mental Status: She is alert and oriented to person, place, and time.  Psychiatric:        Behavior: Behavior normal.      Musculoskeletal Exam: C-spine very limited range of motion.  Mild thoracic kyphosis noted.  Shoulder joints, elbow joints, wrist joints, MCPs, PIPs, DIPs good range of motion with no synovitis.  She has severe PIP and DIP thickening consistent with osteoarthritis worse in the right hand.  She has 95% fist formation bilaterally.  Left hip has limited range of motion with discomfort.  Right hip has full range of motion with no discomfort.  She is tenderness over bilateral trochanteric bursa.  Knee joints have good range of motion with no warmth or effusion.  Ankle joints have no tenderness and have good range of motion.  She has pedal edema bilaterally.  CDAI Exam: CDAI Score: - Patient Global: -; Provider Global: - Swollen: -; Tender: - Joint Exam 12/26/2019   No joint exam has been documented for this visit   There is currently no information documented on the homunculus. Go to the Rheumatology activity and complete the homunculus joint exam.  Investigation:  No additional findings.  Imaging: No results found.  Recent Labs: Lab Results  Component Value Date   WBC 9.6 10/08/2018   HGB 13.9 10/08/2018   PLT 276 10/08/2018   NA 135 10/08/2018   K 3.4 (L) 10/08/2018   CL 103 10/08/2018   CO2 24 10/08/2018   GLUCOSE 114 (H) 10/08/2018   BUN 22 10/08/2018   CREATININE 0.72 10/08/2018   BILITOT 0.6 10/08/2018   ALKPHOS 76 10/08/2018   AST 20 10/08/2018   ALT 15 10/08/2018   PROT 7.3 10/08/2018   ALBUMIN 3.8 10/08/2018   CALCIUM 9.1 10/08/2018   GFRAA >60 10/08/2018    Speciality Comments: No specialty comments available.  Procedures:  Large Joint Inj: bilateral greater trochanter on 12/26/2019 12:00 PM Indications: pain Details: 27 G 1.5 in needle, lateral approach  Arthrogram: No  Medications (Right): 1.5 mL lidocaine 1 %; 40 mg triamcinolone acetonide 40 MG/ML  Aspirate (Right): 0 mL Medications (Left): 1.5 mL lidocaine 1 %; 40 mg triamcinolone acetonide 40 MG/ML Aspirate (Left): 0 mL Outcome: tolerated well, no immediate complications Procedure, treatment alternatives, risks and benefits explained, specific risks discussed. Consent was given by the patient. Immediately prior to procedure a time out was called to verify the correct patient, procedure, equipment, support staff and site/side marked as required. Patient was prepped and draped in the usual sterile fashion.     Allergies: Ibuprofen, Lexapro [escitalopram oxalate], Lisinopril, Mobic [meloxicam], Nabumetone, Prilosec otc [omeprazole magnesium], Zoloft [sertraline hcl], and Sulfa antibiotics   Assessment / Plan:     Visit Diagnoses: Trochanteric bursitis of both hips: She presents today with trochanter bursitis bilaterally.  She has been experiencing discomfort for the past 3 weeks.  Her discomfort has been severe at times and she has been experiencing nocturnal pain.  She requested cortisone injections bilaterally.  She tolerated the procedure well.  The procedure note  was completed above.  Aftercare was discussed.  She was given a handout of exercises to perform.  We also discussed physical therapy as a treatment option but she declined at this time.  She was advised to notify us if she would like to proceed with physical therapy and we will place a referral.  She will follow-up in the office in 6 months.  Primary osteoarthritis of left hip - She has limited range of motion of the left hip with discomfort.Marland Kitchen XR-mild, autoimmune work up negative.   Primary osteoarthritis of both hands: She has PIP and DIP thickening consistent with osteoarthritis of both hands.  She has 95% fist formation bilaterally.  No synovitis was noted.  Joint protection and muscle strengthening were discussed.  Osteopenia of multiple sites - She will discuss repeat DEXA with her PCP.  Other medical conditions are listed as follows:  Chronic constipation  Dyslipidemia  History of gastroesophageal reflux (GERD)  RLS (restless legs syndrome)  Essential hypertension  History of anxiety  Diverticulosis  History of kidney stones  Orders: Orders Placed This Encounter  Procedures  . Large Joint Inj   No orders of the defined types were placed in this encounter.     Follow-Up Instructions: Return in about 6 months (around 06/27/2020) for Trochanteric bursitis , Osteoarthritis.   Ofilia Neas, PA-C   I examined and evaluated the patient with Hazel Sams PA.  Patient continues to have some generalized pain and stiffness from osteoarthritis.  She has tenderness over bilateral trochanteric bursa on examination consistent with trochanteric bursitis.  After different treatment options were discussed bilateral trochanteric bursae were injected  as described above.  The plan of care was discussed as noted above.  Bo Merino, MD Note - This record has been created using Editor, commissioning.  Chart creation errors have been sought, but may not always  have been located. Such  creation errors do not reflect on  the standard of medical care.

## 2019-12-26 ENCOUNTER — Encounter (INDEPENDENT_AMBULATORY_CARE_PROVIDER_SITE_OTHER): Payer: Self-pay

## 2019-12-26 ENCOUNTER — Ambulatory Visit (INDEPENDENT_AMBULATORY_CARE_PROVIDER_SITE_OTHER): Payer: Medicare Other | Admitting: Rheumatology

## 2019-12-26 ENCOUNTER — Encounter: Payer: Self-pay | Admitting: Rheumatology

## 2019-12-26 ENCOUNTER — Other Ambulatory Visit: Payer: Self-pay

## 2019-12-26 VITALS — BP 116/63 | HR 62 | Resp 16 | Ht 62.0 in | Wt 154.2 lb

## 2019-12-26 DIAGNOSIS — M7062 Trochanteric bursitis, left hip: Secondary | ICD-10-CM

## 2019-12-26 DIAGNOSIS — Z8659 Personal history of other mental and behavioral disorders: Secondary | ICD-10-CM

## 2019-12-26 DIAGNOSIS — I10 Essential (primary) hypertension: Secondary | ICD-10-CM | POA: Diagnosis not present

## 2019-12-26 DIAGNOSIS — M19042 Primary osteoarthritis, left hand: Secondary | ICD-10-CM

## 2019-12-26 DIAGNOSIS — G2581 Restless legs syndrome: Secondary | ICD-10-CM

## 2019-12-26 DIAGNOSIS — K5909 Other constipation: Secondary | ICD-10-CM | POA: Diagnosis not present

## 2019-12-26 DIAGNOSIS — M7061 Trochanteric bursitis, right hip: Secondary | ICD-10-CM

## 2019-12-26 DIAGNOSIS — Z8719 Personal history of other diseases of the digestive system: Secondary | ICD-10-CM

## 2019-12-26 DIAGNOSIS — M19041 Primary osteoarthritis, right hand: Secondary | ICD-10-CM

## 2019-12-26 DIAGNOSIS — E785 Hyperlipidemia, unspecified: Secondary | ICD-10-CM

## 2019-12-26 DIAGNOSIS — Z87442 Personal history of urinary calculi: Secondary | ICD-10-CM

## 2019-12-26 DIAGNOSIS — K5790 Diverticulosis of intestine, part unspecified, without perforation or abscess without bleeding: Secondary | ICD-10-CM | POA: Diagnosis not present

## 2019-12-26 DIAGNOSIS — M8589 Other specified disorders of bone density and structure, multiple sites: Secondary | ICD-10-CM | POA: Diagnosis not present

## 2019-12-26 DIAGNOSIS — M1612 Unilateral primary osteoarthritis, left hip: Secondary | ICD-10-CM

## 2019-12-26 NOTE — Patient Instructions (Signed)
Iliotibial Band Syndrome Rehab Ask your health care provider which exercises are safe for you. Do exercises exactly as told by your health care provider and adjust them as directed. It is normal to feel mild stretching, pulling, tightness, or discomfort as you do these exercises. Stop right away if you feel sudden pain or your pain gets significantly worse. Do not begin these exercises until told by your health care provider. Stretching and range-of-motion exercises These exercises warm up your muscles and joints and improve the movement and flexibility of your hip and pelvis. Quadriceps stretch, prone  1. Lie on your abdomen on a firm surface, such as a bed or padded floor (prone position). 2. Bend your left / right knee and reach back to hold your ankle or pant leg. If you cannot reach your ankle or pant leg, loop a belt around your foot and grab the belt instead. 3. Gently pull your heel toward your buttocks. Your knee should not slide out to the side. You should feel a stretch in the front of your thigh and knee (quadriceps). 4. Hold this position for __________ seconds. Repeat __________ times. Complete this exercise __________ times a day. Iliotibial band stretch An iliotibial band is a strong band of muscle tissue that runs from the outer side of your hip to the outer side of your thigh and knee. 1. Lie on your side with your left / right leg in the top position. 2. Bend both of your knees and grab your left / right ankle. Stretch out your bottom arm to help you balance. 3. Slowly bring your top knee back so your thigh goes behind your trunk. 4. Slowly lower your top leg toward the floor until you feel a gentle stretch on the outside of your left / right hip and thigh. If you do not feel a stretch and your knee will not fall farther, place the heel of your other foot on top of your knee and pull your knee down toward the floor with your foot. 5. Hold this position for __________  seconds. Repeat __________ times. Complete this exercise __________ times a day. Strengthening exercises These exercises build strength and endurance in your hip and pelvis. Endurance is the ability to use your muscles for a long time, even after they get tired. Straight leg raises, side-lying This exercise strengthens the muscles that rotate the leg at the hip and move it away from your body (hip abductors). 1. Lie on your side with your left / right leg in the top position. Lie so your head, shoulder, hip, and knee line up. You may bend your bottom knee to help you balance. 2. Roll your hips slightly forward so your hips are stacked directly over each other and your left / right knee is facing forward. 3. Tense the muscles in your outer thigh and lift your top leg 4-6 inches (10-15 cm). 4. Hold this position for __________ seconds. 5. Slowly return to the starting position. Let your muscles relax completely before doing another repetition. Repeat __________ times. Complete this exercise __________ times a day. Leg raises, prone This exercise strengthens the muscles that move the hips (hip extensors). 1. Lie on your abdomen on your bed or a firm surface. You can put a pillow under your hips if that is more comfortable for your lower back. 2. Bend your left / right knee so your foot is straight up in the air. 3. Squeeze your buttocks muscles and lift your left / right thigh   off the bed. Do not let your back arch. 4. Tense your thigh muscle as hard as you can without increasing any knee pain. 5. Hold this position for __________ seconds. 6. Slowly lower your leg to the starting position and allow it to relax completely. Repeat __________ times. Complete this exercise __________ times a day. Hip hike 1. Stand sideways on a bottom step. Stand on your left / right leg with your other foot unsupported next to the step. You can hold on to the railing or wall for balance if needed. 2. Keep your knees  straight and your torso square. Then lift your left / right hip up toward the ceiling. 3. Slowly let your left / right hip lower toward the floor, past the starting position. Your foot should get closer to the floor. Do not lean or bend your knees. Repeat __________ times. Complete this exercise __________ times a day. This information is not intended to replace advice given to you by your health care provider. Make sure you discuss any questions you have with your health care provider. Document Revised: 01/13/2019 Document Reviewed: 07/14/2018 Elsevier Patient Education  2020 [employer] Inc[employer]   Hip Bursitis Rehab Ask your health care provider which exercises are safe for you. Do exercises exactly as told by your health care provider and adjust them as directed. It is normal to feel mild stretching, pulling, tightness, or discomfort as you do these exercises. Stop right away if you feel sudden pain or your pain gets worse. Do not begin these exercises until told by your health care provider. Stretching exercise This exercise warms up your muscles and joints and improves the movement and flexibility of your hip. This exercise also helps to relieve pain and stiffness. Iliotibial band stretch An iliotibial band is a strong band of muscle tissue that runs from the outer side of your hip to the outer side of your thigh and knee. 1. Lie on your side with your left / right leg in the top position. 2. Bend your left / right knee and grab your ankle. Stretch out your bottom arm to help you balance. 3. Slowly bring your knee back so your thigh is behind your body. 4. Slowly lower your knee toward the floor until you feel a gentle stretch on the outside of your left / right thigh. If you do not feel a stretch and your knee will not fall farther, place the heel of your other foot on top of your knee and pull your knee down toward the floor with your foot. 5. Hold this position for __________ seconds. 6. Slowly  return to the starting position. Repeat __________ times. Complete this exercise __________ times a day. Strengthening exercises These exercises build strength and endurance in your hip and pelvis. Endurance is the ability to use your muscles for a long time, even after they get tired. Bridge This exercise strengthens the muscles that move your thigh backward (hip extensors). 1. Lie on your back on a firm surface with your knees bent and your feet flat on the floor. 2. Tighten your buttocks muscles and lift your buttocks off the floor until your trunk is level with your thighs. ? Do not arch your back. ? You should feel the muscles working in your buttocks and the back of your thighs. If you do not feel these muscles, slide your feet 1-2 inches (2.5-5 cm) farther away from your buttocks. ? If this exercise is too easy, try doing it with your arms crossed   over your chest. 3. Hold this position for __________ seconds. 4. Slowly lower your hips to the starting position. 5. Let your muscles relax completely after each repetition. Repeat __________ times. Complete this exercise __________ times a day. Squats This exercise strengthens the muscles in front of your thigh and knee (quadriceps). 1. Stand in front of a table, with your feet and knees pointing straight ahead. You may rest your hands on the table for balance but not for support. 2. Slowly bend your knees and lower your hips like you are going to sit in a chair. ? Keep your weight over your heels, not over your toes. ? Keep your lower legs upright so they are parallel with the table legs. ? Do not let your hips go lower than your knees. ? Do not bend lower than told by your health care provider. ? If your hip pain increases, do not bend as low. 3. Hold the squat position for __________ seconds. 4. Slowly push with your legs to return to standing. Do not use your hands to pull yourself to standing. Repeat __________ times. Complete this  exercise __________ times a day. Hip hike 1. Stand sideways on a bottom step. Stand on your left / right leg with your other foot unsupported next to the step. You can hold on to the railing or wall for balance if needed. 2. Keep your knees straight and your torso square. Then lift your left / right hip up toward the ceiling. 3. Hold this position for __________ seconds. 4. Slowly let your left / right hip lower toward the floor, past the starting position. Your foot should get closer to the floor. Do not lean or bend your knees. Repeat __________ times. Complete this exercise __________ times a day. Single leg stand 1. Without shoes, stand near a railing or in a doorway. You may hold on to the railing or door frame as needed for balance. 2. Squeeze your left / right buttock muscles, then lift up your other foot. ? Do not let your left / right hip push out to the side. ? It is helpful to stand in front of a mirror for this exercise so you can watch your hip. 3. Hold this position for __________ seconds. Repeat __________ times. Complete this exercise __________ times a day. This information is not intended to replace advice given to you by your health care provider. Make sure you discuss any questions you have with your health care provider. Document Revised: 01/17/2019 Document Reviewed: 01/17/2019 Elsevier Patient Education  2020 Elsevier Inc.  

## 2020-03-06 DIAGNOSIS — R519 Headache, unspecified: Secondary | ICD-10-CM | POA: Diagnosis not present

## 2020-03-06 DIAGNOSIS — R42 Dizziness and giddiness: Secondary | ICD-10-CM | POA: Diagnosis not present

## 2020-06-15 NOTE — Progress Notes (Deleted)
Office Visit Note  Patient: Alicia [name]             Date of Birth: [birth year]           MRN: [id number]             PCP: Gaynelle Arabian, MD Referring: Gaynelle Arabian, MD Visit Date: 06/27/2020 Occupation: @GUAROCC @  Subjective:  No chief complaint on file.   History of Present Illness: Alicia [name] is a 79 y.o. female ***   Activities of Daily Living:  Patient reports morning stiffness for *** {minute/hour:19697}.   Patient {ACTIONS;DENIES/REPORTS:21021675::"Denies"} nocturnal pain.  Difficulty dressing/grooming: {ACTIONS;DENIES/REPORTS:21021675::"Denies"} Difficulty climbing stairs: {ACTIONS;DENIES/REPORTS:21021675::"Denies"} Difficulty getting out of chair: {ACTIONS;DENIES/REPORTS:21021675::"Denies"} Difficulty using hands for taps, buttons, cutlery, and/or writing: {ACTIONS;DENIES/REPORTS:21021675::"Denies"}  No Rheumatology ROS completed.   PMFS History:  Patient Active Problem List   Diagnosis Date Noted  . Essential hypertension 10/12/2018  . Dyslipidemia 10/12/2018  . History of kidney stones 10/12/2018  . History of gastroesophageal reflux (GERD) 10/12/2018  . History of anxiety 10/12/2018  . Osteopenia of multiple sites 10/12/2018  . Diverticulosis 10/12/2018  . Chronic constipation 10/12/2018  . Primary osteoarthritis of both hands 10/12/2018  . RLS (restless legs syndrome) 03/28/2016    Past Medical History:  Diagnosis Date  . Anxiety   . Bursitis   . GERD (gastroesophageal reflux disease)   . Hypercholesteremia   . Hypertension   . Insomnia   . Kidney stone   . Osteoarthritis   . Osteopenia   . Restless leg syndrome   . RLS (restless legs syndrome) 03/28/2016    Family History  Problem Relation Age of Onset  . Heart attack Mother   . Depression Mother   . Stroke Mother   . Diabetes Mother   . Heart attack Father   . COPD Sister   . COPD Brother   . COPD Brother   . COPD Sister   . Restless legs syndrome Son   . Restless legs syndrome  Son   . Restless legs syndrome Daughter    Past Surgical History:  Procedure Laterality Date  . ABDOMINAL HYSTERECTOMY    . APPENDECTOMY    . BLEPHAROPLASTY  2019  . CATARACT EXTRACTION Right 02/2010  . CHOLECYSTECTOMY    . ROTATOR CUFF REPAIR Right    Social History   Social History Narrative   Lives alone in a one story home.  Has 3 children.     Semi retired.     Worked in [place] for [employer].  Education: high school.    There is no immunization history on file for this patient.   Objective: Vital Signs: There were no vitals taken for this visit.   Physical Exam   Musculoskeletal Exam: ***  CDAI Exam: CDAI Score: -- Patient Global: --; Provider Global: -- Swollen: --; Tender: -- Joint Exam 06/27/2020   No joint exam has been documented for this visit   There is currently no information documented on the homunculus. Go to the Rheumatology activity and complete the homunculus joint exam.  Investigation: No additional findings.  Imaging: No results found.  Recent Labs: Lab Results  Component Value Date   WBC 9.6 10/08/2018   HGB 13.9 10/08/2018   PLT 276 10/08/2018   NA 135 10/08/2018   K 3.4 (L) 10/08/2018   CL 103 10/08/2018   CO2 24 10/08/2018   GLUCOSE 114 (H) 10/08/2018   BUN 22 10/08/2018   CREATININE 0.72 10/08/2018   BILITOT 0.6 10/08/2018  ALKPHOS 76 10/08/2018   AST 20 10/08/2018   ALT 15 10/08/2018   PROT 7.3 10/08/2018   ALBUMIN 3.8 10/08/2018   CALCIUM 9.1 10/08/2018   GFRAA >60 10/08/2018    Speciality Comments: No specialty comments available.  Procedures:  No procedures performed Allergies: Ibuprofen, Lexapro [escitalopram oxalate], Lisinopril, Mobic [meloxicam], Nabumetone, Prilosec otc [omeprazole magnesium], Zoloft [sertraline hcl], and Sulfa antibiotics   Assessment / Plan:     Visit Diagnoses: No diagnosis found.  Orders: No orders of the defined types were placed in this encounter.  No orders of the defined types were  placed in this encounter.   Face-to-face time spent with patient was *** minutes. Greater than 50% of time was spent in counseling and coordination of care.  Follow-Up Instructions: No follow-ups on file.   Ofilia Neas, PA-C  Note - This record has been created using Dragon software.  Chart creation errors have been sought, but may not always  have been located. Such creation errors do not reflect on  the standard of medical care.

## 2020-06-27 ENCOUNTER — Ambulatory Visit: Payer: Medicare Other | Admitting: Rheumatology

## 2020-06-28 NOTE — Progress Notes (Addendum)
Office Visit Note  Patient: Alicia [name]             Date of Birth: 26-Oct-1940           MRN: [id number]             PCP: Gaynelle Arabian, MD Referring: Gaynelle Arabian, MD Visit Date: 07/03/2020 Occupation: @GUAROCC @  Subjective:  Bilateral Hip Pain   History of Present Illness: Alicia [name] is a 79 y.o. female with history of osteoarthritis.  She states she continues to have some discomfort in her hands and hip joints.  She states the discomfort is mostly in bilateral trochanteric bursa which radiates down into her legs.  She is having difficulty sleeping on her sides.  Left trochanteric bursitis more painful than the right.  She has been getting DEXA scan through her PCP.  Activities of Daily Living:  Patient reports morning stiffness for 1 hour.   Patient Reports nocturnal pain.  Difficulty dressing/grooming: Denies Difficulty climbing stairs: Denies Difficulty getting out of chair: Denies Difficulty using hands for taps, buttons, cutlery, and/or writing: Reports  Review of Systems  Constitutional: Positive for fatigue.  HENT: Positive for mouth dryness. Negative for mouth sores and nose dryness.   Eyes: Positive for dryness. Negative for pain and visual disturbance.  Respiratory: Positive for cough. Negative for hemoptysis, shortness of breath and difficulty breathing.        Chronic cough  Cardiovascular: Negative for chest pain, palpitations and swelling in legs/feet.  Gastrointestinal: Negative for abdominal pain, blood in stool, constipation and diarrhea.  Endocrine: Negative for increased urination.  Genitourinary: Negative for painful urination.  Musculoskeletal: Positive for arthralgias, joint pain, joint swelling, myalgias, morning stiffness, muscle tenderness and myalgias. Negative for muscle weakness.  Skin: Negative for color change, rash and redness.  Allergic/Immunologic: Negative for susceptible to infections.  Neurological: Positive for dizziness. Negative  for headaches, memory loss and weakness.  Hematological: Negative for swollen glands.  Psychiatric/Behavioral: Negative for depressed mood and confusion. The patient is not nervous/anxious.     PMFS History:  Patient Active Problem List   Diagnosis Date Noted  . Essential hypertension 10/12/2018  . Dyslipidemia 10/12/2018  . History of kidney stones 10/12/2018  . History of gastroesophageal reflux (GERD) 10/12/2018  . History of anxiety 10/12/2018  . Osteopenia of multiple sites 10/12/2018  . Diverticulosis 10/12/2018  . Chronic constipation 10/12/2018  . Primary osteoarthritis of both hands 10/12/2018  . RLS (restless legs syndrome) 03/28/2016    Past Medical History:  Diagnosis Date  . Anxiety   . Bursitis   . GERD (gastroesophageal reflux disease)   . Hypercholesteremia   . Hypertension   . Insomnia   . Kidney stone   . Osteoarthritis   . Osteopenia   . Restless leg syndrome   . RLS (restless legs syndrome) 03/28/2016    Family History  Problem Relation Age of Onset  . Heart attack Mother   . Depression Mother   . Stroke Mother   . Diabetes Mother   . Heart attack Father   . COPD Sister   . COPD Brother   . COPD Brother   . COPD Sister   . Restless legs syndrome Son   . Restless legs syndrome Son   . Restless legs syndrome Daughter    Past Surgical History:  Procedure Laterality Date  . ABDOMINAL HYSTERECTOMY    . APPENDECTOMY    . BLEPHAROPLASTY  2019  . CATARACT EXTRACTION Right 02/2010  .  CHOLECYSTECTOMY    . ROTATOR CUFF REPAIR Right    Social History   Social History Narrative   Lives alone in a one story home.  Has 3 children.     Semi retired.     Worked in [place] for [employer].  Education: high school.   Immunization History  Administered Date(s) Administered  . Moderna SARS-COVID-2 Vaccination 10/19/2019, 11/15/2019     Objective: Vital Signs: BP 109/68 (BP Location: Left Arm, Patient Position: Sitting, Cuff Size: Small)   Pulse 67   Ht  5\' 2"  (1.575 m)   Wt 158 lb 12.8 oz (72 kg)   BMI 29.04 kg/m    Physical Exam Vitals and nursing note reviewed.  Constitutional:      Appearance: She is well-developed.  HENT:     Head: Normocephalic and atraumatic.  Eyes:     Conjunctiva/sclera: Conjunctivae normal.  Cardiovascular:     Rate and Rhythm: Normal rate and regular rhythm.     Heart sounds: Normal heart sounds.  Pulmonary:     Effort: Pulmonary effort is normal.     Breath sounds: Normal breath sounds.  Abdominal:     General: Bowel sounds are normal.     Palpations: Abdomen is soft.  Musculoskeletal:     Cervical back: Normal range of motion.  Lymphadenopathy:     Cervical: No cervical adenopathy.  Skin:    General: Skin is warm and dry.     Capillary Refill: Capillary refill takes less than 2 seconds.  Neurological:     Mental Status: She is alert and oriented to person, place, and time.  Psychiatric:        Behavior: Behavior normal.      Musculoskeletal Exam: C-spine thoracic and lumbar spine were in good range of motion with some stiffness.  Shoulder joints, elbow joints, wrist joints with good range of motion.  She has bilateral PIP and DIP thickening.  She has some limitation with range of motion of her hip joints without any discomfort.  Knee joints, ankles and MTPs with good range of motion with no synovitis.  She had tenderness over bilateral trochanteric bursa consistent with trochanteric bursitis.  CDAI Exam: CDAI Score: - Patient Global: -; Provider Global: - Swollen: -; Tender: - Joint Exam 07/03/2020   No joint exam has been documented for this visit   There is currently no information documented on the homunculus. Go to the Rheumatology activity and complete the homunculus joint exam.  Investigation: No additional findings.  Imaging: No results found.  Recent Labs: Lab Results  Component Value Date   WBC 9.6 10/08/2018   HGB 13.9 10/08/2018   PLT 276 10/08/2018   NA 135  10/08/2018   K 3.4 (L) 10/08/2018   CL 103 10/08/2018   CO2 24 10/08/2018   GLUCOSE 114 (H) 10/08/2018   BUN 22 10/08/2018   CREATININE 0.72 10/08/2018   BILITOT 0.6 10/08/2018   ALKPHOS 76 10/08/2018   AST 20 10/08/2018   ALT 15 10/08/2018   PROT 7.3 10/08/2018   ALBUMIN 3.8 10/08/2018   CALCIUM 9.1 10/08/2018   GFRAA >60 10/08/2018    Speciality Comments: No specialty comments available.  Procedures:  Large Joint Inj: bilateral greater trochanter on 07/03/2020 3:25 PM Indications: pain Details: 27 G 1.5 in needle, lateral approach  Arthrogram: No  Medications (Right): 1.5 mL lidocaine 1 %; 40 mg triamcinolone acetonide 40 MG/ML Medications (Left): 1.5 mL lidocaine 1 %; 40 mg triamcinolone acetonide 40 MG/ML Outcome:  tolerated well, no immediate complications Procedure, treatment alternatives, risks and benefits explained, specific risks discussed. Consent was given by the patient. Immediately prior to procedure a time out was called to verify the correct patient, procedure, equipment, support staff and site/side marked as required. Patient was prepped and draped in the usual sterile fashion.     Allergies: Ibuprofen, Lexapro [escitalopram oxalate], Lisinopril, Mobic [meloxicam], Nabumetone, Prilosec otc [omeprazole magnesium], Zoloft [sertraline hcl], and Sulfa antibiotics   Assessment / Plan:     Visit Diagnoses: Primary osteoarthritis of both hands-she has severe osteoarthritis in her bilateral hands.  Joint protection muscle strengthening was discussed.  Primary osteoarthritis of left hip-she has some limitation with range of motion of her hip joints.  Trochanteric bursitis of both hips-she had tenderness on palpation about the trochanteric bursa.  She has been experiencing nocturnal pain.  Per her request bilateral trochanteric bursae were injected with cortisone as described below.  She tolerated the procedure well.  She was advised to monitor her blood pressure closely  after cortisone injection.  Side effects were discussed at length.  Osteopenia of multiple sites-she takes calcium supplement and vitamin D.  She has been very active.  She has been getting DEXA scan through her PCP.  Other medical problems are listed as follows:  Chronic constipation  Dyslipidemia  History of gastroesophageal reflux (GERD)  RLS (restless legs syndrome)  Essential hypertension  History of anxiety  Diverticulosis  History of kidney stones  Educated about COVID-19 virus infection-she is fully vaccinated against COVID-19.  Use of mask, social distancing and hand hygiene was discussed.  Instructions per ACR guidelines were placed in the AVS.  Orders: No orders of the defined types were placed in this encounter.  No orders of the defined types were placed in this encounter.    Follow-Up Instructions: Return in about 6 months (around 12/31/2020) for Osteoarthritis.   Bo Merino, MD  Note - This record has been created using Editor, commissioning.  Chart creation errors have been sought, but may not always  have been located. Such creation errors do not reflect on  the standard of medical care.

## 2020-07-03 ENCOUNTER — Ambulatory Visit (INDEPENDENT_AMBULATORY_CARE_PROVIDER_SITE_OTHER): Payer: Medicare Other | Admitting: Rheumatology

## 2020-07-03 ENCOUNTER — Other Ambulatory Visit: Payer: Self-pay

## 2020-07-03 ENCOUNTER — Encounter: Payer: Self-pay | Admitting: Rheumatology

## 2020-07-03 VITALS — BP 109/68 | HR 67 | Ht 62.0 in | Wt 158.8 lb

## 2020-07-03 DIAGNOSIS — Z8719 Personal history of other diseases of the digestive system: Secondary | ICD-10-CM | POA: Diagnosis not present

## 2020-07-03 DIAGNOSIS — Z87442 Personal history of urinary calculi: Secondary | ICD-10-CM | POA: Diagnosis not present

## 2020-07-03 DIAGNOSIS — G2581 Restless legs syndrome: Secondary | ICD-10-CM

## 2020-07-03 DIAGNOSIS — Z7189 Other specified counseling: Secondary | ICD-10-CM

## 2020-07-03 DIAGNOSIS — M7061 Trochanteric bursitis, right hip: Secondary | ICD-10-CM

## 2020-07-03 DIAGNOSIS — M19041 Primary osteoarthritis, right hand: Secondary | ICD-10-CM

## 2020-07-03 DIAGNOSIS — E785 Hyperlipidemia, unspecified: Secondary | ICD-10-CM | POA: Diagnosis not present

## 2020-07-03 DIAGNOSIS — Z8659 Personal history of other mental and behavioral disorders: Secondary | ICD-10-CM

## 2020-07-03 DIAGNOSIS — M8589 Other specified disorders of bone density and structure, multiple sites: Secondary | ICD-10-CM | POA: Diagnosis not present

## 2020-07-03 DIAGNOSIS — K5790 Diverticulosis of intestine, part unspecified, without perforation or abscess without bleeding: Secondary | ICD-10-CM | POA: Diagnosis not present

## 2020-07-03 DIAGNOSIS — M19042 Primary osteoarthritis, left hand: Secondary | ICD-10-CM

## 2020-07-03 DIAGNOSIS — M7062 Trochanteric bursitis, left hip: Secondary | ICD-10-CM

## 2020-07-03 DIAGNOSIS — K5909 Other constipation: Secondary | ICD-10-CM

## 2020-07-03 DIAGNOSIS — I10 Essential (primary) hypertension: Secondary | ICD-10-CM | POA: Diagnosis not present

## 2020-07-03 DIAGNOSIS — M1612 Unilateral primary osteoarthritis, left hip: Secondary | ICD-10-CM | POA: Diagnosis not present

## 2020-07-03 MED ORDER — LIDOCAINE HCL 1 % IJ SOLN
1.5000 mL | INTRAMUSCULAR | Status: AC | PRN
  Administered 2020-07-03: 1.5 mL

## 2020-07-03 MED ORDER — TRIAMCINOLONE ACETONIDE 40 MG/ML IJ SUSP
40.0000 mg | INTRAMUSCULAR | Status: AC | PRN
  Administered 2020-07-03: 40 mg via INTRA_ARTICULAR

## 2020-07-03 NOTE — Patient Instructions (Addendum)
Iliotibial Band Syndrome Rehab Ask your health care provider which exercises are safe for you. Do exercises exactly as told by your health care provider and adjust them as directed. It is normal to feel mild stretching, pulling, tightness, or discomfort as you do these exercises. Stop right away if you feel sudden pain or your pain gets significantly worse. Do not begin these exercises until told by your health care provider. Stretching and range-of-motion exercises These exercises warm up your muscles and joints and improve the movement and flexibility of your hip and pelvis. Quadriceps stretch, prone  1. Lie on your abdomen on a firm surface, such as a bed or padded floor (prone position). 2. Bend your left / right knee and reach back to hold your ankle or pant leg. If you cannot reach your ankle or pant leg, loop a belt around your foot and grab the belt instead. 3. Gently pull your heel toward your buttocks. Your knee should not slide out to the side. You should feel a stretch in the front of your thigh and knee (quadriceps). 4. Hold this position for __________ seconds. Repeat __________ times. Complete this exercise __________ times a day. Iliotibial band stretch An iliotibial band is a strong band of muscle tissue that runs from the outer side of your hip to the outer side of your thigh and knee. 1. Lie on your side with your left / right leg in the top position. 2. Bend both of your knees and grab your left / right ankle. Stretch out your bottom arm to help you balance. 3. Slowly bring your top knee back so your thigh goes behind your trunk. 4. Slowly lower your top leg toward the floor until you feel a gentle stretch on the outside of your left / right hip and thigh. If you do not feel a stretch and your knee will not fall farther, place the heel of your other foot on top of your knee and pull your knee down toward the floor with your foot. 5. Hold this position for __________  seconds. Repeat __________ times. Complete this exercise __________ times a day. Strengthening exercises These exercises build strength and endurance in your hip and pelvis. Endurance is the ability to use your muscles for a long time, even after they get tired. Straight leg raises, side-lying This exercise strengthens the muscles that rotate the leg at the hip and move it away from your body (hip abductors). 1. Lie on your side with your left / right leg in the top position. Lie so your head, shoulder, hip, and knee line up. You may bend your bottom knee to help you balance. 2. Roll your hips slightly forward so your hips are stacked directly over each other and your left / right knee is facing forward. 3. Tense the muscles in your outer thigh and lift your top leg 4-6 inches (10-15 cm). 4. Hold this position for __________ seconds. 5. Slowly return to the starting position. Let your muscles relax completely before doing another repetition. Repeat __________ times. Complete this exercise __________ times a day. Leg raises, prone This exercise strengthens the muscles that move the hips (hip extensors). 1. Lie on your abdomen on your bed or a firm surface. You can put a pillow under your hips if that is more comfortable for your lower back. 2. Bend your left / right knee so your foot is straight up in the air. 3. Squeeze your buttocks muscles and lift your left / right thigh   off the bed. Do not let your back arch. 4. Tense your thigh muscle as hard as you can without increasing any knee pain. 5. Hold this position for __________ seconds. 6. Slowly lower your leg to the starting position and allow it to relax completely. Repeat __________ times. Complete this exercise __________ times a day. Hip hike 1. Stand sideways on a bottom step. Stand on your left / right leg with your other foot unsupported next to the step. You can hold on to the railing or wall for balance if needed. 2. Keep your knees  straight and your torso square. Then lift your left / right hip up toward the ceiling. 3. Slowly let your left / right hip lower toward the floor, past the starting position. Your foot should get closer to the floor. Do not lean or bend your knees. Repeat __________ times. Complete this exercise __________ times a day. This information is not intended to replace advice given to you by your health care provider. Make sure you discuss any questions you have with your health care provider. Document Revised: 01/13/2019 Document Reviewed: 07/14/2018 Elsevier Patient Education  2020 [employer].  COVID-19 vaccine recommendations:   COVID-19 vaccine is recommended for everyone (unless you are allergic to a vaccine component), even if you are on a medication that suppresses your immune system.   Do not take Tylenol or any anti-inflammatory medications (NSAIDs) 24 hours prior to the COVID-19 vaccination.   There is no direct evidence about the efficacy of the COVID-19 vaccine in individuals who are on medications that suppress the immune system.   Even if you are fully vaccinated, and you are on any medications that suppress your immune system, please continue to wear a mask, maintain at least six feet social distance and practice hand hygiene.   If you develop a COVID-19 infection, please contact your PCP or our office to determine if you need antibody infusion.  The booster vaccine is now available for immunocompromised patients. It is advised that if you had Pfizer vaccine you should get [employer] booster.  If you had a Moderna vaccine then you should get a Moderna booster. Johnson and Wynetta Emery does not have a booster vaccine at this time.  Please see the following web sites for updated information.   https://www.rheumatology.org/Portals/0/Files/COVID-19-Vaccination-Patient-Resources.pdf  https://www.rheumatology.org/About-Us/Newsroom/Press-Releases/ID/1159

## 2020-07-10 DIAGNOSIS — Z23 Encounter for immunization: Secondary | ICD-10-CM | POA: Diagnosis not present

## 2020-07-24 DIAGNOSIS — Z961 Presence of intraocular lens: Secondary | ICD-10-CM | POA: Diagnosis not present

## 2020-07-24 DIAGNOSIS — H52223 Regular astigmatism, bilateral: Secondary | ICD-10-CM | POA: Diagnosis not present

## 2020-07-24 DIAGNOSIS — H5211 Myopia, right eye: Secondary | ICD-10-CM | POA: Diagnosis not present

## 2020-07-24 DIAGNOSIS — I10 Essential (primary) hypertension: Secondary | ICD-10-CM | POA: Diagnosis not present

## 2020-07-24 DIAGNOSIS — H43393 Other vitreous opacities, bilateral: Secondary | ICD-10-CM | POA: Diagnosis not present

## 2020-07-24 DIAGNOSIS — H524 Presbyopia: Secondary | ICD-10-CM | POA: Diagnosis not present

## 2020-07-24 DIAGNOSIS — H35039 Hypertensive retinopathy, unspecified eye: Secondary | ICD-10-CM | POA: Diagnosis not present

## 2020-08-13 DIAGNOSIS — Z23 Encounter for immunization: Secondary | ICD-10-CM | POA: Diagnosis not present

## 2020-08-14 ENCOUNTER — Ambulatory Visit
Admission: RE | Admit: 2020-08-14 | Discharge: 2020-08-14 | Disposition: A | Discharge status: Home / Self Care | Payer: Medicare Other | Source: Ambulatory Visit | Attending: Family Medicine | Admitting: Family Medicine

## 2020-08-14 ENCOUNTER — Other Ambulatory Visit: Payer: Self-pay

## 2020-08-14 DIAGNOSIS — M25571 Pain in right ankle and joints of right foot: Secondary | ICD-10-CM

## 2020-08-14 DIAGNOSIS — R079 Chest pain, unspecified: Secondary | ICD-10-CM | POA: Diagnosis not present

## 2020-08-14 DIAGNOSIS — M79661 Pain in right lower leg: Secondary | ICD-10-CM | POA: Diagnosis not present

## 2020-08-14 DIAGNOSIS — M7989 Other specified soft tissue disorders: Secondary | ICD-10-CM | POA: Diagnosis not present

## 2020-08-14 DIAGNOSIS — M19071 Primary osteoarthritis, right ankle and foot: Secondary | ICD-10-CM | POA: Diagnosis not present

## 2020-09-12 ENCOUNTER — Encounter: Payer: Self-pay | Admitting: Cardiovascular Disease

## 2020-09-12 ENCOUNTER — Other Ambulatory Visit: Payer: Self-pay

## 2020-09-12 ENCOUNTER — Ambulatory Visit (INDEPENDENT_AMBULATORY_CARE_PROVIDER_SITE_OTHER): Payer: Medicare Other | Admitting: Cardiovascular Disease

## 2020-09-12 DIAGNOSIS — Z8249 Family history of ischemic heart disease and other diseases of the circulatory system: Secondary | ICD-10-CM | POA: Diagnosis not present

## 2020-09-12 DIAGNOSIS — E785 Hyperlipidemia, unspecified: Secondary | ICD-10-CM | POA: Diagnosis not present

## 2020-09-12 DIAGNOSIS — R079 Chest pain, unspecified: Secondary | ICD-10-CM

## 2020-09-12 DIAGNOSIS — I10 Essential (primary) hypertension: Secondary | ICD-10-CM

## 2020-09-12 MED ORDER — ISOSORBIDE MONONITRATE ER 30 MG PO TB24: 15.0000 mg | ORAL_TABLET | Freq: Every day | ORAL | 3 refills | Status: DC

## 2020-09-12 MED ORDER — METOPROLOL TARTRATE 50 MG PO TABS: ORAL_TABLET | ORAL | 0 refills | Status: DC

## 2020-09-12 NOTE — Assessment & Plan Note (Signed)
Alicia [name] has complained of exertional chest pain over the last 3 to 4 months is fairly predictable associated shortness of breath and bilateral upper extremity radiation.  She was referred for evaluation of this.  Her symptoms sound fairly typical.  I am going to obtain a 2D echo and a coronary CTA to further evaluate.

## 2020-09-12 NOTE — Assessment & Plan Note (Signed)
History of essential pretension with blood pressure measured today of 110/62.  She is on amlodipine, Avapro and metoprolol as well as Aldactone.

## 2020-09-12 NOTE — Patient Instructions (Addendum)
Medication Instructions:  START isosorbide mononitrate (imdur) 15mg  once daily.  *If you need a refill on your cardiac medications before your next appointment, please call your pharmacy*   Lab Work: Your physician recommends that you return for lab work in: 1 week prior to coronary CTA  If you have labs (blood work) drawn today and your tests are completely normal, you will receive your results only by: Marland Kitchen MyChart Message (if you have MyChart) OR . A paper copy in the mail If you have any lab test that is abnormal or we need to change your treatment, we will call you to review the results.   Testing/Procedures: Your physician has requested that you have an echocardiogram. Echocardiography is a painless test that uses sound waves to create images of your heart. It provides your doctor with information about the size and shape of your heart and how well your heart's chambers and valves are working. This procedure takes approximately one hour. There are no restrictions for this procedure. [place]. 3rd Floor.    Your cardiac CT will be scheduled at one of the below locations:   [facility] [address] [place], [place] [address] [phone]  If scheduled at [facility], please arrive at the [facility] main entrance of [facility] 30 minutes prior to test start time. Proceed to the [facility] Radiology Department (first floor) to check-in and test prep.  Please follow these instructions carefully (unless otherwise directed):  Hold all erectile dysfunction medications at least 3 days (72 hrs) prior to test.  On the Night Before the Test: . Be sure to Drink plenty of water. . Do not consume any caffeinated/decaffeinated beverages or chocolate 12 hours prior to your test. . Do not take any antihistamines 12 hours prior to your test.  On the Day of the Test: . Drink plenty of water. Do not drink any water within one hour of the test. . Do not  eat any food 4 hours prior to the test. . You may take your regular medications prior to the test.  . Take metoprolol (Lopressor) two hours prior to test. . HOLD Furosemide/Hydrochlorothiazide morning of the test. . FEMALES- please wear underwire-free bra if available               -Take metoprolol (Lopressor) 50mg  2 hours prior to test (if applicable).                     After the Test: . Drink plenty of water. . After receiving IV contrast, you may experience a mild flushed feeling. This is normal. . On occasion, you may experience a mild rash up to 24 hours after the test. This is not dangerous. If this occurs, you can take Benadryl 25 mg and increase your fluid intake. . If you experience trouble breathing, this can be serious. If it is severe call 911 IMMEDIATELY. If it is mild, please call our office. . If you take any of these medications: Glipizide/Metformin, Avandament, Glucavance, please do not take 48 hours after completing test unless otherwise instructed.   Once we have confirmed authorization from your insurance company, we will call you to set up a date and time for your test. Based on how quickly your insurance processes prior authorizations requests, please allow up to 4 weeks to be contacted for scheduling your Cardiac CT appointment. Be advised that routine Cardiac CT appointments could be scheduled as many as 8 weeks  after your provider has ordered it.  For non-scheduling related questions, please contact the cardiac imaging nurse navigator should you have any questions/concerns: Marchia Bond, Cardiac Imaging Nurse Navigator Burley Saver, Interim Cardiac Imaging Nurse [place] [facility] Services Direct Office Dial: [phone]   For scheduling needs, including cancellations and rescheduling, please call [place], [phone].    Follow-Up: At [facility] you and your health needs are our priority.  As part of our continuing mission to  provide you with exceptional heart care, we have created designated Provider Care Teams.  These Care Teams include your primary Cardiologist (physician) and Advanced Practice Providers (APPs -  Physician Assistants and Nurse Practitioners) who all work together to provide you with the care you need, when you need it.  We recommend signing up for the patient portal called "MyChart".  Sign up information is provided on this After Visit Summary.  MyChart is used to connect with patients for Virtual Visits (Telemedicine).  Patients are able to view lab/test results, encounter notes, upcoming appointments, etc.  Non-urgent messages can be sent to your provider as well.   To learn more about what you can do with MyChart, go to [email or web address].    Your next appointment:   2 month(s)  The format for your next appointment:   In Person  Provider:   Quay Burow, MD

## 2020-09-12 NOTE — Progress Notes (Addendum)
Coronary    09/12/2020 Alicia [name]   [birth year]  [id number]  Primary Physician Gaynelle Arabian, MD Primary Cardiologist: Lorretta Harp MD Lupe Carney, [place]  HPI:  Alicia [name] is a 79 y.o. 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  She was referred by Dr. Alroy Dust, her primary provider, for evaluation of exertional chest pain.  Her risk factor profile is notable for 15 to 20 pack years of tobacco abuse having quit 25 years ago, treated hypertension and hyperlipidemia.  Both of her parents died of myocardial infarction's.  She is never had a heart attack or stroke.  She does have RLS and GERD.  She is complained of new onset exertional chest pain for last 3 to 4 months that occurs with walking associated shortness of breath and upper extremity radiation.   Current Meds  Medication Sig  . amLODipine (NORVASC) 5 MG tablet Take 2.5 mg by mouth daily.  . calcium-vitamin D (OSCAL WITH D) 500-200 MG-UNIT per tablet Take 1 tablet by mouth daily.  . Celecoxib (CELEBREX PO) Take by mouth daily.  Marland Kitchen glucosamine-chondroitin 500-400 MG tablet Take 1 tablet by mouth 3 (three) times daily.  . irbesartan (AVAPRO) 300 MG tablet   . LORazepam (ATIVAN) 0.5 MG tablet as needed.   . meclizine (ANTIVERT) 25 MG tablet as needed.  . metoprolol succinate (TOPROL-XL) 50 MG 24 hr tablet   . multivitamin-lutein (OCUVITE-LUTEIN) CAPS Take 1 capsule by mouth daily.  . OMEGA 3 1000 MG CAPS Take 1 capsule by mouth daily.  Marland Kitchen omeprazole (PRILOSEC) 40 MG capsule   . rOPINIRole (REQUIP) 1 MG tablet Take 1 tablet (1 mg total) by mouth 3 (three) times daily.  . simvastatin (ZOCOR) 20 MG tablet   . spironolactone (ALDACTONE) 25 MG tablet   . traZODone (DESYREL) 50 MG tablet Take 50 mg by mouth at bedtime.      Allergies  Allergen Reactions  . Ibuprofen     GI UPSET  . Lexapro  [Escitalopram Oxalate]     GERD  . Lisinopril Cough  . Mobic [Meloxicam]     LEG PAIN  . Nabumetone     GI UPSET  . Prilosec Otc [Omeprazole Magnesium]     ARM SPASM  . Zoloft [Sertraline Hcl]     INSOMNIA  . Sulfa Antibiotics Rash    Social History   Socioeconomic History  . Marital status: Widowed    Spouse name: Not on file  . Number of children: Not on file  . Years of education: Not on file  . Highest education level: Not on file  Occupational History  . Not on file  Tobacco Use  . Smoking status: Former Smoker    Packs/day: 0.50    Years: 20.00    Pack years: 10.00    Types: Cigarettes    Quit date: 10/06/1996    Years since quitting: 23.9  . Smokeless tobacco: Never Used  Vaping Use  . Vaping Use: Never used  Substance and Sexual Activity  . Alcohol use: No    Alcohol/week: 0.0 standard drinks  . Drug use: No  . Sexual activity: Not on file  Other Topics Concern  . Not on file  Social History Narrative   Lives alone in a one story home.  Has 3 children.     Semi retired.     Worked in [place]  for [employer].  Education: high school.   Social Determinants of Health   Financial Resource Strain:   . Difficulty of Paying Living Expenses: Not on file  Food Insecurity:   . Worried About Charity fundraiser in the Last Year: Not on file  . Ran Out of Food in the Last Year: Not on file  Transportation Needs:   . Lack of Transportation (Medical): Not on file  . Lack of Transportation (Non-Medical): Not on file  Physical Activity:   . Days of Exercise per Week: Not on file  . Minutes of Exercise per Session: Not on file  Stress:   . Feeling of Stress : Not on file  Social Connections:   . Frequency of Communication with Friends and Family: Not on file  . Frequency of Social Gatherings with Friends and Family: Not on file  . Attends Religious Services: Not on file  . Active Member of Clubs or Organizations: Not on file  . Attends Archivist Meetings:  Not on file  . Marital Status: Not on file  Intimate Partner Violence:   . Fear of Current or Ex-Partner: Not on file  . Emotionally Abused: Not on file  . Physically Abused: Not on file  . Sexually Abused: Not on file     Review of Systems: General: negative for chills, fever, night sweats or weight changes.  Cardiovascular: negative for chest pain, dyspnea on exertion, edema, orthopnea, palpitations, paroxysmal nocturnal dyspnea or shortness of breath Dermatological: negative for rash Respiratory: negative for cough or wheezing Urologic: negative for hematuria Abdominal: negative for nausea, vomiting, diarrhea, bright red blood per rectum, melena, or hematemesis Neurologic: negative for visual changes, syncope, or dizziness All other systems reviewed and are otherwise negative except as noted above.    Blood pressure 110/62, pulse 61, height 5\' 2"  (1.575 m), weight 154 lb (69.9 kg).  General appearance: alert and no distress Neck: no adenopathy, no carotid bruit, no JVD, supple, symmetrical, trachea midline and thyroid not enlarged, symmetric, no tenderness/mass/nodules Lungs: clear to auscultation bilaterally Heart: regular rate and rhythm, S1, S2 normal, no murmur, click, rub or gallop Extremities: extremities normal, atraumatic, no cyanosis or edema Pulses: 2+ and symmetric Skin: Skin color, texture, turgor normal. No rashes or lesions Neurologic: Alert and oriented X 3, normal strength and tone. Normal symmetric reflexes. Normal coordination and gait  EKG sinus rhythm at 61 with right bundle branch block.  I personally reviewed this EKG.  ASSESSMENT AND PLAN:   Essential hypertension History of essential pretension with blood pressure measured today of 110/62.  She is on amlodipine, Avapro and metoprolol as well as Aldactone.  Dyslipidemia History of dyslipidemia on statin therapy with lipid profile performed 03/27/2020 revealing total cholesterol of 151, LDL of 81 and HDL  of 55.  Family history of heart disease Both mother and father died of myocardial infarction's  Chest pain of uncertain etiology [name] has complained of exertional chest pain over the last 3 to 4 months is fairly predictable associated shortness of breath and bilateral upper extremity radiation.  She was referred for evaluation of this.  Her symptoms sound fairly typical.  I am going to obtain a 2D echo and a coronary CTA to further evaluate.      Lorretta Harp MD FACP,FACC,FAHA, [facility] 09/12/2020 2:15 PM versus   Addendum-because her insurance company would not authorize a coronary CTA and because of the nature of her symptoms I decided to proceed with diagnostic coronary  angiography.  I have reviewed the risks, indications, and alternatives to cardiac catheterization, possible angioplasty, and stenting with the patient. Risks include but are not limited to bleeding, infection, vascular injury, stroke, myocardial infection, arrhythmia, kidney injury, radiation-related injury in the case of prolonged fluoroscopy use, emergency cardiac surgery, and death. The patient understands the risks of serious complication is 1-2 in 8350 with diagnostic cardiac cath and 1-2% or less with angioplasty/stenting.   Lorretta Harp, M.D., [place], [facility], [place] [address] [place], [place]  [address]  [phone] 09/18/2020 4:19 PM

## 2020-09-12 NOTE — H&P (View-Only) (Signed)
Coronary    09/12/2020 Alicia [name]   September 17, 1941  [id number]  Primary Physician Gaynelle Arabian, MD Primary Cardiologist: Lorretta Harp MD Lupe Carney, [place]  HPI:  Alicia [name] is a 79 y.o. 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  She was referred by Dr. Alroy Dust, her primary provider, for evaluation of exertional chest pain.  Her risk factor profile is notable for 15 to 20 pack years of tobacco abuse having quit 25 years ago, treated hypertension and hyperlipidemia.  Both of her parents died of myocardial infarction's.  She is never had a heart attack or stroke.  She does have RLS and GERD.  She is complained of new onset exertional chest pain for last 3 to 4 months that occurs with walking associated shortness of breath and upper extremity radiation.   Current Meds  Medication Sig  . amLODipine (NORVASC) 5 MG tablet Take 2.5 mg by mouth daily.  . calcium-vitamin D (OSCAL WITH D) 500-200 MG-UNIT per tablet Take 1 tablet by mouth daily.  . Celecoxib (CELEBREX PO) Take by mouth daily.  Marland Kitchen glucosamine-chondroitin 500-400 MG tablet Take 1 tablet by mouth 3 (three) times daily.  . irbesartan (AVAPRO) 300 MG tablet   . LORazepam (ATIVAN) 0.5 MG tablet as needed.   . meclizine (ANTIVERT) 25 MG tablet as needed.  . metoprolol succinate (TOPROL-XL) 50 MG 24 hr tablet   . multivitamin-lutein (OCUVITE-LUTEIN) CAPS Take 1 capsule by mouth daily.  . OMEGA 3 1000 MG CAPS Take 1 capsule by mouth daily.  Marland Kitchen omeprazole (PRILOSEC) 40 MG capsule   . rOPINIRole (REQUIP) 1 MG tablet Take 1 tablet (1 mg total) by mouth 3 (three) times daily.  . simvastatin (ZOCOR) 20 MG tablet   . spironolactone (ALDACTONE) 25 MG tablet   . traZODone (DESYREL) 50 MG tablet Take 50 mg by mouth at bedtime.      Allergies  Allergen Reactions  . Ibuprofen     GI UPSET  . Lexapro  [Escitalopram Oxalate]     GERD  . Lisinopril Cough  . Mobic [Meloxicam]     LEG PAIN  . Nabumetone     GI UPSET  . Prilosec Otc [Omeprazole Magnesium]     ARM SPASM  . Zoloft [Sertraline Hcl]     INSOMNIA  . Sulfa Antibiotics Rash    Social History   Socioeconomic History  . Marital status: Widowed    Spouse name: Not on file  . Number of children: Not on file  . Years of education: Not on file  . Highest education level: Not on file  Occupational History  . Not on file  Tobacco Use  . Smoking status: Former Smoker    Packs/day: 0.50    Years: 20.00    Pack years: 10.00    Types: Cigarettes    Quit date: 10/06/1996    Years since quitting: 23.9  . Smokeless tobacco: Never Used  Vaping Use  . Vaping Use: Never used  Substance and Sexual Activity  . Alcohol use: No    Alcohol/week: 0.0 standard drinks  . Drug use: No  . Sexual activity: Not on file  Other Topics Concern  . Not on file  Social History Narrative   Lives alone in a one story home.  Has 3 children.     Semi retired.     Worked in [place]  for [employer].  Education: high school.   Social Determinants of Health   Financial Resource Strain:   . Difficulty of Paying Living Expenses: Not on file  Food Insecurity:   . Worried About Charity fundraiser in the Last Year: Not on file  . Ran Out of Food in the Last Year: Not on file  Transportation Needs:   . Lack of Transportation (Medical): Not on file  . Lack of Transportation (Non-Medical): Not on file  Physical Activity:   . Days of Exercise per Week: Not on file  . Minutes of Exercise per Session: Not on file  Stress:   . Feeling of Stress : Not on file  Social Connections:   . Frequency of Communication with Friends and Family: Not on file  . Frequency of Social Gatherings with Friends and Family: Not on file  . Attends Religious Services: Not on file  . Active Member of Clubs or Organizations: Not on file  . Attends Archivist Meetings:  Not on file  . Marital Status: Not on file  Intimate Partner Violence:   . Fear of Current or Ex-Partner: Not on file  . Emotionally Abused: Not on file  . Physically Abused: Not on file  . Sexually Abused: Not on file     Review of Systems: General: negative for chills, fever, night sweats or weight changes.  Cardiovascular: negative for chest pain, dyspnea on exertion, edema, orthopnea, palpitations, paroxysmal nocturnal dyspnea or shortness of breath Dermatological: negative for rash Respiratory: negative for cough or wheezing Urologic: negative for hematuria Abdominal: negative for nausea, vomiting, diarrhea, bright red blood per rectum, melena, or hematemesis Neurologic: negative for visual changes, syncope, or dizziness All other systems reviewed and are otherwise negative except as noted above.    Blood pressure 110/62, pulse 61, height 5\' 2"  (1.575 m), weight 154 lb (69.9 kg).  General appearance: alert and no distress Neck: no adenopathy, no carotid bruit, no JVD, supple, symmetrical, trachea midline and thyroid not enlarged, symmetric, no tenderness/mass/nodules Lungs: clear to auscultation bilaterally Heart: regular rate and rhythm, S1, S2 normal, no murmur, click, rub or gallop Extremities: extremities normal, atraumatic, no cyanosis or edema Pulses: 2+ and symmetric Skin: Skin color, texture, turgor normal. No rashes or lesions Neurologic: Alert and oriented X 3, normal strength and tone. Normal symmetric reflexes. Normal coordination and gait  EKG sinus rhythm at 61 with right bundle branch block.  I personally reviewed this EKG.  ASSESSMENT AND PLAN:   Essential hypertension History of essential pretension with blood pressure measured today of 110/62.  She is on amlodipine, Avapro and metoprolol as well as Aldactone.  Dyslipidemia History of dyslipidemia on statin therapy with lipid profile performed 03/27/2020 revealing total cholesterol of 151, LDL of 81 and HDL  of 55.  Family history of heart disease Both mother and father died of myocardial infarction's  Chest pain of uncertain etiology [name] has complained of exertional chest pain over the last 3 to 4 months is fairly predictable associated shortness of breath and bilateral upper extremity radiation.  She was referred for evaluation of this.  Her symptoms sound fairly typical.  I am going to obtain a 2D echo and a coronary CTA to further evaluate.      Lorretta Harp MD FACP,FACC,FAHA, [facility] 09/12/2020 2:15 PM versus   Addendum-because her insurance company would not authorize a coronary CTA and because of the nature of her symptoms I decided to proceed with diagnostic coronary  angiography.  I have reviewed the risks, indications, and alternatives to cardiac catheterization, possible angioplasty, and stenting with the patient. Risks include but are not limited to bleeding, infection, vascular injury, stroke, myocardial infection, arrhythmia, kidney injury, radiation-related injury in the case of prolonged fluoroscopy use, emergency cardiac surgery, and death. The patient understands the risks of serious complication is 1-2 in 0479 with diagnostic cardiac cath and 1-2% or less with angioplasty/stenting.   Lorretta Harp, M.D., [place], [facility], [place] [address] [place], [place]  [address]  [phone] 09/18/2020 4:19 PM

## 2020-09-12 NOTE — Assessment & Plan Note (Signed)
History of dyslipidemia on statin therapy with lipid profile performed 03/27/2020 revealing total cholesterol of 151, LDL of 81 and HDL of 55.

## 2020-09-12 NOTE — Assessment & Plan Note (Signed)
Both mother and father died of myocardial infarction's

## 2020-09-13 ENCOUNTER — Other Ambulatory Visit: Payer: Self-pay

## 2020-09-13 DIAGNOSIS — Z8249 Family history of ischemic heart disease and other diseases of the circulatory system: Secondary | ICD-10-CM

## 2020-09-13 DIAGNOSIS — Z8719 Personal history of other diseases of the digestive system: Secondary | ICD-10-CM

## 2020-09-13 DIAGNOSIS — R079 Chest pain, unspecified: Secondary | ICD-10-CM

## 2020-09-13 DIAGNOSIS — I10 Essential (primary) hypertension: Secondary | ICD-10-CM

## 2020-09-13 DIAGNOSIS — E785 Hyperlipidemia, unspecified: Secondary | ICD-10-CM

## 2020-09-14 ENCOUNTER — Telehealth: Payer: Self-pay

## 2020-09-14 ENCOUNTER — Other Ambulatory Visit: Payer: Self-pay

## 2020-09-14 DIAGNOSIS — R079 Chest pain, unspecified: Secondary | ICD-10-CM

## 2020-09-14 MED ORDER — SODIUM CHLORIDE 0.9% FLUSH: 3.0000 mL | Freq: Two times a day (BID) | INTRAVENOUS | Status: DC

## 2020-09-14 NOTE — Telephone Encounter (Signed)
Spoke with pt on the phone. Went over pre-procedure for heart cath with pt.  We do not have recent labs so those will be done the morning of the procedure.  Pt verbalizes understanding. Instructed pt if she has any questions to call our office.

## 2020-09-17 ENCOUNTER — Telehealth: Payer: Self-pay | Admitting: Cardiovascular Disease

## 2020-09-17 NOTE — Telephone Encounter (Signed)
Patient states that she got a call from [facility] states that she had to be there tomorrow at 9:40am but she has a covid test in [place] at 9:55am. She also states that she is not sure what time she needs to arrive for her procedure on 12/16. Please advise.

## 2020-09-17 NOTE — Telephone Encounter (Signed)
Returned the call to the patient. She has a covid test tomorrow at 9:55 am. She has been made aware to arrive at the [facility] by 9:40.  She will arrive for the cath on 12/16 at 10:30 for pre procedure lab work.   She has been advised to call back if anything further is needed.

## 2020-09-18 ENCOUNTER — Other Ambulatory Visit (HOSPITAL_COMMUNITY)
Admission: RE | Admit: 2020-09-18 | Discharge: 2020-09-18 | Disposition: A | Discharge status: Home / Self Care | Payer: Medicare Other | Source: Ambulatory Visit | Attending: Cardiovascular Disease | Admitting: Cardiovascular Disease

## 2020-09-18 DIAGNOSIS — Z20822 Contact with and (suspected) exposure to covid-19: Secondary | ICD-10-CM | POA: Diagnosis not present

## 2020-09-18 DIAGNOSIS — Z01812 Encounter for preprocedural laboratory examination: Secondary | ICD-10-CM | POA: Insufficient documentation

## 2020-09-18 LAB — SARS CORONAVIRUS 2 (TAT 6-24 HRS): SARS Coronavirus 2: NEGATIVE

## 2020-09-19 ENCOUNTER — Telehealth: Payer: Self-pay | Admitting: *Deleted

## 2020-09-19 NOTE — Telephone Encounter (Signed)
Pt contacted pre-catheterization scheduled at [facility] for: Thursday September 20, 2020 1:30 PM Verified arrival time and place: [facility]) at: 10:30 AM-needs BMP/CBC   No solid food after midnight prior to cath, clear liquids until 5 AM day of procedure.  Hold: Spironolactone -AM of procedure  Except hold medications AM meds can be  taken pre-cath with sips of water including: ASA 81 mg   Confirmed patient has responsible adult to drive home post procedure and be with patient first 24 hours after arriving home: yes  You are allowed ONE visitor in the waiting room during the time you are at the hospital for your procedure. Both you and your visitor must wear a mask once you enter the hospital.       COVID-19 Pre-Screening Questions:  . In the past 14 days have you had any symptoms concerning for COVID-19 infection (fever, chills, cough, or new shortness of breath)? no . In the past 14 days have you been around anyone with known Covid 19? No   Reviewed procedure/mask/visitor instructions, COVID-19 questions with patient. Patient aware she will need BMP/CBC on arrival to Short Stay.

## 2020-09-20 ENCOUNTER — Ambulatory Visit (HOSPITAL_COMMUNITY)
Admission: RE | Admit: 2020-09-20 | Discharge: 2020-09-20 | Disposition: A | Discharge status: Home / Self Care | Payer: Medicare Other | Attending: Cardiovascular Disease | Admitting: Cardiovascular Disease

## 2020-09-20 ENCOUNTER — Encounter (HOSPITAL_COMMUNITY)
Admission: RE | Disposition: A | Discharge status: Home / Self Care | Payer: Self-pay | Source: Home / Self Care | Attending: Cardiovascular Disease

## 2020-09-20 ENCOUNTER — Other Ambulatory Visit: Payer: Self-pay

## 2020-09-20 DIAGNOSIS — R079 Chest pain, unspecified: Secondary | ICD-10-CM | POA: Diagnosis present

## 2020-09-20 DIAGNOSIS — Z87891 Personal history of nicotine dependence: Secondary | ICD-10-CM | POA: Diagnosis not present

## 2020-09-20 DIAGNOSIS — Z888 Allergy status to other drugs, medicaments and biological substances status: Secondary | ICD-10-CM | POA: Diagnosis not present

## 2020-09-20 DIAGNOSIS — Z886 Allergy status to analgesic agent status: Secondary | ICD-10-CM | POA: Diagnosis not present

## 2020-09-20 DIAGNOSIS — Z882 Allergy status to sulfonamides status: Secondary | ICD-10-CM | POA: Insufficient documentation

## 2020-09-20 DIAGNOSIS — Z791 Long term (current) use of non-steroidal anti-inflammatories (NSAID): Secondary | ICD-10-CM | POA: Diagnosis not present

## 2020-09-20 DIAGNOSIS — Z79899 Other long term (current) drug therapy: Secondary | ICD-10-CM | POA: Insufficient documentation

## 2020-09-20 DIAGNOSIS — I10 Essential (primary) hypertension: Secondary | ICD-10-CM | POA: Diagnosis not present

## 2020-09-20 DIAGNOSIS — Z8249 Family history of ischemic heart disease and other diseases of the circulatory system: Secondary | ICD-10-CM | POA: Diagnosis not present

## 2020-09-20 DIAGNOSIS — E785 Hyperlipidemia, unspecified: Secondary | ICD-10-CM | POA: Diagnosis not present

## 2020-09-20 HISTORY — PX: LEFT HEART CATH AND CORONARY ANGIOGRAPHY: CATH118249

## 2020-09-20 LAB — CBC
HCT: 40.7 % (ref 36.0–46.0)
Hemoglobin: 13.8 g/dL (ref 12.0–15.0)
MCH: 31.2 pg (ref 26.0–34.0)
MCHC: 33.9 g/dL (ref 30.0–36.0)
MCV: 91.9 fL (ref 80.0–100.0)
Platelets: 336 10*3/uL (ref 150–400)
RBC: 4.43 MIL/uL (ref 3.87–5.11)
RDW: 13 % (ref 11.5–15.5)
WBC: 7.7 10*3/uL (ref 4.0–10.5)
nRBC: 0 % (ref 0.0–0.2)

## 2020-09-20 LAB — BASIC METABOLIC PANEL
Anion gap: 12 (ref 5–15)
BUN: 35 mg/dL — ABNORMAL HIGH (ref 8–23)
CO2: 23 mmol/L (ref 22–32)
Calcium: 10.1 mg/dL (ref 8.9–10.3)
Chloride: 101 mmol/L (ref 98–111)
Creatinine, Ser: 1.01 mg/dL — ABNORMAL HIGH (ref 0.44–1.00)
GFR, Estimated: 57 mL/min — ABNORMAL LOW (ref >60)
Glucose, Bld: 104 mg/dL — ABNORMAL HIGH (ref 70–99)
Potassium: 4.2 mmol/L (ref 3.5–5.1)
Sodium: 136 mmol/L (ref 135–145)

## 2020-09-20 SURGERY — LEFT HEART CATH AND CORONARY ANGIOGRAPHY
Anesthesia: LOCAL

## 2020-09-20 MED ORDER — FENTANYL CITRATE (PF) 100 MCG/2ML IJ SOLN
INTRAMUSCULAR | Status: AC
  Filled 2020-09-20: qty 2

## 2020-09-20 MED ORDER — SODIUM CHLORIDE 0.9 % IV SOLN
INTRAVENOUS | Status: AC | PRN
  Administered 2020-09-20: 500 mL via INTRAVENOUS

## 2020-09-20 MED ORDER — MIDAZOLAM HCL 2 MG/2ML IJ SOLN
INTRAMUSCULAR | Status: DC | PRN
  Administered 2020-09-20: 1 mg via INTRAVENOUS

## 2020-09-20 MED ORDER — ACETAMINOPHEN 325 MG PO TABS: 650.0000 mg | ORAL_TABLET | ORAL | Status: DC | PRN

## 2020-09-20 MED ORDER — ASPIRIN 81 MG PO CHEW
81.0000 mg | CHEWABLE_TABLET | ORAL | Status: AC
  Administered 2020-09-20: 11:00:00 81 mg via ORAL
  Filled 2020-09-20: qty 1

## 2020-09-20 MED ORDER — SODIUM CHLORIDE 0.9 % IV SOLN: 250.0000 mL | INTRAVENOUS | Status: DC | PRN

## 2020-09-20 MED ORDER — VERAPAMIL HCL 2.5 MG/ML IV SOLN
INTRAVENOUS | Status: AC
  Filled 2020-09-20: qty 2

## 2020-09-20 MED ORDER — HEPARIN SODIUM (PORCINE) 1000 UNIT/ML IJ SOLN
INTRAMUSCULAR | Status: AC
  Filled 2020-09-20: qty 1

## 2020-09-20 MED ORDER — IOHEXOL 350 MG/ML SOLN
INTRAVENOUS | Status: DC | PRN
  Administered 2020-09-20: 13:00:00 60 mL via INTRA_ARTERIAL

## 2020-09-20 MED ORDER — LIDOCAINE HCL (PF) 1 % IJ SOLN
INTRAMUSCULAR | Status: AC
  Filled 2020-09-20: qty 30

## 2020-09-20 MED ORDER — SODIUM CHLORIDE 0.9 % WEIGHT BASED INFUSION
3.0000 mL/kg/h | INTRAVENOUS | Status: AC
  Administered 2020-09-20: 11:00:00 3 mL/kg/h via INTRAVENOUS

## 2020-09-20 MED ORDER — NITROGLYCERIN 1 MG/10 ML FOR IR/CATH LAB
INTRA_ARTERIAL | Status: AC
  Filled 2020-09-20: qty 10

## 2020-09-20 MED ORDER — SODIUM CHLORIDE 0.9% FLUSH: 3.0000 mL | INTRAVENOUS | Status: DC | PRN

## 2020-09-20 MED ORDER — SODIUM CHLORIDE 0.9 % WEIGHT BASED INFUSION: 1.0000 mL/kg/h | INTRAVENOUS | Status: DC

## 2020-09-20 MED ORDER — HEPARIN SODIUM (PORCINE) 1000 UNIT/ML IJ SOLN
INTRAMUSCULAR | Status: DC | PRN
  Administered 2020-09-20: 3500 [IU] via INTRAVENOUS

## 2020-09-20 MED ORDER — MORPHINE SULFATE (PF) 2 MG/ML IV SOLN: 2.0000 mg | INTRAVENOUS | Status: DC | PRN

## 2020-09-20 MED ORDER — HEPARIN (PORCINE) IN NACL 1000-0.9 UT/500ML-% IV SOLN
INTRAVENOUS | Status: DC | PRN
  Administered 2020-09-20 (×2): 500 mL

## 2020-09-20 MED ORDER — MIDAZOLAM HCL 2 MG/2ML IJ SOLN
INTRAMUSCULAR | Status: AC
  Filled 2020-09-20: qty 2

## 2020-09-20 MED ORDER — HYDRALAZINE HCL 20 MG/ML IJ SOLN: 10.0000 mg | INTRAMUSCULAR | Status: DC | PRN

## 2020-09-20 MED ORDER — ONDANSETRON HCL 4 MG/2ML IJ SOLN: 4.0000 mg | Freq: Four times a day (QID) | INTRAMUSCULAR | Status: DC | PRN

## 2020-09-20 MED ORDER — SODIUM CHLORIDE 0.9 % IV SOLN: INTRAVENOUS | Status: DC

## 2020-09-20 MED ORDER — VERAPAMIL HCL 2.5 MG/ML IV SOLN
INTRA_ARTERIAL | Status: DC | PRN
  Administered 2020-09-20: 13:00:00 10 mL via INTRA_ARTERIAL

## 2020-09-20 MED ORDER — FENTANYL CITRATE (PF) 100 MCG/2ML IJ SOLN
INTRAMUSCULAR | Status: DC | PRN
  Administered 2020-09-20: 25 ug via INTRAVENOUS

## 2020-09-20 MED ORDER — HEPARIN (PORCINE) IN NACL 1000-0.9 UT/500ML-% IV SOLN
INTRAVENOUS | Status: AC
  Filled 2020-09-20: qty 500

## 2020-09-20 MED ORDER — LABETALOL HCL 5 MG/ML IV SOLN: 10.0000 mg | INTRAVENOUS | Status: DC | PRN

## 2020-09-20 MED ORDER — SODIUM CHLORIDE 0.9% FLUSH: 3.0000 mL | Freq: Two times a day (BID) | INTRAVENOUS | Status: DC

## 2020-09-20 MED ORDER — LIDOCAINE HCL (PF) 1 % IJ SOLN
INTRAMUSCULAR | Status: DC | PRN
  Administered 2020-09-20: 2 mL via INTRADERMAL

## 2020-09-20 MED ORDER — HEPARIN (PORCINE) IN NACL 1000-0.9 UT/500ML-% IV SOLN
INTRAVENOUS | Status: AC
  Filled 2020-09-20: qty 1000

## 2020-09-20 SURGICAL SUPPLY — 13 items
CATH INFINITI 5FR ANG PIGTAIL (CATHETERS) ×1 IMPLANT
CATH INFINITI JR4 5F (CATHETERS) ×1 IMPLANT
CATH OPTITORQUE TIG 4.0 5F (CATHETERS) ×1 IMPLANT
DEVICE RAD COMP TR BAND LRG (VASCULAR PRODUCTS) ×1 IMPLANT
GLIDESHEATH SLEND A-KIT 6F 22G (SHEATH) ×1 IMPLANT
GUIDEWIRE INQWIRE 1.5J.035X260 (WIRE) IMPLANT
INQWIRE 1.5J .035X260CM (WIRE) ×2
KIT HEART LEFT (KITS) ×2 IMPLANT
PACK CARDIAC CATHETERIZATION (CUSTOM PROCEDURE TRAY) ×2 IMPLANT
SYR MEDRAD MARK 7 150ML (SYRINGE) ×2 IMPLANT
TRANSDUCER W/STOPCOCK (MISCELLANEOUS) ×2 IMPLANT
TUBING CIL FLEX 10 FLL-RA (TUBING) ×2 IMPLANT
WIRE HI TORQ VERSACORE-J 145CM (WIRE) ×1 IMPLANT

## 2020-09-20 NOTE — Discharge Instructions (Signed)
Drink plenty of fluid for 48 hours and keep wrist elevated at heart level for 24 hours  Radial Site Care   This sheet gives you information about how to care for yourself after your procedure. Your health care provider may also give you more specific instructions. If you have problems or questions, contact your health care provider. What can I expect after the procedure? After the procedure, it is common to have:  Bruising and tenderness at the catheter insertion area. Follow these instructions at home: Medicines  Take over-the-counter and prescription medicines only as told by your health care provider. Insertion site care 1. Follow instructions from your health care provider about how to take care of your insertion site. Make sure you: ? Wash your hands with soap and water before you change your bandage (dressing). If soap and water are not available, use hand sanitizer. ? remove your dressing as told by your health care provider. In 24 hours 2. Check your insertion site every day for signs of infection. Check for: ? Redness, swelling, or pain. ? Fluid or blood. ? Pus or a bad smell. ? Warmth. 3. Do not take baths, swim, or use a hot tub until your health care provider approves. 4. You may shower 24-48 hours after the procedure, or as directed by your health care provider. ? Remove the dressing and gently wash the site with plain soap and water. ? Pat the area dry with a clean towel. ? Do not rub the site. That could cause bleeding. 5. Do not apply powder or lotion to the site. Activity   1. For 24 hours after the procedure, or as directed by your health care provider: ? Do not flex or bend the affected arm. ? Do not push or pull heavy objects with the affected arm. ? Do not drive yourself home from the hospital or clinic. You may drive 24 hours after the procedure unless your health care provider tells you not to. ? Do not operate machinery or power tools. 2. Do not lift  anything that is heavier than 10 lb (4.5 kg), or the limit that you are told, until your health care provider says that it is safe. For 4 days 3. Ask your health care provider when it is okay to: ? Return to work or school. ? Resume usual physical activities or sports. ? Resume sexual activity. General instructions  If the catheter site starts to bleed, raise your arm and put firm pressure on the site. If the bleeding does not stop, get help right away. This is a medical emergency.  If you went home on the same day as your procedure, a responsible adult should be with you for the first 24 hours after you arrive home.  Keep all follow-up visits as told by your health care provider. This is important. Contact a health care provider if:  You have a fever.  You have redness, swelling, or yellow drainage around your insertion site. Get help right away if:  You have unusual pain at the radial site.  The catheter insertion area swells very fast.  The insertion area is bleeding, and the bleeding does not stop when you hold steady pressure on the area.  Your arm or hand becomes pale, cool, tingly, or numb. These symptoms may represent a serious problem that is an emergency. Do not wait to see if the symptoms will go away. Get medical help right away. Call your local emergency services (911 in the [place] Do not   drive yourself to the hospital. Summary  After the procedure, it is common to have bruising and tenderness at the site.  Follow instructions from your health care provider about how to take care of your radial site wound. Check the wound every day for signs of infection.  Do not lift anything that is heavier than 10 lb (4.5 kg), or the limit that you are told, until your health care provider says that it is safe. This information is not intended to replace advice given to you by your health care provider. Make sure you discuss any questions you have with your health care  provider. Document Revised: 10/28/2017 Document Reviewed: 10/28/2017 Elsevier Patient Education  2020 Elsevier Inc.  

## 2020-09-20 NOTE — Interval H&P Note (Signed)
Cath Lab Visit (complete for each Cath Lab visit)  Clinical Evaluation Leading to the Procedure:   ACS: No.  Non-ACS:    Anginal Classification: CCS II  Anti-ischemic medical therapy: Minimal Therapy (1 class of medications)  Non-Invasive Test Results: No non-invasive testing performed  Prior CABG: No previous CABG      History and Physical Interval Note:  09/20/2020 12:36 PM  Alicia [name]  has presented today for surgery, with the diagnosis of chest pain exertional.  The various methods of treatment have been discussed with the patient and family. After consideration of risks, benefits and other options for treatment, the patient has consented to  Procedure(s): LEFT HEART CATH AND CORONARY ANGIOGRAPHY (N/A) as a surgical intervention.  The patient's history has been reviewed, patient examined, no change in status, stable for surgery.  I have reviewed the patient's chart and labs.  Questions were answered to the patient's satisfaction.     Quay Burow

## 2020-09-21 ENCOUNTER — Encounter (HOSPITAL_COMMUNITY): Payer: Self-pay | Admitting: Cardiovascular Disease

## 2020-10-08 ENCOUNTER — Telehealth (HOSPITAL_COMMUNITY): Payer: Self-pay | Admitting: Cardiovascular Disease

## 2020-10-08 NOTE — Telephone Encounter (Signed)
Patient cancelled echocardiogram see below:  09/12/2020 2:43 PM 10/08/2020 9:42 AM By: By: Maryann Conners A   Cancel Rsn: Patient (no need for appt)    Order will be removed from the WQ.

## 2020-10-11 ENCOUNTER — Other Ambulatory Visit (HOSPITAL_COMMUNITY): Payer: Medicare Other

## 2020-10-12 DIAGNOSIS — F411 Generalized anxiety disorder: Secondary | ICD-10-CM | POA: Diagnosis not present

## 2020-10-12 DIAGNOSIS — I10 Essential (primary) hypertension: Secondary | ICD-10-CM | POA: Diagnosis not present

## 2020-10-12 DIAGNOSIS — D692 Other nonthrombocytopenic purpura: Secondary | ICD-10-CM | POA: Diagnosis not present

## 2020-10-12 DIAGNOSIS — G2581 Restless legs syndrome: Secondary | ICD-10-CM | POA: Diagnosis not present

## 2020-10-12 DIAGNOSIS — I700 Atherosclerosis of aorta: Secondary | ICD-10-CM | POA: Diagnosis not present

## 2020-10-12 DIAGNOSIS — E7800 Pure hypercholesterolemia, unspecified: Secondary | ICD-10-CM | POA: Diagnosis not present

## 2020-10-12 DIAGNOSIS — M1990 Unspecified osteoarthritis, unspecified site: Secondary | ICD-10-CM | POA: Diagnosis not present

## 2020-10-12 DIAGNOSIS — G479 Sleep disorder, unspecified: Secondary | ICD-10-CM | POA: Diagnosis not present

## 2020-10-12 DIAGNOSIS — Z0000 Encounter for general adult medical examination without abnormal findings: Secondary | ICD-10-CM | POA: Diagnosis not present

## 2020-10-12 DIAGNOSIS — K219 Gastro-esophageal reflux disease without esophagitis: Secondary | ICD-10-CM | POA: Diagnosis not present

## 2020-10-18 DIAGNOSIS — Z1231 Encounter for screening mammogram for malignant neoplasm of breast: Secondary | ICD-10-CM | POA: Diagnosis not present

## 2020-10-30 ENCOUNTER — Telehealth: Payer: Self-pay | Admitting: Cardiovascular Disease

## 2020-10-30 NOTE — Telephone Encounter (Signed)
Left message to call back  Routed to [facility] to review echo order necessity - patient had heart cath

## 2020-10-30 NOTE — Telephone Encounter (Signed)
Patient called and stated that the echocardiogram will have to be recoded for the insurance to pay for it from Dr. Kennon Holter office. Please call back to confirm

## 2020-10-31 NOTE — Telephone Encounter (Signed)
Spoke with pt on the phone. Advised pt that we could cancel the echocardiogram since her EF was normal via the heart cath that was done in Dec. 2021.  Pt states that this is not what she was asking about. Pt states that an echo that was done back in June or July of 2021 was not covered by her insurance.  I have done a thorough chart review and can not find an echo that was ordered by one of our physicians for that time period.  Will follow up with pt to confirm whom might have ordered the study.

## 2020-11-13 ENCOUNTER — Other Ambulatory Visit: Payer: Self-pay

## 2020-11-13 ENCOUNTER — Encounter: Payer: Self-pay | Admitting: Cardiovascular Disease

## 2020-11-13 ENCOUNTER — Ambulatory Visit (INDEPENDENT_AMBULATORY_CARE_PROVIDER_SITE_OTHER): Payer: Medicare Other | Admitting: Cardiovascular Disease

## 2020-11-13 DIAGNOSIS — E785 Hyperlipidemia, unspecified: Secondary | ICD-10-CM | POA: Diagnosis not present

## 2020-11-13 DIAGNOSIS — I10 Essential (primary) hypertension: Secondary | ICD-10-CM

## 2020-11-13 DIAGNOSIS — R079 Chest pain, unspecified: Secondary | ICD-10-CM | POA: Diagnosis not present

## 2020-11-13 NOTE — Assessment & Plan Note (Signed)
History of dyslipidemia on statin therapy with lipid profile performed 10/12/2020 revealing total cholesterol 139, LDL 70 and HDL of 51.

## 2020-11-13 NOTE — Patient Instructions (Signed)

## 2020-11-13 NOTE — Assessment & Plan Note (Signed)
History of essential hypertension blood pressure measured today 117/62.  She is on amlodipine and Avapro as well as metoprolol.

## 2020-11-13 NOTE — Assessment & Plan Note (Signed)
History of exertional chest pain with positive risk factors leading to a outpatient radial diagnostic cath performed by myself 09/20/2020 which was entirely normal.  She had normal coronaries and normal LV function.  I reassured her that in all likelihood her chest pain is not cardiovascular in nature.

## 2020-11-13 NOTE — Progress Notes (Signed)
11/13/2020 Alicia [name]   1941/05/09  [id number]  Primary Physician Gaynelle Arabian, MD Primary Cardiologist: Lorretta Harp MD Lupe Carney, [place]  HPI:  Alicia [name] is a 80 y.o.  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  She was referred by Dr. Alroy Dust, her primary provider, for evaluation of exertional chest pain.  I last saw her in the office 09/12/2020. Her risk factor profile is notable for 15 to 20 pack years of tobacco abuse having quit 25 years ago, treated hypertension and hyperlipidemia.  Both of her parents died of myocardial infarction's.  She is never had a heart attack or stroke.  She does have RLS and GERD.  She is complained of new onset exertional chest pain for last 3 to 4 months that occurs with walking associated shortness of breath and upper extremity radiation.  I performed outpatient radial diagnostic coronary angiography 09/20/2020 which was entirely normal.  She continues to have symptoms of chest pain which may be related to her reflux.   Current Meds  Medication Sig  . acetaminophen (TYLENOL) 650 MG CR tablet Take 1,300 mg by mouth every 8 (eight) hours as needed for pain.  Marland Kitchen amLODipine (NORVASC) 5 MG tablet Take 2.5 mg by mouth daily.  Marland Kitchen Bioflavonoid Products (BIOFLEX PO) Take 1 tablet by mouth daily.  . celecoxib (CELEBREX) 200 MG capsule Take 30 mg by mouth daily.  . Flax Oil-Fish Oil-Borage Oil (FISH-FLAX-BORAGE PO) Take 1 capsule by mouth daily.  . irbesartan (AVAPRO) 300 MG tablet Take 300 mg by mouth daily.  . isosorbide mononitrate (IMDUR) 30 MG 24 hr tablet Take 0.5 tablets (15 mg total) by mouth daily.  Marland Kitchen LORazepam (ATIVAN) 0.5 MG tablet Take 0.5 mg by mouth 2 (two) times daily as needed for anxiety.  . meclizine (ANTIVERT) 25 MG tablet Take 25 mg by mouth 3 (three) times daily as needed for dizziness.  . metoprolol  succinate (TOPROL-XL) 50 MG 24 hr tablet Take 50 mg by mouth daily.  . Multiple Minerals-Vitamins (CAL MAG ZINC +D3 PO) Take 1 tablet by mouth daily.  . multivitamin-lutein (OCUVITE-LUTEIN) CAPS Take 830 capsules by mouth daily.  Marland Kitchen omeprazole (PRILOSEC) 40 MG capsule Take 40 mg by mouth daily.  Marland Kitchen rOPINIRole (REQUIP) 1 MG tablet Take 1 tablet (1 mg total) by mouth 3 (three) times daily. (Patient taking differently: Take 1-2 mg by mouth See admin instructions. Take 1 tablet (1 mg) by mouth in the morning & take 2 tablets (2 mg) by mouth at night.)  . simvastatin (ZOCOR) 20 MG tablet Take 20 mg by mouth every evening.  Marland Kitchen spironolactone (ALDACTONE) 25 MG tablet Take 25 mg by mouth daily.  . traZODone (DESYREL) 50 MG tablet Take 50 mg by mouth at bedtime.   . [DISCONTINUED] metoprolol tartrate (LOPRESSOR) 50 MG tablet Take 2 hours prior to procedure.   Current Facility-Administered Medications for the 11/13/20 encounter (Office Visit) with Lorretta Harp, MD  Medication  . sodium chloride flush (NS) 0.9 % injection 3 mL     Allergies  Allergen Reactions  . Ibuprofen Other (See Comments)    GI UPSET  . Lexapro [Escitalopram Oxalate] Other (See Comments)    GERD  . Lisinopril Cough  . Mobic [Meloxicam] Other (See Comments)    LEG PAIN  . Nabumetone Other (See Comments)    GI UPSET  .  Prilosec Otc [Omeprazole Magnesium] Other (See Comments)    ARM SPASM  . Zoloft [Sertraline Hcl] Other (See Comments)    INSOMNIA  . Sulfa Antibiotics Rash    Social History   Socioeconomic History  . Marital status: Widowed    Spouse name: Not on file  . Number of children: Not on file  . Years of education: Not on file  . Highest education level: Not on file  Occupational History  . Not on file  Tobacco Use  . Smoking status: Former Smoker    Packs/day: 0.50    Years: 20.00    Pack years: 10.00    Types: Cigarettes    Quit date: 10/06/1996    Years since quitting: 24.1  . Smokeless tobacco:  Never Used  Vaping Use  . Vaping Use: Never used  Substance and Sexual Activity  . Alcohol use: No    Alcohol/week: 0.0 standard drinks  . Drug use: No  . Sexual activity: Not on file  Other Topics Concern  . Not on file  Social History Narrative   Lives alone in a one story home.  Has 3 children.     Semi retired.     Worked in [place] [address] for [employer].  Education: high school.   Social Determinants of Health   Financial Resource Strain: Not on file  Food Insecurity: Not on file  Transportation Needs: Not on file  Physical Activity: Not on file  Stress: Not on file  Social Connections: Not on file  Intimate Partner Violence: Not on file     Review of Systems: General: negative for chills, fever, night sweats or weight changes.  Cardiovascular: negative for chest pain, dyspnea on exertion, edema, orthopnea, palpitations, paroxysmal nocturnal dyspnea or shortness of breath Dermatological: negative for rash Respiratory: negative for cough or wheezing Urologic: negative for hematuria Abdominal: negative for nausea, vomiting, diarrhea, bright red blood per rectum, melena, or hematemesis Neurologic: negative for visual changes, syncope, or dizziness All other systems reviewed and are otherwise negative except as noted above.    Blood pressure 117/62, pulse 66, height 5\' 2"  (1.575 m), weight 152 lb 9.6 oz (69.2 kg), SpO2 96 %.  General appearance: alert and no distress Neck: no adenopathy, no carotid bruit, no JVD, supple, symmetrical, trachea midline and thyroid not enlarged, symmetric, no tenderness/mass/nodules Lungs: clear to auscultation bilaterally Heart: regular rate and rhythm, S1, S2 normal, no murmur, click, rub or gallop Extremities: extremities normal, atraumatic, no cyanosis or edema Pulses: 2+ and symmetric Skin: Skin color, texture, turgor normal. No rashes or lesions Neurologic: Alert and oriented X 3, normal strength and tone. Normal symmetric reflexes. Normal  coordination and gait  EKG not performed today  ASSESSMENT AND PLAN:   Essential hypertension History of essential hypertension blood pressure measured today 117/62.  She is on amlodipine and Avapro as well as metoprolol.  Dyslipidemia History of dyslipidemia on statin therapy with lipid profile performed 10/12/2020 revealing total cholesterol 139, LDL 70 and HDL of 51.  Chest pain of uncertain etiology History of exertional chest pain with positive risk factors leading to a outpatient radial diagnostic cath performed by myself 09/20/2020 which was entirely normal.  She had normal coronaries and normal LV function.  I reassured her that in all likelihood her chest pain is not cardiovascular in nature.      Lorretta Harp MD FACP,FACC,FAHA, [facility] 11/13/2020 11:48 AM

## 2020-12-18 NOTE — Progress Notes (Signed)
Office Visit Note  Patient: Alicia [name]             Date of Birth: 10/20/1940           MRN: [id number]             PCP: Gaynelle Arabian, MD Referring: Gaynelle Arabian, MD Visit Date: 01/01/2021 Occupation: @GUAROCC @  Subjective:  Other (Right hip pain-requested injection )   History of Present Illness: Alicia [name] is a 80 y.o. female with history of osteoarthritis.  She continues to have some stiffness and discomfort in her hands.  She states about a month ago she started having pain in the right trochanteric bursa radiating into her right lower extremity.  She was having difficulty sleeping on the right side.  The pain has gradually eased off but not completely resolved.  She does not have time for physical therapy at this point.  She would like to have a cortisone injection in the right trochanteric bursa.  Activities of Daily Living:  Patient reports morning stiffness for 30  minutes.   Patient Reports nocturnal pain.  Difficulty dressing/grooming: Denies Difficulty climbing stairs: Denies Difficulty getting out of chair: Denies Difficulty using hands for taps, buttons, cutlery, and/or writing: Reports  Review of Systems  Constitutional: Positive for fatigue.  HENT: Positive for mouth dryness. Negative for mouth sores and nose dryness.   Eyes: Positive for itching. Negative for pain and dryness.  Respiratory: Positive for shortness of breath. Negative for difficulty breathing.   Cardiovascular: Negative for chest pain and palpitations.  Gastrointestinal: Positive for diarrhea. Negative for blood in stool and constipation.  Endocrine: Negative for increased urination.  Genitourinary: Negative for difficulty urinating.  Musculoskeletal: Positive for arthralgias, joint pain, myalgias, muscle weakness, morning stiffness, muscle tenderness and myalgias. Negative for joint swelling.  Skin: Negative for color change, rash and redness.  Allergic/Immunologic: Negative for  susceptible to infections.  Neurological: Negative for dizziness, numbness, headaches, memory loss and weakness.  Hematological: Negative for bruising/bleeding tendency.  Psychiatric/Behavioral: Negative for confusion.    PMFS History:  Patient Active Problem List   Diagnosis Date Noted  . Family history of heart disease 09/12/2020  . Chest pain of uncertain etiology 53/29/9242  . Essential hypertension 10/12/2018  . Dyslipidemia 10/12/2018  . History of kidney stones 10/12/2018  . History of gastroesophageal reflux (GERD) 10/12/2018  . History of anxiety 10/12/2018  . Osteopenia of multiple sites 10/12/2018  . Diverticulosis 10/12/2018  . Chronic constipation 10/12/2018  . Primary osteoarthritis of both hands 10/12/2018  . RLS (restless legs syndrome) 03/28/2016    Past Medical History:  Diagnosis Date  . Anxiety   . Bursitis   . GERD (gastroesophageal reflux disease)   . Hypercholesteremia   . Hypertension   . Insomnia   . Kidney stone   . Osteoarthritis   . Osteopenia   . Restless leg syndrome   . RLS (restless legs syndrome) 03/28/2016    Family History  Problem Relation Age of Onset  . Heart attack Mother   . Depression Mother   . Stroke Mother   . Diabetes Mother   . Heart attack Father   . COPD Sister   . COPD Brother   . COPD Brother   . COPD Sister   . Restless legs syndrome Son   . Restless legs syndrome Son   . Restless legs syndrome Daughter    Past Surgical History:  Procedure Laterality Date  . ABDOMINAL HYSTERECTOMY    .  APPENDECTOMY    . BLEPHAROPLASTY  2019  . CATARACT EXTRACTION Right 02/2010  . CHOLECYSTECTOMY    . LEFT HEART CATH AND CORONARY ANGIOGRAPHY N/A 09/20/2020   Procedure: LEFT HEART CATH AND CORONARY ANGIOGRAPHY;  Surgeon: Lorretta Harp, MD;  Location: [facility];  Service: Cardiovascular;  Laterality: N/A;  . ROTATOR CUFF REPAIR Right    Social History   Social History Narrative   Lives alone in a one story  home.  Has 3 children.     Semi retired.     Worked in [place] for [employer].  Education: high school.   Immunization History  Administered Date(s) Administered  . Moderna Sars-Covid-2 Vaccination 10/19/2019, 11/15/2019, 08/13/2020     Objective: Vital Signs: BP 120/75 (BP Location: Left Arm, Patient Position: Sitting, Cuff Size: Normal)   Pulse 60   Resp 13   Ht 5\' 2"  (1.575 m)   Wt 154 lb 12.8 oz (70.2 kg)   BMI 28.31 kg/m    Physical Exam Vitals and nursing note reviewed.  Constitutional:      Appearance: She is well-developed.  HENT:     Head: Normocephalic and atraumatic.  Eyes:     Conjunctiva/sclera: Conjunctivae normal.  Cardiovascular:     Rate and Rhythm: Normal rate and regular rhythm.     Heart sounds: Normal heart sounds.  Pulmonary:     Effort: Pulmonary effort is normal.     Breath sounds: Normal breath sounds.  Abdominal:     General: Bowel sounds are normal.     Palpations: Abdomen is soft.  Musculoskeletal:     Cervical back: Normal range of motion.  Lymphadenopathy:     Cervical: No cervical adenopathy.  Skin:    General: Skin is warm and dry.     Capillary Refill: Capillary refill takes less than 2 seconds.  Neurological:     Mental Status: She is alert and oriented to person, place, and time.  Psychiatric:        Behavior: Behavior normal.      Musculoskeletal Exam: C-spine thoracic and lumbar spine were in good range of motion.  Shoulder joints, elbow joints, wrist joints, MCPs PIPs and DIPs with good range of motion with no synovitis.  She has bilateral PIP and DIP thickening.  Hip joints in good range of motion.  She had tenderness on palpation of right trochanteric bursa.  Knee joints, ankles, MTPs were in good range of motion with no synovitis.  CDAI Exam: CDAI Score: - Patient Global: -; Provider Global: - Swollen: -; Tender: - Joint Exam 01/01/2021   No joint exam has been documented for this visit   There is currently no information  documented on the homunculus. Go to the Rheumatology activity and complete the homunculus joint exam.  Investigation: No additional findings.  Imaging: No results found.  Recent Labs: Lab Results  Component Value Date   WBC 7.7 09/20/2020   HGB 13.8 09/20/2020   PLT 336 09/20/2020   NA 136 09/20/2020   K 4.2 09/20/2020   CL 101 09/20/2020   CO2 23 09/20/2020   GLUCOSE 104 (H) 09/20/2020   BUN 35 (H) 09/20/2020   CREATININE 1.01 (H) 09/20/2020   BILITOT 0.6 10/08/2018   ALKPHOS 76 10/08/2018   AST 20 10/08/2018   ALT 15 10/08/2018   PROT 7.3 10/08/2018   ALBUMIN 3.8 10/08/2018   CALCIUM 10.1 09/20/2020   GFRAA >60 10/08/2018    Speciality Comments: No specialty comments available.  Procedures:  Large Joint Inj: R greater trochanter on 01/01/2021 1:53 PM Indications: pain Details: 27 G 1.5 in needle, lateral approach  Arthrogram: No  Medications: 40 mg triamcinolone acetonide 40 MG/ML; 1.5 mL lidocaine 1 % Aspirate: 0 mL Outcome: tolerated well, no immediate complications Procedure, treatment alternatives, risks and benefits explained, specific risks discussed. Consent was given by the patient. Immediately prior to procedure a time out was called to verify the correct patient, procedure, equipment, support staff and site/side marked as required. Patient was prepped and draped in the usual sterile fashion.     Allergies: Ibuprofen, Lexapro [escitalopram oxalate], Lisinopril, Mobic [meloxicam], Nabumetone, Prilosec otc [omeprazole magnesium], Zoloft [sertraline hcl], and Sulfa antibiotics   Assessment / Plan:     Visit Diagnoses: Primary osteoarthritis of both hands-she has bilateral PIP and DIP thickening consistent with osteoarthritis.  Joint protection was discussed.  Primary osteoarthritis of left hip-she had no discomfort with range of motion.  Trochanteric bursitis of both hips - bilateral cortisone injections on 07/03/2020.  She has recurrence of right  trochanteric bursitis.  Per her request after informed consent was obtained right trochanteric bursa was injected with cortisone as described above.  She tolerated the procedure well.  I offered physical therapy which she declined.  She stated that she will contact us later for PT.  A handout on stretching exercises was given.  Osteopenia of multiple sites - She has been getting DEXA scan through her PCP.  I do not have the results available.  Other medical problems are listed as follows:  Chronic constipation  Dyslipidemia  RLS (restless legs syndrome)  History of gastroesophageal reflux (GERD)  History of anxiety  Essential hypertension  Diverticulosis  History of kidney stones  Orders: Orders Placed This Encounter  Procedures  . Large Joint Inj   No orders of the defined types were placed in this encounter.    Follow-Up Instructions: Return in about 6 months (around 07/04/2021) for Osteoarthritis.   Bo Merino, MD  Note - This record has been created using Editor, commissioning.  Chart creation errors have been sought, but may not always  have been located. Such creation errors do not reflect on  the standard of medical care.

## 2021-01-01 ENCOUNTER — Other Ambulatory Visit: Payer: Self-pay

## 2021-01-01 ENCOUNTER — Ambulatory Visit (INDEPENDENT_AMBULATORY_CARE_PROVIDER_SITE_OTHER): Payer: Medicare Other | Admitting: Rheumatology

## 2021-01-01 ENCOUNTER — Encounter: Payer: Self-pay | Admitting: Rheumatology

## 2021-01-01 VITALS — BP 120/75 | HR 60 | Resp 13 | Ht 62.0 in | Wt 154.8 lb

## 2021-01-01 DIAGNOSIS — I10 Essential (primary) hypertension: Secondary | ICD-10-CM

## 2021-01-01 DIAGNOSIS — K5909 Other constipation: Secondary | ICD-10-CM | POA: Diagnosis not present

## 2021-01-01 DIAGNOSIS — M1612 Unilateral primary osteoarthritis, left hip: Secondary | ICD-10-CM

## 2021-01-01 DIAGNOSIS — M7062 Trochanteric bursitis, left hip: Secondary | ICD-10-CM

## 2021-01-01 DIAGNOSIS — E785 Hyperlipidemia, unspecified: Secondary | ICD-10-CM

## 2021-01-01 DIAGNOSIS — Z87442 Personal history of urinary calculi: Secondary | ICD-10-CM | POA: Diagnosis not present

## 2021-01-01 DIAGNOSIS — Z8659 Personal history of other mental and behavioral disorders: Secondary | ICD-10-CM | POA: Diagnosis not present

## 2021-01-01 DIAGNOSIS — M7061 Trochanteric bursitis, right hip: Secondary | ICD-10-CM | POA: Diagnosis not present

## 2021-01-01 DIAGNOSIS — M19041 Primary osteoarthritis, right hand: Secondary | ICD-10-CM | POA: Diagnosis not present

## 2021-01-01 DIAGNOSIS — M19042 Primary osteoarthritis, left hand: Secondary | ICD-10-CM | POA: Diagnosis not present

## 2021-01-01 DIAGNOSIS — M8589 Other specified disorders of bone density and structure, multiple sites: Secondary | ICD-10-CM | POA: Diagnosis not present

## 2021-01-01 DIAGNOSIS — Z8719 Personal history of other diseases of the digestive system: Secondary | ICD-10-CM

## 2021-01-01 DIAGNOSIS — K5790 Diverticulosis of intestine, part unspecified, without perforation or abscess without bleeding: Secondary | ICD-10-CM | POA: Diagnosis not present

## 2021-01-01 DIAGNOSIS — G2581 Restless legs syndrome: Secondary | ICD-10-CM

## 2021-01-01 MED ORDER — LIDOCAINE HCL 1 % IJ SOLN
1.5000 mL | INTRAMUSCULAR | Status: AC | PRN
  Administered 2021-01-01: 1.5 mL

## 2021-01-01 MED ORDER — TRIAMCINOLONE ACETONIDE 40 MG/ML IJ SUSP
40.0000 mg | INTRAMUSCULAR | Status: AC | PRN
  Administered 2021-01-01: 40 mg via INTRA_ARTICULAR

## 2021-01-01 NOTE — Patient Instructions (Signed)
Iliotibial Band Syndrome Rehab Ask your health care provider which exercises are safe for you. Do exercises exactly as told by your health care provider and adjust them as directed. It is normal to feel mild stretching, pulling, tightness, or discomfort as you do these exercises. Stop right away if you feel sudden pain or your pain gets significantly worse. Do not begin these exercises until told by your health care provider. Stretching and range-of-motion exercises These exercises warm up your muscles and joints and improve the movement and flexibility of your hip and pelvis. Quadriceps stretch, prone 1. Lie on your abdomen (prone position) on a firm surface, such as a bed or padded floor. 2. Bend your left / right knee and reach back to hold your ankle or pant leg. If you cannot reach your ankle or pant leg, loop a belt around your foot and grab the belt instead. 3. Gently pull your heel toward your buttocks. Your knee should not slide out to the side. You should feel a stretch in the front of your thigh and knee (quadriceps). 4. Hold this position for __________ seconds. Repeat __________ times. Complete this exercise __________ times a day.   Iliotibial band stretch An iliotibial band is a strong band of muscle tissue that runs from the outer side of your hip to the outer side of your thigh and knee. 1. Lie on your side with your left / right leg in the top position. 2. Bend both of your knees and grab your left / right ankle. Stretch out your bottom arm to help you balance. 3. Slowly bring your top knee back so your thigh goes behind your trunk. 4. Slowly lower your top leg toward the floor until you feel a gentle stretch on the outside of your left / right hip and thigh. If you do not feel a stretch and your knee will not fall farther, place the heel of your other foot on top of your knee and pull your knee down toward the floor with your foot. 5. Hold this position for __________  seconds. Repeat __________ times. Complete this exercise __________ times a day.   Strengthening exercises These exercises build strength and endurance in your hip and pelvis. Endurance is the ability to use your muscles for a long time, even after they get tired. Straight leg raises, side-lying This exercise strengthens the muscles that rotate the leg at the hip and move it away from your body (hip abductors). 1. Lie on your side with your left / right leg in the top position. Lie so your head, shoulder, hip, and knee line up. You may bend your bottom knee to help you balance. 2. Roll your hips slightly forward so your hips are stacked directly over each other and your left / right knee is facing forward. 3. Tense the muscles in your outer thigh and lift your top leg 4-6 inches (10-15 cm). 4. Hold this position for __________ seconds. 5. Slowly lower your leg to return to the starting position. Let your muscles relax completely before doing another repetition. Repeat __________ times. Complete this exercise __________ times a day.   Leg raises, prone This exercise strengthens the muscles that move the hips backward (hip extensors). 1. Lie on your abdomen (prone position) on your bed or a firm surface. You can put a pillow under your hips if that is more comfortable for your lower back. 2. Bend your left / right knee so your foot is straight up in the air.   3. Squeeze your buttocks muscles and lift your left / right thigh off the bed. Do not let your back arch. 4. Tense your thigh muscle as hard as you can without increasing any knee pain. 5. Hold this position for __________ seconds. 6. Slowly lower your leg to return to the starting position and allow it to relax completely. Repeat __________ times. Complete this exercise __________ times a day. Hip hike 1. Stand sideways on a bottom step. Stand on your left / right leg with your other foot unsupported next to the step. You can hold on to a  railing or wall for balance if needed. 2. Keep your knees straight and your torso square. Then lift your left / right hip up toward the ceiling. 3. Slowly let your left / right hip lower toward the floor, past the starting position. Your foot should get closer to the floor. Do not lean or bend your knees. Repeat __________ times. Complete this exercise __________ times a day. This information is not intended to replace advice given to you by your health care provider. Make sure you discuss any questions you have with your health care provider. Document Revised: 11/30/2019 Document Reviewed: 11/30/2019 Elsevier Patient Education  2021 Elsevier Inc.  

## 2021-03-06 DIAGNOSIS — S46819A Strain of other muscles, fascia and tendons at shoulder and upper arm level, unspecified arm, initial encounter: Secondary | ICD-10-CM | POA: Diagnosis not present

## 2021-04-04 DIAGNOSIS — Z23 Encounter for immunization: Secondary | ICD-10-CM | POA: Diagnosis not present

## 2021-04-09 DIAGNOSIS — D692 Other nonthrombocytopenic purpura: Secondary | ICD-10-CM | POA: Diagnosis not present

## 2021-04-09 DIAGNOSIS — K219 Gastro-esophageal reflux disease without esophagitis: Secondary | ICD-10-CM | POA: Diagnosis not present

## 2021-04-09 DIAGNOSIS — M899 Disorder of bone, unspecified: Secondary | ICD-10-CM | POA: Diagnosis not present

## 2021-04-09 DIAGNOSIS — G479 Sleep disorder, unspecified: Secondary | ICD-10-CM | POA: Diagnosis not present

## 2021-04-09 DIAGNOSIS — F411 Generalized anxiety disorder: Secondary | ICD-10-CM | POA: Diagnosis not present

## 2021-04-09 DIAGNOSIS — G2581 Restless legs syndrome: Secondary | ICD-10-CM | POA: Diagnosis not present

## 2021-04-09 DIAGNOSIS — M1990 Unspecified osteoarthritis, unspecified site: Secondary | ICD-10-CM | POA: Diagnosis not present

## 2021-04-09 DIAGNOSIS — I700 Atherosclerosis of aorta: Secondary | ICD-10-CM | POA: Diagnosis not present

## 2021-04-09 DIAGNOSIS — I10 Essential (primary) hypertension: Secondary | ICD-10-CM | POA: Diagnosis not present

## 2021-04-09 DIAGNOSIS — E7800 Pure hypercholesterolemia, unspecified: Secondary | ICD-10-CM | POA: Diagnosis not present

## 2021-04-09 DIAGNOSIS — N183 Chronic kidney disease, stage 3 unspecified: Secondary | ICD-10-CM | POA: Diagnosis not present

## 2021-04-11 DIAGNOSIS — M8589 Other specified disorders of bone density and structure, multiple sites: Secondary | ICD-10-CM | POA: Diagnosis not present

## 2021-04-11 DIAGNOSIS — Z780 Asymptomatic menopausal state: Secondary | ICD-10-CM | POA: Diagnosis not present

## 2021-04-12 ENCOUNTER — Telehealth: Payer: Self-pay

## 2021-04-12 NOTE — Telephone Encounter (Signed)
Received DEXA results from [facility].  Date of Scan: 04/11/2021 T-score: -1.03 BMD: 0.705 Lowest site measured: left femoral neck   Current Regimen:calcium, vitamin D  Recommendation: osteopenia- repeat DEXA in 2 years, recommend calcium, vitamin D and resistive exercises.   Reviewed by:Hazel Sams, PA-C  Next Appointment:  07/02/2021   Advised patient of bone density results and recommendations. Patient verbalized understanding.

## 2021-04-29 DIAGNOSIS — U071 COVID-19: Secondary | ICD-10-CM | POA: Diagnosis not present

## 2021-05-07 DIAGNOSIS — U099 Post covid-19 condition, unspecified: Secondary | ICD-10-CM | POA: Diagnosis not present

## 2021-05-07 DIAGNOSIS — E785 Hyperlipidemia, unspecified: Secondary | ICD-10-CM | POA: Diagnosis not present

## 2021-05-07 DIAGNOSIS — I10 Essential (primary) hypertension: Secondary | ICD-10-CM | POA: Diagnosis not present

## 2021-05-13 DIAGNOSIS — U071 COVID-19: Secondary | ICD-10-CM | POA: Diagnosis not present

## 2021-06-19 NOTE — Progress Notes (Signed)
Office Visit Note  Patient: Alicia [name]             Date of Birth: 1941-01-03           MRN: [id number]             PCP: Gaynelle Arabian, MD Referring: Gaynelle Arabian, MD Visit Date: 07/02/2021 Occupation: '@GUAROCC'$ @  Subjective:  Pain in both hips.   History of Present Illness: Alicia [name] is a 80 y.o. female with history of osteoarthritis and osteopenia.  She states she has been experiencing pain and discomfort in the bilateral hips especially at night.  Pain is also in the morning.  She has some difficulty getting up from the sitting position.  None of the other joints are painful.  She has some stiffness in her hands which is manageable.  Activities of Daily Living:  Patient reports morning stiffness for 0 minute.   Patient Denies nocturnal pain.  Difficulty dressing/grooming: Denies Difficulty climbing stairs: Denies Difficulty getting out of chair: Reports Difficulty using hands for taps, buttons, cutlery, and/or writing: Denies  Review of Systems  Constitutional:  Positive for fatigue. Negative for night sweats, weight gain and weight loss.  HENT:  Negative for mouth sores, trouble swallowing, trouble swallowing, mouth dryness and nose dryness.   Eyes:  Negative for pain, redness, visual disturbance and dryness.  Respiratory:  Negative for cough, shortness of breath and difficulty breathing.   Cardiovascular:  Negative for chest pain, palpitations, hypertension, irregular heartbeat and swelling in legs/feet.  Gastrointestinal:  Negative for blood in stool, constipation and diarrhea.  Endocrine: Negative for increased urination.  Genitourinary:  Negative for vaginal dryness.  Musculoskeletal:  Positive for joint pain and joint pain. Negative for joint swelling, myalgias, muscle weakness, morning stiffness, muscle tenderness and myalgias.  Skin:  Negative for color change, rash, hair loss, skin tightness, ulcers and sensitivity to sunlight.  Allergic/Immunologic:  Negative for susceptible to infections.  Neurological:  Negative for dizziness, memory loss, night sweats and weakness.  Hematological:  Negative for swollen glands.  Psychiatric/Behavioral:  Positive for sleep disturbance. Negative for depressed mood. The patient is not nervous/anxious.    PMFS History:  Patient Active Problem List   Diagnosis Date Noted   Primary osteoarthritis of left hip 07/02/2021   Family history of heart disease 09/12/2020   Chest pain of uncertain etiology [id number]   Essential hypertension 10/12/2018   Dyslipidemia 10/12/2018   History of kidney stones 10/12/2018   History of gastroesophageal reflux (GERD) 10/12/2018   History of anxiety 10/12/2018   Osteopenia of multiple sites 10/12/2018   Diverticulosis 10/12/2018   Chronic constipation 10/12/2018   Primary osteoarthritis of both hands 10/12/2018   RLS (restless legs syndrome) 03/28/2016    Past Medical History:  Diagnosis Date   Anxiety    Bursitis    GERD (gastroesophageal reflux disease)    Hypercholesteremia    Hypertension    Insomnia    Kidney stone    Osteoarthritis    Osteopenia    Restless leg syndrome    RLS (restless legs syndrome) 03/28/2016    Family History  Problem Relation Age of Onset   Heart attack Mother    Depression Mother    Stroke Mother    Diabetes Mother    Heart attack Father    COPD Sister    COPD Brother    COPD Brother    COPD Sister    Restless legs syndrome Son    Restless legs syndrome  Son    Restless legs syndrome Daughter    Past Surgical History:  Procedure Laterality Date   ABDOMINAL HYSTERECTOMY     APPENDECTOMY     BLEPHAROPLASTY  2019   CATARACT EXTRACTION Right 02/2010   CHOLECYSTECTOMY     LEFT HEART CATH AND CORONARY ANGIOGRAPHY N/A 09/20/2020   Procedure: LEFT HEART CATH AND CORONARY ANGIOGRAPHY;  Surgeon: Lorretta Harp, MD;  Location: [facility];  Service: Cardiovascular;  Laterality: N/A;   ROTATOR CUFF REPAIR Right     Social History   Social History Narrative   Lives alone in a one story home.  Has 3 children.     Semi retired.     Worked in [place] for [employer].  Education: high school.   Immunization History  Administered Date(s) Administered   [employer] Vaccination 10/19/2019, 11/15/2019, 08/13/2020     Objective: Vital Signs: BP 111/60 (BP Location: Left Arm, Patient Position: Sitting, Cuff Size: Small)   Pulse (!) 56   Resp 12   Ht '5\' 2"'$  (1.575 m)   Wt 154 lb 3.2 oz (69.9 kg)   BMI 28.20 kg/m    Physical Exam Vitals and nursing note reviewed.  Constitutional:      Appearance: She is well-developed.  HENT:     Head: Normocephalic and atraumatic.  Eyes:     Conjunctiva/sclera: Conjunctivae normal.  Cardiovascular:     Rate and Rhythm: Normal rate and regular rhythm.     Heart sounds: Normal heart sounds.  Pulmonary:     Effort: Pulmonary effort is normal.     Breath sounds: Normal breath sounds.  Abdominal:     General: Bowel sounds are normal.     Palpations: Abdomen is soft.  Musculoskeletal:     Cervical back: Normal range of motion.  Lymphadenopathy:     Cervical: No cervical adenopathy.  Skin:    General: Skin is warm and dry.     Capillary Refill: Capillary refill takes less than 2 seconds.  Neurological:     Mental Status: She is alert and oriented to person, place, and time.  Psychiatric:        Behavior: Behavior normal.     Musculoskeletal Exam: C-spine was in good range of motion.  Shoulder joints, elbow joints, wrist joints, MCPs PIPs and DIPs with good range of motion with no synovitis.  She had bilateral PIP and DIP thickening and incomplete fist formation.  She has some limitation range of motion of her hip joints.  She had tenderness on palpation of bilateral trochanteric bursa.  There was no tenderness or swelling in her knee joints.  There was no tenderness over ankles or MTPs.  CDAI Exam: CDAI Score: -- Patient Global: --; Provider Global:  -- Swollen: --; Tender: -- Joint Exam 07/02/2021   No joint exam has been documented for this visit   There is currently no information documented on the homunculus. Go to the Rheumatology activity and complete the homunculus joint exam.  Investigation: No additional findings.  Imaging: No results found.  Recent Labs: Lab Results  Component Value Date   WBC 7.7 09/20/2020   HGB 13.8 09/20/2020   PLT 336 09/20/2020   NA 136 09/20/2020   K 4.2 09/20/2020   CL 101 09/20/2020   CO2 23 09/20/2020   GLUCOSE 104 (H) 09/20/2020   BUN 35 (H) 09/20/2020   CREATININE 1.01 (H) 09/20/2020   BILITOT 0.6 10/08/2018   ALKPHOS 76 10/08/2018   AST 20  10/08/2018   ALT 15 10/08/2018   PROT 7.3 10/08/2018   ALBUMIN 3.8 10/08/2018   CALCIUM 10.1 09/20/2020   GFRAA >60 10/08/2018    Speciality Comments: No specialty comments available.  Procedures:  Large Joint Inj: bilateral greater trochanter on 07/02/2021 1:28 PM Indications: pain Details: 27 G 1.5 in needle, lateral approach  Arthrogram: No  Medications (Right): 1.5 mL lidocaine 1 %; 40 mg triamcinolone acetonide 40 MG/ML Aspirate (Right): 0 mL Medications (Left): 1.5 mL lidocaine 1 %; 40 mg triamcinolone acetonide 40 MG/ML Aspirate (Left): 0 mL Outcome: tolerated well, no immediate complications Procedure, treatment alternatives, risks and benefits explained, specific risks discussed. Consent was given by the patient. Immediately prior to procedure a time out was called to verify the correct patient, procedure, equipment, support staff and site/side marked as required. Patient was prepped and draped in the usual sterile fashion.    Allergies: Ibuprofen, Lexapro [escitalopram oxalate], Lisinopril, Mobic [meloxicam], Nabumetone, Prilosec otc [omeprazole magnesium], Zoloft [sertraline hcl], and Sulfa antibiotics   Assessment / Plan:     Visit Diagnoses: Primary osteoarthritis of both hands-she had bilateral PIP and DIP thickening.   She had incomplete fist formation.  Joint protection was discussed.  Primary osteoarthritis of left hip-she had limited range of motion.  Trochanteric bursitis of both hips-she had severe tenderness on palpation of bilateral trochanteric bursa.  She states she has been doing IT stretches without any relief.  Per her request after informed consent was obtained bilateral trochanteric bursa were injected with cortisone and lidocaine as described above.  She tolerated the procedure well.  I encouraged her to continue to do stretching exercises.  Osteopenia of multiple sites - DEXA 04/11/2021 T-score: -1.03, BMD: 0.705 left femoral neck.  Use of calcium and vitamin D was discussed.  Need for resistive exercises was emphasized.  Essential hypertension-blood pressure is well controlled.  Other medical problems are listed as follows:  Dyslipidemia  History of gastroesophageal reflux (GERD)  Chronic constipation  Diverticulosis  History of kidney stones  RLS (restless legs syndrome)  History of anxiety  Orders: Orders Placed This Encounter  Procedures   Large Joint Inj    No orders of the defined types were placed in this encounter.    Follow-Up Instructions: Return in about 6 months (around 12/30/2021) for Osteoarthritis.   Bo Merino, MD  Note - This record has been created using Editor, commissioning.  Chart creation errors have been sought, but may not always  have been located. Such creation errors do not reflect on  the standard of medical care.

## 2021-06-28 DIAGNOSIS — Z23 Encounter for immunization: Secondary | ICD-10-CM | POA: Diagnosis not present

## 2021-07-02 ENCOUNTER — Ambulatory Visit (INDEPENDENT_AMBULATORY_CARE_PROVIDER_SITE_OTHER): Payer: Medicare Other | Admitting: Rheumatology

## 2021-07-02 ENCOUNTER — Encounter: Payer: Self-pay | Admitting: Rheumatology

## 2021-07-02 ENCOUNTER — Other Ambulatory Visit: Payer: Self-pay

## 2021-07-02 VITALS — BP 111/60 | HR 56 | Resp 12 | Ht 62.0 in | Wt 154.2 lb

## 2021-07-02 DIAGNOSIS — M7061 Trochanteric bursitis, right hip: Secondary | ICD-10-CM | POA: Diagnosis not present

## 2021-07-02 DIAGNOSIS — Z8659 Personal history of other mental and behavioral disorders: Secondary | ICD-10-CM

## 2021-07-02 DIAGNOSIS — M19042 Primary osteoarthritis, left hand: Secondary | ICD-10-CM | POA: Diagnosis not present

## 2021-07-02 DIAGNOSIS — M7062 Trochanteric bursitis, left hip: Secondary | ICD-10-CM | POA: Diagnosis not present

## 2021-07-02 DIAGNOSIS — G2581 Restless legs syndrome: Secondary | ICD-10-CM | POA: Diagnosis not present

## 2021-07-02 DIAGNOSIS — Z87442 Personal history of urinary calculi: Secondary | ICD-10-CM | POA: Diagnosis not present

## 2021-07-02 DIAGNOSIS — K5790 Diverticulosis of intestine, part unspecified, without perforation or abscess without bleeding: Secondary | ICD-10-CM

## 2021-07-02 DIAGNOSIS — Z8719 Personal history of other diseases of the digestive system: Secondary | ICD-10-CM | POA: Diagnosis not present

## 2021-07-02 DIAGNOSIS — K5909 Other constipation: Secondary | ICD-10-CM

## 2021-07-02 DIAGNOSIS — M19041 Primary osteoarthritis, right hand: Secondary | ICD-10-CM

## 2021-07-02 DIAGNOSIS — I10 Essential (primary) hypertension: Secondary | ICD-10-CM | POA: Diagnosis not present

## 2021-07-02 DIAGNOSIS — E785 Hyperlipidemia, unspecified: Secondary | ICD-10-CM | POA: Diagnosis not present

## 2021-07-02 DIAGNOSIS — M8589 Other specified disorders of bone density and structure, multiple sites: Secondary | ICD-10-CM

## 2021-07-02 DIAGNOSIS — M1612 Unilateral primary osteoarthritis, left hip: Secondary | ICD-10-CM | POA: Diagnosis not present

## 2021-07-02 MED ORDER — TRIAMCINOLONE ACETONIDE 40 MG/ML IJ SUSP
40.0000 mg | INTRAMUSCULAR | Status: AC | PRN
  Administered 2021-07-02: 40 mg via INTRA_ARTICULAR

## 2021-07-02 MED ORDER — LIDOCAINE HCL 1 % IJ SOLN
1.5000 mL | INTRAMUSCULAR | Status: AC | PRN
  Administered 2021-07-02: 1.5 mL

## 2021-07-02 MED ORDER — LIDOCAINE HCL 1 % IJ SOLN
1.5000 mL | INTRAMUSCULAR | Status: AC | PRN
Start: 2021-07-02 — End: 2021-07-02
  Administered 2021-07-02: 1.5 mL

## 2021-07-02 NOTE — Patient Instructions (Signed)
Iliotibial Band Syndrome Rehab Ask your health care provider which exercises are safe for you. Do exercises exactly as told by your health care provider and adjust them as directed. It is normal to feel mild stretching, pulling, tightness, or discomfort as you do these exercises. Stop right away if you feel sudden pain or your pain gets significantly worse. Do not begin these exercises until told by your health care provider. Stretching and range-of-motion exercises These exercises warm up your muscles and joints and improve the movement andflexibility of your hip and pelvis. Quadriceps stretch, prone  Lie on your abdomen (prone position) on a firm surface, such as a bed or padded floor. Bend your left / right knee and reach back to hold your ankle or pant leg. If you cannot reach your ankle or pant leg, loop a belt around your foot and grab the belt instead. Gently pull your heel toward your buttocks. Your knee should not slide out to the side. You should feel a stretch in the front of your thigh and knee (quadriceps). Hold this position for __________ seconds. Repeat __________ times. Complete this exercise __________ times a day. Iliotibial band stretch An iliotibial band is a strong band of muscle tissue that runs from the outer side of your hip to the outer side of your thigh and knee. Lie on your side with your left / right leg in the top position. Bend both of your knees and grab your left / right ankle. Stretch out your bottom arm to help you balance. Slowly bring your top knee back so your thigh goes behind your trunk. Slowly lower your top leg toward the floor until you feel a gentle stretch on the outside of your left / right hip and thigh. If you do not feel a stretch and your knee will not fall farther, place the heel of your other foot on top of your knee and pull your knee down toward the floor with your foot. Hold this position for __________ seconds. Repeat __________ times.  Complete this exercise __________ times a day. Strengthening exercises These exercises build strength and endurance in your hip and pelvis. Enduranceis the ability to use your muscles for a long time, even after they get tired. Straight leg raises, side-lying This exercise strengthens the muscles that rotate the leg at the hip and move it away from your body (hip abductors). Lie on your side with your left / right leg in the top position. Lie so your head, shoulder, hip, and knee line up. You may bend your bottom knee to help you balance. Roll your hips slightly forward so your hips are stacked directly over each other and your left / right knee is facing forward. Tense the muscles in your outer thigh and lift your top leg 4-6 inches (10-15 cm). Hold this position for __________ seconds. Slowly lower your leg to return to the starting position. Let your muscles relax completely before doing another repetition. Repeat __________ times. Complete this exercise __________ times a day. Leg raises, prone This exercise strengthens the muscles that move the hips backward (hip extensors). Lie on your abdomen (prone position) on your bed or a firm surface. You can put a pillow under your hips if that is more comfortable for your lower back. Bend your left / right knee so your foot is straight up in the air. Squeeze your buttocks muscles and lift your left / right thigh off the bed. Do not let your back arch. Tense your thigh   muscle as hard as you can without increasing any knee pain. Hold this position for __________ seconds. Slowly lower your leg to return to the starting position and allow it to relax completely. Repeat __________ times. Complete this exercise __________ times a day. Hip hike Stand sideways on a bottom step. Stand on your left / right leg with your other foot unsupported next to the step. You can hold on to a railing or wall for balance if needed. Keep your knees straight and your  torso square. Then lift your left / right hip up toward the ceiling. Slowly let your left / right hip lower toward the floor, past the starting position. Your foot should get closer to the floor. Do not lean or bend your knees. Repeat __________ times. Complete this exercise __________ times a day. This information is not intended to replace advice given to you by your health care provider. Make sure you discuss any questions you have with your healthcare provider. Document Revised: 11/30/2019 Document Reviewed: 11/30/2019 Elsevier Patient Education  2022 [employer] Inc.  

## 2021-07-11 ENCOUNTER — Ambulatory Visit
Admission: RE | Admit: 2021-07-11 | Discharge: 2021-07-11 | Disposition: A | Discharge status: Home / Self Care | Payer: Medicare Other | Source: Ambulatory Visit | Attending: Internal Medicine | Admitting: Internal Medicine

## 2021-07-11 ENCOUNTER — Other Ambulatory Visit: Payer: Self-pay

## 2021-07-11 VITALS — BP 152/77 | HR 73 | Temp 98.0°F | Resp 18

## 2021-07-11 DIAGNOSIS — H6121 Impacted cerumen, right ear: Secondary | ICD-10-CM

## 2021-07-11 DIAGNOSIS — J309 Allergic rhinitis, unspecified: Secondary | ICD-10-CM

## 2021-07-11 MED ORDER — FLUTICASONE PROPIONATE 50 MCG/ACT NA SUSP: 1.0000 | Freq: Every day | NASAL | 0 refills | Status: AC

## 2021-07-11 MED ORDER — CARBAMIDE PEROXIDE 6.5 % OT SOLN: 5.0000 [drp] | Freq: Two times a day (BID) | OTIC | 0 refills | Status: AC

## 2021-07-11 MED ORDER — LORATADINE 10 MG PO TABS
10.0000 mg | ORAL_TABLET | Freq: Every day | ORAL | 0 refills | Status: DC
Start: 2021-07-11 — End: 2022-07-11

## 2021-07-11 NOTE — ED Triage Notes (Signed)
Left ear draining and right ear hurting x 3 days.  Sinus pressure

## 2021-07-11 NOTE — Discharge Instructions (Addendum)
Please use medications as prescribed. If symptoms worsen please return to urgent care to be reevaluated.

## 2021-07-11 NOTE — ED Provider Notes (Signed)
[facility]    CSN: [id number] Arrival date & time: 07/11/21  1534      History   Chief Complaint No chief complaint on file.   HPI Alicia [name] is a 80 y.o. female comes to the urgent care with a 3-day history of left ear drainage and right ear pain.  Symptoms started insidiously and has been persistent.  Says the left ear drainage was bloody.  No fever or chills.  No dizziness.  No ringing or hearing loss.  Patient complains of some pressure over her forehead.  She denies any nasal congestion.  She endorses nasal itching as well as throat itching.  She has a history of seasonal allergies but has not taking any over-the-counter medication recently.   HPI  Past Medical History:  Diagnosis Date   Anxiety    Bursitis    GERD (gastroesophageal reflux disease)    Hypercholesteremia    Hypertension    Insomnia    Kidney stone    Osteoarthritis    Osteopenia    Restless leg syndrome    RLS (restless legs syndrome) 03/28/2016    Patient Active Problem List   Diagnosis Date Noted   Primary osteoarthritis of left hip 07/02/2021   Family history of heart disease 09/12/2020   Chest pain of uncertain etiology 59/16/3846   Essential hypertension 10/12/2018   Dyslipidemia 10/12/2018   History of kidney stones 10/12/2018   History of gastroesophageal reflux (GERD) 10/12/2018   History of anxiety 10/12/2018   Osteopenia of multiple sites 10/12/2018   Diverticulosis 10/12/2018   Chronic constipation 10/12/2018   Primary osteoarthritis of both hands 10/12/2018   RLS (restless legs syndrome) 03/28/2016    Past Surgical History:  Procedure Laterality Date   ABDOMINAL HYSTERECTOMY     APPENDECTOMY     BLEPHAROPLASTY  2019   CATARACT EXTRACTION Right 02/2010   CHOLECYSTECTOMY     LEFT HEART CATH AND CORONARY ANGIOGRAPHY N/A 09/20/2020   Procedure: LEFT HEART CATH AND CORONARY ANGIOGRAPHY;  Surgeon: Lorretta Harp, MD;  Location: [facility];  Service:  Cardiovascular;  Laterality: N/A;   ROTATOR CUFF REPAIR Right     OB History   No obstetric history on file.      Home Medications    Prior to Admission medications   Medication Sig Start Date End Date Taking? Authorizing Provider  carbamide peroxide (DEBROX) 6.5 % OTIC solution Place 5 drops into both ears 2 (two) times daily for 3 days. 07/11/21 07/14/21 Yes Shankar Silber, Myrene Galas, MD  fluticasone (FLONASE) 50 MCG/ACT nasal spray Place 1 spray into both nostrils daily. 07/11/21  Yes Kearstyn Avitia, Myrene Galas, MD  loratadine (CLARITIN) 10 MG tablet Take 1 tablet (10 mg total) by mouth daily. 07/11/21  Yes Kate Larock, Myrene Galas, MD  acetaminophen (TYLENOL) 650 MG CR tablet Take 1,300 mg by mouth every 8 (eight) hours as needed for pain.    [provider]  amLODipine (NORVASC) 5 MG tablet Take 2.5 mg by mouth daily.    [provider]  Bioflavonoid Products (BIOFLEX PO) Take 1 tablet by mouth daily.    [provider]  celecoxib (CELEBREX) 200 MG capsule Take 200 mg by mouth daily.    [provider]  Flax Oil-Fish Oil-Borage Oil (FISH-FLAX-BORAGE PO) Take 1 capsule by mouth daily.    [provider]  irbesartan (AVAPRO) 300 MG tablet Take 300 mg by mouth daily. 10/11/19   [provider]  isosorbide mononitrate (IMDUR) 30  MG 24 hr tablet Take 0.5 tablets (15 mg total) by mouth daily. 09/12/20 07/02/21  Lorretta Harp, MD  LORazepam (ATIVAN) 0.5 MG tablet Take 0.5 mg by mouth 2 (two) times daily as needed for anxiety. 02/11/16   [provider]  meclizine (ANTIVERT) 25 MG tablet Take 25 mg by mouth 3 (three) times daily as needed for dizziness. 04/27/20   [provider]  metoprolol succinate (TOPROL-XL) 50 MG 24 hr tablet Take 50 mg by mouth daily. 06/10/17   [provider]  Multiple Minerals-Vitamins (CAL MAG ZINC +D3 PO) Take 1 tablet by mouth daily.    [provider]  multivitamin-lutein (OCUVITE-LUTEIN) CAPS Take 830  capsules by mouth daily.    [provider]  omeprazole (PRILOSEC) 40 MG capsule Take 40 mg by mouth daily. 02/06/16   [provider]  rOPINIRole (REQUIP) 1 MG tablet Take 1 tablet (1 mg total) by mouth 3 (three) times daily. Patient taking differently: Take 1-2 mg by mouth See admin instructions. Take 1 tablet (1 mg) by mouth in the morning & take 2 tablets (2 mg) by mouth at night. 10/20/18   Patel, Arvin Collard K, DO  simvastatin (ZOCOR) 20 MG tablet Take 20 mg by mouth every evening. 02/06/16   [provider]  spironolactone (ALDACTONE) 25 MG tablet Take 25 mg by mouth daily. 11/24/19   [provider]  traZODone (DESYREL) 50 MG tablet Take 50 mg by mouth at bedtime.  06/10/17   [provider]    Family History Family History  Problem Relation Age of Onset   Heart attack Mother    Depression Mother    Stroke Mother    Diabetes Mother    Heart attack Father    COPD Sister    COPD Brother    COPD Brother    COPD Sister    Restless legs syndrome Son    Restless legs syndrome Son    Restless legs syndrome Daughter     Social History Social History   Tobacco Use   Smoking status: Former    Packs/day: 0.50    Years: 20.00    Pack years: 10.00    Types: Cigarettes    Quit date: 10/06/1996    Years since quitting: 24.7   Smokeless tobacco: Never  Vaping Use   Vaping Use: Never used  Substance Use Topics   Alcohol use: No    Alcohol/week: 0.0 standard drinks   Drug use: No     Allergies   Ibuprofen, Lexapro [escitalopram oxalate], Lisinopril, Mobic [meloxicam], Nabumetone, Prilosec otc [omeprazole magnesium], Zoloft [sertraline hcl], and Sulfa antibiotics   Review of Systems Review of Systems  Constitutional: Negative.   HENT:  Positive for ear pain and sinus pressure. Negative for ear discharge, facial swelling, sinus pain and sore throat.   Respiratory: Negative.    Cardiovascular: Negative.   Gastrointestinal: Negative.      Physical Exam Triage Vital Signs ED Triage Vitals  Enc Vitals Group     BP 07/11/21 1540 (!) 152/77     Pulse Rate 07/11/21 1540 73     Resp 07/11/21 1540 18     Temp 07/11/21 1540 98 F (36.7 C)     Temp Source 07/11/21 1540 Oral     SpO2 07/11/21 1540 96 %     Weight --      Height --      Head Circumference --      Peak Flow --  Pain Score 07/11/21 1541 5     Pain Loc --      Pain Edu? --      Excl. in [place]? --    No data found.  Updated Vital Signs BP (!) 152/77 (BP Location: Right Arm)   Pulse 73   Temp 98 F (36.7 C) (Oral)   Resp 18   SpO2 96%   Visual Acuity Right Eye Distance:   Left Eye Distance:   Bilateral Distance:    Right Eye Near:   Left Eye Near:    Bilateral Near:     Physical Exam Vitals and nursing note reviewed.  Constitutional:      General: She is not in acute distress.    Appearance: She is not ill-appearing.  HENT:     Right Ear: Tympanic membrane normal.     Left Ear: Tympanic membrane normal.     Nose: No congestion or rhinorrhea.  Eyes:     Extraocular Movements: Extraocular movements intact.     Pupils: Pupils are equal, round, and reactive to light.  Cardiovascular:     Rate and Rhythm: Normal rate and regular rhythm.  Pulmonary:     Effort: Pulmonary effort is normal.     Breath sounds: Normal breath sounds.  Neurological:     Mental Status: She is alert.     UC Treatments / Results  Labs (all labs ordered are listed, but only abnormal results are displayed) Labs Reviewed - No data to display  EKG   Radiology No results found.  Procedures Procedures (including critical care time)  Medications Ordered in UC Medications - No data to display  Initial Impression / Assessment and Plan / UC Course  I have reviewed the triage vital signs and the nursing notes.  Pertinent labs & imaging results that were available during my care of the patient were reviewed by me and considered in my medical decision  making (see chart for details).     1.  Impacted cerumen in the right ear: Debrox eardrops Return to urgent care if symptoms persist  2.  Allergic sinusitis: Fluticasone nasal spray Claritin Return to urgent care if symptoms worsen. Final Clinical Impressions(s) / UC Diagnoses   Final diagnoses:  Impacted cerumen of right ear  Allergic sinusitis     Discharge Instructions      Please use medications as prescribed. If symptoms worsen please return to urgent care to be reevaluated.   ED Prescriptions     Medication Sig Dispense Auth. Provider   fluticasone (FLONASE) 50 MCG/ACT nasal spray Place 1 spray into both nostrils daily. 16 g Nelvin Tomb, Myrene Galas, MD   carbamide peroxide (DEBROX) 6.5 % OTIC solution Place 5 drops into both ears 2 (two) times daily for 3 days. 15 mL Perrin Eddleman, Myrene Galas, MD   loratadine (CLARITIN) 10 MG tablet Take 1 tablet (10 mg total) by mouth daily. 30 tablet Nabilah Davoli, Myrene Galas, MD      PDMP not reviewed this encounter.   Chase Picket, MD 07/11/21 [phone]

## 2021-08-13 DIAGNOSIS — U071 COVID-19: Secondary | ICD-10-CM | POA: Diagnosis not present

## 2021-10-14 DIAGNOSIS — R69 Illness, unspecified: Secondary | ICD-10-CM | POA: Diagnosis not present

## 2021-10-14 DIAGNOSIS — G479 Sleep disorder, unspecified: Secondary | ICD-10-CM | POA: Diagnosis not present

## 2021-10-14 DIAGNOSIS — D692 Other nonthrombocytopenic purpura: Secondary | ICD-10-CM | POA: Diagnosis not present

## 2021-10-14 DIAGNOSIS — G2581 Restless legs syndrome: Secondary | ICD-10-CM | POA: Diagnosis not present

## 2021-10-14 DIAGNOSIS — I10 Essential (primary) hypertension: Secondary | ICD-10-CM | POA: Diagnosis not present

## 2021-10-14 DIAGNOSIS — K219 Gastro-esophageal reflux disease without esophagitis: Secondary | ICD-10-CM | POA: Diagnosis not present

## 2021-10-14 DIAGNOSIS — I700 Atherosclerosis of aorta: Secondary | ICD-10-CM | POA: Diagnosis not present

## 2021-10-14 DIAGNOSIS — E7800 Pure hypercholesterolemia, unspecified: Secondary | ICD-10-CM | POA: Diagnosis not present

## 2021-10-14 DIAGNOSIS — Z0000 Encounter for general adult medical examination without abnormal findings: Secondary | ICD-10-CM | POA: Diagnosis not present

## 2021-10-14 DIAGNOSIS — N183 Chronic kidney disease, stage 3 unspecified: Secondary | ICD-10-CM | POA: Diagnosis not present

## 2021-10-31 DIAGNOSIS — R319 Hematuria, unspecified: Secondary | ICD-10-CM | POA: Diagnosis not present

## 2021-10-31 DIAGNOSIS — M545 Low back pain, unspecified: Secondary | ICD-10-CM | POA: Diagnosis not present

## 2021-11-05 ENCOUNTER — Other Ambulatory Visit: Payer: Self-pay | Admitting: Cardiovascular Disease

## 2021-11-05 DIAGNOSIS — R079 Chest pain, unspecified: Secondary | ICD-10-CM

## 2021-11-05 DIAGNOSIS — E785 Hyperlipidemia, unspecified: Secondary | ICD-10-CM

## 2021-11-19 ENCOUNTER — Ambulatory Visit: Payer: Medicare Other | Admitting: Cardiovascular Disease

## 2021-12-03 ENCOUNTER — Ambulatory Visit (INDEPENDENT_AMBULATORY_CARE_PROVIDER_SITE_OTHER): Payer: Medicare HMO | Admitting: Cardiovascular Disease

## 2021-12-03 ENCOUNTER — Encounter: Payer: Self-pay | Admitting: Cardiovascular Disease

## 2021-12-03 ENCOUNTER — Other Ambulatory Visit: Payer: Self-pay

## 2021-12-03 VITALS — BP 124/66 | HR 77 | Ht 61.0 in | Wt 153.8 lb

## 2021-12-03 DIAGNOSIS — E785 Hyperlipidemia, unspecified: Secondary | ICD-10-CM

## 2021-12-03 DIAGNOSIS — I10 Essential (primary) hypertension: Secondary | ICD-10-CM | POA: Diagnosis not present

## 2021-12-03 DIAGNOSIS — R079 Chest pain, unspecified: Secondary | ICD-10-CM | POA: Diagnosis not present

## 2021-12-03 NOTE — Patient Instructions (Signed)

## 2021-12-03 NOTE — Assessment & Plan Note (Signed)
History of essential hypertension a blood pressure measured today at 124/66.  She is on amlodipine, metoprolol and Avapro as well as spironolactone.

## 2021-12-03 NOTE — Assessment & Plan Note (Signed)
History of chest pain with a normal cath which I performed 09/20/2020.  Chest pain was attributable to reflux which is improved on antireflux medications.

## 2021-12-03 NOTE — Assessment & Plan Note (Signed)
History of dyslipidemia on statin therapy with lipid profile performed 10/14/2021 revealing total cholesterol 159, LDL of 83 and HDL 55.  Given her normal coronary arteries, this is acceptable for primary prevention.

## 2021-12-03 NOTE — Progress Notes (Signed)
12/03/2021 Alicia [name]   06/06/1941  [id number]  Primary Physician Gaynelle Arabian, MD Primary Cardiologist: Lorretta Harp MD Lupe Carney, [place]  HPI:  Alicia [name] is a 81 y.o.  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  She was referred by Dr. Alroy Dust, her primary provider, for evaluation of exertional chest pain.  I last saw her in the office 11/13/2020. Her risk factor profile is notable for 15 to 20 pack years of tobacco abuse having quit 25 years ago, treated hypertension and hyperlipidemia.  Both of her parents died of myocardial infarction's.  She is never had a heart attack or stroke.  She does have RLS and GERD.  She is complained of new onset exertional chest pain for last 3 to 4 months that occurs with walking associated shortness of breath and upper extremity radiation.   I performed outpatient radial diagnostic coronary angiography 09/20/2020 which was entirely normal.  She continues to have symptoms of chest pain which may be related to her reflux.  Since I saw her a year ago she continues to do well.  Her chest pain is improved with antireflux measures.  Her blood pressure is well controlled as is her hyperlipidemia.   Current Meds  Medication Sig   acetaminophen (TYLENOL) 650 MG CR tablet Take 1,300 mg by mouth every 8 (eight) hours as needed for pain.   amLODipine (NORVASC) 5 MG tablet Take 2.5 mg by mouth daily.   Bioflavonoid Products (BIOFLEX PO) Take 1 tablet by mouth daily.   celecoxib (CELEBREX) 200 MG capsule Take 200 mg by mouth daily.   Flax Oil-Fish Oil-Borage Oil (FISH-FLAX-BORAGE PO) Take 1 capsule by mouth daily.   fluticasone (FLONASE) 50 MCG/ACT nasal spray Place 1 spray into both nostrils daily. (Patient taking differently: Place 1 spray into both nostrils as needed.)   irbesartan (AVAPRO) 300 MG tablet Take 300 mg by  mouth daily.   isosorbide mononitrate (IMDUR) 30 MG 24 hr tablet Take 1/2 (one-half) tablet by mouth once daily   loratadine (CLARITIN) 10 MG tablet Take 1 tablet (10 mg total) by mouth daily.   LORazepam (ATIVAN) 0.5 MG tablet Take 0.5 mg by mouth 2 (two) times daily as needed for anxiety.   meclizine (ANTIVERT) 25 MG tablet Take 25 mg by mouth 3 (three) times daily as needed for dizziness.   metoprolol succinate (TOPROL-XL) 50 MG 24 hr tablet Take 50 mg by mouth daily.   Multiple Minerals-Vitamins (CAL MAG ZINC +D3 PO) Take 1 tablet by mouth daily.   multivitamin-lutein (OCUVITE-LUTEIN) CAPS Take 830 capsules by mouth daily.   omeprazole (PRILOSEC) 40 MG capsule Take 40 mg by mouth daily.   rOPINIRole (REQUIP) 1 MG tablet Take 1 tablet (1 mg total) by mouth 3 (three) times daily. (Patient taking differently: Take 1-2 mg by mouth See admin instructions. Take 1 tablet (1 mg) by mouth in the morning & take 2 tablets (2 mg) by mouth at night.)   simvastatin (ZOCOR) 20 MG tablet Take 20 mg by mouth every evening.   spironolactone (ALDACTONE) 25 MG tablet Take 25 mg by mouth daily.   traZODone (DESYREL) 50 MG tablet Take 50 mg by mouth at bedtime.    Current Facility-Administered Medications for the 12/03/21 encounter (Office Visit) with Lorretta Harp, MD  Medication   sodium chloride flush (NS) 0.9 % injection  3 mL     Allergies  Allergen Reactions   Ibuprofen Other (See Comments)    GI UPSET   Lexapro [Escitalopram Oxalate] Other (See Comments)    GERD   Lisinopril Cough   Mobic [Meloxicam] Other (See Comments)    LEG PAIN   Nabumetone Other (See Comments)    GI UPSET   Prilosec Otc [Omeprazole Magnesium] Other (See Comments)    ARM SPASM   Zoloft [Sertraline Hcl] Other (See Comments)    INSOMNIA   Sulfa Antibiotics Rash    Social History   Socioeconomic History   Marital status: Widowed    Spouse name: Not on file   Number of children: Not on file   Years of education:  Not on file   Highest education level: Not on file  Occupational History   Not on file  Tobacco Use   Smoking status: Former    Packs/day: 0.50    Years: 20.00    Pack years: 10.00    Types: Cigarettes    Quit date: 10/06/1996    Years since quitting: 25.1   Smokeless tobacco: Never  Vaping Use   Vaping Use: Never used  Substance and Sexual Activity   Alcohol use: No    Alcohol/week: 0.0 standard drinks   Drug use: No   Sexual activity: Not on file  Other Topics Concern   Not on file  Social History Narrative   Lives alone in a one story home.  Has 3 children.     Semi retired.     Worked in [place] for [employer].  Education: high school.   Social Determinants of Health   Financial Resource Strain: Not on file  Food Insecurity: Not on file  Transportation Needs: Not on file  Physical Activity: Not on file  Stress: Not on file  Social Connections: Not on file  Intimate Partner Violence: Not on file     Review of Systems: General: negative for chills, fever, night sweats or weight changes.  Cardiovascular: negative for chest pain, dyspnea on exertion, edema, orthopnea, palpitations, paroxysmal nocturnal dyspnea or shortness of breath Dermatological: negative for rash Respiratory: negative for cough or wheezing Urologic: negative for hematuria Abdominal: negative for nausea, vomiting, diarrhea, bright red blood per rectum, melena, or hematemesis Neurologic: negative for visual changes, syncope, or dizziness All other systems reviewed and are otherwise negative except as noted above.    Blood pressure 124/66, pulse 77, height 5\' 1"  (1.549 m), weight 153 lb 12.8 oz (69.8 kg), SpO2 98 %.  General appearance: alert and no distress Neck: no adenopathy, no carotid bruit, no JVD, supple, symmetrical, trachea midline, and thyroid not enlarged, symmetric, no tenderness/mass/nodules Lungs: clear to auscultation bilaterally Heart: regular rate and rhythm, S1, S2 normal, no murmur,  click, rub or gallop Extremities: extremities normal, atraumatic, no cyanosis or edema Pulses: 2+ and symmetric Skin: Skin color, texture, turgor normal. No rashes or lesions Neurologic: Grossly normal  EKG sinus rhythm at 77 with right bundle branch block which is chronic.  I personally reviewed this EKG.  ASSESSMENT AND PLAN:   Essential hypertension History of essential hypertension a blood pressure measured today at 124/66.  She is on amlodipine, metoprolol and Avapro as well as spironolactone.  Dyslipidemia History of dyslipidemia on statin therapy with lipid profile performed 10/14/2021 revealing total cholesterol 159, LDL of 83 and HDL 55.  Given her normal coronary arteries, this is acceptable for primary prevention.  Chest pain of uncertain etiology History of chest  pain with a normal cath which I performed 09/20/2020.  Chest pain was attributable to reflux which is improved on antireflux medications.     Lorretta Harp MD FACP,FACC,FAHA, [facility] 12/03/2021 11:54 AM

## 2021-12-17 NOTE — Progress Notes (Deleted)
? ?Office Visit Note ? ?Patient: Alicia [name]             ?Date of Birth: 29-Jun-1941           ?MRN: [id number]             ?PCP: Gaynelle Arabian, MD ?Referring: Gaynelle Arabian, MD ?Visit Date: 12/31/2021 ?Occupation: '@GUAROCC'$ @ ? ?Subjective:  ?No chief complaint on file. ? ? ?History of Present Illness: Alicia [name] is a 81 y.o. female ***  ? ?Activities of Daily Living:  ?Patient reports morning stiffness for *** {minute/hour:19697}.   ?Patient {ACTIONS;DENIES/REPORTS:21021675::"Denies"} nocturnal pain.  ?Difficulty dressing/grooming: {ACTIONS;DENIES/REPORTS:21021675::"Denies"} ?Difficulty climbing stairs: {ACTIONS;DENIES/REPORTS:21021675::"Denies"} ?Difficulty getting out of chair: {ACTIONS;DENIES/REPORTS:21021675::"Denies"} ?Difficulty using hands for taps, buttons, cutlery, and/or writing: {ACTIONS;DENIES/REPORTS:21021675::"Denies"} ? ?No Rheumatology ROS completed.  ? ?PMFS History:  ?Patient Active Problem List  ? Diagnosis Date Noted  ? Primary osteoarthritis of left hip 07/02/2021  ? Family history of heart disease 09/12/2020  ? Chest pain of uncertain etiology 40/34/7425  ? Essential hypertension 10/12/2018  ? Dyslipidemia 10/12/2018  ? History of kidney stones 10/12/2018  ? History of gastroesophageal reflux (GERD) 10/12/2018  ? History of anxiety 10/12/2018  ? Osteopenia of multiple sites 10/12/2018  ? Diverticulosis 10/12/2018  ? Chronic constipation 10/12/2018  ? Primary osteoarthritis of both hands 10/12/2018  ? RLS (restless legs syndrome) 03/28/2016  ?  ?Past Medical History:  ?Diagnosis Date  ? Anxiety   ? Bursitis   ? GERD (gastroesophageal reflux disease)   ? Hypercholesteremia   ? Hypertension   ? Insomnia   ? Kidney stone   ? Osteoarthritis   ? Osteopenia   ? Restless leg syndrome   ? RLS (restless legs syndrome) 03/28/2016  ?  ?Family History  ?Problem Relation Age of Onset  ? Heart attack Mother   ? Depression Mother   ? Stroke Mother   ? Diabetes Mother   ? Heart attack Father   ? COPD  Sister   ? COPD Brother   ? COPD Brother   ? COPD Sister   ? Restless legs syndrome Son   ? Restless legs syndrome Son   ? Restless legs syndrome Daughter   ? ?Past Surgical History:  ?Procedure Laterality Date  ? ABDOMINAL HYSTERECTOMY    ? APPENDECTOMY    ? BLEPHAROPLASTY  2019  ? CATARACT EXTRACTION Right 02/2010  ? CHOLECYSTECTOMY    ? LEFT HEART CATH AND CORONARY ANGIOGRAPHY N/A 09/20/2020  ? Procedure: LEFT HEART CATH AND CORONARY ANGIOGRAPHY;  Surgeon: Lorretta Harp, MD;  Location: [facility];  Service: Cardiovascular;  Laterality: N/A;  ? ROTATOR CUFF REPAIR Right   ? ?Social History  ? ?Social History Narrative  ? Lives alone in a one story home.  Has 3 children.    ? Semi retired.    ? Worked in [place] for [employer].  Education: high school.  ? ?Immunization History  ?Administered Date(s) Administered  ? Moderna Sars-Covid-2 Vaccination 10/19/2019, 11/15/2019, 08/13/2020  ?  ? ?Objective: ?Vital Signs: There were no vitals taken for this visit.  ? ?Physical Exam  ? ?Musculoskeletal Exam: *** ? ?CDAI Exam: ?CDAI Score: -- ?Patient Global: --; Provider Global: -- ?Swollen: --; Tender: -- ?Joint Exam 12/31/2021  ? ?No joint exam has been documented for this visit  ? ?There is currently no information documented on the homunculus. Go to the Rheumatology activity and complete the homunculus joint exam. ? ?Investigation: ?No additional findings. ? ?Imaging: ?No results found. ? ?  Recent Labs: ?Lab Results  ?Component Value Date  ? WBC 7.7 09/20/2020  ? HGB 13.8 09/20/2020  ? PLT 336 09/20/2020  ? NA 136 09/20/2020  ? K 4.2 09/20/2020  ? CL 101 09/20/2020  ? CO2 23 09/20/2020  ? GLUCOSE 104 (H) 09/20/2020  ? BUN 35 (H) 09/20/2020  ? CREATININE 1.01 (H) 09/20/2020  ? BILITOT 0.6 10/08/2018  ? ALKPHOS 76 10/08/2018  ? AST 20 10/08/2018  ? ALT 15 10/08/2018  ? PROT 7.3 10/08/2018  ? ALBUMIN 3.8 10/08/2018  ? CALCIUM 10.1 09/20/2020  ? GFRAA >60 10/08/2018  ? ? ?Speciality Comments: No specialty comments  available. ? ?Procedures:  ?No procedures performed ?Allergies: Ibuprofen, Lexapro [escitalopram oxalate], Lisinopril, Mobic [meloxicam], Nabumetone, Prilosec otc [omeprazole magnesium], Zoloft [sertraline hcl], and Sulfa antibiotics  ? ?Assessment / Plan:     ?Visit Diagnoses: No diagnosis found. ? ?Orders: ?No orders of the defined types were placed in this encounter. ? ?No orders of the defined types were placed in this encounter. ? ? ?Face-to-face time spent with patient was *** minutes. Greater than 50% of time was spent in counseling and coordination of care. ? ?Follow-Up Instructions: No follow-ups on file. ? ? ?Earnestine Mealing, CMA ? ?Note - This record has been created using [employer].  ?Chart creation errors have been sought, but may not always  ?have been located. Such creation errors do not reflect on  ?the standard of medical care.  ?

## 2021-12-31 ENCOUNTER — Ambulatory Visit: Payer: Medicare Other | Admitting: Physician Assistant

## 2021-12-31 DIAGNOSIS — Z8719 Personal history of other diseases of the digestive system: Secondary | ICD-10-CM

## 2021-12-31 DIAGNOSIS — M1612 Unilateral primary osteoarthritis, left hip: Secondary | ICD-10-CM

## 2021-12-31 DIAGNOSIS — M19041 Primary osteoarthritis, right hand: Secondary | ICD-10-CM

## 2021-12-31 DIAGNOSIS — K5909 Other constipation: Secondary | ICD-10-CM

## 2021-12-31 DIAGNOSIS — G2581 Restless legs syndrome: Secondary | ICD-10-CM

## 2021-12-31 DIAGNOSIS — K5790 Diverticulosis of intestine, part unspecified, without perforation or abscess without bleeding: Secondary | ICD-10-CM

## 2021-12-31 DIAGNOSIS — I10 Essential (primary) hypertension: Secondary | ICD-10-CM

## 2021-12-31 DIAGNOSIS — M8589 Other specified disorders of bone density and structure, multiple sites: Secondary | ICD-10-CM

## 2021-12-31 DIAGNOSIS — M7061 Trochanteric bursitis, right hip: Secondary | ICD-10-CM

## 2021-12-31 DIAGNOSIS — E785 Hyperlipidemia, unspecified: Secondary | ICD-10-CM

## 2021-12-31 DIAGNOSIS — Z8659 Personal history of other mental and behavioral disorders: Secondary | ICD-10-CM

## 2021-12-31 DIAGNOSIS — Z87442 Personal history of urinary calculi: Secondary | ICD-10-CM

## 2022-03-12 ENCOUNTER — Other Ambulatory Visit: Payer: Self-pay | Admitting: Cardiovascular Disease

## 2022-03-12 DIAGNOSIS — E785 Hyperlipidemia, unspecified: Secondary | ICD-10-CM

## 2022-03-12 DIAGNOSIS — R079 Chest pain, unspecified: Secondary | ICD-10-CM

## 2022-04-18 DIAGNOSIS — Z03818 Encounter for observation for suspected exposure to other biological agents ruled out: Secondary | ICD-10-CM | POA: Diagnosis not present

## 2022-04-18 DIAGNOSIS — G2581 Restless legs syndrome: Secondary | ICD-10-CM | POA: Diagnosis not present

## 2022-04-18 DIAGNOSIS — B349 Viral infection, unspecified: Secondary | ICD-10-CM | POA: Diagnosis not present

## 2022-04-18 DIAGNOSIS — G479 Sleep disorder, unspecified: Secondary | ICD-10-CM | POA: Diagnosis not present

## 2022-04-18 DIAGNOSIS — N183 Chronic kidney disease, stage 3 unspecified: Secondary | ICD-10-CM | POA: Diagnosis not present

## 2022-04-18 DIAGNOSIS — I10 Essential (primary) hypertension: Secondary | ICD-10-CM | POA: Diagnosis not present

## 2022-04-18 DIAGNOSIS — R69 Illness, unspecified: Secondary | ICD-10-CM | POA: Diagnosis not present

## 2022-04-18 DIAGNOSIS — M1990 Unspecified osteoarthritis, unspecified site: Secondary | ICD-10-CM | POA: Diagnosis not present

## 2022-04-18 DIAGNOSIS — I700 Atherosclerosis of aorta: Secondary | ICD-10-CM | POA: Diagnosis not present

## 2022-04-18 DIAGNOSIS — K219 Gastro-esophageal reflux disease without esophagitis: Secondary | ICD-10-CM | POA: Diagnosis not present

## 2022-04-18 DIAGNOSIS — E7800 Pure hypercholesterolemia, unspecified: Secondary | ICD-10-CM | POA: Diagnosis not present

## 2022-04-18 DIAGNOSIS — D692 Other nonthrombocytopenic purpura: Secondary | ICD-10-CM | POA: Diagnosis not present

## 2022-04-18 DIAGNOSIS — M899 Disorder of bone, unspecified: Secondary | ICD-10-CM | POA: Diagnosis not present

## 2022-05-06 DIAGNOSIS — Z0100 Encounter for examination of eyes and vision without abnormal findings: Secondary | ICD-10-CM | POA: Diagnosis not present

## 2022-05-06 DIAGNOSIS — H524 Presbyopia: Secondary | ICD-10-CM | POA: Diagnosis not present

## 2022-05-21 DIAGNOSIS — M1990 Unspecified osteoarthritis, unspecified site: Secondary | ICD-10-CM | POA: Diagnosis not present

## 2022-05-21 DIAGNOSIS — G2581 Restless legs syndrome: Secondary | ICD-10-CM | POA: Diagnosis not present

## 2022-05-21 DIAGNOSIS — J309 Allergic rhinitis, unspecified: Secondary | ICD-10-CM | POA: Diagnosis not present

## 2022-05-21 DIAGNOSIS — G4700 Insomnia, unspecified: Secondary | ICD-10-CM | POA: Diagnosis not present

## 2022-05-21 DIAGNOSIS — I209 Angina pectoris, unspecified: Secondary | ICD-10-CM | POA: Diagnosis not present

## 2022-05-21 DIAGNOSIS — M545 Low back pain, unspecified: Secondary | ICD-10-CM | POA: Diagnosis not present

## 2022-05-21 DIAGNOSIS — Z87891 Personal history of nicotine dependence: Secondary | ICD-10-CM | POA: Diagnosis not present

## 2022-05-21 DIAGNOSIS — Z882 Allergy status to sulfonamides status: Secondary | ICD-10-CM | POA: Diagnosis not present

## 2022-05-21 DIAGNOSIS — E785 Hyperlipidemia, unspecified: Secondary | ICD-10-CM | POA: Diagnosis not present

## 2022-05-21 DIAGNOSIS — I10 Essential (primary) hypertension: Secondary | ICD-10-CM | POA: Diagnosis not present

## 2022-05-21 DIAGNOSIS — K219 Gastro-esophageal reflux disease without esophagitis: Secondary | ICD-10-CM | POA: Diagnosis not present

## 2022-05-21 DIAGNOSIS — R69 Illness, unspecified: Secondary | ICD-10-CM | POA: Diagnosis not present

## 2022-05-29 DIAGNOSIS — Z1231 Encounter for screening mammogram for malignant neoplasm of breast: Secondary | ICD-10-CM | POA: Diagnosis not present

## 2022-06-06 DIAGNOSIS — C801 Malignant (primary) neoplasm, unspecified: Secondary | ICD-10-CM

## 2022-06-06 HISTORY — DX: Malignant (primary) neoplasm, unspecified: C80.1

## 2022-06-10 DIAGNOSIS — N6002 Solitary cyst of left breast: Secondary | ICD-10-CM | POA: Diagnosis not present

## 2022-06-10 DIAGNOSIS — R922 Inconclusive mammogram: Secondary | ICD-10-CM | POA: Diagnosis not present

## 2022-06-10 DIAGNOSIS — R928 Other abnormal and inconclusive findings on diagnostic imaging of breast: Secondary | ICD-10-CM | POA: Diagnosis not present

## 2022-06-19 ENCOUNTER — Other Ambulatory Visit: Payer: Self-pay

## 2022-06-19 DIAGNOSIS — D0512 Intraductal carcinoma in situ of left breast: Secondary | ICD-10-CM | POA: Diagnosis not present

## 2022-06-23 ENCOUNTER — Encounter: Payer: Self-pay | Admitting: Family Medicine

## 2022-06-24 ENCOUNTER — Telehealth: Payer: Self-pay | Admitting: Hematology and Oncology

## 2022-06-24 NOTE — Telephone Encounter (Signed)
Spoke to patient to confirm afternoon clinic appointment for 9/27, [facility] will send paperwork

## 2022-06-30 ENCOUNTER — Encounter: Payer: Self-pay | Admitting: *Deleted

## 2022-06-30 DIAGNOSIS — D0512 Intraductal carcinoma in situ of left breast: Secondary | ICD-10-CM | POA: Insufficient documentation

## 2022-07-01 NOTE — Progress Notes (Signed)
Radiation Oncology         [phone] ________________________________  Multidisciplinary Breast Oncology Clinic [facility]) Initial Outpatient Consultation  Name: Alicia [name] MRN: [id number]  Date: 07/02/2022  DOB: 16-Apr-1941  CC:Blair Heys, MD  Abigail Miyamoto, MD   REFERRING PHYSICIAN: Abigail Miyamoto, MD  DIAGNOSIS: There were no encounter diagnoses.  Stage 0 (cTis (DCIS), cN0, cM0) Left Breast, Intermediate-grade DCIS, ER+ / PR+ / Her2 not assessed  No diagnosis found.  HISTORY OF PRESENT ILLNESS::Alicia [name] is a 81 y.o. female who is presenting to the office today for evaluation of her newly diagnosed breast cancer. She is accompanied by ***. She is doing well overall.   She had routine screening mammography on 05/29/22 showing a possible abnormality in the left breast. She underwent unilateral left breast diagnostic mammography with tomography and left breast ultrasonography at [facility] on 06/10/22 showing: a 1.2 cm oval cyst in the 12 o'clock left breast, 1 cmfn. The cyst was noted with suspicious features mammographically but without definite sonographic correlate. No abnormal left axillary lymph nodes were appreciated.   Biopsy if the upper central left breast on 06/19/22 showed: intermediate grade DCIS measuring 0.6 cm in the greatest linear extent with calcifications, and partially involving a complex sclerosing lesion. Prognostic indicators significant for: estrogen receptor, 100% positive and progesterone receptor, 60% positive, both with strong staining intensity. HER2 not assessed.   Menarche: *** years old Age at first live birth: *** years old GP: *** LMP: *** Contraceptive: *** HRT: ***   The patient was referred today for presentation in the multidisciplinary conference.  Radiology studies and pathology slides were presented there for review and discussion of treatment options.  A consensus was discussed regarding potential next steps.  PREVIOUS  RADIATION THERAPY: No  PAST MEDICAL HISTORY:  Past Medical History:  Diagnosis Date   Anxiety    Bursitis    GERD (gastroesophageal reflux disease)    Hypercholesteremia    Hypertension    Insomnia    Kidney stone    Osteoarthritis    Osteopenia    Restless leg syndrome    RLS (restless legs syndrome) 03/28/2016    PAST SURGICAL HISTORY: Past Surgical History:  Procedure Laterality Date   ABDOMINAL HYSTERECTOMY     APPENDECTOMY     BLEPHAROPLASTY  2019   CATARACT EXTRACTION Right 02/2010   CHOLECYSTECTOMY     LEFT HEART CATH AND CORONARY ANGIOGRAPHY N/A 09/20/2020   Procedure: LEFT HEART CATH AND CORONARY ANGIOGRAPHY;  Surgeon: Runell Gess, MD;  Location: [facility];  Service: Cardiovascular;  Laterality: N/A;   ROTATOR CUFF REPAIR Right     FAMILY HISTORY:  Family History  Problem Relation Age of Onset   Heart attack Mother    Depression Mother    Stroke Mother    Diabetes Mother    Heart attack Father    COPD Sister    COPD Brother    COPD Brother    COPD Sister    Restless legs syndrome Son    Restless legs syndrome Son    Restless legs syndrome Daughter     SOCIAL HISTORY:  Social History   Socioeconomic History   Marital status: Widowed    Spouse name: Not on file   Number of children: Not on file   Years of education: Not on file   Highest education level: Not on file  Occupational History   Not on file  Tobacco Use   Smoking status: Former  Packs/day: 0.50    Years: 20.00    Total pack years: 10.00    Types: Cigarettes    Quit date: 10/06/1996    Years since quitting: 25.7   Smokeless tobacco: Never  Vaping Use   Vaping Use: Never used  Substance and Sexual Activity   Alcohol use: No    Alcohol/week: 0.0 standard drinks of alcohol   Drug use: No   Sexual activity: Not on file  Other Topics Concern   Not on file  Social History Narrative   Lives alone in a one story home.  Has 3 children.     Semi retired.     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  Education: high school.   Social Determinants of Health   Financial Resource Strain: Not on file  Food Insecurity: Not on file  Transportation Needs: Not on file  Physical Activity: Not on file  Stress: Not on file  Social Connections: Not on file    ALLERGIES:  Allergies  Allergen Reactions   Ibuprofen Other (See Comments)    GI UPSET   Lexapro [Escitalopram Oxalate] Other (See Comments)    GERD   Lisinopril Cough   Mobic [Meloxicam] Other (See Comments)    LEG PAIN   Nabumetone Other (See Comments)    GI UPSET   Prilosec Otc [Omeprazole Magnesium] Other (See Comments)    ARM SPASM   Zoloft [Sertraline Hcl] Other (See Comments)    INSOMNIA   Sulfa Antibiotics Rash    MEDICATIONS:  Current Outpatient Medications  Medication Sig Dispense Refill   acetaminophen (TYLENOL) 650 MG CR tablet Take 1,300 mg by mouth every 8 (eight) hours as needed for pain.     amLODipine (NORVASC) 5 MG tablet Take 2.5 mg by mouth daily.     Bioflavonoid Products (BIOFLEX PO) Take 1 tablet by mouth daily.     celecoxib (CELEBREX) 200 MG capsule Take 200 mg by mouth daily.     Flax Oil-Fish Oil-Borage Oil (FISH-FLAX-BORAGE PO) Take 1 capsule by mouth daily.     fluticasone (FLONASE) 50 MCG/ACT nasal spray Place 1 spray into both nostrils daily. (Patient taking differently: Place 1 spray into both nostrils as needed.) 16 g 0   irbesartan (AVAPRO) 300 MG tablet Take 300 mg by mouth daily.     isosorbide mononitrate (IMDUR) 30 MG 24 hr tablet Take 1/2 (one-half) tablet by mouth once daily 45 tablet 2   loratadine (CLARITIN) 10 MG tablet Take 1 tablet (10 mg total) by mouth daily. 30 tablet 0   LORazepam (ATIVAN) 0.5 MG tablet Take 0.5 mg by mouth 2 (two) times daily as needed for anxiety.     meclizine (ANTIVERT) 25 MG tablet Take 25 mg by mouth 3 (three) times daily as needed for dizziness.     metoprolol succinate (TOPROL-XL) 50 MG 24 hr tablet Take 50 mg by mouth daily.      Multiple Minerals-Vitamins (CAL MAG ZINC +D3 PO) Take 1 tablet by mouth daily.     multivitamin-lutein (OCUVITE-LUTEIN) CAPS Take 830 capsules by mouth daily.     omeprazole (PRILOSEC) 40 MG capsule Take 40 mg by mouth daily.     rOPINIRole (REQUIP) 1 MG tablet Take 1 tablet (1 mg total) by mouth 3 (three) times daily. (Patient taking differently: Take 1-2 mg by mouth See admin instructions. Take 1 tablet (1 mg) by mouth in the morning & take 2 tablets (2 mg) by mouth at night.) 270 tablet 3  simvastatin (ZOCOR) 20 MG tablet Take 20 mg by mouth every evening.     spironolactone (ALDACTONE) 25 MG tablet Take 25 mg by mouth daily.     traZODone (DESYREL) 50 MG tablet Take 50 mg by mouth at bedtime.      Current Facility-Administered Medications  Medication Dose Route Frequency Provider Last Rate Last Admin   sodium chloride flush (NS) 0.9 % injection 3 mL  3 mL Intravenous Q12H Lorretta Harp, MD        REVIEW OF SYSTEMS: A 10+ POINT REVIEW OF SYSTEMS WAS OBTAINED including neurology, dermatology, psychiatry, cardiac, respiratory, lymph, extremities, GI, GU, musculoskeletal, constitutional, reproductive, HEENT. On the provided form, she reports ***. She denies *** and any other symptoms.    PHYSICAL EXAM:  vitals were not taken for this visit.  {may need to copy over vitals} Lungs are clear to auscultation bilaterally. Heart has regular rate and rhythm. No palpable cervical, supraclavicular, or axillary adenopathy. Abdomen soft, non-tender, normal bowel sounds. Breast: Right breast with no palpable mass, nipple discharge, or bleeding. Left breast with ***.   KPS = ***  100 - Normal; no complaints; no evidence of disease. 90   - Able to carry on normal activity; minor signs or symptoms of disease. 80   - Normal activity with effort; some signs or symptoms of disease. 10   - Cares for self; unable to carry on normal activity or to do active work. 60   - Requires occasional assistance, but is  able to care for most of his personal needs. 50   - Requires considerable assistance and frequent medical care. 61   - Disabled; requires special care and assistance. 23   - Severely disabled; hospital admission is indicated although death not imminent. 68   - Very sick; hospital admission necessary; active supportive treatment necessary. 10   - Moribund; fatal processes progressing rapidly. 0     - Dead  Karnofsky DA, [place], Craver LS and [place] [facility] [phone]) The use of the nitrogen mustards in the palliative treatment of carcinoma: with particular reference to bronchogenic carcinoma Cancer 1 [phone]  LABORATORY DATA:  Lab Results  Component Value Date   WBC 7.7 09/20/2020   HGB 13.8 09/20/2020   HCT 40.7 09/20/2020   MCV 91.9 09/20/2020   PLT 336 09/20/2020   Lab Results  Component Value Date   NA 136 09/20/2020   K 4.2 09/20/2020   CL 101 09/20/2020   CO2 23 09/20/2020   Lab Results  Component Value Date   ALT 15 10/08/2018   AST 20 10/08/2018   ALKPHOS 76 10/08/2018   BILITOT 0.6 10/08/2018    PULMONARY FUNCTION TEST:   Review Flowsheet        No data to display          RADIOGRAPHY: No results found.    IMPRESSION: Stage 0 (cTis (DCIS), cN0, cM0) Left Breast, Intermediate-grade DCIS, ER+ / PR+ / Her2 not assessed   Patient will be a good candidate for breast conservation with radiotherapy to the left breast. We discussed the general course of radiation, potential side effects, and toxicities with radiation and the patient is interested in this approach. ***   PLAN:  ***   ------------------------------------------------  Blair Promise, PhD, MD  This document serves as a record of services personally performed by Gery Pray, MD. It was created on his behalf by Roney Mans, a trained medical scribe. The creation of this record is based  on the scribe's personal observations and the provider's statements to them. This document has been checked and  approved by the attending provider.

## 2022-07-02 ENCOUNTER — Ambulatory Visit
Admission: RE | Admit: 2022-07-02 | Discharge: 2022-07-02 | Disposition: A | Discharge status: Home / Self Care | Payer: Medicare HMO | Source: Ambulatory Visit | Attending: Radiation Oncology | Admitting: Radiation Oncology

## 2022-07-02 ENCOUNTER — Other Ambulatory Visit: Payer: Self-pay | Admitting: *Deleted

## 2022-07-02 ENCOUNTER — Other Ambulatory Visit: Payer: Self-pay | Admitting: Surgery

## 2022-07-02 ENCOUNTER — Encounter: Payer: Self-pay | Admitting: Genetic Counselor

## 2022-07-02 ENCOUNTER — Encounter: Payer: Self-pay | Admitting: *Deleted

## 2022-07-02 ENCOUNTER — Ambulatory Visit: Payer: Medicare HMO | Admitting: Physical Therapy

## 2022-07-02 ENCOUNTER — Inpatient Hospital Stay (HOSPITAL_BASED_OUTPATIENT_CLINIC_OR_DEPARTMENT_OTHER): Payer: Medicare HMO | Admitting: Genetic Counselor

## 2022-07-02 ENCOUNTER — Inpatient Hospital Stay: Payer: Medicare HMO | Attending: Hematology and Oncology | Admitting: Hematology and Oncology

## 2022-07-02 ENCOUNTER — Inpatient Hospital Stay: Payer: Medicare HMO

## 2022-07-02 DIAGNOSIS — D0512 Intraductal carcinoma in situ of left breast: Secondary | ICD-10-CM

## 2022-07-02 DIAGNOSIS — M8580 Other specified disorders of bone density and structure, unspecified site: Secondary | ICD-10-CM | POA: Insufficient documentation

## 2022-07-02 DIAGNOSIS — Z79899 Other long term (current) drug therapy: Secondary | ICD-10-CM | POA: Diagnosis not present

## 2022-07-02 DIAGNOSIS — Z853 Personal history of malignant neoplasm of breast: Secondary | ICD-10-CM | POA: Diagnosis not present

## 2022-07-02 DIAGNOSIS — Z170 Estrogen receptor positive status [ER+]: Secondary | ICD-10-CM | POA: Diagnosis not present

## 2022-07-02 DIAGNOSIS — Z803 Family history of malignant neoplasm of breast: Secondary | ICD-10-CM | POA: Diagnosis not present

## 2022-07-02 LAB — GENETIC SCREENING ORDER

## 2022-07-02 LAB — CBC WITH DIFFERENTIAL (CANCER CENTER ONLY)
Abs Immature Granulocytes: 0.03 10*3/uL (ref 0.00–0.07)
Basophils Absolute: 0.1 10*3/uL (ref 0.0–0.1)
Basophils Relative: 1 %
Eosinophils Absolute: 0.1 10*3/uL (ref 0.0–0.5)
Eosinophils Relative: 1 %
HCT: 39.6 % (ref 36.0–46.0)
Hemoglobin: 13.3 g/dL (ref 12.0–15.0)
Immature Granulocytes: 0 %
Lymphocytes Relative: 28 %
Lymphs Abs: 2.5 10*3/uL (ref 0.7–4.0)
MCH: 30.3 pg (ref 26.0–34.0)
MCHC: 33.6 g/dL (ref 30.0–36.0)
MCV: 90.2 fL (ref 80.0–100.0)
Monocytes Absolute: 0.6 10*3/uL (ref 0.1–1.0)
Monocytes Relative: 7 %
Neutro Abs: 5.7 10*3/uL (ref 1.7–7.7)
Neutrophils Relative %: 63 %
Platelet Count: 300 10*3/uL (ref 150–400)
RBC: 4.39 MIL/uL (ref 3.87–5.11)
RDW: 12.7 % (ref 11.5–15.5)
WBC Count: 8.9 10*3/uL (ref 4.0–10.5)
nRBC: 0 % (ref 0.0–0.2)

## 2022-07-02 LAB — CMP (CANCER CENTER ONLY)
ALT: 10 U/L (ref 0–44)
AST: 15 U/L (ref 15–41)
Albumin: 4 g/dL (ref 3.5–5.0)
Alkaline Phosphatase: 77 U/L (ref 38–126)
Anion gap: 5 (ref 5–15)
BUN: 39 mg/dL — ABNORMAL HIGH (ref 8–23)
CO2: 27 mmol/L (ref 22–32)
Calcium: 10.3 mg/dL (ref 8.9–10.3)
Chloride: 104 mmol/L (ref 98–111)
Creatinine: 1.4 mg/dL — ABNORMAL HIGH (ref 0.44–1.00)
GFR, Estimated: 38 mL/min — ABNORMAL LOW (ref >60)
Glucose, Bld: 127 mg/dL — ABNORMAL HIGH (ref 70–99)
Potassium: 4.4 mmol/L (ref 3.5–5.1)
Sodium: 136 mmol/L (ref 135–145)
Total Bilirubin: 0.6 mg/dL (ref 0.3–1.2)
Total Protein: 7.4 g/dL (ref 6.5–8.1)

## 2022-07-02 NOTE — Progress Notes (Signed)
[place] NOTE  Patient Care Team: Gaynelle Arabian, MD as PCP - General (Family Medicine) Coralie Keens, MD as Consulting Physician (General Surgery) Nicholas Lose, MD as Consulting Physician (Hematology and Oncology) Gery Pray, MD as Consulting Physician (Radiation Oncology) Mauro Kaufmann, RN as Oncology Nurse Navigator Rockwell Germany, RN as Oncology Nurse Navigator  CHIEF COMPLAINTS/PURPOSE OF CONSULTATION:  Newly diagnosed breast cancer  HISTORY OF PRESENTING ILLNESS:  Alicia [name] 81 y.o. female is here because of recent diagnosis of left breast DCIS.  Patient had a routine screening mammogram that detected distortion in the left breast with calcifications measuring 1.9 cm.  There is no ultrasound correlate.  Axilla was negative.  Stereotactic biopsy revealed intermediate grade DCIS with cribriform and papillary features ER/PR positive.  She was presented this morning in the multidisciplinary tumor board and she is here today accompanied by her family to discuss her treatment plan.  I reviewed her records extensively and collaborated the history with the patient.  SUMMARY OF ONCOLOGIC HISTORY: Oncology History  Ductal carcinoma in situ (DCIS) of left breast  06/19/2022 Initial Diagnosis   Screening detected distortion with calcifications in the left breast central 12 o'clock position measuring 1.9 cm.  No ultrasound correlate.  Axilla negative.  Stereotactic biopsy revealed intermediate grade DCIS cribriform and papillary features ER 100%, PR 60%   07/02/2022 Cancer Staging   Staging form: Breast, AJCC 8th Edition - Clinical: Stage 0 (cTis (DCIS), cN0, cM0, G2, ER+, PR+) - Signed by Nicholas Lose, MD on 07/02/2022 Stage prefix: Initial diagnosis Histologic grading system: 3 grade system      MEDICAL HISTORY:  Past Medical History:  Diagnosis Date   Anxiety    Bursitis    GERD (gastroesophageal reflux disease)    Hypercholesteremia     Hypertension    Insomnia    Kidney stone    Osteoarthritis    Osteopenia    Restless leg syndrome    RLS (restless legs syndrome) 03/28/2016    SURGICAL HISTORY: Past Surgical History:  Procedure Laterality Date   ABDOMINAL HYSTERECTOMY     APPENDECTOMY     BLEPHAROPLASTY  2019   CATARACT EXTRACTION Right 02/2010   CHOLECYSTECTOMY     LEFT HEART CATH AND CORONARY ANGIOGRAPHY N/A 09/20/2020   Procedure: LEFT HEART CATH AND CORONARY ANGIOGRAPHY;  Surgeon: Lorretta Harp, MD;  Location: [facility];  Service: Cardiovascular;  Laterality: N/A;   ROTATOR CUFF REPAIR Right     SOCIAL HISTORY: Social History   Socioeconomic History   Marital status: Widowed    Spouse name: Not on file   Number of children: Not on file   Years of education: Not on file   Highest education level: Not on file  Occupational History   Not on file  Tobacco Use   Smoking status: Former    Packs/day: 0.50    Years: 20.00    Total pack years: 10.00    Types: Cigarettes    Quit date: 10/06/1996    Years since quitting: 25.7   Smokeless tobacco: Never  Vaping Use   Vaping Use: Never used  Substance and Sexual Activity   Alcohol use: No    Alcohol/week: 0.0 standard drinks of alcohol   Drug use: No   Sexual activity: Not on file  Other Topics Concern   Not on file  Social History Narrative   Lives alone in a one story home.  Has 3 children.  Semi retired.     Worked in [employer] for [employer].  Education: high school.   Social Determinants of Health   Financial Resource Strain: Not on file  Food Insecurity: Not on file  Transportation Needs: Not on file  Physical Activity: Not on file  Stress: Not on file  Social Connections: Not on file  Intimate Partner Violence: Not on file    FAMILY HISTORY: Family History  Problem Relation Age of Onset   Heart attack Mother    Depression Mother    Stroke Mother    Diabetes Mother    Heart attack Father    COPD Sister    COPD Sister     COPD Brother    COPD Brother    Restless legs syndrome Daughter    Restless legs syndrome Son    Restless legs syndrome Son    Breast cancer Maternal Aunt 60    ALLERGIES:  is allergic to ibuprofen, lexapro [escitalopram oxalate], lisinopril, mobic [meloxicam], nabumetone, prilosec otc [omeprazole magnesium], zoloft [sertraline hcl], and sulfa antibiotics.  MEDICATIONS:  Current Outpatient Medications  Medication Sig Dispense Refill   acetaminophen (TYLENOL) 650 MG CR tablet Take 1,300 mg by mouth every 8 (eight) hours as needed for pain.     amLODipine (NORVASC) 5 MG tablet Take 2.5 mg by mouth daily.     Bioflavonoid Products (BIOFLEX PO) Take 1 tablet by mouth daily.     celecoxib (CELEBREX) 200 MG capsule Take 200 mg by mouth daily.     Flax Oil-Fish Oil-Borage Oil (FISH-FLAX-BORAGE PO) Take 1 capsule by mouth daily.     fluticasone (FLONASE) 50 MCG/ACT nasal spray Place 1 spray into both nostrils daily. (Patient taking differently: Place 1 spray into both nostrils as needed.) 16 g 0   irbesartan (AVAPRO) 300 MG tablet Take 300 mg by mouth daily.     isosorbide mononitrate (IMDUR) 30 MG 24 hr tablet Take 1/2 (one-half) tablet by mouth once daily 45 tablet 2   loratadine (CLARITIN) 10 MG tablet Take 1 tablet (10 mg total) by mouth daily. 30 tablet 0   LORazepam (ATIVAN) 0.5 MG tablet Take 0.5 mg by mouth 2 (two) times daily as needed for anxiety.     meclizine (ANTIVERT) 25 MG tablet Take 25 mg by mouth 3 (three) times daily as needed for dizziness.     metoprolol succinate (TOPROL-XL) 50 MG 24 hr tablet Take 50 mg by mouth daily.     Multiple Minerals-Vitamins (CAL MAG ZINC +D3 PO) Take 1 tablet by mouth daily.     omeprazole (PRILOSEC) 40 MG capsule Take 40 mg by mouth daily.     rOPINIRole (REQUIP) 1 MG tablet Take 1 tablet (1 mg total) by mouth 3 (three) times daily. (Patient taking differently: Take 1-2 mg by mouth See admin instructions. Take 1 tablet (1 mg) by mouth in the  morning & take 2 tablets (2 mg) by mouth at night.) 270 tablet 3   simvastatin (ZOCOR) 20 MG tablet Take 20 mg by mouth every evening.     spironolactone (ALDACTONE) 25 MG tablet Take 25 mg by mouth daily.     traZODone (DESYREL) 50 MG tablet Take 50 mg by mouth at bedtime.      multivitamin-lutein (OCUVITE-LUTEIN) CAPS Take 830 capsules by mouth daily.     Current Facility-Administered Medications  Medication Dose Route Frequency Provider Last Rate Last Admin   sodium chloride flush (NS) 0.9 % injection 3 mL  3 mL Intravenous Q12H Gwenlyn Found,  Pearletha Forge, MD        REVIEW OF SYSTEMS:   Constitutional: Denies fevers, chills or abnormal night sweats Breast:  Denies any palpable lumps or discharge All other systems were reviewed with the patient and are negative.  PHYSICAL EXAMINATION: ECOG PERFORMANCE STATUS: 1 - Symptomatic but completely ambulatory  Vitals:   07/02/22 1223  BP: (!) 95/50  Pulse: 62  Resp: 18  Temp: (!) 97.3 F (36.3 C)  SpO2: 98%   Filed Weights   07/02/22 1223  Weight: 153 lb 12.8 oz (69.8 kg)    GENERAL:alert, no distress and comfortable    LABORATORY DATA:  I have reviewed the data as listed Lab Results  Component Value Date   WBC 8.9 07/02/2022   HGB 13.3 07/02/2022   HCT 39.6 07/02/2022   MCV 90.2 07/02/2022   PLT 300 07/02/2022   Lab Results  Component Value Date   NA 136 07/02/2022   K 4.4 07/02/2022   CL 104 07/02/2022   CO2 27 07/02/2022    RADIOGRAPHIC STUDIES: I have personally reviewed the radiological reports and agreed with the findings in the report.  ASSESSMENT AND PLAN:  Ductal carcinoma in situ (DCIS) of left breast 06/19/2022: Screening detected distortion with calcifications in the left breast central 12 o'clock position measuring 1.9 cm.  No ultrasound correlate.  Axilla negative.  Stereotactic biopsy revealed intermediate grade DCIS cribriform and papillary features ER 100%, PR 60%  Pathology review: I discussed with the  patient the difference between DCIS and invasive breast cancer. It is considered a precancerous lesion. DCIS is classified as a 0. It is generally detected through mammograms as calcifications. We discussed the significance of grades and its impact on prognosis. We also discussed the importance of ER and PR receptors and their implications to adjuvant treatment options. Prognosis of DCIS dependence on grade, comedo necrosis. It is anticipated that if not treated, 20-30% of DCIS can develop into invasive breast cancer.  Recommendation: 1. Breast conserving surgery 2. Followed by adjuvant radiation therapy 3. Followed by antiestrogen therapy with tamoxifen 5 years  Tamoxifen counseling: We discussed the risks and benefits of tamoxifen. These include but not limited to insomnia, hot flashes, mood changes, vaginal dryness, and weight gain. Although rare, serious side effects including endometrial cancer, risk of blood clots were also discussed. We strongly believe that the benefits far outweigh the risks. Patient understands these risks and consented to starting treatment. Planned treatment duration is 5 years.  Return to clinic after surgery to discuss the final pathology report and come up with an adjuvant treatment plan.    All questions were answered. The patient knows to call the clinic with any problems, questions or concerns.    Harriette Ohara, MD 07/02/22

## 2022-07-02 NOTE — Research (Signed)
MTG-015 - Tissue and Bodily Fluids: Translational Medicine: Discovery and Evaluation of Biomarkers/Pharmacogenomics for the Diagnosis and Personalized Management of Patients   Patient Alicia [name] was identified by Dr Lindi Adie as a potential candidate for the above listed study. This Clinical Research Nurse met with TONJIA PARILLO, [id number], on 07/02/22 in a manner and location that ensures patient privacy to discuss participation in the above listed research study. Patient is Accompanied by her family member . A copy of the informed consent document and separate HIPAA Authorization was provided to the patient. Patient reads, speaks, and understands [place]. Patient was provided with the business card of this Nurse and encouraged to contact the research team with any questions.  Approximately 15 minutes were spent with the patient reviewing the informed consent documents. Patient was provided the option of taking informed consent documents home to review and was encouraged to review at their convenience with their support network, including other care providers. Patient took the consent documents home to review.  Patient agreed to a follow-up call from research on Monday, 10/2.   Vickii Penna, RN, BSN, CPN Clinical Research Nurse I [phone]  07/02/2022 3:09 PM

## 2022-07-02 NOTE — Progress Notes (Signed)
[facility] Psychosocial Distress Screening Spiritual Care  Met with Alicia [name] and her daughter in [facility] to introduce [facility] team/resources, reviewing distress screen per protocol.  The patient scored a 4 on the Psychosocial Distress Thermometer which indicates moderate distress. Also assessed for distress and other psychosocial needs.      07/02/2022    4:09 PM  ONCBCN DISTRESS SCREENING  Distress experienced in past week (1-10) 4  Practical problem type Insurance  Emotional problem type Adjusting to illness  Physical Problem type Pain;Sleep/insomnia  Referral to support programs Yes   Lysle Morales, counseling intern, met with patient and her daughter in clinic. The patient reported feeling overwhelmed when she received the diagnosis but content and happy with the information she received today. The patient shared they experience insomnia and did not sleep well last night or the night before. The patient hopes to go home and nap.   The patient reported having a strong relationship with her daughter and a strong church community.   The patient has the counselor's card if needs arise.   Follow up needed: No.  Lysle Morales,  Counseling Intern

## 2022-07-02 NOTE — Progress Notes (Signed)
REFERRING PROVIDER: Nicholas Lose, MD Alicia [name], Alicia [name] [address]  PRIMARY PROVIDER:  Gaynelle Arabian, MD  PRIMARY REASON FOR VISIT:  1. Ductal carcinoma in situ (DCIS) of left breast     HISTORY OF PRESENT ILLNESS:   Alicia [name], a 81 y.o. female, was seen for a [facility] cancer genetics consultation at the request of Dr. Lindi Adie due to a personal and family history of cancer.  Alicia [name] presents to clinic today to discuss the possibility of a hereditary predisposition to cancer, to discuss genetic testing, and to further clarify her future cancer risks, as well as potential cancer risks for family members.   In September 2023, at the age of 61, Alicia [name] was diagnosed with ductal carcinoma in situ of the left breast. The tumor is describes as ER/PR positive.   CANCER HISTORY:  Oncology History  Ductal carcinoma in situ (DCIS) of left breast  06/30/2022 Initial Diagnosis   Ductal carcinoma in situ (DCIS) of left breast   07/02/2022 Cancer Staging   Staging form: Breast, AJCC 8th Edition - Clinical: Stage 0 (cTis (DCIS), cN0, cM0, G2, ER+, PR+) - Signed by Nicholas Lose, MD on 07/02/2022 Stage prefix: Initial diagnosis Histologic grading system: 3 grade system      RISK FACTORS:  Menarche was at age 41.  First live birth at age 55.  Ovaries intact: yes.  Uterus intact: no.  Menopausal status: postmenopausal.  HRT use: 1 year. Colonoscopy: yes;  2015 . Mammogram within the last year: yes. Any excessive radiation exposure in the past: no  Past Medical History:  Diagnosis Date   Anxiety    Bursitis    GERD (gastroesophageal reflux disease)    Hypercholesteremia    Hypertension    Insomnia    Kidney stone    Osteoarthritis    Osteopenia    Restless leg syndrome    RLS (restless legs syndrome) 03/28/2016    Past Surgical History:  Procedure Laterality Date   ABDOMINAL HYSTERECTOMY     APPENDECTOMY     BLEPHAROPLASTY  2019   CATARACT EXTRACTION  Right 02/2010   CHOLECYSTECTOMY     LEFT HEART CATH AND CORONARY ANGIOGRAPHY N/A 09/20/2020   Procedure: LEFT HEART CATH AND CORONARY ANGIOGRAPHY;  Surgeon: Lorretta Harp, MD;  Location: [facility];  Service: Cardiovascular;  Laterality: N/A;   ROTATOR CUFF REPAIR Right     Social History   Socioeconomic History   Marital status: Widowed    Spouse name: Not on file   Number of children: Not on file   Years of education: Not on file   Highest education level: Not on file  Occupational History   Not on file  Tobacco Use   Smoking status: Former    Packs/day: 0.50    Years: 20.00    Total pack years: 10.00    Types: Cigarettes    Quit date: 10/06/1996    Years since quitting: 25.7   Smokeless tobacco: Never  Vaping Use   Vaping Use: Never used  Substance and Sexual Activity   Alcohol use: No    Alcohol/week: 0.0 standard drinks of alcohol   Drug use: No   Sexual activity: Not on file  Other Topics Concern   Not on file  Social History Narrative   Lives alone in a one story home.  Has 3 children.     Semi retired.     Worked in [place] for [employer].  Education: high school.  Social Determinants of Health   Financial Resource Strain: Not on file  Food Insecurity: Not on file  Transportation Needs: Not on file  Physical Activity: Not on file  Stress: Not on file  Social Connections: Not on file     FAMILY HISTORY:  We obtained a detailed, 4-generation family history.  Significant diagnoses are listed below:  Alicia [name] maternal aunt was diagnosed with breast cancer at age 27. She has limited information about her paternal family medical history. Alicia [name] is unaware of previous family history of genetic testing for hereditary cancer risks. There is no reported Ashkenazi Jewish ancestry.       GENETIC COUNSELING ASSESSMENT: Alicia [name] is a 81 y.o. female with a personal and family history of cancer which is somewhat suggestive of a hereditary predisposition  to cancer. We, therefore, discussed and recommended the following at today's visit.   DISCUSSION: We discussed that 5 - 10% of cancer is hereditary, with most cases of breast cancer associated with BRCA1/2.  There are other genes that can be associated with hereditary breast cancer syndromes.  We discussed that testing is beneficial for several reasons including knowing how to follow individuals after completing their treatment, identifying whether potential treatment options would be beneficial, and understanding if other family members could be at risk for cancer and allowing them to undergo genetic testing.   We reviewed the characteristics, features and inheritance patterns of hereditary cancer syndromes. We also discussed genetic testing, including the appropriate family members to test, the process of testing, insurance coverage and turn-around-time for results. We discussed the implications of a negative, positive, carrier and/or variant of uncertain significant result. We recommended Alicia [name] pursue genetic testing for a panel that includes genes associated with breast cancer.   Alicia [name] elected to have [facility] CustomNext Panel. The CustomNext-Cancer+RNAinsight panel offered by [facility] includes sequencing and rearrangement analysis for the following 47 genes:  APC, ATM, AXIN2, BARD1, BMPR1A, BRCA1, BRCA2, BRIP1, CDH1, CDK4, CDKN2A, CHEK2, CTNNA1, DICER1, EPCAM, GREM1, HOXB13, KIT, MEN1, MLH1, MSH2, MSH3, MSH6, MUTYH, NBN, NF1, NTHL1, PALB2, PDGFRA, PMS2, POLD1, POLE, PTEN, RAD50, RAD51C, RAD51D, SDHA, SDHB, SDHC, SDHD, SMAD4, SMARCA4, STK11, TP53, TSC1, TSC2, and VHL.  RNA data is routinely analyzed for use in variant interpretation for all genes.  Based on Alicia [name]'s personal and family history of cancer, she does not meet medical criteria for genetic testing. She may have an out of pocket cost. We discussed that if her out of pocket cost for testing is over $100, the laboratory will call  and confirm whether she wants to proceed with testing.  If the out of pocket cost of testing is less than $100 she will be billed by the genetic testing laboratory.   PLAN: After considering the risks, benefits, and limitations, Alicia [name] provided informed consent to pursue genetic testing and the blood sample was sent to [facility] for analysis of the CustomNext Panel Results should be available within approximately 2-3 weeks' time, at which point they will be disclosed by telephone to Alicia [name], as will any additional recommendations warranted by these results. Alicia [name] will receive a summary of her genetic counseling visit and a copy of her results once available. This information will also be available in Epic.   Alicia [name] questions were answered to her satisfaction today. Our contact information was provided should additional questions or concerns arise. Thank you for the referral and allowing [place] to share in the care of your patient.  Lucille Passy, MS, [facility] Genetic Counselor [email or web address] (P) [phone]  The patient was seen for a total of 20 minutes in face-to-face genetic counseling.  The patient brought her daughter. Drs. Lindi Adie and/or Burr Medico were available to discuss this case as needed.   _______________________________________________________________________ For Office Staff:  Number of people involved in session: 2 Was an Intern/ student involved with case: No

## 2022-07-02 NOTE — Assessment & Plan Note (Signed)
06/19/2022: Screening detected distortion with calcifications in the left breast central 12 o'clock position measuring 1.9 cm.  No ultrasound correlate.  Axilla negative.  Stereotactic biopsy revealed intermediate grade DCIS cribriform and papillary features ER 100%, PR 60%  Pathology review: I discussed with the patient the difference between DCIS and invasive breast cancer. It is considered a precancerous lesion. DCIS is classified as a 0. It is generally detected through mammograms as calcifications. We discussed the significance of grades and its impact on prognosis. We also discussed the importance of ER and PR receptors and their implications to adjuvant treatment options. Prognosis of DCIS dependence on grade, comedo necrosis. It is anticipated that if not treated, 20-30% of DCIS can develop into invasive breast cancer.  Recommendation: 1. Breast conserving surgery 2. Followed by adjuvant radiation therapy 3. Followed by antiestrogen therapy with tamoxifen 5 years  Tamoxifen counseling: We discussed the risks and benefits of tamoxifen. These include but not limited to insomnia, hot flashes, mood changes, vaginal dryness, and weight gain. Although rare, serious side effects including endometrial cancer, risk of blood clots were also discussed. We strongly believe that the benefits far outweigh the risks. Patient understands these risks and consented to starting treatment. Planned treatment duration is 5 years.  Return to clinic after surgery to discuss the final pathology report and come up with an adjuvant treatment plan.

## 2022-07-04 ENCOUNTER — Encounter: Payer: Self-pay | Admitting: *Deleted

## 2022-07-04 DIAGNOSIS — D0512 Intraductal carcinoma in situ of left breast: Secondary | ICD-10-CM

## 2022-07-07 ENCOUNTER — Telehealth: Payer: Self-pay | Admitting: *Deleted

## 2022-07-07 NOTE — Telephone Encounter (Signed)
[id number]  Patient returned call and states she has not had time to read the consent form yet but does want to participate. Spent 5 minutes reviewing the study including blood draw, data collection and the voluntary nature of the study.  Patient agreed to make appointment to meet with research nurse for consent with lab appointment to follow this Thursday 10/5 at 10:00 am.  Encouraged patient to read the consent form prior to appointment and to call research nurse back if any questions. Thanked patient for her support of this study. She verbalized understanding.  Foye Spurling, BSN, RN, [place] Nurse II [phone] 07/07/2022 3:46 PM

## 2022-07-07 NOTE — Telephone Encounter (Signed)
MT3505-A Follow Up Call: LVM for patient to follow up on her interest in the MT3505-A study to see if she has any questions and if she would like to participate. Left my phone number and requested patient return call at her convenience.  Foye Spurling, BSN, RN, [place] Nurse II [phone] 07/07/2022 2:08 PM

## 2022-07-08 ENCOUNTER — Encounter (HOSPITAL_BASED_OUTPATIENT_CLINIC_OR_DEPARTMENT_OTHER): Payer: Self-pay | Admitting: Surgery

## 2022-07-08 ENCOUNTER — Other Ambulatory Visit: Payer: Self-pay

## 2022-07-08 NOTE — Progress Notes (Signed)
   07/08/22 1210  PAT Phone Screen  Do You Have Diabetes? No  Do You Have Hypertension? Yes  Have You Ever Been to the ER for Asthma? No  Have You Taken Oral Steroids in the Past 3 Months? No  Do you Take Phenteramine or any Other Diet Drugs? No  Recent  Lab Work, EKG, CXR? Yes  Where was this test performed? 12-03-21 EKG, 07-02-22 had CBC/diff, CMP  Do you have a history of heart problems? (S)  Yes;Cardiologist (normal cath 09-20-20, pain seems reflux related)  Have You Ever Had Tests on Your Heart? Yes  Where? 09-20-20 Heart cath EF 55-65%, normal coronaries  Any Recent Hospitalizations? No  Height '5\' 1"'$  (1.549 m)  Weight 69.9 kg  Pat Appointment Scheduled No  Reason for No Appointment Not Needed

## 2022-07-10 ENCOUNTER — Telehealth: Payer: Self-pay | Admitting: *Deleted

## 2022-07-10 ENCOUNTER — Inpatient Hospital Stay: Payer: Medicare HMO

## 2022-07-10 ENCOUNTER — Encounter: Payer: Self-pay | Admitting: *Deleted

## 2022-07-10 ENCOUNTER — Inpatient Hospital Stay: Payer: Medicare HMO | Attending: Hematology and Oncology | Admitting: *Deleted

## 2022-07-10 ENCOUNTER — Encounter: Payer: Medicare HMO | Admitting: *Deleted

## 2022-07-10 ENCOUNTER — Other Ambulatory Visit: Payer: Medicare HMO

## 2022-07-10 DIAGNOSIS — Z79899 Other long term (current) drug therapy: Secondary | ICD-10-CM | POA: Insufficient documentation

## 2022-07-10 DIAGNOSIS — D0512 Intraductal carcinoma in situ of left breast: Secondary | ICD-10-CM | POA: Insufficient documentation

## 2022-07-10 DIAGNOSIS — C50912 Malignant neoplasm of unspecified site of left female breast: Secondary | ICD-10-CM | POA: Diagnosis not present

## 2022-07-10 DIAGNOSIS — Z006 Encounter for examination for normal comparison and control in clinical research program: Secondary | ICD-10-CM

## 2022-07-10 LAB — RESEARCH LABS

## 2022-07-10 NOTE — Research (Unsigned)
Trial Name:  MTG-015 - Tissue and Bodily Fluids: Translational Medicine: Discovery and Evaluation of Biomarkers/Pharmacogenomics for the Diagnosis and Personalized Management of Patients    Patient Alicia [name] was identified by Dr. Lindi Adie as a potential candidate for the above listed study.  This Clinical Research Nurse met with Alicia [name], [id number] on 07/10/22 in a manner and location that ensures patient privacy to discuss participation in the above listed research study.  Patient is Unaccompanied.  Patient was previously provided with informed consent documents.  Patient confirmed they have read the informed consent documents.  As outlined in the informed consent form, this Nurse and Bertha Stakes discussed the purpose of the research study, the investigational nature of the study, study procedures and requirements for study participation, potential risks and benefits of study participation, as well as alternatives to participation.  This study is not blinded or double-blinded. The patient understands participation is voluntary and they may withdraw from study participation at any time.  This study does not involve randomization.  This study does not involve an investigational drug or device. This study does not involve a placebo. Patient understands enrollment is pending full eligibility review.   Confidentiality and how the patient's information will be used as part of study participation were discussed.  Patient was informed there is reimbursement provided for their time and effort spent on trial participation.  The patient is encouraged to discuss research study participation with their insurance provider to determine what costs they may incur as part of study participation, including research related injury.    All questions were answered to patient's satisfaction.  The informed consent and separate HIPAA Authorization was reviewed page by page.  The patient's mental and emotional status is  appropriate to provide informed consent, and the patient verbalizes an understanding of study participation.  Patient has agreed to participate in the above listed research study and has voluntarily signed the informed consent version IRB approved Aug 26, 2021  and separate HIPAA Authorization, version 5, revised 09/21/19  on 07/10/22 at 9:10AM.  The patient was provided with a copy of the signed informed consent form and separate HIPAA Authorization for their reference.  No study specific procedures were obtained prior to the signing of the informed consent document.  Approximately 15 minutes were spent with the patient reviewing the informed consent documents.  Patient was not requested to complete a Release of Information form.   Eligibility: This Nurse has reviewed this patient's inclusion and exclusion criteria and confirmed Alicia [name] is eligible for study participation.  Patient will continue with enrollment. Eligibility confirmed by treating investigator, who also agrees that patient should proceed with enrollment.    Data Collection:   The following social, medical, and family history was collected from the patient on 07/10/22.  Date and time of last meal prior to blood collection: 8:00 am   Does the patient drink alcohol? No  Does the patient use tobacco products? Former.  Date quit: 1993; Number of years smoked: 22; Packs per day 1.  Menopausal status? postmenopausal  Date of last menstrual cycle? 1980 (hysterectomy 1980)  Does the patient have a personal history of cancer? No    Does the patient have family history of cancer in immediate family (grandparents, parents, and/or siblings)? No    Has the patient received a COVID-19 vaccination? Yes   If yes, vaccine manufacturer? [place]  Number of doses received? 4  Date of last dose? 04/04/2021  Has the patient ever  tested positive for COVID? Yes   If yes, date of last positive COVID test? 09/2021  Date of last COVID test  taken? 09/2021  Has the patient received any other vaccines in the past year? No    The patient's medication list was reviewed with the patient and it was correct although start dates were unable to be obtained on most of her medications. Patient knew the year that 2 of her medications started (Isosorbide in 2021 and Lorazepam in 2014). The rest she states she has been on for "years and years" and and start dates are unknown, including her supplements.  Has the patient stopped any medications in the past 30 days? No  Patient's medical history also reviewed and start dates (years) for diagnoses obtained to the best of patient's memory.    Blood Collection: Research blood obtained by [facility] venipuncture. Patient tolerated well without any adverse events.  Gift Card: $50 gift card given to patient for her participation in this study.    Patient thanked for her participation and encouraged to call with any questions.    Foye Spurling, BSN, RN, [place] Nurse II [phone] 07/10/2022

## 2022-07-10 NOTE — Telephone Encounter (Signed)
Spoke with patient to follow up from [facility] 9/27 and assess navigation needs. Patient denies any questions or concerns at this time. Encouraged her to call should anything arise. Patient verbalized understanding.

## 2022-07-10 NOTE — Research (Unsigned)
MTG-015 - Tissue and Bodily Fluids: Translational Medicine: Discovery and Evaluation of Biomarkers/Pharmacogenomics for the Diagnosis and Personalized Management of Patients    07/10/2022  SECOND REVIEW:  This Coordinator has reviewed this patient's inclusion and exclusion criteria as a second review and confirms Alicia [name] is eligible for study participation.  Patient may continue with enrollment.   Carol Ada, RT(R)(T) Clinical Research Coordinator

## 2022-07-11 ENCOUNTER — Encounter: Payer: Self-pay | Admitting: *Deleted

## 2022-07-14 ENCOUNTER — Inpatient Hospital Stay
Admission: RE | Admit: 2022-07-14 | Discharge: 2022-07-14 | Disposition: A | Discharge status: Home / Self Care | Payer: Self-pay | Source: Ambulatory Visit | Attending: Radiation Oncology | Admitting: Radiation Oncology

## 2022-07-14 ENCOUNTER — Other Ambulatory Visit: Payer: Self-pay | Admitting: Radiation Oncology

## 2022-07-14 ENCOUNTER — Encounter: Payer: Self-pay | Admitting: Genetic Counselor

## 2022-07-14 ENCOUNTER — Ambulatory Visit
Admission: RE | Admit: 2022-07-14 | Discharge: 2022-07-14 | Disposition: A | Discharge status: Home / Self Care | Payer: Self-pay | Source: Ambulatory Visit | Attending: Radiation Oncology | Admitting: Radiation Oncology

## 2022-07-14 ENCOUNTER — Telehealth: Payer: Self-pay | Admitting: Genetic Counselor

## 2022-07-14 DIAGNOSIS — D0512 Intraductal carcinoma in situ of left breast: Secondary | ICD-10-CM

## 2022-07-14 DIAGNOSIS — Z1379 Encounter for other screening for genetic and chromosomal anomalies: Secondary | ICD-10-CM | POA: Insufficient documentation

## 2022-07-14 NOTE — Telephone Encounter (Signed)
I contacted Ms. Rivenbark to discuss her genetic testing results. No pathogenic variants were identified in the 47 genes analyzed. Detailed clinic note to follow.  The test report has been scanned into EPIC and is located under the Molecular Pathology section of the Results Review tab.  A portion of the result report is included below for reference.   Lucille Passy, MS, [facility] Genetic Counselor [email or web address] (P) [phone]

## 2022-07-15 DIAGNOSIS — D0512 Intraductal carcinoma in situ of left breast: Secondary | ICD-10-CM | POA: Diagnosis not present

## 2022-07-15 MED ORDER — CHLORHEXIDINE GLUCONATE CLOTH 2 % EX PADS: 6.0000 | MEDICATED_PAD | Freq: Once | CUTANEOUS | Status: DC

## 2022-07-15 MED ORDER — ENSURE PRE-SURGERY PO LIQD: 296.0000 mL | Freq: Once | ORAL | Status: DC

## 2022-07-15 NOTE — H&P (Signed)
REFERRING PHYSICIAN: Rulon Eisenmenger, MD  PROVIDER: Beverlee Nims, MD  MRN: [id number] DOB: Jan 19, 1941  Subjective   Chief Complaint: Breast Cancer   History of Present Illness: Alicia [name] is a 81 y.o. female who is seen  as an office consultation for evaluation of Breast Cancer .   This is a pleasant 81 year old female was found on recent screening mammography to have a distortion with calcifications centrally in the left breast. On ultrasound the area measured 1.9 cm. Ultrasound the axilla was negative. She underwent a biopsy showing ductal carcinoma in situ arising from a complex sclerosing lesion which was intermediate grade. It was 100% ER positive and 60% PR positive. She has had no problems regarding her breast in the past. She denies nipple discharge. She has no cardiopulmonary issues. She has no previous problems with general anesthesia.  Review of Systems: A complete review of systems was obtained from the patient. I have reviewed this information and discussed as appropriate with the patient. See HPI as well for other ROS.  ROS   Medical History: Past Medical History:  Diagnosis Date  Anxiety  GERD (gastroesophageal reflux disease)  Hyperlipidemia  Hypertension   Patient Active Problem List  Diagnosis  Ductal carcinoma in situ (DCIS) of left breast   Past Surgical History:  Procedure Laterality Date  L heart cath and coronary angiography 09/20/2020  APPENDECTOMY  Unknown date  BLEPHAROPLASTY  Unknown date  CATARACT EXTRACTION N/A  Unknown date  HYSTERECTOMY VAGINAL  Unknown date    Allergies  Allergen Reactions  Escitalopram Other (See Comments) and Unknown  GERD  Ibuprofen Other (See Comments)  GI UPSET  Lisinopril Cough and Unknown  Meloxicam Other (See Comments)  LEG PAIN  Nabumetone Other (See Comments)  GI UPSET  Omeprazole Other (See Comments)  ARM SPASM  Sertraline Other (See Comments)  INSOMNIA  Sulfa (Sulfonamide Antibiotics)  Rash  Sulfasalazine Rash   Current Outpatient Medications on File Prior to Visit  Medication Sig Dispense Refill  amLODIPine (NORVASC) 2.5 MG tablet Take 2.5 mg by mouth once daily  celecoxib (CELEBREX) 200 MG capsule Take 200 mg by mouth once daily  cyclobenzaprine (FLEXERIL) 10 MG tablet Take 10 mg by mouth 3 (three) times daily as needed  fluticasone propionate (FLONASE) 50 mcg/actuation nasal spray Place 1 spray into both nostrils once daily as needed  irbesartan (AVAPRO) 300 MG tablet Take 300 mg by mouth once daily  isosorbide mononitrate (IMDUR) 30 MG ER tablet Take 0.5 tablets by mouth once daily  LORazepam (ATIVAN) 0.5 MG tablet Take 1-1.5 tablets by mouth 2 (two) times daily as needed  meclizine (ANTIVERT) 25 mg tablet Take 1-2 tablets by mouth 3 (three) times daily as needed  metoprolol succinate (TOPROL-XL) 50 MG XL tablet Take 50 mg by mouth once daily  omeprazole (PRILOSEC) 40 MG DR capsule Take 40 mg by mouth once daily  rOPINIRole (REQUIP) 1 MG tablet Take 1 mg by mouth 3 (three) times daily  spironolactone (ALDACTONE) 25 MG tablet Take 25 mg by mouth once daily  traZODone (DESYREL) 50 MG tablet Take 50 mg by mouth at bedtime   No current facility-administered medications on file prior to visit.   Family History  Problem Relation Age of Onset  Diabetes Mother  Stroke Mother    Social History   Tobacco Use  Smoking Status Former  Types: Cigarettes  Quit date: 1998  Years since quitting: 25.7  Smokeless Tobacco Never    Social History   Socioeconomic History  Marital status: Single  Tobacco Use  Smoking status: Former  Types: Cigarettes  Quit date: 1998  Years since quitting: 25.7  Smokeless tobacco: Never  Vaping Use  Vaping Use: Never used  Substance and Sexual Activity  Alcohol use: Never  Drug use: Never  Sexual activity: Defer   Objective:  There were no vitals filed for this visit.  There is no height or weight on file to calculate  BMI.  Physical Exam   she appears well on exam  There are no palpable breast masses in either breast. The areolar complexes are normal. No bruising from her biopsy.  There is no axillary adenopathy on either side  Labs, Imaging and Diagnostic Testing: Reviewed her mammograms, ultrasound, and pathology results  Assessment and Plan:   Diagnoses and all orders for this visit:  Ductal carcinoma in situ (DCIS) of left breast    We will discuss her on multidisciplinary breast cancer conference and I have discussed the diagnosis with the patient and her family. We discussed DCIS in general. From a surgical standpoint we then discussed breast conservation versus mastectomy. She is interested in breast conservation. We next discussed proceeding with a radioactive seed guided lumpectomy. I explained the procedure in detail. We discussed the risks which includes but is not limited to bleeding, infection, injury to surrounding structures, the need for further surgery if margins are positive, cardiopulmonary issues, postoperative recovery, etc. They understand and she wished to proceed with surgery which will be scheduled

## 2022-07-15 NOTE — Progress Notes (Signed)
       Patient Instructions  The night before surgery:  No food after midnight. ONLY clear liquids after midnight  The day of surgery (if you do NOT have diabetes):  Drink ONE (1) Pre-Surgery Clear Ensure as directed.   This drink was given to you during your hospital  pre-op appointment visit. The pre-op nurse will instruct you on the time to drink the  Pre-Surgery Ensure depending on your surgery time. Finish the drink at the designated time by the pre-op nurse.  Nothing else to drink after completing the  Pre-Surgery Clear Ensure.  The day of surgery (if you have diabetes): Drink ONE (1) Gatorade 2 (G2) as directed. This drink was given to you during your hospital  pre-op appointment visit.  The pre-op nurse will instruct you on the time to drink the   Gatorade 2 (G2) depending on your surgery time. Color of the Gatorade may vary. Red is not allowed. Nothing else to drink after completing the  Gatorade 2 (G2).         If you have questions, please contact your surgeon's office.  Surgical soap given to patient, instructions given, patient verbalized understanding.  

## 2022-07-16 ENCOUNTER — Encounter (HOSPITAL_BASED_OUTPATIENT_CLINIC_OR_DEPARTMENT_OTHER): Payer: Self-pay | Admitting: Surgery

## 2022-07-16 ENCOUNTER — Ambulatory Visit (HOSPITAL_BASED_OUTPATIENT_CLINIC_OR_DEPARTMENT_OTHER): Payer: Medicare HMO | Admitting: Certified Registered"

## 2022-07-16 ENCOUNTER — Other Ambulatory Visit: Payer: Self-pay

## 2022-07-16 ENCOUNTER — Ambulatory Visit (HOSPITAL_BASED_OUTPATIENT_CLINIC_OR_DEPARTMENT_OTHER)
Admission: RE | Admit: 2022-07-16 | Discharge: 2022-07-16 | Disposition: A | Discharge status: Home / Self Care | Payer: Medicare HMO | Attending: Surgery | Admitting: Surgery

## 2022-07-16 ENCOUNTER — Encounter (HOSPITAL_BASED_OUTPATIENT_CLINIC_OR_DEPARTMENT_OTHER)
Admission: RE | Disposition: A | Discharge status: Home / Self Care | Payer: Self-pay | Source: Home / Self Care | Attending: Surgery

## 2022-07-16 DIAGNOSIS — K219 Gastro-esophageal reflux disease without esophagitis: Secondary | ICD-10-CM | POA: Diagnosis not present

## 2022-07-16 DIAGNOSIS — N649 Disorder of breast, unspecified: Secondary | ICD-10-CM | POA: Diagnosis not present

## 2022-07-16 DIAGNOSIS — R921 Mammographic calcification found on diagnostic imaging of breast: Secondary | ICD-10-CM | POA: Diagnosis not present

## 2022-07-16 DIAGNOSIS — F419 Anxiety disorder, unspecified: Secondary | ICD-10-CM | POA: Insufficient documentation

## 2022-07-16 DIAGNOSIS — D0512 Intraductal carcinoma in situ of left breast: Secondary | ICD-10-CM | POA: Insufficient documentation

## 2022-07-16 DIAGNOSIS — C50912 Malignant neoplasm of unspecified site of left female breast: Secondary | ICD-10-CM | POA: Diagnosis not present

## 2022-07-16 DIAGNOSIS — Z87891 Personal history of nicotine dependence: Secondary | ICD-10-CM | POA: Diagnosis not present

## 2022-07-16 DIAGNOSIS — I10 Essential (primary) hypertension: Secondary | ICD-10-CM | POA: Insufficient documentation

## 2022-07-16 DIAGNOSIS — Z170 Estrogen receptor positive status [ER+]: Secondary | ICD-10-CM | POA: Diagnosis not present

## 2022-07-16 DIAGNOSIS — R69 Illness, unspecified: Secondary | ICD-10-CM | POA: Diagnosis not present

## 2022-07-16 DIAGNOSIS — Z01818 Encounter for other preprocedural examination: Secondary | ICD-10-CM

## 2022-07-16 DIAGNOSIS — N6489 Other specified disorders of breast: Secondary | ICD-10-CM | POA: Insufficient documentation

## 2022-07-16 DIAGNOSIS — N641 Fat necrosis of breast: Secondary | ICD-10-CM | POA: Diagnosis not present

## 2022-07-16 HISTORY — PX: BREAST LUMPECTOMY WITH RADIOACTIVE SEED LOCALIZATION: SHX6424

## 2022-07-16 HISTORY — DX: Dyspnea, unspecified: R06.00

## 2022-07-16 HISTORY — DX: Angina pectoris, unspecified: I20.9

## 2022-07-16 SURGERY — BREAST LUMPECTOMY WITH RADIOACTIVE SEED LOCALIZATION
Anesthesia: General | Site: Breast | Laterality: Left

## 2022-07-16 MED ORDER — ONDANSETRON HCL 4 MG/2ML IJ SOLN
INTRAMUSCULAR | Status: AC
  Filled 2022-07-16: qty 2

## 2022-07-16 MED ORDER — 0.9 % SODIUM CHLORIDE (POUR BTL) OPTIME
TOPICAL | Status: DC | PRN
  Administered 2022-07-16: 100 mL

## 2022-07-16 MED ORDER — ONDANSETRON HCL 4 MG/2ML IJ SOLN: 4.0000 mg | Freq: Once | INTRAMUSCULAR | Status: DC | PRN

## 2022-07-16 MED ORDER — ALBUMIN HUMAN 5 % IV SOLN: INTRAVENOUS | Status: DC | PRN

## 2022-07-16 MED ORDER — PROPOFOL 10 MG/ML IV BOLUS
INTRAVENOUS | Status: DC | PRN
  Administered 2022-07-16: 150 mg via INTRAVENOUS

## 2022-07-16 MED ORDER — PHENYLEPHRINE HCL (PRESSORS) 10 MG/ML IV SOLN
INTRAVENOUS | Status: AC
  Filled 2022-07-16: qty 1

## 2022-07-16 MED ORDER — ACETAMINOPHEN 500 MG PO TABS
1000.0000 mg | ORAL_TABLET | ORAL | Status: AC
  Administered 2022-07-16: 1000 mg via ORAL

## 2022-07-16 MED ORDER — FENTANYL CITRATE (PF) 100 MCG/2ML IJ SOLN
INTRAMUSCULAR | Status: AC
  Filled 2022-07-16: qty 2

## 2022-07-16 MED ORDER — ACETAMINOPHEN 500 MG PO TABS
ORAL_TABLET | ORAL | Status: AC
  Filled 2022-07-16: qty 2

## 2022-07-16 MED ORDER — LIDOCAINE 2% (20 MG/ML) 5 ML SYRINGE
INTRAMUSCULAR | Status: AC
  Filled 2022-07-16: qty 5

## 2022-07-16 MED ORDER — LIDOCAINE HCL (CARDIAC) PF 100 MG/5ML IV SOSY
PREFILLED_SYRINGE | INTRAVENOUS | Status: DC | PRN
  Administered 2022-07-16: 60 mg via INTRAVENOUS

## 2022-07-16 MED ORDER — LACTATED RINGERS IV SOLN: INTRAVENOUS | Status: DC | PRN

## 2022-07-16 MED ORDER — CEFAZOLIN SODIUM-DEXTROSE 2-4 GM/100ML-% IV SOLN
INTRAVENOUS | Status: AC
  Filled 2022-07-16: qty 100

## 2022-07-16 MED ORDER — PROPOFOL 10 MG/ML IV BOLUS
INTRAVENOUS | Status: AC
  Filled 2022-07-16: qty 20

## 2022-07-16 MED ORDER — EPHEDRINE SULFATE (PRESSORS) 50 MG/ML IJ SOLN
INTRAMUSCULAR | Status: DC | PRN
  Administered 2022-07-16: 15 mg via INTRAVENOUS

## 2022-07-16 MED ORDER — BUPIVACAINE-EPINEPHRINE 0.5% -1:200000 IJ SOLN
INTRAMUSCULAR | Status: DC | PRN
  Administered 2022-07-16: 15 mL

## 2022-07-16 MED ORDER — LACTATED RINGERS IV SOLN: INTRAVENOUS | Status: DC

## 2022-07-16 MED ORDER — TRAMADOL HCL 50 MG PO TABS: 50.0000 mg | ORAL_TABLET | Freq: Four times a day (QID) | ORAL | 0 refills | Status: DC | PRN

## 2022-07-16 MED ORDER — DEXAMETHASONE SODIUM PHOSPHATE 10 MG/ML IJ SOLN
INTRAMUSCULAR | Status: AC
  Filled 2022-07-16: qty 1

## 2022-07-16 MED ORDER — FENTANYL CITRATE (PF) 100 MCG/2ML IJ SOLN
25.0000 ug | INTRAMUSCULAR | Status: DC | PRN
  Administered 2022-07-16: 25 ug via INTRAVENOUS

## 2022-07-16 MED ORDER — ONDANSETRON HCL 4 MG/2ML IJ SOLN
INTRAMUSCULAR | Status: DC | PRN
  Administered 2022-07-16: 4 mg via INTRAVENOUS

## 2022-07-16 MED ORDER — AMISULPRIDE (ANTIEMETIC) 5 MG/2ML IV SOLN: 10.0000 mg | Freq: Once | INTRAVENOUS | Status: DC | PRN

## 2022-07-16 MED ORDER — DEXAMETHASONE SODIUM PHOSPHATE 10 MG/ML IJ SOLN
INTRAMUSCULAR | Status: DC | PRN
  Administered 2022-07-16: 5 mg via INTRAVENOUS

## 2022-07-16 MED ORDER — FENTANYL CITRATE (PF) 100 MCG/2ML IJ SOLN
INTRAMUSCULAR | Status: DC | PRN
  Administered 2022-07-16: 50 ug via INTRAVENOUS

## 2022-07-16 MED ORDER — PHENYLEPHRINE HCL-NACL 20-0.9 MG/250ML-% IV SOLN
INTRAVENOUS | Status: DC | PRN
  Administered 2022-07-16: 35 ug/min via INTRAVENOUS

## 2022-07-16 MED ORDER — CEFAZOLIN SODIUM-DEXTROSE 2-4 GM/100ML-% IV SOLN
2.0000 g | INTRAVENOUS | Status: AC
  Administered 2022-07-16: 2 g via INTRAVENOUS

## 2022-07-16 SURGICAL SUPPLY — 47 items
ADH SKN CLS APL DERMABOND .7 (GAUZE/BANDAGES/DRESSINGS) ×1
APL PRP STRL LF DISP 70% ISPRP (MISCELLANEOUS) ×1
APPLIER CLIP 9.375 MED OPEN (MISCELLANEOUS)
APR CLP MED 9.3 20 MLT OPN (MISCELLANEOUS)
BINDER BREAST 3XL (GAUZE/BANDAGES/DRESSINGS) IMPLANT
BINDER BREAST LRG (GAUZE/BANDAGES/DRESSINGS) IMPLANT
BINDER BREAST MEDIUM (GAUZE/BANDAGES/DRESSINGS) IMPLANT
BINDER BREAST XLRG (GAUZE/BANDAGES/DRESSINGS) IMPLANT
BINDER BREAST XXLRG (GAUZE/BANDAGES/DRESSINGS) IMPLANT
BLADE SURG 15 STRL LF DISP TIS (BLADE) ×2 IMPLANT
BLADE SURG 15 STRL SS (BLADE) ×1
CANISTER SUC SOCK COL 7IN (MISCELLANEOUS) IMPLANT
CANISTER SUCT 1200ML W/VALVE (MISCELLANEOUS) IMPLANT
CHLORAPREP W/TINT 26 (MISCELLANEOUS) ×1 IMPLANT
CLIP APPLIE 9.375 MED OPEN (MISCELLANEOUS) IMPLANT
COVER BACK TABLE 60X90IN (DRAPES) ×1 IMPLANT
COVER MAYO STAND STRL (DRAPES) ×1 IMPLANT
COVER PROBE W GEL 5X96 (DRAPES) ×1 IMPLANT
DERMABOND ADVANCED .7 DNX12 (GAUZE/BANDAGES/DRESSINGS) ×2 IMPLANT
DRAPE LAPAROSCOPIC ABDOMINAL (DRAPES) ×1 IMPLANT
DRAPE UTILITY XL STRL (DRAPES) ×1 IMPLANT
ELECT REM PT RETURN 9FT ADLT (ELECTROSURGICAL) ×1
ELECTRODE REM PT RTRN 9FT ADLT (ELECTROSURGICAL) ×1 IMPLANT
GAUZE SPONGE 4X4 12PLY STRL LF (GAUZE/BANDAGES/DRESSINGS) IMPLANT
GLOVE SURG SIGNA 7.5 PF LTX (GLOVE) ×1 IMPLANT
GOWN STRL REUS W/ TWL LRG LVL3 (GOWN DISPOSABLE) ×2 IMPLANT
GOWN STRL REUS W/ TWL XL LVL3 (GOWN DISPOSABLE) ×2 IMPLANT
GOWN STRL REUS W/TWL LRG LVL3 (GOWN DISPOSABLE) ×1
GOWN STRL REUS W/TWL XL LVL3 (GOWN DISPOSABLE) ×1
KIT MARKER MARGIN INK (KITS) ×1 IMPLANT
NDL HYPO 25X1 1.5 SAFETY (NEEDLE) ×1 IMPLANT
NEEDLE HYPO 25X1 1.5 SAFETY (NEEDLE) ×1 IMPLANT
NS IRRIG 1000ML POUR BTL (IV SOLUTION) IMPLANT
PACK BASIN DAY SURGERY FS (CUSTOM PROCEDURE TRAY) ×1 IMPLANT
PENCIL SMOKE EVACUATOR (MISCELLANEOUS) ×1 IMPLANT
SLEEVE SCD COMPRESS KNEE MED (STOCKING) ×1 IMPLANT
SPIKE FLUID TRANSFER (MISCELLANEOUS) IMPLANT
SPONGE T-LAP 4X18 ~~LOC~~+RFID (SPONGE) ×2 IMPLANT
SUT MNCRL AB 4-0 PS2 18 (SUTURE) ×1 IMPLANT
SUT SILK 2 0 SH (SUTURE) IMPLANT
SUT VIC AB 3-0 SH 27 (SUTURE) ×1
SUT VIC AB 3-0 SH 27X BRD (SUTURE) ×1 IMPLANT
SYR CONTROL 10ML LL (SYRINGE) ×1 IMPLANT
TOWEL GREEN STERILE FF (TOWEL DISPOSABLE) ×1 IMPLANT
TRAY FAXITRON CT DISP (TRAY / TRAY PROCEDURE) ×2 IMPLANT
TUBE CONNECTING 20X1/4 (TUBING) IMPLANT
YANKAUER SUCT BULB TIP NO VENT (SUCTIONS) IMPLANT

## 2022-07-16 NOTE — Anesthesia Procedure Notes (Signed)
Procedure Name: LMA Insertion Date/Time: 07/16/2022 10:39 AM  Performed by: Verita Lamb, CRNAPre-anesthesia Checklist: Patient identified, Emergency Drugs available, Suction available and Patient being monitored Patient Re-evaluated:Patient Re-evaluated prior to induction Oxygen Delivery Method: Circle system utilized Preoxygenation: Pre-oxygenation with 100% oxygen Induction Type: IV induction Ventilation: Mask ventilation without difficulty LMA: LMA inserted LMA Size: 3.0 Number of attempts: 1 Airway Equipment and Method: Bite block Placement Confirmation: positive ETCO2, CO2 detector and breath sounds checked- equal and bilateral Tube secured with: Tape Dental Injury: Teeth and Oropharynx as per pre-operative assessment

## 2022-07-16 NOTE — Anesthesia Preprocedure Evaluation (Addendum)
Anesthesia Evaluation  Patient identified by MRN, date of birth, ID band Patient awake    Reviewed: Allergy & Precautions, NPO status , Patient's Chart, lab work & pertinent test results  Airway Mallampati: III  TM Distance: >3 FB Neck ROM: Full    Dental  (+) Partial Lower, Upper Dentures   Pulmonary former smoker,    Pulmonary exam normal        Cardiovascular hypertension, Pt. on medications and Pt. on home beta blockers Normal cardiovascular exam     Neuro/Psych Anxiety negative neurological ROS     GI/Hepatic Neg liver ROS, GERD  Medicated and Controlled,  Endo/Other  negative endocrine ROS  Renal/GU negative Renal ROS     Musculoskeletal  (+) Arthritis ,   Abdominal   Peds  Hematology negative hematology ROS (+)   Anesthesia Other Findings LEFT BREAST DCIS  Reproductive/Obstetrics                            Anesthesia Physical Anesthesia Plan  ASA: 2  Anesthesia Plan: General   Post-op Pain Management:    Induction: Intravenous  PONV Risk Score and Plan: 3 and Ondansetron, Dexamethasone and Treatment may vary due to age or medical condition  Airway Management Planned: LMA  Additional Equipment:   Intra-op Plan:   Post-operative Plan: Extubation in OR  Informed Consent: I have reviewed the patients History and Physical, chart, labs and discussed the procedure including the risks, benefits and alternatives for the proposed anesthesia with the patient or authorized representative who has indicated his/her understanding and acceptance.       Plan Discussed with: CRNA  Anesthesia Plan Comments:        Anesthesia Quick Evaluation

## 2022-07-16 NOTE — Anesthesia Postprocedure Evaluation (Signed)
Anesthesia Post Note  Patient: Alicia [name]  Procedure(s) Performed: LEFT BREAST LUMPECTOMY WITH RADIOACTIVE SEED LOCALIZATION (Left: Breast)     Patient location during evaluation: PACU Anesthesia Type: General Level of consciousness: awake Pain management: pain level controlled Vital Signs Assessment: post-procedure vital signs reviewed and stable Respiratory status: spontaneous breathing, nonlabored ventilation, respiratory function stable and patient connected to nasal cannula oxygen Cardiovascular status: blood pressure returned to baseline and stable Postop Assessment: no apparent nausea or vomiting Anesthetic complications: no   No notable events documented.  Last Vitals:  Vitals:   07/16/22 1145 07/16/22 1150  BP: (!) 101/49 127/64  Pulse: 68 68  Resp: 16 17  Temp:  36.4 C  SpO2: 96% 96%    Last Pain:  Vitals:   07/16/22 1150  TempSrc:   PainSc: 1                  Mckenleigh Tarlton P Leolia Vinzant

## 2022-07-16 NOTE — Discharge Instructions (Addendum)
[place] Office Phone Number [phone]  BREAST BIOPSY/ PARTIAL MASTECTOMY: POST OP INSTRUCTIONS  Always review your discharge instruction sheet given to you by the facility where your surgery was performed.  IF YOU HAVE DISABILITY OR FAMILY LEAVE FORMS, YOU MUST BRING THEM TO THE OFFICE FOR PROCESSING.  DO NOT GIVE THEM TO YOUR DOCTOR.  A prescription for pain medication may be given to you upon discharge.  Take your pain medication as prescribed, if needed.  If narcotic pain medicine is not needed, then you may take acetaminophen (Tylenol) or ibuprofen (Advil) as needed. Take your usually prescribed medications unless otherwise directed If you need a refill on your pain medication, please contact your pharmacy.  They will contact our office to request authorization.  Prescriptions will not be filled after 5pm or on week-ends. You should eat very light the first 24 hours after surgery, such as soup, crackers, pudding, etc.  Resume your normal diet the day after surgery. Most patients will experience some swelling and bruising in the breast.  Ice packs and a good support bra will help.  Swelling and bruising can take several days to resolve.  It is common to experience some constipation if taking pain medication after surgery.  Increasing fluid intake and taking a stool softener will usually help or prevent this problem from occurring.  A mild laxative (Milk of Magnesia or Miralax) should be taken according to package directions if there are no bowel movements after 48 hours. Unless discharge instructions indicate otherwise, you may remove your bandages 24-48 hours after surgery, and you may shower at that time.  You may have steri-strips (small skin tapes) in place directly over the incision.  These strips should be left on the skin for 7-10 days.  If your surgeon used skin glue on the incision, you may shower in 24 hours.  The glue will flake off over the next 2-3 weeks.  Any  sutures or staples will be removed at the office during your follow-up visit. ACTIVITIES:  You may resume regular daily activities (gradually increasing) beginning the next day.  Wearing a good support bra or sports bra minimizes pain and swelling.  You may have sexual intercourse when it is comfortable. You may drive when you no longer are taking prescription pain medication, you can comfortably wear a seatbelt, and you can safely maneuver your car and apply brakes. RETURN TO WORK:  ______________________________________________________________________________________ Dennis Bast should see your doctor in the office for a follow-up appointment approximately two weeks after your surgery.  Your doctor's nurse will typically make your follow-up appointment when she calls you with your pathology report.  Expect your pathology report 2-3 business days after your surgery.  You may call to check if you do not hear from [place] after three days. OTHER INSTRUCTIONS: YOU MAY REMOVE THE BINDER AND SHOWER STARTING TOMORROW ICE PACK, TYLENOL, AND IBUPROFEN ALSO FOR PAIN NO VIGOROUS ACTIVITY FOR 1 WEEK _______________________________________________________________________________________________ _____________________________________________________________________________________________________________________________________ _____________________________________________________________________________________________________________________________________ _____________________________________________________________________________________________________________________________________  WHEN TO CALL YOUR DOCTOR: Fever over 101.0 Nausea and/or vomiting. Extreme swelling or bruising. Continued bleeding from incision. Increased pain, redness, or drainage from the incision.  The clinic staff is available to answer your questions during regular business hours.  Please don't hesitate to call and ask to speak to one of the  nurses for clinical concerns.  If you have a medical emergency, go to the nearest emergency room or call 911.  A surgeon from [facility] is always on call at the hospital.  For  further questions, please visit [email or web address]     No Tylenol before 2:45pm if needed.  Post Anesthesia Home Care Instructions  Activity: Get plenty of rest for the remainder of the day. A responsible individual must stay with you for 24 hours following the procedure.  For the next 24 hours, DO NOT: -Drive a car -Paediatric nurse -Drink alcoholic beverages -Take any medication unless instructed by your physician -Make any legal decisions or sign important papers.  Meals: Start with liquid foods such as gelatin or soup. Progress to regular foods as tolerated. Avoid greasy, spicy, heavy foods. If nausea and/or vomiting occur, drink only clear liquids until the nausea and/or vomiting subsides. Call your physician if vomiting continues.  Special Instructions/Symptoms: Your throat may feel dry or sore from the anesthesia or the breathing tube placed in your throat during surgery. If this causes discomfort, gargle with warm salt water. The discomfort should disappear within 24 hours.

## 2022-07-16 NOTE — Interval H&P Note (Signed)
History and Physical Interval Note: no change in H and P  07/16/2022 8:06 AM  Alicia [name]  has presented today for surgery, with the diagnosis of LEFT BREAST DCIS.  The various methods of treatment have been discussed with the patient and family. After consideration of risks, benefits and other options for treatment, the patient has consented to  Procedure(s): LEFT BREAST LUMPECTOMY WITH RADIOACTIVE SEED LOCALIZATION (Left) as a surgical intervention.  The patient's history has been reviewed, patient examined, no change in status, stable for surgery.  I have reviewed the patient's chart and labs.  Questions were answered to the patient's satisfaction.     Coralie Keens

## 2022-07-16 NOTE — Op Note (Signed)
LEFT BREAST LUMPECTOMY WITH RADIOACTIVE SEED LOCALIZATION  Procedure Note  Alicia [name] 07/16/2022   Pre-op Diagnosis: LEFT BREAST DCIS     Post-op Diagnosis: same  Procedure(s): LEFT BREAST LUMPECTOMY WITH RADIOACTIVE SEED LOCALIZATION  Surgeon(s): Coralie Keens, MD  Anesthesia: General  Staff:  Circulator: Dolores Lory, RN; Faythe Dingwall, RN Scrub Person: Malachy Mood E, NT  Estimated Blood Loss: Minimal               Specimens: sent to path  Indications: This is a 81 year-old female who was found to have calcifications in left breast on screening mammography.  A biopsy showed ductal carcinoma in situ.  The decision was made to proceed with a radioactive seed guided left breast lumpectomy  Procedure: The patient was brought to the operating room and identifies the correct patient.  She was placed upon the operating table and general anesthesia was induced.  Her left breast was prepped and draped in the usual sterile fashion.  Using the neoprobe I located the radioactive seed in the upper outer quadrant at the 12:30 position of the left breast.  I anesthetized the skin around the areola with Marcaine and then made a circumareolar incision with a scalpel.  I then dissected down to the breast tissue and then laterally with the cautery.  I was able to dissect past the radioactive seed with a the neoprobe and then go down the chest wall.  I then dissected superiorly and inferiorly with the cautery and then medially staying widely around the radioactive seed.  I then completed a lumpectomy staying underneath the specimen with the cautery.  Once the lumpectomy specimen was removed, I marked all margins with paint.  An x-ray was performed confirming that the radioactive seed and previous biopsy clip were in the specimen.  The lumpectomy specimen was then sent to pathology for evaluation.  I achieved hemostasis with the cautery at the lumpectomy site.  I injected further Marcaine into  the incision.  I then closed the subcutaneous tissue with interrupted 3-0 Vicryl sutures and closed the skin with a running 4-0 Monocryl suture.  Dermabond was then applied.  The patient was then placed in a breast binder.  She was then extubated in the operating room and taken in stable condition to the recovery room.  All counts were correct at the end of the procedure.          Coralie Keens   Date: 07/16/2022  Time: 11:05 AM

## 2022-07-16 NOTE — Transfer of Care (Signed)
Immediate Anesthesia Transfer of Care Note  Patient: Alicia [name]  Procedure(s) Performed: LEFT BREAST LUMPECTOMY WITH RADIOACTIVE SEED LOCALIZATION (Left: Breast)  Patient Location: PACU  Anesthesia Type:General  Level of Consciousness: awake, alert  and oriented  Airway & Oxygen Therapy: Patient Spontanous Breathing and Patient connected to face mask oxygen  Post-op Assessment: Report given to RN and Post -op Vital signs reviewed and stable  Post vital signs: Reviewed and stable  Last Vitals:  Vitals Value Taken Time  BP 120/51 07/16/22 1107  Temp    Pulse 74 07/16/22 1108  Resp 13 07/16/22 1108  SpO2 99 % 07/16/22 1108  Vitals shown include unvalidated device data.  Last Pain:  Vitals:   07/16/22 0820  TempSrc: Oral  PainSc: 0-No pain         Complications: No notable events documented.

## 2022-07-17 ENCOUNTER — Ambulatory Visit: Payer: Self-pay | Admitting: Genetic Counselor

## 2022-07-17 ENCOUNTER — Encounter (HOSPITAL_BASED_OUTPATIENT_CLINIC_OR_DEPARTMENT_OTHER): Payer: Self-pay | Admitting: Surgery

## 2022-07-17 DIAGNOSIS — Z1379 Encounter for other screening for genetic and chromosomal anomalies: Secondary | ICD-10-CM

## 2022-07-17 NOTE — Progress Notes (Signed)
HPI:   Alicia [name] was previously seen in the [place] clinic due to a personal and family history of cancer and concerns regarding a hereditary predisposition to cancer. Please refer to our prior cancer genetics clinic note for more information regarding our discussion, assessment and recommendations, at the time. Alicia [name] recent genetic test results were disclosed to her, as were recommendations warranted by these results. These results and recommendations are discussed in more detail below.  CANCER HISTORY:  Oncology History  Ductal carcinoma in situ (DCIS) of left breast  06/19/2022 Initial Diagnosis   Screening detected distortion with calcifications in the left breast central 12 o'clock position measuring 1.9 cm.  No ultrasound correlate.  Axilla negative.  Stereotactic biopsy revealed intermediate grade DCIS cribriform and papillary features ER 100%, PR 60%   07/02/2022 Cancer Staging   Staging form: Breast, AJCC 8th Edition - Clinical: Stage 0 (cTis (DCIS), cN0, cM0, G2, ER+, PR+) - Signed by Nicholas Lose, MD on 07/02/2022 Stage prefix: Initial diagnosis Histologic grading system: 3 grade system    Genetic Testing   [facility] CustomNext Panel was Negative. Report date is 07/11/2022.  The CustomNext-Cancer+RNAinsight panel offered by [employer] includes sequencing and rearrangement analysis for the following 47 genes:  APC, ATM, AXIN2, BARD1, BMPR1A, BRCA1, BRCA2, BRIP1, CDH1, CDK4, CDKN2A, CHEK2, CTNNA1, DICER1, EPCAM, GREM1, HOXB13, KIT, MEN1, MLH1, MSH2, MSH3, MSH6, MUTYH, NBN, NF1, NTHL1, PALB2, PDGFRA, PMS2, POLD1, POLE, PTEN, RAD50, RAD51C, RAD51D, SDHA, SDHB, SDHC, SDHD, SMAD4, SMARCA4, STK11, TP53, TSC1, TSC2, and VHL.  RNA data is routinely analyzed for use in variant interpretation for all genes.     FAMILY HISTORY:  We obtained a detailed, 4-generation family history.  Significant diagnoses are listed below:   Alicia [name] maternal aunt was diagnosed with  breast cancer at age 72. She has limited information about her paternal family medical history. Alicia [name] is unaware of previous family history of genetic testing for hereditary cancer risks. There is no reported Ashkenazi Jewish ancestry.            GENETIC TEST RESULTS:  The [facility] CustomNext Panel found no pathogenic mutations.  The CustomNext-Cancer+RNAinsight panel offered by [employer] Forts includes sequencing and rearrangement analysis for the following 47 genes:  APC, ATM, AXIN2, BARD1, BMPR1A, BRCA1, BRCA2, BRIP1, CDH1, CDK4, CDKN2A, CHEK2, CTNNA1, DICER1, EPCAM, GREM1, HOXB13, KIT, MEN1, MLH1, MSH2, MSH3, MSH6, MUTYH, NBN, NF1, NTHL1, PALB2, PDGFRA, PMS2, POLD1, POLE, PTEN, RAD50, RAD51C, RAD51D, SDHA, SDHB, SDHC, SDHD, SMAD4, SMARCA4, STK11, TP53, TSC1, TSC2, and VHL.  RNA data is routinely analyzed for use in variant interpretation for all genes.  The test report has been scanned into EPIC and is located under the Molecular Pathology section of the Results Review tab.  A portion of the result report is included below for reference. Genetic testing reported out on 07/11/2022.        Even though a pathogenic variant was not identified, possible explanations for the cancer in the family may include: There may be no hereditary risk for cancer in the family. The cancers in Alicia [name] and/or her family may be due to other genetic or environmental factors. There may be a gene mutation in one of these genes that current testing methods cannot detect, but that chance is small. There could be another gene that has not yet been discovered, or that we have not yet tested, that is responsible for the cancer diagnoses in the family.   Therefore, it is important to remain  in touch with cancer genetics in the future so that we can continue to offer Alicia [name] the most up to date genetic testing.   ADDITIONAL GENETIC TESTING:  We discussed with Alicia [name] that her genetic testing was fairly  extensive.  If there are genes identified to increase cancer risk that can be analyzed in the future, we would be happy to discuss and coordinate this testing at that time.    CANCER SCREENING RECOMMENDATIONS:  Alicia [name] test result is considered negative (normal).  This means that we have not identified a hereditary cause for her personal and family history of cancer at this time. Most cancers happen by chance and this negative test suggests that her cancer may fall into this category.    An individual's cancer risk and medical management are not determined by genetic test results alone. Overall cancer risk assessment incorporates additional factors, including personal medical history, family history, and any available genetic information that may result in a personalized plan for cancer prevention and surveillance. Therefore, it is recommended she continue to follow the cancer management and screening guidelines provided by her oncology and primary healthcare provider.  RECOMMENDATIONS FOR FAMILY MEMBERS:   Since she did not inherit a mutation in a cancer predisposition gene included on this panel, her children could not have inherited a mutation from her in one of these genes. Individuals in this family might be at some increased risk of developing cancer, over the general population risk, due to the family history of cancer. We recommend women in this family have a yearly mammogram beginning at age 84, or 61 years younger than the earliest onset of cancer, an annual clinical breast exam, and perform monthly breast self-exams.  FOLLOW-UP:  Cancer genetics is a rapidly advancing field and it is possible that new genetic tests will be appropriate for her and/or her family members in the future. We encouraged her to remain in contact with cancer genetics on an annual basis so we can update her personal and family histories and let her know of advances in cancer genetics that may benefit this family.    Our contact number was provided. Alicia [name] questions were answered to her satisfaction, and she knows she is welcome to call us at anytime with additional questions or concerns.   Lucille Passy, MS, [facility] Genetic Counselor [email or web address] (P) [phone]

## 2022-07-21 ENCOUNTER — Other Ambulatory Visit: Payer: Self-pay | Admitting: Surgery

## 2022-07-21 ENCOUNTER — Encounter: Payer: Self-pay | Admitting: *Deleted

## 2022-07-21 LAB — SURGICAL PATHOLOGY

## 2022-07-22 ENCOUNTER — Encounter: Payer: Self-pay | Admitting: *Deleted

## 2022-07-24 ENCOUNTER — Telehealth: Payer: Self-pay | Admitting: Radiation Oncology

## 2022-07-24 NOTE — Progress Notes (Signed)
Patient Care Team: Gaynelle Arabian, MD as PCP - General (Family Medicine) Coralie Keens, MD as Consulting Physician (General Surgery) Nicholas Lose, MD as Consulting Physician (Hematology and Oncology) Gery Pray, MD as Consulting Physician (Radiation Oncology) Mauro Kaufmann, RN as Oncology Nurse Navigator Rockwell Germany, RN as Oncology Nurse Navigator  DIAGNOSIS:  Encounter Diagnosis  Name Primary?   Ductal carcinoma in situ (DCIS) of left breast     SUMMARY OF ONCOLOGIC HISTORY: Oncology History  Ductal carcinoma in situ (DCIS) of left breast  06/19/2022 Initial Diagnosis   Screening detected distortion with calcifications in the left breast central 12 o'clock position measuring 1.9 cm.  No ultrasound correlate.  Axilla negative.  Stereotactic biopsy revealed intermediate grade DCIS cribriform and papillary features ER 100%, PR 60%   07/02/2022 Cancer Staging   Staging form: Breast, AJCC 8th Edition - Clinical: Stage 0 (cTis (DCIS), cN0, cM0, G2, ER+, PR+) - Signed by Nicholas Lose, MD on 07/02/2022 Stage prefix: Initial diagnosis Histologic grading system: 3 grade system    Genetic Testing   [facility] CustomNext Panel was Negative. Report date is 07/11/2022.  The CustomNext-Cancer+RNAinsight panel offered by Althia Forts includes sequencing and rearrangement analysis for the following 47 genes:  APC, ATM, AXIN2, BARD1, BMPR1A, BRCA1, BRCA2, BRIP1, CDH1, CDK4, CDKN2A, CHEK2, CTNNA1, DICER1, EPCAM, GREM1, HOXB13, KIT, MEN1, MLH1, MSH2, MSH3, MSH6, MUTYH, NBN, NF1, NTHL1, PALB2, PDGFRA, PMS2, POLD1, POLE, PTEN, RAD50, RAD51C, RAD51D, SDHA, SDHB, SDHC, SDHD, SMAD4, SMARCA4, STK11, TP53, TSC1, TSC2, and VHL.  RNA data is routinely analyzed for use in variant interpretation for all genes.   07/16/2022 Surgery   Left lumpectomy: DCIS cribriform and papillary types with focal necrosis grade 3 involving a complex sclerosing lesion, margins focally positive with superior/posterior  margin ER 100%, PR 60%     CHIEF COMPLIANT: Follow-up after surgery  INTERVAL HISTORY: Alicia [name] is a 81 y.o with the above-mentioned left breast DCIS. She presents to the clinic for a follow-up. She reports that surgery went o.k. She has no new issues to report to the clinic today.    ALLERGIES:  is allergic to ibuprofen, lexapro [escitalopram oxalate], lisinopril, mobic [meloxicam], nabumetone, prilosec otc [omeprazole magnesium], zoloft [sertraline hcl], and sulfa antibiotics.  MEDICATIONS:  Current Outpatient Medications  Medication Sig Dispense Refill   acetaminophen (TYLENOL) 650 MG CR tablet Take 1,300 mg by mouth every 8 (eight) hours as needed for pain.     amLODipine (NORVASC) 5 MG tablet Take 2.5 mg by mouth daily.     Bioflavonoid Products (BIOFLEX PO) Take 1 tablet by mouth daily.     celecoxib (CELEBREX) 200 MG capsule Take 200 mg by mouth daily.     Flax Oil-Fish Oil-Borage Oil (FISH-FLAX-BORAGE PO) Take 1 capsule by mouth daily.     fluticasone (FLONASE) 50 MCG/ACT nasal spray Place 1 spray into both nostrils daily. (Patient taking differently: Place 1 spray into both nostrils as needed.) 16 g 0   irbesartan (AVAPRO) 300 MG tablet Take 300 mg by mouth daily.     isosorbide mononitrate (IMDUR) 30 MG 24 hr tablet Take 1/2 (one-half) tablet by mouth once daily 45 tablet 2   LORazepam (ATIVAN) 0.5 MG tablet Take 0.5 mg by mouth 2 (two) times daily as needed for anxiety.     meclizine (ANTIVERT) 25 MG tablet Take 25 mg by mouth 3 (three) times daily as needed for dizziness.     metoprolol succinate (TOPROL-XL) 50 MG 24 hr tablet Take 50 mg  by mouth daily.     Multiple Minerals-Vitamins (CAL MAG ZINC +D3 PO) Take 1 tablet by mouth daily.     multivitamin-lutein (OCUVITE-LUTEIN) CAPS Take 830 capsules by mouth daily.     omeprazole (PRILOSEC) 40 MG capsule Take 40 mg by mouth daily.     rOPINIRole (REQUIP) 1 MG tablet Take 1 tablet (1 mg total) by mouth 3 (three) times  daily. (Patient taking differently: Take 1-2 mg by mouth See admin instructions. Take 1 tablet (1 mg) by mouth in the morning & take 2 tablets (2 mg) by mouth at night.) 270 tablet 3   simvastatin (ZOCOR) 20 MG tablet Take 20 mg by mouth every evening.     spironolactone (ALDACTONE) 25 MG tablet Take 25 mg by mouth daily.     traMADol (ULTRAM) 50 MG tablet Take 1 tablet (50 mg total) by mouth every 6 (six) hours as needed. 20 tablet 0   traZODone (DESYREL) 50 MG tablet Take 50 mg by mouth at bedtime.      Current Facility-Administered Medications  Medication Dose Route Frequency Provider Last Rate Last Admin   sodium chloride flush (NS) 0.9 % injection 3 mL  3 mL Intravenous Q12H Lorretta Harp, MD        PHYSICAL EXAMINATION: ECOG PERFORMANCE STATUS: 1 - Symptomatic but completely ambulatory  Vitals:   07/28/22 1429  BP: 115/60  Pulse: (!) 58  Resp: 16  Temp: 97.7 F (36.5 C)  SpO2: 95%   Filed Weights   07/28/22 1429  Weight: 153 lb 12.8 oz (69.8 kg)      LABORATORY DATA:  I have reviewed the data as listed    Latest Ref Rng & Units 07/02/2022   12:05 PM 09/20/2020   10:35 AM 10/08/2018   10:10 PM  CMP  Glucose 70 - 99 mg/dL 127  104  114   BUN 8 - 23 mg/dL 39  35  22   Creatinine 0.44 - 1.00 mg/dL 1.40  1.01  0.72   Sodium 135 - 145 mmol/L 136  136  135   Potassium 3.5 - 5.1 mmol/L 4.4  4.2  3.4   Chloride 98 - 111 mmol/L 104  101  103   CO2 22 - 32 mmol/L _0 Calcium 8.9 - 10.3 mg/dL 10.3  10.1  9.1   Total Protein 6.5 - 8.1 g/dL 7.4   7.3   Total Bilirubin 0.3 - 1.2 mg/dL 0.6   0.6   Alkaline Phos 38 - 126 U/L 77   76   AST 15 - 41 U/L 15   20   ALT 0 - 44 U/L 10   15     Lab Results  Component Value Date   WBC 8.9 07/02/2022   HGB 13.3 07/02/2022   HCT 39.6 07/02/2022   MCV 90.2 07/02/2022   PLT 300 07/02/2022   NEUTROABS 5.7 07/02/2022    ASSESSMENT & PLAN:  Ductal carcinoma in situ (DCIS) of left breast 06/19/2022: Screening detected  distortion with calcifications in the left breast central 12 o'clock position measuring 1.9 cm.  No ultrasound correlate.  Axilla negative.  Stereotactic biopsy revealed intermediate grade DCIS cribriform and papillary features ER 100%, PR 60%  Recommendation: 1. Left lumpectomy: DCIS cribriform and papillary types with focal necrosis grade 3 involving a complex sclerosing lesion, margins focally positive with superior/posterior margin ER 100%, PR 60% 2. Followed by adjuvant radiation therapy 3. Followed by antiestrogen therapy with  tamoxifen 5 years  Because of positive margins will need to discuss reexcision (with this has been set up for 08/13/2022) Return to clinic after radiation is complete that   No orders of the defined types were placed in this encounter.  The patient has a good understanding of the overall plan. she agrees with it. she will call with any problems that may develop before the next visit here. Total time spent: 30 mins including face to face time and time spent for planning, charting and co-ordination of care   Harriette Ohara, MD 07/28/22    I Gardiner Coins am scribing for Dr. Lindi Adie  I have reviewed the above documentation for accuracy and completeness, and I agree with the above.

## 2022-07-24 NOTE — Telephone Encounter (Signed)
Called patient to reschedule consult w. Dr. Sondra Come. No answer, LVM for a return call.

## 2022-07-28 ENCOUNTER — Inpatient Hospital Stay (HOSPITAL_BASED_OUTPATIENT_CLINIC_OR_DEPARTMENT_OTHER): Payer: Medicare HMO | Admitting: Hematology and Oncology

## 2022-07-28 DIAGNOSIS — D0512 Intraductal carcinoma in situ of left breast: Secondary | ICD-10-CM

## 2022-07-28 DIAGNOSIS — Z79899 Other long term (current) drug therapy: Secondary | ICD-10-CM | POA: Diagnosis not present

## 2022-07-28 NOTE — Assessment & Plan Note (Addendum)
06/19/2022: Screening detected distortion with calcifications in the left breast central 12 o'clock position measuring 1.9 cm.  No ultrasound correlate.  Axilla negative.  Stereotactic biopsy revealed intermediate grade DCIS cribriform and papillary features ER 100%, PR 60%  Recommendation: 1. Left lumpectomy: DCIS cribriform and papillary types with focal necrosis grade 3 involving a complex sclerosing lesion, margins focally positive with superior/posterior margin ER 100%, PR 60% 2. Followed by adjuvant radiation therapy 3. Followed by antiestrogen therapy with tamoxifen 5 years  Because of positive margins will need to discuss reexcision (with this has been set up for 08/13/2022)

## 2022-07-30 ENCOUNTER — Encounter: Payer: Self-pay | Admitting: *Deleted

## 2022-08-04 ENCOUNTER — Other Ambulatory Visit: Payer: Self-pay

## 2022-08-04 ENCOUNTER — Encounter (HOSPITAL_BASED_OUTPATIENT_CLINIC_OR_DEPARTMENT_OTHER): Payer: Self-pay | Admitting: Surgery

## 2022-08-04 ENCOUNTER — Encounter: Payer: Self-pay | Admitting: Genetic Counselor

## 2022-08-11 ENCOUNTER — Encounter (HOSPITAL_BASED_OUTPATIENT_CLINIC_OR_DEPARTMENT_OTHER)
Admission: RE | Admit: 2022-08-11 | Discharge: 2022-08-11 | Disposition: A | Discharge status: Home / Self Care | Payer: Medicare HMO | Source: Ambulatory Visit | Attending: Surgery | Admitting: Surgery

## 2022-08-11 DIAGNOSIS — N6082 Other benign mammary dysplasias of left breast: Secondary | ICD-10-CM | POA: Diagnosis not present

## 2022-08-11 DIAGNOSIS — D0512 Intraductal carcinoma in situ of left breast: Secondary | ICD-10-CM | POA: Diagnosis present

## 2022-08-11 DIAGNOSIS — K219 Gastro-esophageal reflux disease without esophagitis: Secondary | ICD-10-CM | POA: Diagnosis not present

## 2022-08-11 DIAGNOSIS — Z87891 Personal history of nicotine dependence: Secondary | ICD-10-CM | POA: Diagnosis not present

## 2022-08-11 DIAGNOSIS — I10 Essential (primary) hypertension: Secondary | ICD-10-CM | POA: Diagnosis not present

## 2022-08-11 LAB — BASIC METABOLIC PANEL
Anion gap: 10 (ref 5–15)
BUN: 41 mg/dL — ABNORMAL HIGH (ref 8–23)
CO2: 25 mmol/L (ref 22–32)
Calcium: 10.1 mg/dL (ref 8.9–10.3)
Chloride: 101 mmol/L (ref 98–111)
Creatinine, Ser: 1.42 mg/dL — ABNORMAL HIGH (ref 0.44–1.00)
GFR, Estimated: 38 mL/min — ABNORMAL LOW (ref >60)
Glucose, Bld: 100 mg/dL — ABNORMAL HIGH (ref 70–99)
Potassium: 5.6 mmol/L — ABNORMAL HIGH (ref 3.5–5.1)
Sodium: 136 mmol/L (ref 135–145)

## 2022-08-11 MED ORDER — CHLORHEXIDINE GLUCONATE CLOTH 2 % EX PADS: 6.0000 | MEDICATED_PAD | Freq: Once | CUTANEOUS | Status: DC

## 2022-08-11 MED ORDER — ENSURE PRE-SURGERY PO LIQD: 296.0000 mL | Freq: Once | ORAL | Status: DC

## 2022-08-11 NOTE — Progress Notes (Signed)

## 2022-08-12 NOTE — H&P (Signed)
Alicia [name] is an 81 y.o. female.   Chief Complaint: positive margins HPI: This is a 81 yo female who underwent a left breast radioactive seed guided lumpectomy for DCIS.  There were positive and close margins so re-excision is recommended.  She is otherwise without complaints and is recovering well from surgery  Past Medical History:  Diagnosis Date   Anginal pain ([place])    Anxiety 2014   Bursitis    Cancer ([place]) 06/2022   left breast DCIS   Dyspnea    with exertion   GERD (gastroesophageal reflux disease) 2008   Hypercholesteremia 2003   Hypertension 2003   Insomnia 1988   Kidney stone    Osteoarthritis 2008   hands, knees, right hip   Osteopenia 2002   Restless leg syndrome 2000   RLS (restless legs syndrome) 03/28/2016   Vertigo 2002    Past Surgical History:  Procedure Laterality Date   ABDOMINAL HYSTERECTOMY     APPENDECTOMY     BLEPHAROPLASTY  2019   BREAST LUMPECTOMY WITH RADIOACTIVE SEED LOCALIZATION Left 07/16/2022   Procedure: LEFT BREAST LUMPECTOMY WITH RADIOACTIVE SEED LOCALIZATION;  Surgeon: Coralie Keens, MD;  Location: [facility];  Service: General;  Laterality: Left;   CATARACT EXTRACTION Right 02/2010   CHOLECYSTECTOMY     COLONOSCOPY  2015   results normal   LEFT HEART CATH AND CORONARY ANGIOGRAPHY N/A 09/20/2020   Procedure: LEFT HEART CATH AND CORONARY ANGIOGRAPHY;  Surgeon: Lorretta Harp, MD;  Location: [facility];  Service: Cardiovascular;  Laterality: N/A;   ROTATOR CUFF REPAIR Right     Family History  Problem Relation Age of Onset   Heart attack Mother    Depression Mother    Stroke Mother    Diabetes Mother    Heart attack Father    COPD Sister    COPD Sister    COPD Brother    COPD Brother    Restless legs syndrome Daughter    Restless legs syndrome Son    Restless legs syndrome Son    Breast cancer Maternal Aunt 60   Social History:  reports that she quit smoking about 25 years ago. Her smoking use  included cigarettes. She has a 10.00 pack-year smoking history. She has never used smokeless tobacco. She reports that she does not drink alcohol and does not use drugs.  Allergies:  Allergies  Allergen Reactions   Ibuprofen Other (See Comments)    GI UPSET   Lexapro [Escitalopram Oxalate] Other (See Comments)    GERD   Lisinopril Cough   Mobic [Meloxicam] Other (See Comments)    LEG PAIN   Nabumetone Other (See Comments)    GI UPSET   Prilosec Otc [Omeprazole Magnesium] Other (See Comments)    ARM SPASM   Zoloft [Sertraline Hcl] Other (See Comments)    INSOMNIA   Sulfa Antibiotics Rash    No medications prior to admission.    Results for orders placed or performed during the hospital encounter of 08/13/22 (from the past 48 hour(s))  Basic metabolic panel per protocol     Status: Abnormal   Collection Time: 08/11/22  3:14 PM  Result Value Ref Range   Sodium 136 135 - 145 mmol/L   Potassium 5.6 (H) 3.5 - 5.1 mmol/L   Chloride 101 98 - 111 mmol/L   CO2 25 22 - 32 mmol/L   Glucose, Bld 100 (H) 70 - 99 mg/dL    Comment: Glucose reference range applies  only to samples taken after fasting for at least 8 hours.   BUN 41 (H) 8 - 23 mg/dL   Creatinine, Ser 1.42 (H) 0.44 - 1.00 mg/dL   Calcium 10.1 8.9 - 10.3 mg/dL   GFR, Estimated 38 (L) >60 mL/min    Comment: (NOTE) Calculated using the CKD-EPI Creatinine Equation (2021)    Anion gap 10 5 - 15    Comment: Performed at [facility] [address], [place], [place] [address]   No results found.  Review of Systems  All other systems reviewed and are negative.   Height '5\' 1"'$  (1.549 m), weight 69.4 kg. Physical Exam Constitutional:      General: She is not in acute distress.    Appearance: Normal appearance.  HENT:     Head: Normocephalic and atraumatic.  Eyes:     Pupils: Pupils are equal, round, and reactive to light.  Cardiovascular:     Rate and Rhythm: Normal rate and regular rhythm.  Pulmonary:      Effort: Pulmonary effort is normal.     Breath sounds: Normal breath sounds.  Abdominal:     General: Abdomen is flat.  Musculoskeletal:     Cervical back: Neck supple.  Skin:    General: Skin is warm and dry.  Neurological:     General: No focal deficit present.     Mental Status: She is alert.  Psychiatric:        Behavior: Behavior normal.      Assessment/Plan S/p Left breast lumpectomy for DCIS with positive and close margins.  I discussed the pathology with the patient. Re-excision is recommended for treatment of the DCIS of the Left Breast.  We discussed this in detail.  We discussed the risks in detail.  We discussed the risks of still having further margins positive post op as well. She agrees to proceed.  Coralie Keens, MD 08/12/2022, 7:00 PM

## 2022-08-12 NOTE — Progress Notes (Signed)
Dr. Christella Hartigan reviewed pt's recent lab results, BUN and creatinine levels.  Ok to proceed with surgery for tomorrow at [facility], 08/13/22.

## 2022-08-12 NOTE — Progress Notes (Signed)
K+ 5.6, Dr. Christella Hartigan aware, will recheck BMET day of surgery.

## 2022-08-13 ENCOUNTER — Other Ambulatory Visit: Payer: Self-pay

## 2022-08-13 ENCOUNTER — Ambulatory Visit (HOSPITAL_BASED_OUTPATIENT_CLINIC_OR_DEPARTMENT_OTHER)
Admission: RE | Admit: 2022-08-13 | Discharge: 2022-08-13 | Disposition: A | Discharge status: Home / Self Care | Payer: Medicare HMO | Attending: Surgery | Admitting: Surgery

## 2022-08-13 ENCOUNTER — Ambulatory Visit (HOSPITAL_BASED_OUTPATIENT_CLINIC_OR_DEPARTMENT_OTHER): Payer: Medicare HMO | Admitting: Anesthesiology

## 2022-08-13 ENCOUNTER — Encounter (HOSPITAL_BASED_OUTPATIENT_CLINIC_OR_DEPARTMENT_OTHER)
Admission: RE | Disposition: A | Discharge status: Home / Self Care | Payer: Self-pay | Source: Home / Self Care | Attending: Surgery

## 2022-08-13 ENCOUNTER — Encounter (HOSPITAL_BASED_OUTPATIENT_CLINIC_OR_DEPARTMENT_OTHER): Payer: Self-pay | Admitting: Surgery

## 2022-08-13 DIAGNOSIS — N62 Hypertrophy of breast: Secondary | ICD-10-CM | POA: Diagnosis not present

## 2022-08-13 DIAGNOSIS — Z87891 Personal history of nicotine dependence: Secondary | ICD-10-CM | POA: Diagnosis not present

## 2022-08-13 DIAGNOSIS — D0512 Intraductal carcinoma in situ of left breast: Secondary | ICD-10-CM | POA: Diagnosis not present

## 2022-08-13 DIAGNOSIS — N6082 Other benign mammary dysplasias of left breast: Secondary | ICD-10-CM | POA: Diagnosis not present

## 2022-08-13 DIAGNOSIS — Z01818 Encounter for other preprocedural examination: Secondary | ICD-10-CM

## 2022-08-13 DIAGNOSIS — I10 Essential (primary) hypertension: Secondary | ICD-10-CM | POA: Insufficient documentation

## 2022-08-13 DIAGNOSIS — C50912 Malignant neoplasm of unspecified site of left female breast: Secondary | ICD-10-CM

## 2022-08-13 DIAGNOSIS — N6012 Diffuse cystic mastopathy of left breast: Secondary | ICD-10-CM | POA: Diagnosis not present

## 2022-08-13 DIAGNOSIS — K219 Gastro-esophageal reflux disease without esophagitis: Secondary | ICD-10-CM | POA: Insufficient documentation

## 2022-08-13 DIAGNOSIS — Z170 Estrogen receptor positive status [ER+]: Secondary | ICD-10-CM | POA: Diagnosis not present

## 2022-08-13 HISTORY — PX: RE-EXCISION OF BREAST LUMPECTOMY: SHX6048

## 2022-08-13 LAB — BASIC METABOLIC PANEL
Anion gap: 9 (ref 5–15)
BUN: 37 mg/dL — ABNORMAL HIGH (ref 8–23)
CO2: 22 mmol/L (ref 22–32)
Calcium: 9.9 mg/dL (ref 8.9–10.3)
Chloride: 105 mmol/L (ref 98–111)
Creatinine, Ser: 1.26 mg/dL — ABNORMAL HIGH (ref 0.44–1.00)
GFR, Estimated: 43 mL/min — ABNORMAL LOW (ref >60)
Glucose, Bld: 126 mg/dL — ABNORMAL HIGH (ref 70–99)
Potassium: 3.9 mmol/L (ref 3.5–5.1)
Sodium: 136 mmol/L (ref 135–145)

## 2022-08-13 SURGERY — EXCISION, LESION, BREAST
Anesthesia: General | Site: Breast | Laterality: Left

## 2022-08-13 MED ORDER — PROPOFOL 10 MG/ML IV BOLUS
INTRAVENOUS | Status: DC | PRN
  Administered 2022-08-13: 130 mg via INTRAVENOUS

## 2022-08-13 MED ORDER — TRAMADOL HCL 50 MG PO TABS: 50.0000 mg | ORAL_TABLET | Freq: Four times a day (QID) | ORAL | 0 refills | Status: DC | PRN

## 2022-08-13 MED ORDER — ONDANSETRON HCL 4 MG/2ML IJ SOLN: 4.0000 mg | Freq: Once | INTRAMUSCULAR | Status: DC | PRN

## 2022-08-13 MED ORDER — DEXAMETHASONE SODIUM PHOSPHATE 10 MG/ML IJ SOLN
INTRAMUSCULAR | Status: AC
  Filled 2022-08-13: qty 1

## 2022-08-13 MED ORDER — CEFAZOLIN SODIUM-DEXTROSE 2-4 GM/100ML-% IV SOLN
INTRAVENOUS | Status: AC
  Filled 2022-08-13: qty 100

## 2022-08-13 MED ORDER — LACTATED RINGERS IV SOLN: INTRAVENOUS | Status: DC | PRN

## 2022-08-13 MED ORDER — PROPOFOL 10 MG/ML IV BOLUS
INTRAVENOUS | Status: AC
  Filled 2022-08-13: qty 20

## 2022-08-13 MED ORDER — ONDANSETRON HCL 4 MG/2ML IJ SOLN
INTRAMUSCULAR | Status: DC | PRN
  Administered 2022-08-13: 4 mg via INTRAVENOUS

## 2022-08-13 MED ORDER — CEFAZOLIN SODIUM-DEXTROSE 2-3 GM-%(50ML) IV SOLR
INTRAVENOUS | Status: DC | PRN
  Administered 2022-08-13: 2 g via INTRAVENOUS

## 2022-08-13 MED ORDER — FENTANYL CITRATE (PF) 100 MCG/2ML IJ SOLN
INTRAMUSCULAR | Status: AC
  Filled 2022-08-13: qty 2

## 2022-08-13 MED ORDER — DEXAMETHASONE SODIUM PHOSPHATE 10 MG/ML IJ SOLN
INTRAMUSCULAR | Status: DC | PRN
  Administered 2022-08-13: 10 mg via INTRAVENOUS

## 2022-08-13 MED ORDER — EPHEDRINE SULFATE (PRESSORS) 50 MG/ML IJ SOLN
INTRAMUSCULAR | Status: DC | PRN
  Administered 2022-08-13: 5 mg via INTRAVENOUS
  Administered 2022-08-13: 15 mg via INTRAVENOUS
  Administered 2022-08-13: 5 mg via INTRAVENOUS
  Administered 2022-08-13: 10 mg via INTRAVENOUS

## 2022-08-13 MED ORDER — ACETAMINOPHEN 500 MG PO TABS
ORAL_TABLET | ORAL | Status: AC
  Filled 2022-08-13: qty 2

## 2022-08-13 MED ORDER — LIDOCAINE 2% (20 MG/ML) 5 ML SYRINGE
INTRAMUSCULAR | Status: AC
  Filled 2022-08-13: qty 5

## 2022-08-13 MED ORDER — KETOROLAC TROMETHAMINE 30 MG/ML IJ SOLN
INTRAMUSCULAR | Status: AC
  Filled 2022-08-13: qty 1

## 2022-08-13 MED ORDER — ONDANSETRON HCL 4 MG/2ML IJ SOLN
INTRAMUSCULAR | Status: AC
  Filled 2022-08-13: qty 2

## 2022-08-13 MED ORDER — FENTANYL CITRATE (PF) 100 MCG/2ML IJ SOLN
INTRAMUSCULAR | Status: DC | PRN
  Administered 2022-08-13: 25 ug via INTRAVENOUS

## 2022-08-13 MED ORDER — KETOROLAC TROMETHAMINE 15 MG/ML IJ SOLN
15.0000 mg | Freq: Once | INTRAMUSCULAR | Status: AC
  Administered 2022-08-13: 15 mg via INTRAVENOUS

## 2022-08-13 MED ORDER — CEFAZOLIN SODIUM-DEXTROSE 2-4 GM/100ML-% IV SOLN: 2.0000 g | INTRAVENOUS | Status: DC

## 2022-08-13 MED ORDER — LACTATED RINGERS IV SOLN: INTRAVENOUS | Status: DC

## 2022-08-13 MED ORDER — LIDOCAINE HCL (CARDIAC) PF 100 MG/5ML IV SOSY
PREFILLED_SYRINGE | INTRAVENOUS | Status: DC | PRN
  Administered 2022-08-13: 60 mg via INTRAVENOUS

## 2022-08-13 MED ORDER — FENTANYL CITRATE (PF) 100 MCG/2ML IJ SOLN
25.0000 ug | INTRAMUSCULAR | Status: DC | PRN
  Administered 2022-08-13 (×2): 25 ug via INTRAVENOUS

## 2022-08-13 MED ORDER — BUPIVACAINE HCL 0.25 % IJ SOLN
INTRAMUSCULAR | Status: DC | PRN
  Administered 2022-08-13: 10 mL

## 2022-08-13 MED ORDER — ACETAMINOPHEN 500 MG PO TABS
1000.0000 mg | ORAL_TABLET | ORAL | Status: AC
  Administered 2022-08-13: 1000 mg via ORAL

## 2022-08-13 MED ORDER — PHENYLEPHRINE HCL (PRESSORS) 10 MG/ML IV SOLN
INTRAVENOUS | Status: DC | PRN
  Administered 2022-08-13 (×4): 80 ug via INTRAVENOUS

## 2022-08-13 MED ORDER — BUPIVACAINE-EPINEPHRINE (PF) 0.5% -1:200000 IJ SOLN
INTRAMUSCULAR | Status: AC
  Filled 2022-08-13: qty 30

## 2022-08-13 SURGICAL SUPPLY — 41 items
ADH SKN CLS APL DERMABOND .7 (GAUZE/BANDAGES/DRESSINGS) ×1
APL PRP STRL LF DISP 70% ISPRP (MISCELLANEOUS) ×1
BLADE SURG 15 STRL LF DISP TIS (BLADE) ×1 IMPLANT
BLADE SURG 15 STRL SS (BLADE) ×1
CANISTER SUCT 1200ML W/VALVE (MISCELLANEOUS) ×1 IMPLANT
CHLORAPREP W/TINT 26 (MISCELLANEOUS) ×2 IMPLANT
CLIP TI WIDE RED SMALL 6 (CLIP) IMPLANT
COVER BACK TABLE 60X90IN (DRAPES) ×1 IMPLANT
COVER MAYO STAND STRL (DRAPES) ×1 IMPLANT
DERMABOND ADVANCED .7 DNX12 (GAUZE/BANDAGES/DRESSINGS) ×1 IMPLANT
DRAPE LAPAROTOMY 100X72 PEDS (DRAPES) ×2 IMPLANT
DRAPE UTILITY XL STRL (DRAPES) ×1 IMPLANT
ELECT REM PT RETURN 9FT ADLT (ELECTROSURGICAL) ×1
ELECTRODE REM PT RTRN 9FT ADLT (ELECTROSURGICAL) ×1 IMPLANT
GAUZE SPONGE 4X4 12PLY STRL (GAUZE/BANDAGES/DRESSINGS) ×2 IMPLANT
GLOVE BIO SURGEON STRL SZ 6.5 (GLOVE) IMPLANT
GLOVE BIO SURGEON STRL SZ7 (GLOVE) ×2 IMPLANT
GLOVE BIOGEL PI IND STRL 8 (GLOVE) IMPLANT
GLOVE SURG SIGNA 7.5 PF LTX (GLOVE) ×1 IMPLANT
GOWN STRL REUS W/ TWL LRG LVL3 (GOWN DISPOSABLE) ×2 IMPLANT
GOWN STRL REUS W/ TWL XL LVL3 (GOWN DISPOSABLE) ×2 IMPLANT
GOWN STRL REUS W/TWL LRG LVL3 (GOWN DISPOSABLE) ×1
GOWN STRL REUS W/TWL XL LVL3 (GOWN DISPOSABLE) ×1
KIT MARKER MARGIN INK (KITS) IMPLANT
NDL HYPO 25X1 1.5 SAFETY (NEEDLE) ×2 IMPLANT
NEEDLE HYPO 25X1 1.5 SAFETY (NEEDLE) ×1 IMPLANT
NS IRRIG 1000ML POUR BTL (IV SOLUTION) IMPLANT
PACK BASIN DAY SURGERY FS (CUSTOM PROCEDURE TRAY) ×1 IMPLANT
PENCIL SMOKE EVACUATOR (MISCELLANEOUS) ×2 IMPLANT
SLEEVE SCD COMPRESS KNEE MED (STOCKING) ×2 IMPLANT
SPIKE FLUID TRANSFER (MISCELLANEOUS) IMPLANT
SPONGE T-LAP 4X18 ~~LOC~~+RFID (SPONGE) ×2 IMPLANT
SUT MNCRL AB 4-0 PS2 18 (SUTURE) ×1 IMPLANT
SUT SILK 2 0 SH (SUTURE) ×1 IMPLANT
SUT VIC AB 3-0 SH 27 (SUTURE) ×1
SUT VIC AB 3-0 SH 27X BRD (SUTURE) ×1 IMPLANT
SYR CONTROL 10ML LL (SYRINGE) ×1 IMPLANT
TOWEL GREEN STERILE FF (TOWEL DISPOSABLE) ×2 IMPLANT
TRAY FAXITRON CT DISP (TRAY / TRAY PROCEDURE) IMPLANT
TUBE CONNECTING 20X1/4 (TUBING) IMPLANT
YANKAUER SUCT BULB TIP NO VENT (SUCTIONS) ×1 IMPLANT

## 2022-08-13 NOTE — Anesthesia Postprocedure Evaluation (Signed)
Anesthesia Post Note  Patient: Alicia [name]  Procedure(s) Performed: RE-EXCISION OF LEFT BREAST CANCER (Left: Breast)     Patient location during evaluation: PACU Anesthesia Type: General Level of consciousness: awake and alert Pain management: pain level controlled Vital Signs Assessment: post-procedure vital signs reviewed and stable Respiratory status: spontaneous breathing, nonlabored ventilation, respiratory function stable and patient connected to nasal cannula oxygen Cardiovascular status: blood pressure returned to baseline and stable Postop Assessment: no apparent nausea or vomiting Anesthetic complications: no   No notable events documented.  Last Vitals:  Vitals:   08/13/22 1130 08/13/22 1145  BP: (!) 99/53 (!) 103/55  Pulse: 79 82  Resp: 14 16  Temp:    SpO2: 94% 94%    Last Pain:  Vitals:   08/13/22 1145  TempSrc:   PainSc: [place] Elajah Kunsman

## 2022-08-13 NOTE — Interval H&P Note (Signed)
History and Physical Interval Note:no change in H and P  08/13/2022 9:26 AM  Alicia [name]  has presented today for surgery, with the diagnosis of LEFT BREAST CANCER POSITIVE MARGIN.  The various methods of treatment have been discussed with the patient and family. After consideration of risks, benefits and other options for treatment, the patient has consented to  Procedure(s): RE-EXCISION OF LEFT BREAST CANCER (Left) as a surgical intervention.  The patient's history has been reviewed, patient examined, no change in status, stable for surgery.  I have reviewed the patient's chart and labs.  Questions were answered to the patient's satisfaction.     Coralie Keens

## 2022-08-13 NOTE — Anesthesia Preprocedure Evaluation (Addendum)
Anesthesia Evaluation  Patient identified by MRN, date of birth, ID band Patient awake    Reviewed: Allergy & Precautions, NPO status , Patient's Chart, lab work & pertinent test results, reviewed documented beta blocker date and time   Airway Mallampati: III  TM Distance: <3 FB Neck ROM: Full    Dental  (+) Dental Advisory Given, Partial Lower, Upper Dentures   Pulmonary former smoker   Pulmonary exam normal breath sounds clear to auscultation       Cardiovascular hypertension, Pt. on medications and Pt. on home beta blockers Normal cardiovascular exam Rhythm:Regular Rate:Normal     Neuro/Psych  PSYCHIATRIC DISORDERS Anxiety     negative neurological ROS     GI/Hepatic Neg liver ROS,GERD  Medicated,,  Endo/Other  negative endocrine ROS    Renal/GU Renal InsufficiencyRenal disease     Musculoskeletal  (+) Arthritis ,    Abdominal   Peds  Hematology negative hematology ROS (+)   Anesthesia Other Findings Day of surgery medications reviewed with the patient.  LEFT BREAST CANCER POSITIVE MARGIN  Reproductive/Obstetrics                             Anesthesia Physical Anesthesia Plan  ASA: 2  Anesthesia Plan: General   Post-op Pain Management: Tylenol PO (pre-op)*   Induction: Intravenous  PONV Risk Score and Plan: 3 and Dexamethasone, Ondansetron, Treatment may vary due to age or medical condition and TIVA  Airway Management Planned: LMA  Additional Equipment:   Intra-op Plan:   Post-operative Plan: Extubation in OR  Informed Consent: I have reviewed the patients History and Physical, chart, labs and discussed the procedure including the risks, benefits and alternatives for the proposed anesthesia with the patient or authorized representative who has indicated his/her understanding and acceptance.     Dental advisory given  Plan Discussed with: CRNA  Anesthesia Plan  Comments:         Anesthesia Quick Evaluation

## 2022-08-13 NOTE — Discharge Instructions (Addendum)
[place] Office Phone Number [phone]  BREAST BIOPSY/ PARTIAL MASTECTOMY: POST OP INSTRUCTIONS  Always review your discharge instruction sheet given to you by the facility where your surgery was performed.  IF YOU HAVE DISABILITY OR FAMILY LEAVE FORMS, YOU MUST BRING THEM TO THE OFFICE FOR PROCESSING.  DO NOT GIVE THEM TO YOUR DOCTOR.  A prescription for pain medication may be given to you upon discharge.  Take your pain medication as prescribed, if needed.  If narcotic pain medicine is not needed, then you may take acetaminophen (Tylenol) or ibuprofen (Advil) as needed. Take your usually prescribed medications unless otherwise directed If you need a refill on your pain medication, please contact your pharmacy.  They will contact our office to request authorization.  Prescriptions will not be filled after 5pm or on week-ends. You should eat very light the first 24 hours after surgery, such as soup, crackers, pudding, etc.  Resume your normal diet the day after surgery. Most patients will experience some swelling and bruising in the breast.  Ice packs and a good support bra will help.  Swelling and bruising can take several days to resolve.  It is common to experience some constipation if taking pain medication after surgery.  Increasing fluid intake and taking a stool softener will usually help or prevent this problem from occurring.  A mild laxative (Milk of Magnesia or Miralax) should be taken according to package directions if there are no bowel movements after 48 hours. Unless discharge instructions indicate otherwise, you may remove your bandages 24-48 hours after surgery, and you may shower at that time.  You may have steri-strips (small skin tapes) in place directly over the incision.  These strips should be left on the skin for 7-10 days.  If your surgeon used skin glue on the incision, you may shower in 24 hours.  The glue will flake off over the next 2-3 weeks.  Any  sutures or staples will be removed at the office during your follow-up visit. ACTIVITIES:  You may resume regular daily activities (gradually increasing) beginning the next day.  Wearing a good support bra or sports bra minimizes pain and swelling.  You may have sexual intercourse when it is comfortable. You may drive when you no longer are taking prescription pain medication, you can comfortably wear a seatbelt, and you can safely maneuver your car and apply brakes. RETURN TO WORK:  ______________________________________________________________________________________ Dennis Bast should see your doctor in the office for a follow-up appointment approximately two weeks after your surgery.  Your doctor's nurse will typically make your follow-up appointment when she calls you with your pathology report.  Expect your pathology report 2-3 business days after your surgery.  You may call to check if you do not hear from [place] after three days. OTHER INSTRUCTIONS: _______________________________________________________________________________________________ _____________________________________________________________________________________________________________________________________ _____________________________________________________________________________________________________________________________________ _____________________________________________________________________________________________________________________________________  WHEN TO CALL YOUR DOCTOR: Fever over 101.0 Nausea and/or vomiting. Extreme swelling or bruising. Continued bleeding from incision. Increased pain, redness, or drainage from the incision.  The clinic staff is available to answer your questions during regular business hours.  Please don't hesitate to call and ask to speak to one of the nurses for clinical concerns.  If you have a medical emergency, go to the nearest emergency room or call 911.  A surgeon from [facility] is always on call at the hospital.  For further questions, please visit [email or web address]    No Tylenol before 2:15pm if needed.  Post Anesthesia Home Care Instructions  Activity: Get  plenty of rest for the remainder of the day. A responsible individual must stay with you for 24 hours following the procedure.  For the next 24 hours, DO NOT: -Drive a car -Paediatric nurse -Drink alcoholic beverages -Take any medication unless instructed by your physician -Make any legal decisions or sign important papers.  Meals: Start with liquid foods such as gelatin or soup. Progress to regular foods as tolerated. Avoid greasy, spicy, heavy foods. If nausea and/or vomiting occur, drink only clear liquids until the nausea and/or vomiting subsides. Call your physician if vomiting continues.  Special Instructions/Symptoms: Your throat may feel dry or sore from the anesthesia or the breathing tube placed in your throat during surgery. If this causes discomfort, gargle with warm salt water. The discomfort should disappear within 24 hours.

## 2022-08-13 NOTE — Op Note (Signed)
RE-EXCISION OF LEFT BREAST CANCER  Procedure Note  Alicia [name] 08/13/2022   Pre-op Diagnosis: LEFT BREAST CANCER POSITIVE MARGIN     Post-op Diagnosis: same  Procedure(s): RE-EXCISION OF LEFT BREAST CANCER  Surgeon(s): Alicia Keens, MD Maczis, Carlena Hurl, PA-C  Anesthesia: General  Staff:  Circulator: Merwyn Katos, RN Scrub Person: Lorenza Burton, CST  Estimated Blood Loss: Minimal               Specimens: sent to path  Indications: This is a 81 year old female who had undergone a left breast radioactive seed guided lumpectomy for DCIS.  The final pathology showed a focally positive superior/posterior margin.  The anterior and inferior margins were negative but close therefore decision made.  Proceed with reexcision of margins  Procedure: The patient was brought to the operating room identifies a correct patient.  She was placed upon the operating table and general anesthesia was induced.  Her left breast was prepped and draped in usual sterile fashion.  I anesthetized the skin with previous incision at the superior edge of the areola with Marcaine.  I then made an incision through her previous scar with the scalpel.  I then dissected toward the lumpectomy cavity.  Fluid was aspirated from the cavity.  I next excised the anterior and inferior margin which were marked with paint and sent to pathology.  I then excised the superior margin which was marked and sent as well.  Posteriorly, I have resected down to the chest wall so I took the edge of the posterior margin at the superior interface and excised this as well sent to pathology.  Hemostasis was then achieved with the cautery.  I injected Marcaine further into the incision.  The subcutaneous tissue was then closed with interrupted 3-0 Vicryl sutures and the skin was closed with a running 4-0 Monocryl.  Dermabond was then applied.  The patient tolerated the procedure well.  All the counts were correct at the end of the procedure.   The patient was then extubated in the operating room and taken in a stable condition to the recovery room.          Alicia [name]   Date: 08/13/2022  Time: 10:16 AM

## 2022-08-13 NOTE — Transfer of Care (Signed)
Immediate Anesthesia Transfer of Care Note  Patient: Alicia [name]  Procedure(s) Performed: RE-EXCISION OF LEFT BREAST CANCER (Left: Breast)  Patient Location: PACU  Anesthesia Type:General  Level of Consciousness: awake, drowsy, and patient cooperative  Airway & Oxygen Therapy: Patient Spontanous Breathing and Patient connected to face mask oxygen  Post-op Assessment: Report given to RN and Post -op Vital signs reviewed and stable  Post vital signs: Reviewed and stable  Last Vitals:  Vitals Value Taken Time  BP    Temp    Pulse 77 08/13/22 1035  Resp 13 08/13/22 1035  SpO2 99 % 08/13/22 1035  Vitals shown include unvalidated device data.  Last Pain:  Vitals:   08/13/22 0810  TempSrc: Oral  PainSc: 0-No pain      Patients Stated Pain Goal: 4 (18/48/59 2763)  Complications: No notable events documented.

## 2022-08-14 ENCOUNTER — Encounter (HOSPITAL_BASED_OUTPATIENT_CLINIC_OR_DEPARTMENT_OTHER): Payer: Self-pay | Admitting: Surgery

## 2022-08-15 LAB — SURGICAL PATHOLOGY

## 2022-08-18 ENCOUNTER — Encounter: Payer: Self-pay | Admitting: *Deleted

## 2022-08-20 ENCOUNTER — Ambulatory Visit: Payer: Medicare HMO | Admitting: Radiation Oncology

## 2022-08-20 ENCOUNTER — Ambulatory Visit: Payer: Medicare HMO

## 2022-09-02 NOTE — Progress Notes (Signed)
Location of Breast Cancer: Ductal carcinoma in situ (DCIS) of left breast    Histology per Pathology Report:   06/19/2022 Diagnosis Breast, left, needle core biopsy, upper central - DUCTAL CARCINOMA IN SITU, INTERMEDIATE NUCLEAR GRADE, CRIBRIFORM AND PAPILLARY TYPES, PARTIALLY INVOLVING A COMPLEX SCLEROSING LESION. - NECROSIS: NOT IDENTIFIED. - CALCIFICATIONS: PRESENT - DCIS LENGTH: 0.6 CM - NEGATIVE FOR INVASIVE CARCINOMA.  07/16/2022 FINAL MICROSCOPIC DIAGNOSIS:   A. BREAST, LEFT, LUMPECTOMY:  -  Ductal carcinoma in situ (pTis), cribriform and papillary types with  focal necrosis and cancerization of lobules, nuclear grade 2-3 of 3,  involving a complex sclerosing lesion.  -  Calcifications present in DCIS.  -  Biopsy site present.  -  Overall microscopic dimension partially based on size of specimen and  margin status at approximately 30 mm.  -  Superior/posterior margin focally positive.  Anterior and inferior  margins both less than 1 mm, medial margin at 4 mm and lateral margin  >10 mm   08/13/2022 FINAL MICROSCOPIC DIAGNOSIS:   A. BREAST, LEFT ANTERIOR/INFERIOR MARGIN, EXCISION:  - Fibrocystic changes with apocrine metaplasia.  - Usual ductal hyperplasia.  - Biopsy site changes.  - No residual carcinoma identified.   B. BREAST, LEFT SUPERIOR MARGIN, EXCISION:  - Biopsy site changes.  - No residual carcinoma identified.   C. BREAST, LEFT POSTERIOR MARGIN, EXCISION:  - Biopsy site changes.  - No residual carcinoma identified.   Receptor Status: ER(100%), PR (60%), Her2-neu (), Ki-()  Did patient present with symptoms (if so, please note symptoms) or was this found on screening mammography?: screening mammogram  Past/Anticipated interventions by surgeon, if JPV:GKKD BREAST LUMPECTOMY WITH RADIOACTIVE SEED LOCALIZATION 07/16/2022 and LEFT BREAST CANCER POSITIVE MARGIN 08/13/2022 by Dr. Ninfa Linden.   Past/Anticipated interventions by medical oncology, if any:  Antiestrogen therapy to follow radiation.   Lymphedema issues, if any: None     Pain issues, if any:  She reports some soreness/tenderness at the surgical site.  SAFETY ISSUES: Prior radiation? None Pacemaker/ICD? No Possible current pregnancy?no, Hysterectomy Is the patient on methotrexate? No  Current Complaints / other details:      Jacqulyn Liner, RN 09/02/2022,1:06 PM

## 2022-09-09 NOTE — Progress Notes (Signed)
Radiation Oncology         [phone] ________________________________  Name: Alicia [name] MRN: [id number]  Date: 09/10/2022  DOB: 04/07/1941  Re-Evaluation Note  CC: Gaynelle Arabian, MD  Nicholas Lose, MD  No diagnosis found.  Diagnosis:   Stage 0 (cTis (DCIS), cN0, cM0) Left Breast, Intermediate to high-grade DCIS, ER+ / PR+ / Her2 not assessed, s/p lumpectomy   Narrative:  The patient returns today to discuss radiation treatment options. She was seen in the multidisciplinary breast clinic on 07/02/22.   On her consultation date, she agreed to proceed with genetic testing. Results showed no clinically significant variants detected by BRCAplus or +RNAinsight testing.  The patient opted to proceed with a left breast lumpectomy without nodal biopsies on 07/16/22 under the care of Dr. Ninfa Linden. Pathology from the procedure revealed: intermediate to high-grade DCIS measuring approximately 3.0 cm with calcifications, focal necrosis, cancerization of lobules, and involving a complex sclerosing lesion. Superior/posterior margin focally positive for DCIS (the anterior and inferior margins were also both less than 1 mm). Prognostic indicators significant for: estrogen receptor 100% positive and progesterone receptor 60% positive, both with strong staining intensity; Her2 not assessed.   Accordingly, the patient underwent re-excision on 08/13/22. Final path showed all final margins without residual carcinoma.   The patient has met with Dr. Lindi Adie and has agreed to proceed with tamoxifen x 5 years following XRT.   Examination performed during her most recent follow-up visit with Dr. Ninfa Linden on 09/05/22 showed no signs of infection or other concerning findings.   On review of systems, the patient reports ***. She denies *** and any other symptoms.    Allergies:  is allergic to ibuprofen, lexapro [escitalopram oxalate], lisinopril, mobic [meloxicam], nabumetone, prilosec otc [omeprazole  magnesium], zoloft [sertraline hcl], and sulfa antibiotics.  Meds: Current Outpatient Medications  Medication Sig Dispense Refill   acetaminophen (TYLENOL) 650 MG CR tablet Take 1,300 mg by mouth every 8 (eight) hours as needed for pain.     amLODipine (NORVASC) 5 MG tablet Take 2.5 mg by mouth daily.     Bioflavonoid Products (BIOFLEX PO) Take 1 tablet by mouth daily.     celecoxib (CELEBREX) 200 MG capsule Take 200 mg by mouth daily.     Flax Oil-Fish Oil-Borage Oil (FISH-FLAX-BORAGE PO) Take 1 capsule by mouth daily.     fluticasone (FLONASE) 50 MCG/ACT nasal spray Place 1 spray into both nostrils daily. (Patient taking differently: Place 1 spray into both nostrils as needed.) 16 g 0   irbesartan (AVAPRO) 300 MG tablet Take 300 mg by mouth daily.     isosorbide mononitrate (IMDUR) 30 MG 24 hr tablet Take 1/2 (one-half) tablet by mouth once daily 45 tablet 2   LORazepam (ATIVAN) 0.5 MG tablet Take 0.5 mg by mouth 2 (two) times daily as needed for anxiety.     meclizine (ANTIVERT) 25 MG tablet Take 25 mg by mouth 3 (three) times daily as needed for dizziness.     metoprolol succinate (TOPROL-XL) 50 MG 24 hr tablet Take 50 mg by mouth daily.     Multiple Minerals-Vitamins (CAL MAG ZINC +D3 PO) Take 1 tablet by mouth daily.     multivitamin-lutein (OCUVITE-LUTEIN) CAPS Take 830 capsules by mouth daily.     omeprazole (PRILOSEC) 40 MG capsule Take 40 mg by mouth daily.     rOPINIRole (REQUIP) 1 MG tablet Take 1 tablet (1 mg total) by mouth 3 (three) times daily. (Patient taking differently: Take 1-2 mg by  mouth See admin instructions. Take 1 tablet (1 mg) by mouth in the morning & take 2 tablets (2 mg) by mouth at night.) 270 tablet 3   simvastatin (ZOCOR) 20 MG tablet Take 20 mg by mouth every evening.     spironolactone (ALDACTONE) 25 MG tablet Take 25 mg by mouth daily.     traMADol (ULTRAM) 50 MG tablet Take 1 tablet (50 mg total) by mouth every 6 (six) hours as needed. 20 tablet 0    traZODone (DESYREL) 50 MG tablet Take 50 mg by mouth at bedtime.      Current Facility-Administered Medications  Medication Dose Route Frequency Provider Last Rate Last Admin   sodium chloride flush (NS) 0.9 % injection 3 mL  3 mL Intravenous Q12H Lorretta Harp, MD        Physical Findings: The patient is in no acute distress. Patient is alert and oriented.  vitals were not taken for this visit.  No significant changes. Lungs are clear to auscultation bilaterally. Heart has regular rate and rhythm. No palpable cervical, supraclavicular, or axillary adenopathy. Abdomen soft, non-tender, normal bowel sounds. Right Breast: no palpable mass, nipple discharge or bleeding. Left Breast: ***  Lab Findings: Lab Results  Component Value Date   WBC 8.9 07/02/2022   HGB 13.3 07/02/2022   HCT 39.6 07/02/2022   MCV 90.2 07/02/2022   PLT 300 07/02/2022    Radiographic Findings: No results found.  Impression: Stage 0 (cTis (DCIS), cN0, cM0) Left Breast, Intermediate to high-grade DCIS, ER+ / PR+ / Her2 not assessed, s/p lumpectomy   ***  Plan:  Patient is scheduled for CT simulation {date/later today}. ***  -----------------------------------  Blair Promise, PhD, MD  This document serves as a record of services personally performed by Gery Pray, MD. It was created on his behalf by Roney Mans, a trained medical scribe. The creation of this record is based on the scribe's personal observations and the provider's statements to them. This document has been checked and approved by the attending provider.

## 2022-09-10 ENCOUNTER — Ambulatory Visit
Admission: RE | Admit: 2022-09-10 | Discharge: 2022-09-10 | Disposition: A | Discharge status: Home / Self Care | Payer: Medicare HMO | Source: Ambulatory Visit | Attending: Radiation Oncology | Admitting: Radiation Oncology

## 2022-09-10 ENCOUNTER — Encounter: Payer: Self-pay | Admitting: Radiation Oncology

## 2022-09-10 ENCOUNTER — Other Ambulatory Visit: Payer: Self-pay

## 2022-09-10 VITALS — BP 127/58 | HR 63 | Temp 97.9°F | Resp 18 | Ht 61.0 in | Wt 156.5 lb

## 2022-09-10 DIAGNOSIS — Z79899 Other long term (current) drug therapy: Secondary | ICD-10-CM | POA: Insufficient documentation

## 2022-09-10 DIAGNOSIS — D0512 Intraductal carcinoma in situ of left breast: Secondary | ICD-10-CM | POA: Insufficient documentation

## 2022-09-10 DIAGNOSIS — Z170 Estrogen receptor positive status [ER+]: Secondary | ICD-10-CM | POA: Insufficient documentation

## 2022-09-10 DIAGNOSIS — Z791 Long term (current) use of non-steroidal anti-inflammatories (NSAID): Secondary | ICD-10-CM | POA: Insufficient documentation

## 2022-09-10 DIAGNOSIS — Z510 Encounter for antineoplastic radiation therapy: Secondary | ICD-10-CM | POA: Insufficient documentation

## 2022-09-15 DIAGNOSIS — Z510 Encounter for antineoplastic radiation therapy: Secondary | ICD-10-CM | POA: Diagnosis not present

## 2022-09-15 DIAGNOSIS — Z170 Estrogen receptor positive status [ER+]: Secondary | ICD-10-CM | POA: Diagnosis not present

## 2022-09-15 DIAGNOSIS — D0512 Intraductal carcinoma in situ of left breast: Secondary | ICD-10-CM | POA: Diagnosis not present

## 2022-09-16 ENCOUNTER — Encounter: Payer: Self-pay | Admitting: *Deleted

## 2022-09-17 ENCOUNTER — Other Ambulatory Visit: Payer: Self-pay

## 2022-09-17 ENCOUNTER — Ambulatory Visit
Admission: RE | Admit: 2022-09-17 | Discharge: 2022-09-17 | Disposition: A | Discharge status: Home / Self Care | Payer: Medicare HMO | Source: Ambulatory Visit | Attending: Radiation Oncology | Admitting: Radiation Oncology

## 2022-09-17 DIAGNOSIS — D0512 Intraductal carcinoma in situ of left breast: Secondary | ICD-10-CM

## 2022-09-17 DIAGNOSIS — Z170 Estrogen receptor positive status [ER+]: Secondary | ICD-10-CM | POA: Diagnosis not present

## 2022-09-17 DIAGNOSIS — Z510 Encounter for antineoplastic radiation therapy: Secondary | ICD-10-CM | POA: Diagnosis not present

## 2022-09-17 LAB — RAD ONC ARIA SESSION SUMMARY
Course Elapsed Days: 0
Plan Fractions Treated to Date: 1
Plan Prescribed Dose Per Fraction: 2.67 Gy
Plan Total Fractions Prescribed: 16
Plan Total Prescribed Dose: 42.72 Gy
Reference Point Dosage Given to Date: 2.67 Gy
Reference Point Session Dosage Given: 2.67 Gy
Session Number: 1

## 2022-09-18 ENCOUNTER — Ambulatory Visit
Admission: RE | Admit: 2022-09-18 | Discharge: 2022-09-18 | Disposition: A | Discharge status: Home / Self Care | Payer: Medicare HMO | Source: Ambulatory Visit | Attending: Radiation Oncology | Admitting: Radiation Oncology

## 2022-09-18 ENCOUNTER — Other Ambulatory Visit: Payer: Self-pay

## 2022-09-18 DIAGNOSIS — Z510 Encounter for antineoplastic radiation therapy: Secondary | ICD-10-CM | POA: Diagnosis not present

## 2022-09-18 DIAGNOSIS — Z170 Estrogen receptor positive status [ER+]: Secondary | ICD-10-CM | POA: Diagnosis not present

## 2022-09-18 DIAGNOSIS — D0512 Intraductal carcinoma in situ of left breast: Secondary | ICD-10-CM | POA: Diagnosis not present

## 2022-09-18 LAB — RAD ONC ARIA SESSION SUMMARY
Course Elapsed Days: 1
Plan Fractions Treated to Date: 2
Plan Prescribed Dose Per Fraction: 2.67 Gy
Plan Total Fractions Prescribed: 16
Plan Total Prescribed Dose: 42.72 Gy
Reference Point Dosage Given to Date: 5.34 Gy
Reference Point Session Dosage Given: 2.67 Gy
Session Number: 2

## 2022-09-19 ENCOUNTER — Other Ambulatory Visit: Payer: Self-pay

## 2022-09-19 ENCOUNTER — Ambulatory Visit
Admission: RE | Admit: 2022-09-19 | Discharge: 2022-09-19 | Disposition: A | Discharge status: Home / Self Care | Payer: Medicare HMO | Source: Ambulatory Visit | Attending: Radiation Oncology | Admitting: Radiation Oncology

## 2022-09-19 DIAGNOSIS — D0512 Intraductal carcinoma in situ of left breast: Secondary | ICD-10-CM | POA: Diagnosis not present

## 2022-09-19 DIAGNOSIS — Z510 Encounter for antineoplastic radiation therapy: Secondary | ICD-10-CM | POA: Diagnosis not present

## 2022-09-19 DIAGNOSIS — Z170 Estrogen receptor positive status [ER+]: Secondary | ICD-10-CM | POA: Diagnosis not present

## 2022-09-19 LAB — RAD ONC ARIA SESSION SUMMARY
Course Elapsed Days: 2
Plan Fractions Treated to Date: 3
Plan Prescribed Dose Per Fraction: 2.67 Gy
Plan Total Fractions Prescribed: 16
Plan Total Prescribed Dose: 42.72 Gy
Reference Point Dosage Given to Date: 8.01 Gy
Reference Point Session Dosage Given: 2.67 Gy
Session Number: 3

## 2022-09-22 ENCOUNTER — Ambulatory Visit
Admission: RE | Admit: 2022-09-22 | Discharge: 2022-09-22 | Disposition: A | Discharge status: Home / Self Care | Payer: Medicare HMO | Source: Ambulatory Visit | Attending: Radiation Oncology | Admitting: Radiation Oncology

## 2022-09-22 ENCOUNTER — Other Ambulatory Visit: Payer: Self-pay

## 2022-09-22 DIAGNOSIS — Z170 Estrogen receptor positive status [ER+]: Secondary | ICD-10-CM | POA: Diagnosis not present

## 2022-09-22 DIAGNOSIS — D0512 Intraductal carcinoma in situ of left breast: Secondary | ICD-10-CM | POA: Diagnosis not present

## 2022-09-22 DIAGNOSIS — Z510 Encounter for antineoplastic radiation therapy: Secondary | ICD-10-CM | POA: Diagnosis not present

## 2022-09-22 LAB — RAD ONC ARIA SESSION SUMMARY
Course Elapsed Days: 5
Plan Fractions Treated to Date: 4
Plan Prescribed Dose Per Fraction: 2.67 Gy
Plan Total Fractions Prescribed: 16
Plan Total Prescribed Dose: 42.72 Gy
Reference Point Dosage Given to Date: 10.68 Gy
Reference Point Session Dosage Given: 2.67 Gy
Session Number: 4

## 2022-09-23 ENCOUNTER — Other Ambulatory Visit: Payer: Self-pay

## 2022-09-23 ENCOUNTER — Ambulatory Visit
Admission: RE | Admit: 2022-09-23 | Discharge: 2022-09-23 | Disposition: A | Discharge status: Home / Self Care | Payer: Medicare HMO | Source: Ambulatory Visit | Attending: Radiation Oncology | Admitting: Radiation Oncology

## 2022-09-23 DIAGNOSIS — Z170 Estrogen receptor positive status [ER+]: Secondary | ICD-10-CM | POA: Diagnosis not present

## 2022-09-23 DIAGNOSIS — Z510 Encounter for antineoplastic radiation therapy: Secondary | ICD-10-CM | POA: Diagnosis not present

## 2022-09-23 DIAGNOSIS — D0512 Intraductal carcinoma in situ of left breast: Secondary | ICD-10-CM | POA: Diagnosis not present

## 2022-09-23 LAB — RAD ONC ARIA SESSION SUMMARY
Course Elapsed Days: 6
Plan Fractions Treated to Date: 5
Plan Prescribed Dose Per Fraction: 2.67 Gy
Plan Total Fractions Prescribed: 16
Plan Total Prescribed Dose: 42.72 Gy
Reference Point Dosage Given to Date: 13.35 Gy
Reference Point Session Dosage Given: 2.67 Gy
Session Number: 5

## 2022-09-23 MED ORDER — ALRA NON-METALLIC DEODORANT (RAD-ONC)
1.0000 | Freq: Once | TOPICAL | Status: AC
  Administered 2022-09-23: 1 via TOPICAL

## 2022-09-23 MED ORDER — RADIAPLEXRX EX GEL
Freq: Once | CUTANEOUS | Status: AC
  Administered 2022-09-23: 1 via TOPICAL

## 2022-09-24 ENCOUNTER — Other Ambulatory Visit: Payer: Self-pay

## 2022-09-24 ENCOUNTER — Ambulatory Visit
Admission: RE | Admit: 2022-09-24 | Discharge: 2022-09-24 | Disposition: A | Discharge status: Home / Self Care | Payer: Medicare HMO | Source: Ambulatory Visit | Attending: Radiation Oncology | Admitting: Radiation Oncology

## 2022-09-24 DIAGNOSIS — Z170 Estrogen receptor positive status [ER+]: Secondary | ICD-10-CM | POA: Diagnosis not present

## 2022-09-24 DIAGNOSIS — D0512 Intraductal carcinoma in situ of left breast: Secondary | ICD-10-CM | POA: Diagnosis not present

## 2022-09-24 DIAGNOSIS — Z510 Encounter for antineoplastic radiation therapy: Secondary | ICD-10-CM | POA: Diagnosis not present

## 2022-09-24 LAB — RAD ONC ARIA SESSION SUMMARY
Course Elapsed Days: 7
Plan Fractions Treated to Date: 6
Plan Prescribed Dose Per Fraction: 2.67 Gy
Plan Total Fractions Prescribed: 16
Plan Total Prescribed Dose: 42.72 Gy
Reference Point Dosage Given to Date: 16.02 Gy
Reference Point Session Dosage Given: 2.67 Gy
Session Number: 6

## 2022-09-25 ENCOUNTER — Ambulatory Visit: Payer: Medicare HMO

## 2022-09-26 ENCOUNTER — Ambulatory Visit: Payer: Medicare HMO

## 2022-09-26 ENCOUNTER — Telehealth: Payer: Self-pay

## 2022-09-26 DIAGNOSIS — U071 COVID-19: Secondary | ICD-10-CM | POA: Diagnosis not present

## 2022-09-26 DIAGNOSIS — J069 Acute upper respiratory infection, unspecified: Secondary | ICD-10-CM | POA: Diagnosis not present

## 2022-09-26 NOTE — Telephone Encounter (Signed)
Patient called in to report that she had been exposed to covid and she is not feeling well. Radiation treatment canceled for today, patient to be tested for covid and update radiation oncology staff with results prior to returning on 09/30/22.

## 2022-09-30 ENCOUNTER — Ambulatory Visit
Admission: RE | Admit: 2022-09-30 | Discharge: 2022-09-30 | Disposition: A | Discharge status: Home / Self Care | Payer: Medicare HMO | Source: Ambulatory Visit | Attending: Radiation Oncology | Admitting: Radiation Oncology

## 2022-09-30 ENCOUNTER — Telehealth: Payer: Self-pay

## 2022-09-30 ENCOUNTER — Ambulatory Visit: Payer: Medicare HMO

## 2022-09-30 ENCOUNTER — Other Ambulatory Visit: Payer: Self-pay

## 2022-09-30 DIAGNOSIS — Z510 Encounter for antineoplastic radiation therapy: Secondary | ICD-10-CM | POA: Diagnosis not present

## 2022-09-30 DIAGNOSIS — Z170 Estrogen receptor positive status [ER+]: Secondary | ICD-10-CM | POA: Diagnosis not present

## 2022-09-30 DIAGNOSIS — D0512 Intraductal carcinoma in situ of left breast: Secondary | ICD-10-CM | POA: Diagnosis not present

## 2022-09-30 LAB — RAD ONC ARIA SESSION SUMMARY
Course Elapsed Days: 13
Plan Fractions Treated to Date: 7
Plan Prescribed Dose Per Fraction: 2.67 Gy
Plan Total Fractions Prescribed: 16
Plan Total Prescribed Dose: 42.72 Gy
Reference Point Dosage Given to Date: 18.69 Gy
Reference Point Session Dosage Given: 2.67 Gy
Session Number: 7

## 2022-09-30 NOTE — Telephone Encounter (Signed)
Patient called in to report that she tested positive for covid on 09/26/22. Patient states she is no longer having symptoms. She is questioning if she can come in for radiation treatment today. Reached out to [facility] whom states patient may be treated last today. Radiation therapist to call patient with instructions on entering the cancer center.

## 2022-10-01 ENCOUNTER — Other Ambulatory Visit: Payer: Self-pay

## 2022-10-01 ENCOUNTER — Ambulatory Visit
Admission: RE | Admit: 2022-10-01 | Discharge: 2022-10-01 | Disposition: A | Discharge status: Home / Self Care | Payer: Medicare HMO | Source: Ambulatory Visit | Attending: Radiation Oncology | Admitting: Radiation Oncology

## 2022-10-01 DIAGNOSIS — Z510 Encounter for antineoplastic radiation therapy: Secondary | ICD-10-CM | POA: Diagnosis not present

## 2022-10-01 DIAGNOSIS — D0512 Intraductal carcinoma in situ of left breast: Secondary | ICD-10-CM | POA: Diagnosis not present

## 2022-10-01 DIAGNOSIS — Z170 Estrogen receptor positive status [ER+]: Secondary | ICD-10-CM | POA: Diagnosis not present

## 2022-10-01 LAB — RAD ONC ARIA SESSION SUMMARY
Course Elapsed Days: 14
Plan Fractions Treated to Date: 8
Plan Prescribed Dose Per Fraction: 2.67 Gy
Plan Total Fractions Prescribed: 16
Plan Total Prescribed Dose: 42.72 Gy
Reference Point Dosage Given to Date: 21.36 Gy
Reference Point Session Dosage Given: 2.67 Gy
Session Number: 8

## 2022-10-02 ENCOUNTER — Ambulatory Visit
Admission: RE | Admit: 2022-10-02 | Discharge: 2022-10-02 | Disposition: A | Discharge status: Home / Self Care | Payer: Medicare HMO | Source: Ambulatory Visit | Attending: Radiation Oncology | Admitting: Radiation Oncology

## 2022-10-02 ENCOUNTER — Other Ambulatory Visit: Payer: Self-pay

## 2022-10-02 DIAGNOSIS — D0512 Intraductal carcinoma in situ of left breast: Secondary | ICD-10-CM | POA: Diagnosis not present

## 2022-10-02 DIAGNOSIS — Z510 Encounter for antineoplastic radiation therapy: Secondary | ICD-10-CM | POA: Diagnosis not present

## 2022-10-02 DIAGNOSIS — Z170 Estrogen receptor positive status [ER+]: Secondary | ICD-10-CM | POA: Diagnosis not present

## 2022-10-02 LAB — RAD ONC ARIA SESSION SUMMARY
Course Elapsed Days: 15
Plan Fractions Treated to Date: 9
Plan Prescribed Dose Per Fraction: 2.67 Gy
Plan Total Fractions Prescribed: 16
Plan Total Prescribed Dose: 42.72 Gy
Reference Point Dosage Given to Date: 24.03 Gy
Reference Point Session Dosage Given: 2.67 Gy
Session Number: 9

## 2022-10-03 ENCOUNTER — Other Ambulatory Visit: Payer: Self-pay

## 2022-10-03 ENCOUNTER — Ambulatory Visit
Admission: RE | Admit: 2022-10-03 | Discharge: 2022-10-03 | Disposition: A | Discharge status: Home / Self Care | Payer: Medicare HMO | Source: Ambulatory Visit | Attending: Radiation Oncology | Admitting: Radiation Oncology

## 2022-10-03 DIAGNOSIS — D0512 Intraductal carcinoma in situ of left breast: Secondary | ICD-10-CM | POA: Diagnosis not present

## 2022-10-03 DIAGNOSIS — Z510 Encounter for antineoplastic radiation therapy: Secondary | ICD-10-CM | POA: Diagnosis not present

## 2022-10-03 DIAGNOSIS — Z170 Estrogen receptor positive status [ER+]: Secondary | ICD-10-CM | POA: Diagnosis not present

## 2022-10-03 LAB — RAD ONC ARIA SESSION SUMMARY
Course Elapsed Days: 16
Plan Fractions Treated to Date: 10
Plan Prescribed Dose Per Fraction: 2.67 Gy
Plan Total Fractions Prescribed: 16
Plan Total Prescribed Dose: 42.72 Gy
Reference Point Dosage Given to Date: 26.7 Gy
Reference Point Session Dosage Given: 2.67 Gy
Session Number: 10

## 2022-10-06 NOTE — Progress Notes (Signed)
Patient Care Team: Gaynelle Arabian, MD as PCP - General (Family Medicine) Coralie Keens, MD as Consulting Physician (General Surgery) Nicholas Lose, MD as Consulting Physician (Hematology and Oncology) Gery Pray, MD as Consulting Physician (Radiation Oncology) Mauro Kaufmann, RN as Oncology Nurse Navigator Rockwell Germany, RN as Oncology Nurse Navigator  DIAGNOSIS:  Encounter Diagnosis  Name Primary?   Ductal carcinoma in situ (DCIS) of left breast Yes    SUMMARY OF ONCOLOGIC HISTORY: Oncology History  Ductal carcinoma in situ (DCIS) of left breast  06/19/2022 Initial Diagnosis   Screening detected distortion with calcifications in the left breast central 12 o'clock position measuring 1.9 cm.  No ultrasound correlate.  Axilla negative.  Stereotactic biopsy revealed intermediate grade DCIS cribriform and papillary features ER 100%, PR 60%   07/02/2022 Cancer Staging   Staging form: Breast, AJCC 8th Edition - Clinical: Stage 0 (cTis (DCIS), cN0, cM0, G2, ER+, PR+) - Signed by Nicholas Lose, MD on 07/02/2022 Stage prefix: Initial diagnosis Histologic grading system: 3 grade system    Genetic Testing   [facility] CustomNext Panel was Negative. Report date is 07/11/2022.  The CustomNext-Cancer+RNAinsight panel offered by [facility] includes sequencing and rearrangement analysis for the following 47 genes:  APC, ATM, AXIN2, BARD1, BMPR1A, BRCA1, BRCA2, BRIP1, CDH1, CDK4, CDKN2A, CHEK2, CTNNA1, DICER1, EPCAM, GREM1, HOXB13, KIT, MEN1, MLH1, MSH2, MSH3, MSH6, MUTYH, NBN, NF1, NTHL1, PALB2, PDGFRA, PMS2, POLD1, POLE, PTEN, RAD50, RAD51C, RAD51D, SDHA, SDHB, SDHC, SDHD, SMAD4, SMARCA4, STK11, TP53, TSC1, TSC2, and VHL.  RNA data is routinely analyzed for use in variant interpretation for all genes.   07/16/2022 Surgery   Left lumpectomy: DCIS cribriform and papillary types with focal necrosis grade 3 involving a complex sclerosing lesion, margins focally positive with  superior/posterior margin ER 100%, PR 60%   08/13/2022 Surgery   Reexcision of the margins: Benign     CHIEF COMPLIANT: left breast DCIS/post rad  INTERVAL HISTORY: Alicia [name] is a 82 y.o with the above-mentioned left breast DCIS. She presents to the clinic for a follow-up. She reports radiation went well. She states she did have some radiation dermatitis.    ALLERGIES:  is allergic to ibuprofen, lexapro [escitalopram oxalate], lisinopril, mobic [meloxicam], nabumetone, prilosec otc [omeprazole magnesium], zoloft [sertraline hcl], and sulfa antibiotics.  MEDICATIONS:  Current Outpatient Medications  Medication Sig Dispense Refill   acetaminophen (TYLENOL) 650 MG CR tablet Take 1,300 mg by mouth every 8 (eight) hours as needed for pain.     amLODipine (NORVASC) 2.5 MG tablet Take 2.5 mg by mouth daily.     AREXVY 120 MCG/0.5ML injection Inject 0.5 mLs into the muscle once.     benzonatate (TESSALON) 200 MG capsule Take 200 mg by mouth 3 (three) times daily as needed.     Bioflavonoid Products (BIOFLEX PO) Take 1 tablet by mouth daily.     celecoxib (CELEBREX) 200 MG capsule Take 200 mg by mouth daily.     Flax Oil-Fish Oil-Borage Oil (FISH-FLAX-BORAGE PO) Take 1 capsule by mouth daily.     fluticasone (FLONASE) 50 MCG/ACT nasal spray Place 1 spray into both nostrils daily. (Patient taking differently: Place 1 spray into both nostrils as needed.) 16 g 0   irbesartan (AVAPRO) 300 MG tablet Take 300 mg by mouth daily.     isosorbide mononitrate (IMDUR) 30 MG 24 hr tablet Take 1/2 (one-half) tablet by mouth once daily 45 tablet 2   LORazepam (ATIVAN) 0.5 MG tablet Take 0.5 mg by mouth 2 (two)  times daily as needed for anxiety.     meclizine (ANTIVERT) 25 MG tablet Take 25 mg by mouth 3 (three) times daily as needed for dizziness.     metoprolol succinate (TOPROL-XL) 50 MG 24 hr tablet Take 50 mg by mouth daily.     Multiple Minerals-Vitamins (CAL MAG ZINC +D3 PO) Take 1 tablet by mouth  daily.     multivitamin-lutein (OCUVITE-LUTEIN) CAPS Take 830 capsules by mouth daily.     omeprazole (PRILOSEC) 40 MG capsule Take 40 mg by mouth daily.     PAXLOVID, 150/100, 10 x 150 MG & 10 x 100MG TBPK Take 2 tablets by mouth 2 (two) times daily.     rOPINIRole (REQUIP) 1 MG tablet Take 1 tablet (1 mg total) by mouth 3 (three) times daily. (Patient taking differently: Take 1-2 mg by mouth See admin instructions. Take 1 tablet (1 mg) by mouth in the morning & take 2 tablets (2 mg) by mouth at night.) 270 tablet 3   simvastatin (ZOCOR) 20 MG tablet Take 20 mg by mouth every evening.     spironolactone (ALDACTONE) 25 MG tablet Take 25 mg by mouth daily.     tamoxifen (NOLVADEX) 10 MG tablet Take 1 tablet (10 mg total) by mouth daily. 90 tablet 3   traZODone (DESYREL) 50 MG tablet Take 50 mg by mouth at bedtime.      Current Facility-Administered Medications  Medication Dose Route Frequency Provider Last Rate Last Admin   sodium chloride flush (NS) 0.9 % injection 3 mL  3 mL Intravenous Q12H Lorretta Harp, MD        PHYSICAL EXAMINATION: ECOG PERFORMANCE STATUS: 1 - Symptomatic but completely ambulatory  Vitals:   10/10/22 1039  BP: (!) 116/54  Pulse: 67  Temp: 98.3 F (36.8 C)  SpO2: 97%   Filed Weights   10/10/22 1039  Weight: 156 lb 1.6 oz (70.8 kg)      LABORATORY DATA:  I have reviewed the data as listed    Latest Ref Rng & Units 08/13/2022    8:02 AM 08/11/2022    3:14 PM 07/02/2022   12:05 PM  CMP  Glucose 70 - 99 mg/dL 126  100  127   BUN 8 - 23 mg/dL 37  41  39   Creatinine 0.44 - 1.00 mg/dL 1.26  1.42  1.40   Sodium 135 - 145 mmol/L 136  136  136   Potassium 3.5 - 5.1 mmol/L 3.9  5.6  4.4   Chloride 98 - 111 mmol/L 105  101  104   CO2 22 - 32 mmol/L _0 Calcium 8.9 - 10.3 mg/dL 9.9  10.1  10.3   Total Protein 6.5 - 8.1 g/dL   7.4   Total Bilirubin 0.3 - 1.2 mg/dL   0.6   Alkaline Phos 38 - 126 U/L   77   AST 15 - 41 U/L   15   ALT 0 - 44 U/L    10     Lab Results  Component Value Date   WBC 8.9 07/02/2022   HGB 13.3 07/02/2022   HCT 39.6 07/02/2022   MCV 90.2 07/02/2022   PLT 300 07/02/2022   NEUTROABS 5.7 07/02/2022    ASSESSMENT & PLAN:  Ductal carcinoma in situ (DCIS) of left breast 06/19/2022: Screening detected distortion with calcifications in the left breast central 12 o'clock position measuring 1.9 cm.  No ultrasound correlate.  Axilla negative.  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 Followed by adjuvant radiation therapy 09/18/2022-10/14/2022 3. Followed by antiestrogen therapy with tamoxifen 5 years  Tamoxifen counseling: We discussed the risks and benefits of tamoxifen. These include but not limited to insomnia, hot flashes, mood changes, vaginal dryness, and weight gain. Although rare, serious side effects including  risk of blood clots were also discussed. We strongly believe that the benefits far outweigh the risks. Patient understands these risks and consented to starting treatment. Planned treatment duration is 5 years.  Return to clinic in 3 months for survivorship care plan visit    No orders of the defined types were placed in this encounter.  The patient has a good understanding of the overall plan. she agrees with it. she will call with any problems that may develop before the next visit here. Total time spent: 30 mins including face to face time and time spent for planning, charting and co-ordination of care   Harriette Ohara, MD 10/10/22    I Gardiner Coins am acting as a Education administrator for [employer]  I have reviewed the above documentation for accuracy and completeness,  and I agree with the above.

## 2022-10-07 ENCOUNTER — Ambulatory Visit
Admission: RE | Admit: 2022-10-07 | Discharge: 2022-10-07 | Disposition: A | Discharge status: Home / Self Care | Payer: Medicare HMO | Source: Ambulatory Visit | Attending: Radiation Oncology | Admitting: Radiation Oncology

## 2022-10-07 ENCOUNTER — Other Ambulatory Visit: Payer: Self-pay

## 2022-10-07 DIAGNOSIS — Z170 Estrogen receptor positive status [ER+]: Secondary | ICD-10-CM | POA: Diagnosis not present

## 2022-10-07 DIAGNOSIS — Z510 Encounter for antineoplastic radiation therapy: Secondary | ICD-10-CM | POA: Diagnosis not present

## 2022-10-07 DIAGNOSIS — D0512 Intraductal carcinoma in situ of left breast: Secondary | ICD-10-CM | POA: Insufficient documentation

## 2022-10-07 LAB — RAD ONC ARIA SESSION SUMMARY
Course Elapsed Days: 20
Plan Fractions Treated to Date: 11
Plan Prescribed Dose Per Fraction: 2.67 Gy
Plan Total Fractions Prescribed: 16
Plan Total Prescribed Dose: 42.72 Gy
Reference Point Dosage Given to Date: 29.37 Gy
Reference Point Session Dosage Given: 2.67 Gy
Session Number: 11

## 2022-10-08 ENCOUNTER — Other Ambulatory Visit: Payer: Self-pay

## 2022-10-08 ENCOUNTER — Ambulatory Visit
Admission: RE | Admit: 2022-10-08 | Discharge: 2022-10-08 | Disposition: A | Discharge status: Home / Self Care | Payer: Medicare HMO | Source: Ambulatory Visit | Attending: Radiation Oncology | Admitting: Radiation Oncology

## 2022-10-08 DIAGNOSIS — D0512 Intraductal carcinoma in situ of left breast: Secondary | ICD-10-CM | POA: Diagnosis not present

## 2022-10-08 DIAGNOSIS — Z170 Estrogen receptor positive status [ER+]: Secondary | ICD-10-CM | POA: Diagnosis not present

## 2022-10-08 DIAGNOSIS — Z510 Encounter for antineoplastic radiation therapy: Secondary | ICD-10-CM | POA: Diagnosis not present

## 2022-10-08 LAB — RAD ONC ARIA SESSION SUMMARY
Course Elapsed Days: 21
Plan Fractions Treated to Date: 12
Plan Prescribed Dose Per Fraction: 2.67 Gy
Plan Total Fractions Prescribed: 16
Plan Total Prescribed Dose: 42.72 Gy
Reference Point Dosage Given to Date: 32.04 Gy
Reference Point Session Dosage Given: 2.67 Gy
Session Number: 12

## 2022-10-09 ENCOUNTER — Other Ambulatory Visit: Payer: Self-pay

## 2022-10-09 ENCOUNTER — Ambulatory Visit
Admission: RE | Admit: 2022-10-09 | Discharge: 2022-10-09 | Disposition: A | Discharge status: Home / Self Care | Payer: Medicare HMO | Source: Ambulatory Visit | Attending: Radiation Oncology | Admitting: Radiation Oncology

## 2022-10-09 DIAGNOSIS — Z170 Estrogen receptor positive status [ER+]: Secondary | ICD-10-CM | POA: Diagnosis not present

## 2022-10-09 DIAGNOSIS — Z510 Encounter for antineoplastic radiation therapy: Secondary | ICD-10-CM | POA: Diagnosis not present

## 2022-10-09 DIAGNOSIS — D0512 Intraductal carcinoma in situ of left breast: Secondary | ICD-10-CM | POA: Diagnosis not present

## 2022-10-09 LAB — RAD ONC ARIA SESSION SUMMARY
Course Elapsed Days: 22
Plan Fractions Treated to Date: 13
Plan Prescribed Dose Per Fraction: 2.67 Gy
Plan Total Fractions Prescribed: 16
Plan Total Prescribed Dose: 42.72 Gy
Reference Point Dosage Given to Date: 34.71 Gy
Reference Point Session Dosage Given: 2.67 Gy
Session Number: 13

## 2022-10-10 ENCOUNTER — Ambulatory Visit
Admission: RE | Admit: 2022-10-10 | Discharge: 2022-10-10 | Disposition: A | Discharge status: Home / Self Care | Payer: Medicare HMO | Source: Ambulatory Visit | Attending: Radiation Oncology | Admitting: Radiation Oncology

## 2022-10-10 ENCOUNTER — Ambulatory Visit: Payer: Medicare HMO

## 2022-10-10 ENCOUNTER — Inpatient Hospital Stay: Payer: Medicare HMO | Attending: Hematology and Oncology | Admitting: Hematology and Oncology

## 2022-10-10 ENCOUNTER — Other Ambulatory Visit: Payer: Self-pay

## 2022-10-10 VITALS — BP 116/54 | HR 67 | Temp 98.3°F | Wt 156.1 lb

## 2022-10-10 DIAGNOSIS — Z170 Estrogen receptor positive status [ER+]: Secondary | ICD-10-CM | POA: Insufficient documentation

## 2022-10-10 DIAGNOSIS — Z79899 Other long term (current) drug therapy: Secondary | ICD-10-CM | POA: Insufficient documentation

## 2022-10-10 DIAGNOSIS — D0512 Intraductal carcinoma in situ of left breast: Secondary | ICD-10-CM | POA: Diagnosis not present

## 2022-10-10 DIAGNOSIS — Z510 Encounter for antineoplastic radiation therapy: Secondary | ICD-10-CM | POA: Diagnosis not present

## 2022-10-10 LAB — RAD ONC ARIA SESSION SUMMARY
Course Elapsed Days: 23
Plan Fractions Treated to Date: 14
Plan Prescribed Dose Per Fraction: 2.67 Gy
Plan Total Fractions Prescribed: 16
Plan Total Prescribed Dose: 42.72 Gy
Reference Point Dosage Given to Date: 37.38 Gy
Reference Point Session Dosage Given: 2.67 Gy
Session Number: 14

## 2022-10-10 MED ORDER — RADIAPLEXRX EX GEL: Freq: Once | CUTANEOUS | Status: AC

## 2022-10-10 MED ORDER — TAMOXIFEN CITRATE 10 MG PO TABS: 10.0000 mg | ORAL_TABLET | Freq: Every day | ORAL | 3 refills | Status: DC

## 2022-10-10 NOTE — Assessment & Plan Note (Signed)
06/19/2022: Screening detected distortion with calcifications in the left breast central 12 o'clock position measuring 1.9 cm.  No ultrasound correlate.  Axilla negative.  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 Followed by adjuvant radiation therapy 09/18/2022-10/14/2022 3. Followed by antiestrogen therapy with tamoxifen 5 years  Tamoxifen counseling: We discussed the risks and benefits of tamoxifen. These include but not limited to insomnia, hot flashes, mood changes, vaginal dryness, and weight gain. Although rare, serious side effects including endometrial cancer, risk of blood clots were also discussed. We strongly believe that the benefits far outweigh the risks. Patient understands these risks and consented to starting treatment. Planned treatment duration is 5 years.  Return to clinic in 3 months for survivorship care plan visit

## 2022-10-13 ENCOUNTER — Ambulatory Visit
Admission: RE | Admit: 2022-10-13 | Discharge: 2022-10-13 | Disposition: A | Discharge status: Home / Self Care | Payer: Medicare HMO | Source: Ambulatory Visit | Attending: Radiation Oncology | Admitting: Radiation Oncology

## 2022-10-13 ENCOUNTER — Other Ambulatory Visit: Payer: Self-pay

## 2022-10-13 ENCOUNTER — Encounter: Payer: Self-pay | Admitting: *Deleted

## 2022-10-13 ENCOUNTER — Ambulatory Visit: Payer: Medicare HMO

## 2022-10-13 DIAGNOSIS — Z510 Encounter for antineoplastic radiation therapy: Secondary | ICD-10-CM | POA: Diagnosis not present

## 2022-10-13 DIAGNOSIS — D0512 Intraductal carcinoma in situ of left breast: Secondary | ICD-10-CM

## 2022-10-13 DIAGNOSIS — Z170 Estrogen receptor positive status [ER+]: Secondary | ICD-10-CM | POA: Diagnosis not present

## 2022-10-13 LAB — RAD ONC ARIA SESSION SUMMARY
Course Elapsed Days: 26
Plan Fractions Treated to Date: 15
Plan Prescribed Dose Per Fraction: 2.67 Gy
Plan Total Fractions Prescribed: 16
Plan Total Prescribed Dose: 42.72 Gy
Reference Point Dosage Given to Date: 40.05 Gy
Reference Point Session Dosage Given: 2.67 Gy
Session Number: 15

## 2022-10-14 ENCOUNTER — Other Ambulatory Visit: Payer: Self-pay

## 2022-10-14 ENCOUNTER — Ambulatory Visit
Admission: RE | Admit: 2022-10-14 | Discharge: 2022-10-14 | Disposition: A | Discharge status: Home / Self Care | Payer: Medicare HMO | Source: Ambulatory Visit | Attending: Radiation Oncology | Admitting: Radiation Oncology

## 2022-10-14 ENCOUNTER — Ambulatory Visit: Payer: Medicare HMO

## 2022-10-14 DIAGNOSIS — Z170 Estrogen receptor positive status [ER+]: Secondary | ICD-10-CM | POA: Diagnosis not present

## 2022-10-14 DIAGNOSIS — Z510 Encounter for antineoplastic radiation therapy: Secondary | ICD-10-CM | POA: Diagnosis not present

## 2022-10-14 DIAGNOSIS — D0512 Intraductal carcinoma in situ of left breast: Secondary | ICD-10-CM

## 2022-10-14 LAB — RAD ONC ARIA SESSION SUMMARY
Course Elapsed Days: 27
Plan Fractions Treated to Date: 16
Plan Prescribed Dose Per Fraction: 2.67 Gy
Plan Total Fractions Prescribed: 16
Plan Total Prescribed Dose: 42.72 Gy
Reference Point Dosage Given to Date: 42.72 Gy
Reference Point Session Dosage Given: 2.67 Gy
Session Number: 16

## 2022-10-14 MED ORDER — RADIAPLEXRX EX GEL: Freq: Once | CUTANEOUS | Status: AC

## 2022-10-23 ENCOUNTER — Encounter (HOSPITAL_COMMUNITY): Payer: Self-pay

## 2022-10-23 DIAGNOSIS — M899 Disorder of bone, unspecified: Secondary | ICD-10-CM | POA: Diagnosis not present

## 2022-10-23 DIAGNOSIS — R69 Illness, unspecified: Secondary | ICD-10-CM | POA: Diagnosis not present

## 2022-10-23 DIAGNOSIS — E7800 Pure hypercholesterolemia, unspecified: Secondary | ICD-10-CM | POA: Diagnosis not present

## 2022-10-23 DIAGNOSIS — N183 Chronic kidney disease, stage 3 unspecified: Secondary | ICD-10-CM | POA: Diagnosis not present

## 2022-10-23 DIAGNOSIS — Z0000 Encounter for general adult medical examination without abnormal findings: Secondary | ICD-10-CM | POA: Diagnosis not present

## 2022-10-23 DIAGNOSIS — G479 Sleep disorder, unspecified: Secondary | ICD-10-CM | POA: Diagnosis not present

## 2022-10-23 DIAGNOSIS — D0510 Intraductal carcinoma in situ of unspecified breast: Secondary | ICD-10-CM | POA: Diagnosis not present

## 2022-10-23 DIAGNOSIS — F411 Generalized anxiety disorder: Secondary | ICD-10-CM | POA: Diagnosis not present

## 2022-10-23 DIAGNOSIS — Z1331 Encounter for screening for depression: Secondary | ICD-10-CM | POA: Diagnosis not present

## 2022-10-23 DIAGNOSIS — K219 Gastro-esophageal reflux disease without esophagitis: Secondary | ICD-10-CM | POA: Diagnosis not present

## 2022-10-23 DIAGNOSIS — I700 Atherosclerosis of aorta: Secondary | ICD-10-CM | POA: Diagnosis not present

## 2022-10-23 DIAGNOSIS — M1990 Unspecified osteoarthritis, unspecified site: Secondary | ICD-10-CM | POA: Diagnosis not present

## 2022-10-23 DIAGNOSIS — I10 Essential (primary) hypertension: Secondary | ICD-10-CM | POA: Diagnosis not present

## 2022-10-30 NOTE — Radiation Completion Notes (Signed)
Patient Name: Alicia [name], Alicia [name] MRN: [id number] Date of Birth: 09-19-1941 Referring Physician: Nicholas Lose, M.D. Date of Service: 2022-10-30 Radiation Oncologist: Teryl Lucy, M.D. [place]                             RADIATION ONCOLOGY END OF TREATMENT NOTE     Diagnosis: D05.12 Intraductal carcinoma in situ of left breast Staging on 2022-07-02: Ductal carcinoma in situ (DCIS) of left breast T=cTis (DCIS), N=cN0, M=cM0 Intent: Curative     ==========DELIVERED PLANS==========  First Treatment Date: 2022-09-17 - Last Treatment Date: 2022-10-14   Plan Name: Breast_L_BH Site: Breast, Left Technique: 3D Mode: Photon Dose Per Fraction: 2.67 Gy Prescribed Dose (Delivered / Prescribed): 42.72 Gy / 42.72 Gy Prescribed Fxs (Delivered / Prescribed): 16 / 16     ==========ON TREATMENT VISIT DATES========== 2022-09-23, 2022-09-30, 2022-10-07, 2022-10-14     ==========UPCOMING VISITS==========       ==========APPENDIX - ON TREATMENT VISIT NOTES==========   See weekly On Treatment Notes is Epic for details.

## 2022-11-19 ENCOUNTER — Encounter: Payer: Self-pay | Admitting: Radiation Oncology

## 2022-11-19 NOTE — Progress Notes (Signed)
Radiation Oncology         [phone] ________________________________  Name: Alicia [name] MRN: [id number]  Date: 11/20/2022  DOB: 1941/04/10  Follow-Up Visit Note  CC: Gaynelle Arabian, MD  Nicholas Lose, MD  No diagnosis found.  Diagnosis: Stage 0 (cTis (DCIS), cN0, cM0) Left Breast, Intermediate to high-grade DCIS, ER+ / PR+ / Her2 not assessed, s/p lumpectomy     Interval Since Last Radiation: 1 month and 6 days   Intent: Curative  Radiation Treatment Dates: 09/17/2022 through 10/14/2022 Site Technique Total Dose (Gy) Dose per Fx (Gy) Completed Fx Beam Energies  Breast, Left: Breast_L 3D 42.72/42.72 2.67 16/16 6XFFF   Narrative:  The patient returns today for routine follow-up. The patient tolerated radiation therapy relatively well. On the date of her final treatment, the patient endorsed pain to the left breast which radiated to her left back, fatigue, and skin irritation. She denied any SOB. Physical exam performed on the date of her final treatment showed some hyperpigmentation changes and erythema to the left breast area. No skin breakdown was appreciated.                   During her most recent follow-up visit with Dr. Lindi Adie on 10/10/22, the patient consented to proceeding with antiestrogen therapy consisting of tamoxifen x 5 years. She was also noted to deny any concerns other than some persistent radiation dermatitis.       Otherwise, no significant interval history since the patient completed XRT.  ***    Allergies:  is allergic to ibuprofen, lexapro [escitalopram oxalate], lisinopril, mobic [meloxicam], nabumetone, prilosec otc [omeprazole magnesium], zoloft [sertraline hcl], and sulfa antibiotics.  Meds: Current Outpatient Medications  Medication Sig Dispense Refill   acetaminophen (TYLENOL) 650 MG CR tablet Take 1,300 mg by mouth every 8 (eight) hours as needed for pain.     amLODipine (NORVASC) 2.5 MG tablet Take 2.5 mg by mouth daily.     AREXVY 120  MCG/0.5ML injection Inject 0.5 mLs into the muscle once.     benzonatate (TESSALON) 200 MG capsule Take 200 mg by mouth 3 (three) times daily as needed.     Bioflavonoid Products (BIOFLEX PO) Take 1 tablet by mouth daily.     celecoxib (CELEBREX) 200 MG capsule Take 200 mg by mouth daily.     Flax Oil-Fish Oil-Borage Oil (FISH-FLAX-BORAGE PO) Take 1 capsule by mouth daily.     fluticasone (FLONASE) 50 MCG/ACT nasal spray Place 1 spray into both nostrils daily. (Patient taking differently: Place 1 spray into both nostrils as needed.) 16 g 0   irbesartan (AVAPRO) 300 MG tablet Take 300 mg by mouth daily.     isosorbide mononitrate (IMDUR) 30 MG 24 hr tablet Take 1/2 (one-half) tablet by mouth once daily 45 tablet 2   LORazepam (ATIVAN) 0.5 MG tablet Take 0.5 mg by mouth 2 (two) times daily as needed for anxiety.     meclizine (ANTIVERT) 25 MG tablet Take 25 mg by mouth 3 (three) times daily as needed for dizziness.     metoprolol succinate (TOPROL-XL) 50 MG 24 hr tablet Take 50 mg by mouth daily.     Multiple Minerals-Vitamins (CAL MAG ZINC +D3 PO) Take 1 tablet by mouth daily.     multivitamin-lutein (OCUVITE-LUTEIN) CAPS Take 830 capsules by mouth daily.     omeprazole (PRILOSEC) 40 MG capsule Take 40 mg by mouth daily.     PAXLOVID, 150/100, 10 x 150 MG & 10 x 100MG TBPK  Take 2 tablets by mouth 2 (two) times daily.     rOPINIRole (REQUIP) 1 MG tablet Take 1 tablet (1 mg total) by mouth 3 (three) times daily. (Patient taking differently: Take 1-2 mg by mouth See admin instructions. Take 1 tablet (1 mg) by mouth in the morning & take 2 tablets (2 mg) by mouth at night.) 270 tablet 3   simvastatin (ZOCOR) 20 MG tablet Take 20 mg by mouth every evening.     spironolactone (ALDACTONE) 25 MG tablet Take 25 mg by mouth daily.     tamoxifen (NOLVADEX) 10 MG tablet Take 1 tablet (10 mg total) by mouth daily. 90 tablet 3   traZODone (DESYREL) 50 MG tablet Take 50 mg by mouth at bedtime.      Current  Facility-Administered Medications  Medication Dose Route Frequency Provider Last Rate Last Admin   sodium chloride flush (NS) 0.9 % injection 3 mL  3 mL Intravenous Q12H Lorretta Harp, MD        Physical Findings: The patient is in no acute distress. Patient is alert and oriented.  vitals were not taken for this visit. .  No significant changes. Lungs are clear to auscultation bilaterally. Heart has regular rate and rhythm. No palpable cervical, supraclavicular, or axillary adenopathy. Abdomen soft, non-tender, normal bowel sounds.  Right Breast: no palpable mass, nipple discharge or bleeding. Left Breast: ***  Lab Findings: Lab Results  Component Value Date   WBC 8.9 07/02/2022   HGB 13.3 07/02/2022   HCT 39.6 07/02/2022   MCV 90.2 07/02/2022   PLT 300 07/02/2022    Radiographic Findings: No results found.  Impression:   Stage 0 (cTis (DCIS), cN0, cM0) Left Breast, Intermediate to high-grade DCIS, ER+ / PR+ / Her2 not assessed, s/p lumpectomy     The patient is recovering from the effects of radiation.  ***  Plan:  ***   *** minutes of total time was spent for this patient encounter, including preparation, face-to-face counseling with the patient and coordination of care, physical exam, and documentation of the encounter. ____________________________________  Blair Promise, PhD, MD  This document serves as a record of services personally performed by Gery Pray, MD. It was created on his behalf by Roney Mans, a trained medical scribe. The creation of this record is based on the scribe's personal observations and the provider's statements to them. This document has been checked and approved by the attending provider.

## 2022-11-19 NOTE — Progress Notes (Signed)
  Radiation Oncology         [phone] ________________________________  Patient Name: Alicia [name] MRN: [id number] DOB: 06/17/1941 Referring Physician: Nicholas Lose (Profile Not Attached) Date of Service: 10/14/2022  [facility] [place]                                                        End Of Treatment Note  Diagnoses: D05.12-Intraductal carcinoma in situ of left breast  Cancer Staging: Stage 0 (cTis (DCIS), cN0, cM0) Left Breast, Intermediate to high-grade DCIS, ER+ / PR+ / Her2 not assessed, s/p lumpectomy    Intent: Curative  Radiation Treatment Dates: 09/17/2022 through 10/14/2022 Site Technique Total Dose (Gy) Dose per Fx (Gy) Completed Fx Beam Energies  Breast, Left: Breast_L 3D 42.72/42.72 2.67 16/16 6XFFF   Narrative: The patient tolerated radiation therapy relatively well. On the date of her final treatment, the patient endorsed pain to the left breast which radiated to her left back, fatigue, and skin irritation. She denied any SOB. Physical exam performed on the date of her final treatment showed some hyperpigmentation changes and erythema to the left breast area. No skin breakdown was appreciated.  Plan: The patient will follow-up with radiation oncology in one month .  ________________________________________________ -----------------------------------  Blair Promise, PhD, MD  This document serves as a record of services personally performed by Gery Pray, MD. It was created on his behalf by Roney Mans, a trained medical scribe. The creation of this record is based on the scribe's personal observations and the provider's statements to them. This document has been checked and approved by the attending provider.

## 2022-11-20 ENCOUNTER — Encounter: Payer: Self-pay | Admitting: Radiation Oncology

## 2022-11-20 ENCOUNTER — Ambulatory Visit
Admission: RE | Admit: 2022-11-20 | Discharge: 2022-11-20 | Disposition: A | Discharge status: Home / Self Care | Payer: Medicare HMO | Source: Ambulatory Visit | Attending: Radiation Oncology | Admitting: Radiation Oncology

## 2022-11-20 DIAGNOSIS — Z923 Personal history of irradiation: Secondary | ICD-10-CM | POA: Insufficient documentation

## 2022-11-20 DIAGNOSIS — D0512 Intraductal carcinoma in situ of left breast: Secondary | ICD-10-CM

## 2022-11-20 DIAGNOSIS — Z79899 Other long term (current) drug therapy: Secondary | ICD-10-CM | POA: Diagnosis not present

## 2022-11-20 DIAGNOSIS — Z170 Estrogen receptor positive status [ER+]: Secondary | ICD-10-CM | POA: Insufficient documentation

## 2022-11-20 DIAGNOSIS — Z791 Long term (current) use of non-steroidal anti-inflammatories (NSAID): Secondary | ICD-10-CM | POA: Diagnosis not present

## 2022-11-20 DIAGNOSIS — L598 Other specified disorders of the skin and subcutaneous tissue related to radiation: Secondary | ICD-10-CM | POA: Diagnosis not present

## 2022-11-20 DIAGNOSIS — Z79810 Long term (current) use of selective estrogen receptor modulators (SERMs): Secondary | ICD-10-CM | POA: Diagnosis not present

## 2022-11-20 HISTORY — DX: Personal history of irradiation: Z92.3

## 2022-11-20 NOTE — Progress Notes (Signed)
Alicia [name] is here today for follow up post radiation to the breast.   Breast Side:left   They completed their radiation on: 10/14/22  Does the patient complain of any of the following: Post radiation skin issues:  No Breast Tenderness: Yes Breast Swelling: No Lymphadema: No Range of Motion limitations: No Fatigue post radiation: Yes, but improving.  Appetite good/fair/poor: Good  Additional comments if applicable: Patient reports having visual problems since starting tamoxifen.   BP 126/80 (BP Location: Right Arm, Patient Position: Sitting, Cuff Size: Normal)   Pulse 71   Temp 97.9 F (36.6 C)   Resp 20   Ht 5' 1.2" (1.554 m)   Wt 155 lb 9.6 oz (70.6 kg)   SpO2 98%   BMI 29.21 kg/m

## 2022-12-31 ENCOUNTER — Other Ambulatory Visit: Payer: Self-pay | Admitting: Cardiovascular Disease

## 2022-12-31 DIAGNOSIS — R079 Chest pain, unspecified: Secondary | ICD-10-CM

## 2022-12-31 DIAGNOSIS — E785 Hyperlipidemia, unspecified: Secondary | ICD-10-CM

## 2023-01-12 ENCOUNTER — Inpatient Hospital Stay: Payer: Medicare HMO

## 2023-01-12 ENCOUNTER — Inpatient Hospital Stay: Payer: Medicare HMO | Attending: Hematology and Oncology | Admitting: Adult Health

## 2023-01-12 ENCOUNTER — Encounter: Payer: Self-pay | Admitting: Adult Health

## 2023-01-12 VITALS — BP 74/48 | HR 72 | Temp 97.9°F | Resp 18 | Ht 61.0 in | Wt 153.8 lb

## 2023-01-12 DIAGNOSIS — D0512 Intraductal carcinoma in situ of left breast: Secondary | ICD-10-CM | POA: Diagnosis not present

## 2023-01-12 DIAGNOSIS — Z923 Personal history of irradiation: Secondary | ICD-10-CM | POA: Diagnosis not present

## 2023-01-12 DIAGNOSIS — Z79810 Long term (current) use of selective estrogen receptor modulators (SERMs): Secondary | ICD-10-CM | POA: Insufficient documentation

## 2023-01-12 DIAGNOSIS — E860 Dehydration: Secondary | ICD-10-CM | POA: Insufficient documentation

## 2023-01-12 DIAGNOSIS — I951 Orthostatic hypotension: Secondary | ICD-10-CM

## 2023-01-12 DIAGNOSIS — K5900 Constipation, unspecified: Secondary | ICD-10-CM | POA: Insufficient documentation

## 2023-01-12 DIAGNOSIS — Z87891 Personal history of nicotine dependence: Secondary | ICD-10-CM | POA: Insufficient documentation

## 2023-01-12 DIAGNOSIS — I10 Essential (primary) hypertension: Secondary | ICD-10-CM | POA: Diagnosis not present

## 2023-01-12 DIAGNOSIS — Z79899 Other long term (current) drug therapy: Secondary | ICD-10-CM | POA: Diagnosis not present

## 2023-01-12 LAB — CBC WITH DIFFERENTIAL (CANCER CENTER ONLY)
Abs Immature Granulocytes: 0.02 10*3/uL (ref 0.00–0.07)
Basophils Absolute: 0 10*3/uL (ref 0.0–0.1)
Basophils Relative: 1 %
Eosinophils Absolute: 0.2 10*3/uL (ref 0.0–0.5)
Eosinophils Relative: 2 %
HCT: 37.4 % (ref 36.0–46.0)
Hemoglobin: 12.5 g/dL (ref 12.0–15.0)
Immature Granulocytes: 0 %
Lymphocytes Relative: 30 %
Lymphs Abs: 2.4 10*3/uL (ref 0.7–4.0)
MCH: 30.3 pg (ref 26.0–34.0)
MCHC: 33.4 g/dL (ref 30.0–36.0)
MCV: 90.6 fL (ref 80.0–100.0)
Monocytes Absolute: 0.7 10*3/uL (ref 0.1–1.0)
Monocytes Relative: 8 %
Neutro Abs: 4.7 10*3/uL (ref 1.7–7.7)
Neutrophils Relative %: 59 %
Platelet Count: 286 10*3/uL (ref 150–400)
RBC: 4.13 MIL/uL (ref 3.87–5.11)
RDW: 13.1 % (ref 11.5–15.5)
WBC Count: 8 10*3/uL (ref 4.0–10.5)
nRBC: 0 % (ref 0.0–0.2)

## 2023-01-12 LAB — CMP (CANCER CENTER ONLY)
ALT: 10 U/L (ref 0–44)
AST: 15 U/L (ref 15–41)
Albumin: 3.9 g/dL (ref 3.5–5.0)
Alkaline Phosphatase: 53 U/L (ref 38–126)
Anion gap: 6 (ref 5–15)
BUN: 36 mg/dL — ABNORMAL HIGH (ref 8–23)
CO2: 27 mmol/L (ref 22–32)
Calcium: 10.4 mg/dL — ABNORMAL HIGH (ref 8.9–10.3)
Chloride: 105 mmol/L (ref 98–111)
Creatinine: 1.46 mg/dL — ABNORMAL HIGH (ref 0.44–1.00)
GFR, Estimated: 36 mL/min — ABNORMAL LOW (ref >60)
Glucose, Bld: 100 mg/dL — ABNORMAL HIGH (ref 70–99)
Potassium: 4.6 mmol/L (ref 3.5–5.1)
Sodium: 138 mmol/L (ref 135–145)
Total Bilirubin: 0.7 mg/dL (ref 0.3–1.2)
Total Protein: 6.9 g/dL (ref 6.5–8.1)

## 2023-01-12 LAB — MAGNESIUM: Magnesium: 2 mg/dL (ref 1.7–2.4)

## 2023-01-12 LAB — D-DIMER, QUANTITATIVE: D-Dimer, Quant: 1.09 ug/mL-FEU — ABNORMAL HIGH (ref 0.00–0.50)

## 2023-01-12 MED ORDER — SODIUM CHLORIDE 0.9 % IV SOLN: INTRAVENOUS | Status: AC

## 2023-01-12 NOTE — Progress Notes (Signed)
SURVIVORSHIP VISIT:   BRIEF ONCOLOGIC HISTORY:  Oncology History  Ductal carcinoma in situ (DCIS) of left breast  06/19/2022 Initial Diagnosis   Screening detected distortion with calcifications in the left breast central 12 o'clock position measuring 1.9 cm.  No ultrasound correlate.  Axilla negative.  Stereotactic biopsy revealed intermediate grade DCIS cribriform and papillary features ER 100%, PR 60%   07/02/2022 Cancer Staging   Staging form: Breast, AJCC 8th Edition - Clinical: Stage 0 (cTis (DCIS), cN0, cM0, G2, ER+, PR+) - Signed by [name], MD on 07/02/2022 Stage prefix: Initial diagnosis Histologic grading system: 3 grade system    Genetic Testing   [facility] CustomNext Panel was Negative. Report date is 07/11/2022.  The CustomNext-Cancer+RNAinsight panel offered by [employer] [facility] includes sequencing and rearrangement analysis for the following 47 genes:  APC, ATM, AXIN2, BARD1, BMPR1A, BRCA1, BRCA2, BRIP1, CDH1, CDK4, CDKN2A, CHEK2, CTNNA1, DICER1, EPCAM, GREM1, HOXB13, KIT, MEN1, MLH1, MSH2, MSH3, MSH6, MUTYH, NBN, NF1, NTHL1, PALB2, PDGFRA, PMS2, POLD1, POLE, PTEN, RAD50, RAD51C, RAD51D, SDHA, SDHB, SDHC, SDHD, SMAD4, SMARCA4, STK11, TP53, TSC1, TSC2, and VHL.  RNA data is routinely analyzed for use in variant interpretation for all genes.   07/16/2022 Surgery   Left lumpectomy: DCIS cribriform and papillary types with focal necrosis grade 3 involving a complex sclerosing lesion, margins focally positive with superior/posterior margin ER 100%, PR 60%   08/13/2022 Surgery   Reexcision of the margins: Benign   09/17/2022 - 10/14/2022 Radiation Therapy   Site Technique Total Dose (Gy) Dose per Fx (Gy) Completed Fx Beam Energies  Breast, Left: Breast_L 3D 42.72/42.72 2.67 16/16 6XFFF     10/2022 -  Anti-estrogen oral therapy   Tamoxifen x 5 years     INTERVAL HISTORY:  Alicia [name] to review her survivorship care plan detailing her treatment course for breast cancer, as well as  monitoring long-term side effects of that treatment, education regarding health maintenance, screening, and overall wellness and health promotion.     Overall, Alicia [name] reports feeling moderately well.  She is taking Tamoxifen daily.  She has been experiencing dizziness with position changes, fatigue, and shortness of breath.  She denies any swelling in either extremity.  She notes increased constipation and is having a bm once per week.  She drinks one 16 ounce bottle of water per day.  The other fluid she drinks includes coffee daily along with soda daily.  She notes these issues have been going on chronically, however have worsened over the past 3 months.  Today, her orthostatic vital signs were positive.    She has a h/o essential hypertension, however denies any other cardiac history in particular heart failure.   REVIEW OF SYSTEMS:  Review of Systems  Constitutional:  Positive for fatigue. Negative for appetite change, chills, fever and unexpected weight change.  HENT:   Negative for hearing loss, lump/mass and trouble swallowing.   Eyes:  Negative for eye problems and icterus.  Respiratory:  Positive for shortness of breath. Negative for chest tightness and cough.   Cardiovascular:  Negative for chest pain, leg swelling and palpitations.  Gastrointestinal:  Negative for abdominal distention, abdominal pain, constipation, diarrhea, nausea and vomiting.  Endocrine: Negative for hot flashes.  Genitourinary:  Negative for difficulty urinating.   Musculoskeletal:  Negative for arthralgias.  Skin:  Negative for itching and rash.  Neurological:  Positive for dizziness. Negative for extremity weakness, headaches and numbness.  Hematological:  Negative for adenopathy. Does not bruise/bleed easily.  Psychiatric/Behavioral:  Negative  for depression. The patient is not nervous/anxious.   Breast: Denies any new nodularity, masses, tenderness, nipple changes, or nipple discharge.         PAST  MEDICAL/SURGICAL HISTORY:  Past Medical History:  Diagnosis Date   Anginal pain (HCC)    Anxiety 2014   Bursitis    Cancer (HCC) 06/2022   left breast DCIS   Dyspnea    with exertion   GERD (gastroesophageal reflux disease) 2008   History of radiation therapy    Left breast- 09/17/22-10/14/22- Dr. Antony Blackbird   Hypercholesteremia 2003   Hypertension 2003   Insomnia 1988   Kidney stone    Osteoarthritis 2008   hands, knees, right hip   Osteopenia 2002   Restless leg syndrome 2000   RLS (restless legs syndrome) 03/28/2016   Vertigo 2002   Past Surgical History:  Procedure Laterality Date   ABDOMINAL HYSTERECTOMY     APPENDECTOMY     BLEPHAROPLASTY  2019   BREAST LUMPECTOMY WITH RADIOACTIVE SEED LOCALIZATION Left 07/16/2022   Procedure: LEFT BREAST LUMPECTOMY WITH RADIOACTIVE SEED LOCALIZATION;  Surgeon: Abigail Miyamoto, MD;  Location: [facility];  Service: General;  Laterality: Left;   CATARACT EXTRACTION Right 02/2010   CHOLECYSTECTOMY     COLONOSCOPY  2015   results normal   LEFT HEART CATH AND CORONARY ANGIOGRAPHY N/A 09/20/2020   Procedure: LEFT HEART CATH AND CORONARY ANGIOGRAPHY;  Surgeon: Runell Gess, MD;  Location: [facility];  Service: Cardiovascular;  Laterality: N/A;   RE-EXCISION OF BREAST LUMPECTOMY Left 08/13/2022   Procedure: RE-EXCISION OF LEFT BREAST CANCER;  Surgeon: Abigail Miyamoto, MD;  Location: [facility];  Service: General;  Laterality: Left;   ROTATOR CUFF REPAIR Right      ALLERGIES:  Allergies  Allergen Reactions   Ibuprofen Other (See Comments)    GI UPSET   Lexapro [Escitalopram Oxalate] Other (See Comments)    GERD   Lisinopril Cough   Mobic [Meloxicam] Other (See Comments)    LEG PAIN   Nabumetone Other (See Comments)    GI UPSET   Prilosec Otc [Omeprazole Magnesium] Other (See Comments)    ARM SPASM   Zoloft [Sertraline Hcl] Other (See Comments)    INSOMNIA   Sulfa Antibiotics Rash      CURRENT MEDICATIONS:  Outpatient Encounter Medications as of 01/12/2023  Medication Sig   acetaminophen (TYLENOL) 650 MG CR tablet Take 1,300 mg by mouth every 8 (eight) hours as needed for pain.   amLODipine (NORVASC) 2.5 MG tablet Take 2.5 mg by mouth daily.   AREXVY 120 MCG/0.5ML injection Inject 0.5 mLs into the muscle once. (Patient not taking: Reported on 11/20/2022)   benzonatate (TESSALON) 200 MG capsule Take 200 mg by mouth 3 (three) times daily as needed. (Patient not taking: Reported on 11/20/2022)   Bioflavonoid Products (BIOFLEX PO) Take 1 tablet by mouth daily.   celecoxib (CELEBREX) 200 MG capsule Take 200 mg by mouth daily.   Flax Oil-Fish Oil-Borage Oil (FISH-FLAX-BORAGE PO) Take 1 capsule by mouth daily.   fluticasone (FLONASE) 50 MCG/ACT nasal spray Place 1 spray into both nostrils daily. (Patient taking differently: Place 1 spray into both nostrils as needed.)   irbesartan (AVAPRO) 300 MG tablet Take 300 mg by mouth daily.   isosorbide mononitrate (IMDUR) 30 MG 24 hr tablet Take 1/2 (one-half) tablet by mouth once daily   LORazepam (ATIVAN) 0.5 MG tablet Take 0.5 mg by mouth 2 (two) times daily as needed  for anxiety.   meclizine (ANTIVERT) 25 MG tablet Take 25 mg by mouth 3 (three) times daily as needed for dizziness.   metoprolol succinate (TOPROL-XL) 50 MG 24 hr tablet Take 50 mg by mouth daily.   Multiple Minerals-Vitamins (CAL MAG ZINC +D3 PO) Take 1 tablet by mouth daily.   multivitamin-lutein (OCUVITE-LUTEIN) CAPS Take 830 capsules by mouth daily.   omeprazole (PRILOSEC) 40 MG capsule Take 40 mg by mouth daily.   rOPINIRole (REQUIP) 1 MG tablet Take 1 tablet (1 mg total) by mouth 3 (three) times daily. (Patient taking differently: Take 1-2 mg by mouth See admin instructions. Take 1 tablet (1 mg) by mouth in the morning & take 2 tablets (2 mg) by mouth at night.)   simvastatin (ZOCOR) 20 MG tablet Take 20 mg by mouth every evening.   spironolactone (ALDACTONE) 25 MG  tablet Take 25 mg by mouth daily.   tamoxifen (NOLVADEX) 10 MG tablet Take 1 tablet (10 mg total) by mouth daily.   traZODone (DESYREL) 50 MG tablet Take 50 mg by mouth at bedtime.    Facility-Administered Encounter Medications as of 01/12/2023  Medication   sodium chloride flush (NS) 0.9 % injection 3 mL     ONCOLOGIC FAMILY HISTORY:  Family History  Problem Relation Age of Onset   Heart attack Mother    Depression Mother    Stroke Mother    Diabetes Mother    Heart attack Father    COPD Sister    COPD Sister    COPD Brother    COPD Brother    Restless legs syndrome Daughter    Restless legs syndrome Son    Restless legs syndrome Son    Breast cancer Maternal Aunt 44     SOCIAL HISTORY:  Social History   Socioeconomic History   Marital status: Widowed    Spouse name: Not on file   Number of children: Not on file   Years of education: Not on file   Highest education level: Not on file  Occupational History   Not on file  Tobacco Use   Smoking status: Former    Packs/day: 0.50    Years: 20.00    Additional pack years: 0.00    Total pack years: 10.00    Types: Cigarettes    Quit date: 10/06/1996    Years since quitting: 26.2   Smokeless tobacco: Never  Vaping Use   Vaping Use: Never used  Substance and Sexual Activity   Alcohol use: No    Alcohol/week: 0.0 standard drinks of alcohol   Drug use: No   Sexual activity: Not Currently    Birth control/protection: Surgical    Comment: hyst  Other Topics Concern   Not on file  Social History Narrative   Lives alone in a one story home.  Has 3 children.     Semi retired.     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   Social Determinants of Health   Financial Resource Strain: Not on file  Food Insecurity: Not on file  Transportation Needs: Not on file  Physical Activity: Not on file  Stress: Not on file  Social Connections: Not on file  Intimate Partner Violence: Not on file      OBSERVATIONS/OBJECTIVE:  BP (!) 74/48 (BP Location: Left Arm, Patient Position: Standing)   Pulse 72   Temp 97.9 F (36.6 C) (Temporal)   Resp 18   Ht  (1.549 m)   Wt  153 lb 12.8 oz (69.8 kg)   SpO2 95%   BMI 29.06 kg/m  GENERAL: Patient is a well appearing female in no acute distress HEENT:  Sclerae anicteric.  Oropharynx clear and moist. No ulcerations or evidence of oropharyngeal candidiasis. Neck is supple.  NODES:  No cervical, supraclavicular, or axillary lymphadenopathy palpated.  BREAST EXAM: Status post lumpectomy and radiation no sign of local recurrence right breast is benign. LUNGS:  Clear to auscultation bilaterally.  No wheezes or rhonchi. HEART:  Regular rate and rhythm. No murmur appreciated. ABDOMEN:  Soft, nontender.  Positive, normoactive bowel sounds. No organomegaly palpated. MSK:  No focal spinal tenderness to palpation. Full range of motion bilaterally in the upper extremities. EXTREMITIES:  No peripheral edema.   SKIN:  Clear with no obvious rashes or skin changes. No nail dyscrasia. NEURO:  Nonfocal. Well oriented.  Appropriate affect.   LABORATORY DATA:  None for this visit.  DIAGNOSTIC IMAGING:  None for this visit.      ASSESSMENT AND PLAN:  Ms.. [name] is a pleasant 82 y.o. female with Stage 0 left breast DCIS, ER+/PR+, diagnosed in 06/2022, treated with lumpectomy, adjuvant radiation therapy, and anti-estrogen therapy with Tamoxifen beginning in 10/2022.  She presents to the Survivorship Clinic for our initial meeting and routine follow-up post-completion of treatment for breast cancer.    1. Stage 0 left breast cancer:  Alicia [name] is continuing to recover from definitive treatment for breast cancer. She will follow-up with her medical oncologist, Dr. Pamelia Hoit in 6 months with history and physical exam per surveillance protocol.  She will continue her anti-estrogen therapy with Tamoxifen--she is taking the lower dose and is tolerating it well..  Thus far, she is tolerating the Tamoxifen well, with minimal side effects. She was instructed to make Dr. Pamelia Hoit or myself aware if she begins to experience any worsening side effects of the medication and I could see her back in clinic to help manage those side effects, as needed. Her mammogram is due 05/2023; orders placed today. Today, a comprehensive survivorship care plan and treatment summary was reviewed with the patient today detailing her breast cancer diagnosis, treatment course, potential late/long-term effects of treatment, appropriate follow-up care with recommendations for the future, and patient education resources.  A copy of this summary, along with a letter will be sent to the patient's primary care provider via mail/fax/In Basket message after today's visit.    2.  Dehydration: She has symptomatic orthostatic hypotension.  She is on several different antihypertensives and is not drinking a normal amount of water per day.  We will obtain labs today and we gave her a handout on dehydration and we will give her 1 L of IV fluids.  I recommended that she follow-up with her PCP as soon as possible to get back and to discuss her medications and further follow-up of her blood pressure, hydration issues.  If her shortness of breath does not improve after receiving the IV fluids we will consider ordering CT angiogram.  3. Bone health:   She was given education on specific activities to promote bone health.  4. Cancer screening:  Due to Ms. Lalley's history and her age, she should receive screening for skin cancers.  The information and recommendations are listed on the patient's comprehensive care plan/treatment summary and were reviewed in detail with the patient.    5. Health maintenance and wellness promotion: Ms. Steinhardt was encouraged to consume 5-7 servings of fruits and vegetables per day. We reviewed  the "Nutrition Rainbow" handout.  She was also encouraged to engage in moderate to vigorous  exercise for 30 minutes per day most days of the week.  She was instructed to limit her alcohol consumption and continue to abstain from tobacco use.     6. Support services/counseling: It is not uncommon for this period of the patient's cancer care trajectory to be one of many emotions and stressors.  She was given information regarding our available services and encouraged to contact me with any questions or for help enrolling in any of our support group/programs.    Follow up instructions:    -Return to cancer center in 6 months for f/u with Dr. Pamelia HoitGudena   -Mammogram due in 05/2023 -See your primary care provider to discuss antihypertensives this week. -She is welcome to return back to the Survivorship Clinic at any time; no additional follow-up needed at this time.  -Consider referral back to survivorship as a long-term survivor for continued surveillance  The patient was provided an opportunity to ask questions and all were answered. The patient agreed with the plan and demonstrated an understanding of the instructions.   Total encounter time:40 minutes*in face-to-face visit time, chart review, lab review, care coordination, order entry, and documentation of the encounter time.  Lillard AnesLindsey Celeste Tavenner, NP 01/12/23 1:22 PM Medical Oncology and Hematology [facility] [address] , [place] [address] Tel. [phone]    Fax. [phone]  *Total Encounter Time as defined by the Centers for Medicare and Medicaid Services includes, in addition to the face-to-face time of a patient visit (documented in the note above) non-face-to-face time: obtaining and reviewing outside history, ordering and reviewing medications, tests or procedures, care coordination (communications with other health care professionals or caregivers) and documentation in the medical record.

## 2023-01-12 NOTE — Patient Instructions (Signed)

## 2023-01-12 NOTE — Patient Instructions (Signed)

## 2023-01-13 ENCOUNTER — Telehealth: Payer: Self-pay | Admitting: Adult Health

## 2023-01-13 NOTE — Telephone Encounter (Signed)
Scheduled appointment per 4/8 los. Patient is aware of the made appointment. 

## 2023-01-14 ENCOUNTER — Other Ambulatory Visit: Payer: Self-pay | Admitting: *Deleted

## 2023-01-14 ENCOUNTER — Other Ambulatory Visit: Payer: Self-pay

## 2023-01-14 ENCOUNTER — Other Ambulatory Visit: Payer: Self-pay | Admitting: Adult Health

## 2023-01-14 ENCOUNTER — Inpatient Hospital Stay: Payer: Medicare HMO

## 2023-01-14 DIAGNOSIS — R7989 Other specified abnormal findings of blood chemistry: Secondary | ICD-10-CM

## 2023-01-14 DIAGNOSIS — E860 Dehydration: Secondary | ICD-10-CM | POA: Diagnosis not present

## 2023-01-14 DIAGNOSIS — Z79899 Other long term (current) drug therapy: Secondary | ICD-10-CM | POA: Diagnosis not present

## 2023-01-14 DIAGNOSIS — D0512 Intraductal carcinoma in situ of left breast: Secondary | ICD-10-CM

## 2023-01-14 DIAGNOSIS — I10 Essential (primary) hypertension: Secondary | ICD-10-CM | POA: Diagnosis not present

## 2023-01-14 DIAGNOSIS — K5900 Constipation, unspecified: Secondary | ICD-10-CM | POA: Diagnosis not present

## 2023-01-14 DIAGNOSIS — Z87891 Personal history of nicotine dependence: Secondary | ICD-10-CM | POA: Diagnosis not present

## 2023-01-14 DIAGNOSIS — Z923 Personal history of irradiation: Secondary | ICD-10-CM | POA: Diagnosis not present

## 2023-01-14 DIAGNOSIS — Z79810 Long term (current) use of selective estrogen receptor modulators (SERMs): Secondary | ICD-10-CM | POA: Diagnosis not present

## 2023-01-14 DIAGNOSIS — R0609 Other forms of dyspnea: Secondary | ICD-10-CM

## 2023-01-14 LAB — COMPREHENSIVE METABOLIC PANEL
ALT: 14 U/L (ref 0–44)
AST: 17 U/L (ref 15–41)
Albumin: 3.6 g/dL (ref 3.5–5.0)
Alkaline Phosphatase: 57 U/L (ref 38–126)
Anion gap: 5 (ref 5–15)
BUN: 21 mg/dL (ref 8–23)
CO2: 25 mmol/L (ref 22–32)
Calcium: 9.3 mg/dL (ref 8.9–10.3)
Chloride: 105 mmol/L (ref 98–111)
Creatinine, Ser: 0.98 mg/dL (ref 0.44–1.00)
GFR, Estimated: 59 mL/min — ABNORMAL LOW (ref >60)
Glucose, Bld: 97 mg/dL (ref 70–99)
Potassium: 4 mmol/L (ref 3.5–5.1)
Sodium: 135 mmol/L (ref 135–145)
Total Bilirubin: 0.4 mg/dL (ref 0.3–1.2)
Total Protein: 7 g/dL (ref 6.5–8.1)

## 2023-01-14 NOTE — Progress Notes (Signed)
Orders placed for labs per Lillard Anes, NP

## 2023-01-15 ENCOUNTER — Ambulatory Visit (HOSPITAL_COMMUNITY)
Admission: RE | Admit: 2023-01-15 | Discharge: 2023-01-15 | Disposition: A | Discharge status: Home / Self Care | Payer: Medicare HMO | Source: Ambulatory Visit | Attending: Adult Health | Admitting: Adult Health

## 2023-01-15 DIAGNOSIS — D0512 Intraductal carcinoma in situ of left breast: Secondary | ICD-10-CM | POA: Diagnosis not present

## 2023-01-15 DIAGNOSIS — R079 Chest pain, unspecified: Secondary | ICD-10-CM | POA: Diagnosis not present

## 2023-01-15 DIAGNOSIS — R0609 Other forms of dyspnea: Secondary | ICD-10-CM | POA: Insufficient documentation

## 2023-01-15 DIAGNOSIS — R7989 Other specified abnormal findings of blood chemistry: Secondary | ICD-10-CM | POA: Diagnosis not present

## 2023-01-15 DIAGNOSIS — R0602 Shortness of breath: Secondary | ICD-10-CM | POA: Diagnosis not present

## 2023-01-15 MED ORDER — IOHEXOL 350 MG/ML SOLN
60.0000 mL | Freq: Once | INTRAVENOUS | Status: AC | PRN
  Administered 2023-01-15: 60 mL via INTRAVENOUS

## 2023-01-16 ENCOUNTER — Telehealth: Payer: Self-pay

## 2023-01-16 NOTE — Telephone Encounter (Signed)
Called and given below message, Pt verbalized understanding.  

## 2023-01-16 NOTE — Telephone Encounter (Signed)
-----   Message from Loa Socks, NP sent at 01/15/2023  8:43 AM EDT ----- CT angiogram looks good, no pulmonary embolus or reason identified for her shortness of breath.   ----- Message ----- From: Interface, Rad Results In Sent: 01/15/2023   8:34 AM EDT To: Loa Socks, NP

## 2023-01-19 DIAGNOSIS — J309 Allergic rhinitis, unspecified: Secondary | ICD-10-CM | POA: Diagnosis not present

## 2023-01-19 DIAGNOSIS — I10 Essential (primary) hypertension: Secondary | ICD-10-CM | POA: Diagnosis not present

## 2023-01-19 DIAGNOSIS — M1990 Unspecified osteoarthritis, unspecified site: Secondary | ICD-10-CM | POA: Diagnosis not present

## 2023-01-19 DIAGNOSIS — R42 Dizziness and giddiness: Secondary | ICD-10-CM | POA: Diagnosis not present

## 2023-01-19 DIAGNOSIS — E785 Hyperlipidemia, unspecified: Secondary | ICD-10-CM | POA: Diagnosis not present

## 2023-01-19 DIAGNOSIS — R32 Unspecified urinary incontinence: Secondary | ICD-10-CM | POA: Diagnosis not present

## 2023-01-19 DIAGNOSIS — M8580 Other specified disorders of bone density and structure, unspecified site: Secondary | ICD-10-CM | POA: Diagnosis not present

## 2023-01-19 DIAGNOSIS — G4700 Insomnia, unspecified: Secondary | ICD-10-CM | POA: Diagnosis not present

## 2023-01-19 DIAGNOSIS — K219 Gastro-esophageal reflux disease without esophagitis: Secondary | ICD-10-CM | POA: Diagnosis not present

## 2023-01-19 DIAGNOSIS — G2581 Restless legs syndrome: Secondary | ICD-10-CM | POA: Diagnosis not present

## 2023-01-19 DIAGNOSIS — R69 Illness, unspecified: Secondary | ICD-10-CM | POA: Diagnosis not present

## 2023-01-19 DIAGNOSIS — Z8249 Family history of ischemic heart disease and other diseases of the circulatory system: Secondary | ICD-10-CM | POA: Diagnosis not present

## 2023-02-12 ENCOUNTER — Other Ambulatory Visit: Payer: Self-pay | Admitting: Cardiovascular Disease

## 2023-02-12 DIAGNOSIS — R079 Chest pain, unspecified: Secondary | ICD-10-CM

## 2023-02-12 DIAGNOSIS — E785 Hyperlipidemia, unspecified: Secondary | ICD-10-CM

## 2023-03-08 ENCOUNTER — Other Ambulatory Visit: Payer: Self-pay | Admitting: Cardiovascular Disease

## 2023-03-08 DIAGNOSIS — R079 Chest pain, unspecified: Secondary | ICD-10-CM

## 2023-03-08 DIAGNOSIS — E785 Hyperlipidemia, unspecified: Secondary | ICD-10-CM

## 2023-03-25 ENCOUNTER — Other Ambulatory Visit: Payer: Self-pay | Admitting: Cardiovascular Disease

## 2023-03-25 DIAGNOSIS — E785 Hyperlipidemia, unspecified: Secondary | ICD-10-CM

## 2023-03-25 DIAGNOSIS — R079 Chest pain, unspecified: Secondary | ICD-10-CM

## 2023-04-02 ENCOUNTER — Other Ambulatory Visit: Payer: Self-pay | Admitting: Cardiovascular Disease

## 2023-04-02 DIAGNOSIS — E785 Hyperlipidemia, unspecified: Secondary | ICD-10-CM

## 2023-04-02 DIAGNOSIS — R079 Chest pain, unspecified: Secondary | ICD-10-CM

## 2023-04-05 NOTE — Progress Notes (Signed)
Patient Care Team: Blair Heys, MD as PCP - General (Family Medicine) Abigail Miyamoto, MD as Consulting Physician (General Surgery) [name], MD as Consulting Physician (Hematology and Oncology) Antony Blackbird, MD as Consulting Physician (Radiation Oncology)  DIAGNOSIS:  Encounter Diagnosis  Name Primary?   Ductal carcinoma in situ (DCIS) of left breast Yes    SUMMARY OF ONCOLOGIC HISTORY: Oncology History  Ductal carcinoma in situ (DCIS) of left breast  06/19/2022 Initial Diagnosis   Screening detected distortion with calcifications in the left breast central 12 o'clock position measuring 1.9 cm.  No ultrasound correlate.  Axilla negative.  Stereotactic biopsy revealed intermediate grade DCIS cribriform and papillary features ER 100%, PR 60%   07/02/2022 Cancer Staging   Staging form: Breast, AJCC 8th Edition - Clinical: Stage 0 (cTis (DCIS), cN0, cM0, G2, ER+, PR+) - Signed by [name], MD on 07/02/2022 Stage prefix: Initial diagnosis Histologic grading system: 3 grade system    Genetic Testing   [facility] CustomNext Panel was Negative. Report date is 07/11/2022.  The CustomNext-Cancer+RNAinsight panel offered by [employer] includes sequencing and rearrangement analysis for the following 47 genes:  APC, ATM, AXIN2, BARD1, BMPR1A, BRCA1, BRCA2, BRIP1, CDH1, CDK4, CDKN2A, CHEK2, CTNNA1, DICER1, EPCAM, GREM1, HOXB13, KIT, MEN1, MLH1, MSH2, MSH3, MSH6, MUTYH, NBN, NF1, NTHL1, PALB2, PDGFRA, PMS2, POLD1, POLE, PTEN, RAD50, RAD51C, RAD51D, SDHA, SDHB, SDHC, SDHD, SMAD4, SMARCA4, STK11, TP53, TSC1, TSC2, and VHL.  RNA data is routinely analyzed for use in variant interpretation for all genes.   07/16/2022 Surgery   Left lumpectomy: DCIS cribriform and papillary types with focal necrosis grade 3 involving a complex sclerosing lesion, margins focally positive with superior/posterior margin ER 100%, PR 60%   08/13/2022 Surgery   Reexcision of the margins: Benign   09/17/2022  - 10/14/2022 Radiation Therapy   Site Technique Total Dose (Gy) Dose per Fx (Gy) Completed Fx Beam Energies  Breast, Left: Breast_L 3D 42.72/42.72 2.67 16/16 6XFFF     10/2022 -  Anti-estrogen oral therapy   Tamoxifen x 5 years     CHIEF COMPLIANT: left breast DCIS on tamoxifen   INTERVAL HISTORY: Alicia [name] is a 82 y.o with the above-mentioned left breast DCIS. She presents to the clinic for a follow-up. Pt reports that she is tolerating the tamoxifen fairly well. She had some shortness of breath from taking the Trazodone. She reports weakness in legs. Walking makes her exhausted.   ALLERGIES:  is allergic to ibuprofen, lexapro [escitalopram oxalate], lisinopril, mobic [meloxicam], nabumetone, prilosec otc [omeprazole magnesium], zoloft [sertraline hcl], and sulfa antibiotics.  MEDICATIONS:  Current Outpatient Medications  Medication Sig Dispense Refill   acetaminophen (TYLENOL) 650 MG CR tablet Take 1,300 mg by mouth every 8 (eight) hours as needed for pain.     amLODipine (NORVASC) 2.5 MG tablet Take 2.5 mg by mouth daily.     Bioflavonoid Products (BIOFLEX PO) Take 1 tablet by mouth daily.     celecoxib (CELEBREX) 200 MG capsule Take 200 mg by mouth daily.     Flax Oil-Fish Oil-Borage Oil (FISH-FLAX-BORAGE PO) Take 1 capsule by mouth daily.     fluticasone (FLONASE) 50 MCG/ACT nasal spray Place 1 spray into both nostrils daily. (Patient taking differently: Place 1 spray into both nostrils as needed.) 16 g 0   irbesartan (AVAPRO) 300 MG tablet Take 300 mg by mouth daily.     isosorbide mononitrate (IMDUR) 30 MG 24 hr tablet Take 1/2 (one-half) tablet by mouth once daily 45 tablet 3  LORazepam (ATIVAN) 0.5 MG tablet Take 0.5 mg by mouth 2 (two) times daily as needed for anxiety.     meclizine (ANTIVERT) 25 MG tablet Take 25 mg by mouth 3 (three) times daily as needed for dizziness.     metoprolol succinate (TOPROL-XL) 50 MG 24 hr tablet Take 50 mg by mouth daily.     Multiple  Minerals-Vitamins (CAL MAG ZINC +D3 PO) Take 1 tablet by mouth daily.     multivitamin-lutein (OCUVITE-LUTEIN) CAPS Take 830 capsules by mouth daily.     omeprazole (PRILOSEC) 40 MG capsule Take 40 mg by mouth daily.     rOPINIRole (REQUIP) 1 MG tablet Take 1 tablet (1 mg total) by mouth 3 (three) times daily. (Patient taking differently: Take 1-2 mg by mouth See admin instructions. Take 1 tablet (1 mg) by mouth in the morning & take 2 tablets (2 mg) by mouth at night.) 270 tablet 3   simvastatin (ZOCOR) 20 MG tablet Take 20 mg by mouth every evening.     spironolactone (ALDACTONE) 25 MG tablet Take 25 mg by mouth daily.     tamoxifen (NOLVADEX) 10 MG tablet Take 1 tablet (10 mg total) by mouth daily. 90 tablet 3   traZODone (DESYREL) 50 MG tablet Take 50 mg by mouth at bedtime.      Current Facility-Administered Medications  Medication Dose Route Frequency Provider Last Rate Last Admin   sodium chloride flush (NS) 0.9 % injection 3 mL  3 mL Intravenous Q12H Runell Gess, MD        PHYSICAL EXAMINATION: ECOG PERFORMANCE STATUS: 1 - Symptomatic but completely ambulatory  Vitals:   04/13/23 1352  BP: (!) 116/53  Pulse: 85  Resp: 18  Temp: (!) 97.5 F (36.4 C)  SpO2: 97%   Filed Weights   04/13/23 1352  Weight: 150 lb 12.8 oz (68.4 kg)      LABORATORY DATA:  I have reviewed the data as listed    Latest Ref Rng & Units 01/14/2023   12:40 PM 01/12/2023    3:16 PM 08/13/2022    8:02 AM  CMP  Glucose 70 - 99 mg/dL 97  409  811   BUN 8 - 23 mg/dL 21  36  37   Creatinine 0.44 - 1.00 mg/dL 9.14  7.82  9.56   Sodium 135 - 145 mmol/L 135  138  136   Potassium 3.5 - 5.1 mmol/L 4.0  4.6  3.9   Chloride 98 - 111 mmol/L 105  105  105   CO2 22 - 32 mmol/L 25  27  22    Calcium 8.9 - 10.3 mg/dL 9.3  21.3  9.9   Total Protein 6.5 - 8.1 g/dL 7.0  6.9    Total Bilirubin 0.3 - 1.2 mg/dL 0.4  0.7    Alkaline Phos 38 - 126 U/L 57  53    AST 15 - 41 U/L 17  15    ALT 0 - 44 U/L 14  10       Lab Results  Component Value Date   WBC 8.0 01/12/2023   HGB 12.5 01/12/2023   HCT 37.4 01/12/2023   MCV 90.6 01/12/2023   PLT 286 01/12/2023   NEUTROABS 4.7 01/12/2023    ASSESSMENT & PLAN:  Ductal carcinoma in situ (DCIS) of left breast 06/19/2022: Screening detected distortion with calcifications in the left breast central 12 o'clock position measuring 1.9 cm.  No ultrasound correlate.  Axilla negative.  Stereotactic biopsy revealed  intermediate grade DCIS cribriform and papillary features ER 100%, PR 60%   Recommendation: 1.  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 Followed by adjuvant radiation therapy 09/18/2022-10/14/2022 3. Followed by antiestrogen therapy with tamoxifen 5 years started 10/15/2022   Tamoxifen toxicities: Tolerating it fairly well without any problems or concerns. She had an interaction with tamoxifen and trazodone but she quit trazodone and her symptoms have improved.  Shortness of breath to minimal exertion: She had a CT angiogram which did not show any blood clots.  She will discuss with her primary care doctor about referring to cardiology or getting a heart evaluation.  She is not anemic either.  Breast cancer surveillance: Mammograms will be ordered for August 2024.  Return to clinic in 1 year for follow-up    Orders Placed This Encounter  Procedures   MM DIAG BREAST TOMO BILATERAL    Standing Status:   Future    Standing Expiration Date:   04/12/2024    Order Specific Question:   Reason for Exam (SYMPTOM  OR DIAGNOSIS REQUIRED)    Answer:   annual mammogram    Order Specific Question:   Preferred imaging location?    Answer:   [facility]   The patient has a good understanding of the overall plan. she agrees with it. she will call with any  problems that may develop before the next visit here. Total time spent: 30 mins including face to face time and time spent for planning, charting and co-ordination of care   Tamsen Meek, MD 04/13/23    I Janan Ridge am acting as a Neurosurgeon for [employer]  I have reviewed the above documentation for accuracy and completeness, and I agree with the above.

## 2023-04-13 ENCOUNTER — Other Ambulatory Visit: Payer: Self-pay

## 2023-04-13 ENCOUNTER — Inpatient Hospital Stay: Payer: Medicare HMO | Attending: Hematology and Oncology | Admitting: Hematology and Oncology

## 2023-04-13 VITALS — BP 116/53 | HR 85 | Temp 97.5°F | Resp 18 | Ht 61.0 in | Wt 150.8 lb

## 2023-04-13 DIAGNOSIS — D0512 Intraductal carcinoma in situ of left breast: Secondary | ICD-10-CM

## 2023-04-13 DIAGNOSIS — Z79810 Long term (current) use of selective estrogen receptor modulators (SERMs): Secondary | ICD-10-CM | POA: Insufficient documentation

## 2023-04-13 DIAGNOSIS — Z923 Personal history of irradiation: Secondary | ICD-10-CM | POA: Diagnosis not present

## 2023-04-13 NOTE — Assessment & Plan Note (Addendum)
06/19/2022: Screening detected distortion with calcifications in the left breast central 12 o'clock position measuring 1.9 cm.  No ultrasound correlate.  Axilla negative.  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 Followed by adjuvant radiation therapy 09/18/2022-10/14/2022 3. Followed by antiestrogen therapy with tamoxifen 5 years started 10/15/2022   Tamoxifen toxicities: Tolerating it fairly well without any problems or concerns. She had an interaction with tamoxifen and trazodone but she quit trazodone and her symptoms have improved.  Shortness of breath to minimal exertion: She had a CT angiogram which did not show any blood clots.  She will discuss with her primary care doctor about referring to cardiology or getting a heart evaluation.  She is not anemic either.  Breast cancer surveillance: Mammograms will be ordered for August 2024.  Return to clinic in 1 year for follow-up

## 2023-05-01 DIAGNOSIS — M1990 Unspecified osteoarthritis, unspecified site: Secondary | ICD-10-CM | POA: Diagnosis not present

## 2023-05-01 DIAGNOSIS — E7800 Pure hypercholesterolemia, unspecified: Secondary | ICD-10-CM | POA: Diagnosis not present

## 2023-05-01 DIAGNOSIS — J019 Acute sinusitis, unspecified: Secondary | ICD-10-CM | POA: Diagnosis not present

## 2023-05-01 DIAGNOSIS — M899 Disorder of bone, unspecified: Secondary | ICD-10-CM | POA: Diagnosis not present

## 2023-05-01 DIAGNOSIS — N183 Chronic kidney disease, stage 3 unspecified: Secondary | ICD-10-CM | POA: Diagnosis not present

## 2023-05-01 DIAGNOSIS — K219 Gastro-esophageal reflux disease without esophagitis: Secondary | ICD-10-CM | POA: Diagnosis not present

## 2023-05-01 DIAGNOSIS — G2581 Restless legs syndrome: Secondary | ICD-10-CM | POA: Diagnosis not present

## 2023-05-01 DIAGNOSIS — F411 Generalized anxiety disorder: Secondary | ICD-10-CM | POA: Diagnosis not present

## 2023-05-01 DIAGNOSIS — D0510 Intraductal carcinoma in situ of unspecified breast: Secondary | ICD-10-CM | POA: Diagnosis not present

## 2023-05-01 DIAGNOSIS — I700 Atherosclerosis of aorta: Secondary | ICD-10-CM | POA: Diagnosis not present

## 2023-05-01 DIAGNOSIS — G479 Sleep disorder, unspecified: Secondary | ICD-10-CM | POA: Diagnosis not present

## 2023-05-01 DIAGNOSIS — I10 Essential (primary) hypertension: Secondary | ICD-10-CM | POA: Diagnosis not present

## 2023-05-12 ENCOUNTER — Other Ambulatory Visit: Payer: Self-pay

## 2023-05-12 DIAGNOSIS — D0512 Intraductal carcinoma in situ of left breast: Secondary | ICD-10-CM

## 2023-05-12 NOTE — Progress Notes (Unsigned)
Pt called to let us know she needs MM sent to [facility], not [facility]. Order discontinued per MD and re-entered for [facility]. Order faxed to [phone] with fax confirmation. Pt verbalized thanks and understanding.

## 2023-05-22 DIAGNOSIS — R309 Painful micturition, unspecified: Secondary | ICD-10-CM | POA: Diagnosis not present

## 2023-05-22 DIAGNOSIS — B3731 Acute candidiasis of vulva and vagina: Secondary | ICD-10-CM | POA: Diagnosis not present

## 2023-05-22 DIAGNOSIS — N898 Other specified noninflammatory disorders of vagina: Secondary | ICD-10-CM | POA: Diagnosis not present

## 2023-05-22 DIAGNOSIS — R300 Dysuria: Secondary | ICD-10-CM | POA: Diagnosis not present

## 2023-05-22 DIAGNOSIS — I10 Essential (primary) hypertension: Secondary | ICD-10-CM | POA: Diagnosis not present

## 2023-05-29 NOTE — Progress Notes (Unsigned)
Office Visit Note  Patient: Alicia [name]             Date of Birth: Nov 11, 1940           MRN: [id number]             PCP: Blair Heys, MD Referring: Blair Heys, MD Visit Date: 06/02/2023 Occupation: @GUAROCC @  Subjective:  No chief complaint on file.   History of Present Illness: Alicia [name] is a 82 y.o. female ***     Activities of Daily Living:  Patient reports morning stiffness for *** {minute/hour:19697}.   Patient {ACTIONS;DENIES/REPORTS:21021675::"Denies"} nocturnal pain.  Difficulty dressing/grooming: {ACTIONS;DENIES/REPORTS:21021675::"Denies"} Difficulty climbing stairs: {ACTIONS;DENIES/REPORTS:21021675::"Denies"} Difficulty getting out of chair: {ACTIONS;DENIES/REPORTS:21021675::"Denies"} Difficulty using hands for taps, buttons, cutlery, and/or writing: {ACTIONS;DENIES/REPORTS:21021675::"Denies"}  No Rheumatology ROS completed.   PMFS History:  Patient Active Problem List   Diagnosis Date Noted   Genetic testing 07/14/2022   Ductal carcinoma in situ (DCIS) of left breast 06/30/2022   Primary osteoarthritis of left hip 07/02/2021   Family history of heart disease 09/12/2020   Chest pain of uncertain etiology 09/12/2020   Essential hypertension 10/12/2018   Dyslipidemia 10/12/2018   History of kidney stones 10/12/2018   History of gastroesophageal reflux (GERD) 10/12/2018   History of anxiety 10/12/2018   Osteopenia of multiple sites 10/12/2018   Diverticulosis 10/12/2018   Primary osteoarthritis of both hands 10/12/2018   RLS (restless legs syndrome) 03/28/2016    Past Medical History:  Diagnosis Date   Anginal pain (HCC)    Anxiety 2014   Bursitis    Cancer (HCC) 06/2022   left breast DCIS   Dyspnea    with exertion   GERD (gastroesophageal reflux disease) 2008   History of radiation therapy    Left breast- 09/17/22-10/14/22- Dr. Antony Blackbird   Hypercholesteremia 2003   Hypertension 2003   Insomnia 1988   Kidney stone     Osteoarthritis 2008   hands, knees, right hip   Osteopenia 2002   Restless leg syndrome 2000   RLS (restless legs syndrome) 03/28/2016   Vertigo 2002    Family History  Problem Relation Age of Onset   Heart attack Mother    Depression Mother    Stroke Mother    Diabetes Mother    Heart attack Father    COPD Sister    COPD Sister    COPD Brother    COPD Brother    Restless legs syndrome Daughter    Restless legs syndrome Son    Restless legs syndrome Son    Breast cancer Maternal Aunt 18   Past Surgical History:  Procedure Laterality Date   ABDOMINAL HYSTERECTOMY     APPENDECTOMY     BLEPHAROPLASTY  2019   BREAST LUMPECTOMY WITH RADIOACTIVE SEED LOCALIZATION Left 07/16/2022   Procedure: LEFT BREAST LUMPECTOMY WITH RADIOACTIVE SEED LOCALIZATION;  Surgeon: Abigail Miyamoto, MD;  Location: [facility];  Service: General;  Laterality: Left;   CATARACT EXTRACTION Right 02/2010   CHOLECYSTECTOMY     COLONOSCOPY  2015   results normal   LEFT HEART CATH AND CORONARY ANGIOGRAPHY N/A 09/20/2020   Procedure: LEFT HEART CATH AND CORONARY ANGIOGRAPHY;  Surgeon: Runell Gess, MD;  Location: [facility];  Service: Cardiovascular;  Laterality: N/A;   RE-EXCISION OF BREAST LUMPECTOMY Left 08/13/2022   Procedure: RE-EXCISION OF LEFT BREAST CANCER;  Surgeon: Abigail Miyamoto, MD;  Location: [facility];  Service: General;  Laterality: Left;   ROTATOR CUFF  REPAIR Right    Social History   Social History Narrative   Lives alone in a one story home.  Has 3 children.     Semi retired.     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   Immunization History  Administered Date(s) Administered   Moderna Sars-Covid-2 Vaccination 10/19/2019, 11/15/2019, 08/13/2020, 04/04/2021     Objective: Vital Signs: There were no vitals taken for this visit.   Physical Exam   Musculoskeletal Exam: ***  CDAI Exam: CDAI Score: -- Patient Global: --;  Provider Global: -- Swollen: --; Tender: -- Joint Exam 06/02/2023   No joint exam has been documented for this visit   There is currently no information documented on the homunculus. Go to the Rheumatology activity and complete the homunculus joint exam.  Investigation: No additional findings.  Imaging: No results found.  Recent Labs: Lab Results  Component Value Date   WBC 8.0 01/12/2023   HGB 12.5 01/12/2023   PLT 286 01/12/2023   NA 135 01/14/2023   K 4.0 01/14/2023   CL 105 01/14/2023   CO2 25 01/14/2023   GLUCOSE 97 01/14/2023   BUN 21 01/14/2023   CREATININE 0.98 01/14/2023   BILITOT 0.4 01/14/2023   ALKPHOS 57 01/14/2023   AST 17 01/14/2023   ALT 14 01/14/2023   PROT 7.0 01/14/2023   ALBUMIN 3.6 01/14/2023   CALCIUM 9.3 01/14/2023   GFRAA >60 10/08/2018    Speciality Comments: No specialty comments available.  Procedures:  No procedures performed Allergies: Ibuprofen, Lexapro [escitalopram oxalate], Lisinopril, Mobic [meloxicam], Nabumetone, Prilosec otc [omeprazole magnesium], Zoloft [sertraline hcl], and Sulfa antibiotics   Assessment / Plan:     Visit Diagnoses: No diagnosis found.  Orders: No orders of the defined types were placed in this encounter.  No orders of the defined types were placed in this encounter.   Face-to-face time spent with patient was *** minutes. Greater than 50% of time was spent in counseling and coordination of care.  Follow-Up Instructions: No follow-ups on file.   Ellen Henri, CMA  Note - This record has been created using Animal nutritionist.  Chart creation errors have been sought, but may not always  have been located. Such creation errors do not reflect on  the standard of medical care.

## 2023-06-02 ENCOUNTER — Encounter (HOSPITAL_COMMUNITY): Payer: Self-pay

## 2023-06-02 ENCOUNTER — Ambulatory Visit: Payer: Medicare HMO | Attending: Rheumatology | Admitting: Rheumatology

## 2023-06-02 ENCOUNTER — Emergency Department (HOSPITAL_COMMUNITY)
Admission: EM | Admit: 2023-06-02 | Discharge: 2023-06-02 | Disposition: A | Discharge status: Home / Self Care | Payer: Medicare HMO

## 2023-06-02 ENCOUNTER — Other Ambulatory Visit: Payer: Self-pay

## 2023-06-02 VITALS — BP 61/35 | HR 120

## 2023-06-02 DIAGNOSIS — Z853 Personal history of malignant neoplasm of breast: Secondary | ICD-10-CM | POA: Insufficient documentation

## 2023-06-02 DIAGNOSIS — M19042 Primary osteoarthritis, left hand: Secondary | ICD-10-CM

## 2023-06-02 DIAGNOSIS — Z8719 Personal history of other diseases of the digestive system: Secondary | ICD-10-CM

## 2023-06-02 DIAGNOSIS — Z79899 Other long term (current) drug therapy: Secondary | ICD-10-CM | POA: Insufficient documentation

## 2023-06-02 DIAGNOSIS — R42 Dizziness and giddiness: Secondary | ICD-10-CM | POA: Diagnosis not present

## 2023-06-02 DIAGNOSIS — G2581 Restless legs syndrome: Secondary | ICD-10-CM

## 2023-06-02 DIAGNOSIS — E785 Hyperlipidemia, unspecified: Secondary | ICD-10-CM

## 2023-06-02 DIAGNOSIS — I10 Essential (primary) hypertension: Secondary | ICD-10-CM | POA: Diagnosis not present

## 2023-06-02 DIAGNOSIS — Z8659 Personal history of other mental and behavioral disorders: Secondary | ICD-10-CM

## 2023-06-02 DIAGNOSIS — M8589 Other specified disorders of bone density and structure, multiple sites: Secondary | ICD-10-CM

## 2023-06-02 DIAGNOSIS — R55 Syncope and collapse: Secondary | ICD-10-CM | POA: Insufficient documentation

## 2023-06-02 DIAGNOSIS — M1612 Unilateral primary osteoarthritis, left hip: Secondary | ICD-10-CM

## 2023-06-02 DIAGNOSIS — K5790 Diverticulosis of intestine, part unspecified, without perforation or abscess without bleeding: Secondary | ICD-10-CM

## 2023-06-02 DIAGNOSIS — K5909 Other constipation: Secondary | ICD-10-CM

## 2023-06-02 DIAGNOSIS — E860 Dehydration: Secondary | ICD-10-CM | POA: Diagnosis not present

## 2023-06-02 DIAGNOSIS — M19041 Primary osteoarthritis, right hand: Secondary | ICD-10-CM

## 2023-06-02 DIAGNOSIS — Z743 Need for continuous supervision: Secondary | ICD-10-CM | POA: Diagnosis not present

## 2023-06-02 DIAGNOSIS — I959 Hypotension, unspecified: Secondary | ICD-10-CM | POA: Diagnosis not present

## 2023-06-02 DIAGNOSIS — Z87442 Personal history of urinary calculi: Secondary | ICD-10-CM

## 2023-06-02 DIAGNOSIS — M7061 Trochanteric bursitis, right hip: Secondary | ICD-10-CM

## 2023-06-02 DIAGNOSIS — M7062 Trochanteric bursitis, left hip: Secondary | ICD-10-CM

## 2023-06-02 LAB — I-STAT CG4 LACTIC ACID, ED
Lactic Acid, Venous: 2.1 mmol/L (ref 0.5–1.9)
Lactic Acid, Venous: 1.5 mmol/L (ref 0.5–1.9)

## 2023-06-02 LAB — CBC WITH DIFFERENTIAL/PLATELET
Abs Immature Granulocytes: 0.03 10*3/uL (ref 0.00–0.07)
Basophils Absolute: 0 10*3/uL (ref 0.0–0.1)
Basophils Relative: 1 %
Eosinophils Absolute: 0.1 10*3/uL (ref 0.0–0.5)
Eosinophils Relative: 1 %
HCT: 34.5 % — ABNORMAL LOW (ref 36.0–46.0)
Hemoglobin: 11 g/dL — ABNORMAL LOW (ref 12.0–15.0)
Immature Granulocytes: 0 %
Lymphocytes Relative: 21 %
Lymphs Abs: 1.8 10*3/uL (ref 0.7–4.0)
MCH: 29.6 pg (ref 26.0–34.0)
MCHC: 31.9 g/dL (ref 30.0–36.0)
MCV: 93 fL (ref 80.0–100.0)
Monocytes Absolute: 0.7 10*3/uL (ref 0.1–1.0)
Monocytes Relative: 8 %
Neutro Abs: 6 10*3/uL (ref 1.7–7.7)
Neutrophils Relative %: 69 %
Platelets: 244 10*3/uL (ref 150–400)
RBC: 3.71 MIL/uL — ABNORMAL LOW (ref 3.87–5.11)
RDW: 12.4 % (ref 11.5–15.5)
WBC: 8.7 10*3/uL (ref 4.0–10.5)
nRBC: 0 % (ref 0.0–0.2)

## 2023-06-02 LAB — COMPREHENSIVE METABOLIC PANEL
ALT: 19 U/L (ref 0–44)
AST: 23 U/L (ref 15–41)
Albumin: 3.2 g/dL — ABNORMAL LOW (ref 3.5–5.0)
Alkaline Phosphatase: 51 U/L (ref 38–126)
Anion gap: 9 (ref 5–15)
BUN: 32 mg/dL — ABNORMAL HIGH (ref 8–23)
CO2: 22 mmol/L (ref 22–32)
Calcium: 9.6 mg/dL (ref 8.9–10.3)
Chloride: 102 mmol/L (ref 98–111)
Creatinine, Ser: 1.91 mg/dL — ABNORMAL HIGH (ref 0.44–1.00)
GFR, Estimated: 26 mL/min — ABNORMAL LOW (ref >60)
Glucose, Bld: 110 mg/dL — ABNORMAL HIGH (ref 70–99)
Potassium: 4.3 mmol/L (ref 3.5–5.1)
Sodium: 133 mmol/L — ABNORMAL LOW (ref 135–145)
Total Bilirubin: 0.8 mg/dL (ref 0.3–1.2)
Total Protein: 6.4 g/dL — ABNORMAL LOW (ref 6.5–8.1)

## 2023-06-02 MED ORDER — ACETAMINOPHEN 500 MG PO TABS
1000.0000 mg | ORAL_TABLET | Freq: Once | ORAL | Status: AC
  Administered 2023-06-02: 1000 mg via ORAL
  Filled 2023-06-02: qty 2

## 2023-06-02 MED ORDER — LACTATED RINGERS IV BOLUS
1000.0000 mL | Freq: Once | INTRAVENOUS | Status: AC
  Administered 2023-06-02: 1000 mL via INTRAVENOUS

## 2023-06-02 NOTE — Progress Notes (Signed)
Patient came to the office today.  She had a near syncopal episode while she was coming from the lobby to the office.  Her initial blood pressure was 77/46 and repeat blood pressure was 61/35.  Patient stated that her systolic blood pressure has been running in the 70s at home.  She has been having dizzy episodes at home.  Patient was transported to the emergency room via ambulance.  Patient also mentioned that she has been experiencing some discomfort in the right trochanteric bursa in her lower back.  We will schedule an appointment when she feels better. Pollyann Savoy, MD

## 2023-06-02 NOTE — Discharge Instructions (Signed)
Please follow-up with your primary care doctor to have your labs rechecked.  Your creatinine number was a little elevated today which suggest that you were dehydrated.  You did receive IV fluids here.  Please follow-up with them to see if they would like to make any changes with your medications.  Return to the emergency department for new or worsening symptoms.

## 2023-06-02 NOTE — ED Provider Notes (Signed)
Alicia [name] EMERGENCY DEPARTMENT AT [facility] Provider Note   CSN: [id number] Arrival date & time: 06/02/23  1408     History  Chief Complaint  Patient presents with   Hypotension    Alicia [name] is a 82 y.o. female.  HPI 82 year old female with past medical history of breast cancer in remission as well as hypertension presenting to the emergency department today after a near syncopal episode.  This occurred while she was at her rheumatologist office.  The patient's blood pressures were low at the time with medics.  The patient states that this is occurred multiple times in the past.  She states that she is on blood pressure medications and that occasionally she will have issues with low blood pressures.  She states that she has been feeling back to normal now.  She was been treated for urinary tract infection recently and states that her symptoms have resolved and that she only has 2 days of antibiotics left.  She came to the emergency department today after being sent by her rheumatologist.  She denies any blood in her stool or dark stools.  Denies any chest pain or shortness of breath.  States that she is feeling back to her baseline now.    Home Medications Prior to Admission medications   Medication Sig Start Date End Date Taking? Authorizing Provider  acetaminophen (TYLENOL) 650 MG CR tablet Take 1,300 mg by mouth every 8 (eight) hours as needed for pain.    [provider]  amLODipine (NORVASC) 2.5 MG tablet Take 2.5 mg by mouth daily. 09/08/22   [provider]  Bioflavonoid Products (BIOFLEX PO) Take 1 tablet by mouth daily.    [provider]  celecoxib (CELEBREX) 200 MG capsule Take 200 mg by mouth daily.    [provider]  Flax Oil-Fish Oil-Borage Oil (FISH-FLAX-BORAGE PO) Take 1 capsule by mouth daily.    [provider]  fluticasone (FLONASE) 50 MCG/ACT nasal spray Place 1 spray into both nostrils daily. Patient  taking differently: Place 1 spray into both nostrils as needed. 07/11/21   Merrilee Jansky, MD  irbesartan (AVAPRO) 300 MG tablet Take 300 mg by mouth daily. 10/11/19   [provider]  isosorbide mononitrate (IMDUR) 30 MG 24 hr tablet Take 1/2 (one-half) tablet by mouth once daily 04/07/23   Runell Gess, MD  LORazepam (ATIVAN) 0.5 MG tablet Take 0.5 mg by mouth 2 (two) times daily as needed for anxiety. 02/11/16   [provider]  meclizine (ANTIVERT) 25 MG tablet Take 25 mg by mouth 3 (three) times daily as needed for dizziness. 04/27/20   [provider]  metoprolol succinate (TOPROL-XL) 50 MG 24 hr tablet Take 50 mg by mouth daily. Patient not taking: Reported on 06/02/2023 06/10/17   [provider]  Multiple Minerals-Vitamins (CAL MAG ZINC +D3 PO) Take 1 tablet by mouth daily.    [provider]  multivitamin-lutein (OCUVITE-LUTEIN) CAPS Take 830 capsules by mouth daily.    [provider]  omeprazole (PRILOSEC) 40 MG capsule Take 40 mg by mouth daily. 02/06/16   [provider]  rOPINIRole (REQUIP) 1 MG tablet Take 1 tablet (1 mg total) by mouth 3 (three) times daily. Patient taking differently: Take 1-2 mg by mouth See admin instructions. Take 1 tablet (1 mg) by mouth in the morning & take 2 tablets (2 mg) by mouth at night. 10/20/18   Patel, Roxana Hires K, DO  simvastatin (ZOCOR) 20 MG  tablet Take 20 mg by mouth every evening. 02/06/16   [provider]  spironolactone (ALDACTONE) 25 MG tablet Take 25 mg by mouth daily. 11/24/19   [provider]  tamoxifen (NOLVADEX) 10 MG tablet Take 1 tablet (10 mg total) by mouth daily. 10/10/22   Alicia Croissant, MD  traZODone (DESYREL) 50 MG tablet Take 50 mg by mouth at bedtime.  06/10/17   [provider]      Allergies    Ibuprofen, Lexapro [escitalopram oxalate], Lisinopril, Mobic [meloxicam], Nabumetone, Prilosec otc [omeprazole magnesium], Zoloft [sertraline hcl], and Sulfa  antibiotics    Review of Systems   Review of Systems Gen: No fevers Eyes: No vision changes HEENT: no congestion, sore throat Neck: no neck stiffness Resp: no cough, shortness of breath Card: no chest pain Abd: no nausea or vomiting, no abdominal pain Extremities: no leg swelling Neuro: no weakness, numbness, tingling Skin: no rashes  Physical Exam Updated Vital Signs BP 113/68   Pulse 80   Temp 98 F (36.7 C) (Oral)   Resp 20   Ht 5' 1.5" (1.562 m)   Wt 68 kg   SpO2 97%   BMI 27.88 kg/m  Physical Exam Gen: NAD Eyes: PERRL, EOMI HEENT: no oropharyngeal swelling Neck: trachea midline Resp: clear to auscultation bilaterally Card: RRR, no murmurs, rubs, or gallops Abd: nontender, nondistended Extremities: no calf tenderness, no edema Vascular: 2+ radial pulses bilaterally, 2+ DP pulses bilaterally Skin: no rashes Psyc: acting appropriately  ED Results / Procedures / Treatments   Labs (all labs ordered are listed, but only abnormal results are displayed) Labs Reviewed  COMPREHENSIVE METABOLIC PANEL - Abnormal; Notable for the following components:      Result Value   Sodium 133 (*)    Glucose, Bld 110 (*)    BUN 32 (*)    Creatinine, Ser 1.91 (*)    Total Protein 6.4 (*)    Albumin 3.2 (*)    GFR, Estimated 26 (*)    All other components within normal limits  CBC WITH DIFFERENTIAL/PLATELET - Abnormal; Notable for the following components:   RBC 3.71 (*)    Hemoglobin 11.0 (*)    HCT 34.5 (*)    All other components within normal limits  I-STAT CG4 LACTIC ACID, ED - Abnormal; Notable for the following components:   Lactic Acid, Venous 2.1 (*)    All other components within normal limits  CBC WITH DIFFERENTIAL/PLATELET  I-STAT CG4 LACTIC ACID, ED    EKG None  Radiology No results found.  Procedures Procedures    Medications Ordered in ED Medications  lactated ringers bolus 1,000 mL (1,000 mLs Intravenous New Bag/Given 06/02/23 1456)   acetaminophen (TYLENOL) tablet 1,000 mg (1,000 mg Oral Given 06/02/23 1555)    ED Course/ Medical Decision Making/ A&P                                 Medical Decision Making 82 year old female with past medical history of breast cancer in remission and hypertension presenting to the emergency department today with a presyncopal episode.  The patient is back to her baseline currently.  Her blood pressures seem to be stabilized here.  I will further evaluate the patient here with basic labs to eval for electrolyte abnormalities or anemia.  Also obtain a lactic acid screen for septic shock.  Give the patient IV fluids here and keep her on the monitor to evaluate  for arrhythmias and reevaluate for ultimate disposition.  The patient's EKG interpreted by me shows a sinus rhythm with right bundle branch block with normal axis, normal intervals, no significant acute ST-T abnormalities.  Her labs are reassuring.  Lactic acid is mildly elevated 2.1.  The patient did receive IV fluids.  I do not think this is indicative of septic shock with a lactic acid less than 4.  The patient's lactate did go back to normal after the IV fluids.  Her creatinine was mildly elevated compared to baseline but she did receive IV fluids.  I think that she is stable for discharge.  She is ambulatory here and is feeling back to her baseline with no more lightheadedness.  She is discharged and encouraged to follow-up with her primary care provider to have her labs rechecked.  Amount and/or Complexity of Data Reviewed Labs: ordered.  Risk OTC drugs.           Final Clinical Impression(s) / ED Diagnoses Final diagnoses:  Dehydration  Dispo: discharge  Rx / DC Orders ED Discharge Orders     None         Durwin Glaze, MD 06/02/23 1712

## 2023-06-02 NOTE — ED Notes (Signed)
PT wanted something for pain in her hip due to her bursitis. Vilinda Boehringer MD was notified

## 2023-06-02 NOTE — ED Triage Notes (Addendum)
Pt arrives via EMS after seeing her rheumatologist. While patient was in the waiting room there, she became light headed and dizzy. Staff checked her BP and was found to be hypotensive (61/35). Upon EMS arrival, patient was AxOx4 and blood pressure had improved without intervention. Pt arrives to ED AxOx4, she states this occasionally happens periodically. Pt reports she is about to complete a course of abx that she was started on for a UTI. Pt reports some diarrhea.

## 2023-06-03 ENCOUNTER — Telehealth: Payer: Self-pay | Admitting: Rheumatology

## 2023-06-03 NOTE — Telephone Encounter (Signed)
Pt would like to see Dr. Corliss Skains. Is requesting a R hip injection for pain. PT states she could do a follow up with taylor if she could get the injection still.

## 2023-06-04 ENCOUNTER — Ambulatory Visit: Payer: Medicare HMO | Attending: Rheumatology | Admitting: Rheumatology

## 2023-06-04 ENCOUNTER — Encounter: Payer: Self-pay | Admitting: Rheumatology

## 2023-06-04 VITALS — BP 96/63 | HR 80 | Resp 14 | Ht 61.5 in | Wt 150.4 lb

## 2023-06-04 DIAGNOSIS — E785 Hyperlipidemia, unspecified: Secondary | ICD-10-CM

## 2023-06-04 DIAGNOSIS — M1612 Unilateral primary osteoarthritis, left hip: Secondary | ICD-10-CM

## 2023-06-04 DIAGNOSIS — I10 Essential (primary) hypertension: Secondary | ICD-10-CM

## 2023-06-04 DIAGNOSIS — R0602 Shortness of breath: Secondary | ICD-10-CM

## 2023-06-04 DIAGNOSIS — M8589 Other specified disorders of bone density and structure, multiple sites: Secondary | ICD-10-CM | POA: Diagnosis not present

## 2023-06-04 DIAGNOSIS — G2581 Restless legs syndrome: Secondary | ICD-10-CM | POA: Diagnosis not present

## 2023-06-04 DIAGNOSIS — K5790 Diverticulosis of intestine, part unspecified, without perforation or abscess without bleeding: Secondary | ICD-10-CM | POA: Diagnosis not present

## 2023-06-04 DIAGNOSIS — R0989 Other specified symptoms and signs involving the circulatory and respiratory systems: Secondary | ICD-10-CM | POA: Diagnosis not present

## 2023-06-04 DIAGNOSIS — M19042 Primary osteoarthritis, left hand: Secondary | ICD-10-CM

## 2023-06-04 DIAGNOSIS — G8929 Other chronic pain: Secondary | ICD-10-CM

## 2023-06-04 DIAGNOSIS — M19041 Primary osteoarthritis, right hand: Secondary | ICD-10-CM | POA: Diagnosis not present

## 2023-06-04 DIAGNOSIS — Z87442 Personal history of urinary calculi: Secondary | ICD-10-CM

## 2023-06-04 DIAGNOSIS — Z8719 Personal history of other diseases of the digestive system: Secondary | ICD-10-CM | POA: Diagnosis not present

## 2023-06-04 DIAGNOSIS — Z8659 Personal history of other mental and behavioral disorders: Secondary | ICD-10-CM

## 2023-06-04 DIAGNOSIS — M545 Low back pain, unspecified: Secondary | ICD-10-CM

## 2023-06-04 DIAGNOSIS — M7061 Trochanteric bursitis, right hip: Secondary | ICD-10-CM

## 2023-06-04 DIAGNOSIS — K5909 Other constipation: Secondary | ICD-10-CM | POA: Diagnosis not present

## 2023-06-04 MED ORDER — TRIAMCINOLONE ACETONIDE 40 MG/ML IJ SUSP
40.0000 mg | INTRAMUSCULAR | Status: AC | PRN
Start: 2023-06-04 — End: 2023-06-04
  Administered 2023-06-04: 40 mg via INTRA_ARTICULAR

## 2023-06-04 MED ORDER — LIDOCAINE HCL 1 % IJ SOLN
1.5000 mL | INTRAMUSCULAR | Status: AC | PRN
Start: 2023-06-04 — End: 2023-06-04
  Administered 2023-06-04: 1.5 mL

## 2023-06-04 NOTE — Telephone Encounter (Signed)
Patient scheduled for 06/04/2023 at 9:40 am.

## 2023-06-04 NOTE — Patient Instructions (Signed)
Iliotibial Band Syndrome Rehab Ask your health care provider which exercises are safe for you. Do exercises exactly as told by your provider and adjust them as told. It's normal to feel mild stretching, pulling, tightness, or discomfort as you do these exercises. Stop right away if you feel sudden pain or your pain gets a lot worse. Do not begin these exercises until told by your provider. Stretching and range-of-motion exercises These exercises warm up your muscles and joints. They also improve the movement and flexibility of your hip and pelvis. Quadriceps stretch, prone  Lie face down (prone) on a firm surface like a bed or padded floor. Bend your left / right knee. Reach back to hold your ankle or pant leg. If you can't reach your ankle or pant leg, use a belt looped around your foot and grab the belt instead. Gently pull your heel toward your butt. Your knee should not slide out to the side. You should feel a stretch in the front of your thigh and knee, also called the quadriceps. Hold this position for __________ seconds. Repeat __________ times. Complete this exercise __________ times a day. Iliotibial band stretch The iliotibial band is a strip of tissue that runs along the outside of your hip down to your knee. Lie on your side with your left / right leg on top. Bend both knees and grab your left / right ankle. Stretch out your bottom arm to help you balance. Slowly bring your top knee back so your thigh goes behind your back. Slowly lower your top leg toward the floor until you feel a gentle stretch on the outside of your left / right hip and thigh. If you don't feel a stretch and your knee won't go farther, place the heel of your other foot on top of your knee and pull your knee down toward the floor with your foot. Hold this position for __________ seconds. Repeat __________ times. Complete this exercise __________ times a day. Strengthening exercises These exercises build strength  and endurance in your hip and pelvis. Endurance means your muscles can keep working even when they're tired. Straight leg raises, side-lying This exercise strengthens the muscles that rotate the leg at the hip and move it away from your body. These muscles are called hip abductors. Lie on your side with your left / right leg on top. Lie so your head, shoulder, hip, and knee line up. You can bend your bottom knee to help you balance. Roll your hips slightly forward so they're stacked directly over each other. Your left / right knee should face forward. Tense the muscles in your outer thigh and hip. Lift your top leg 4-6 inches (10-15 cm) off the ground. Hold this position for __________ seconds. Slowly lower your leg back down to the starting position. Let your muscles fully relax before doing this exercise again. Repeat __________ times. Complete this exercise __________ times a day. Leg raises, prone This exercise strengthens the muscles that move the hips backward. These muscles are called hip extensors. Lie face down (prone) on your bed or a firm surface. You can put a pillow under your hips for comfort and to support your lower back. Bend your left / right knee so your foot points straight up toward the ceiling. Keep the other leg straight and behind you. Squeeze your butt muscles. Lift your left / right thigh off the firm surface. Do not let your back arch. Tense your thigh muscle as hard as you can without having  more knee pain. Hold this position for __________ seconds. Slowly lower your leg to the starting position. Allow your leg to relax all the way. Repeat __________ times. Complete this exercise __________ times a day. Hip hike  Stand sideways on a bottom step. Place your feet so that your left / right leg is on the step, and the other foot is hanging off the side. If you need support for balance, hold onto a railing or wall. Keep your knees straight and your abdomen square,  meaning your hips are level. Then, lift your left / right hip up toward the ceiling. Slowly let your leg that's hanging off the step lower towards the floor. Your foot should get closer to the ground. Do not lean or bend your knees during this movement. Repeat __________ times. Complete this exercise __________ times a day. This information is not intended to replace advice given to you by your health care provider. Make sure you discuss any questions you have with your health care provider. Document Revised: 12/05/2022 Document Reviewed: 12/05/2022 [employer] Patient Education  2024 [employer]   Low Back Sprain or Strain Rehab Ask your health care provider which exercises are safe for you. Do exercises exactly as told by your health care provider and adjust them as directed. It is normal to feel mild stretching, pulling, tightness, or discomfort as you do these exercises. Stop right away if you feel sudden pain or your pain gets worse. Do not begin these exercises until told by your health care provider. Stretching and range-of-motion exercises These exercises warm up your muscles and joints and improve the movement and flexibility of your back. These exercises also help to relieve pain, numbness, and tingling. Lumbar rotation  Lie on your back on a firm bed or the floor with your knees bent. Straighten your arms out to your sides so each arm forms a 90-degree angle (right angle) with a side of your body. Slowly move (rotate) both of your knees to one side of your body until you feel a stretch in your lower back (lumbar). Try not to let your shoulders lift off the floor. Hold this position for __________ seconds. Tense your abdominal muscles and slowly move your knees back to the starting position. Repeat this exercise on the other side of your body. Repeat __________ times. Complete this exercise __________ times a day. Single knee to chest  Lie on your back on a firm bed or the floor with  both legs straight. Bend one of your knees. Use your hands to move your knee up toward your chest until you feel a gentle stretch in your lower back and buttock. Hold your leg in this position by holding on to the front of your knee. Keep your other leg as straight as possible. Hold this position for __________ seconds. Slowly return to the starting position. Repeat with your other leg. Repeat __________ times. Complete this exercise __________ times a day. Prone extension on elbows  Lie on your abdomen on a firm bed or the floor (prone position). Prop yourself up on your elbows. Use your arms to help lift your chest up until you feel a gentle stretch in your abdomen and your lower back. This will place some of your body weight on your elbows. If this is uncomfortable, try stacking pillows under your chest. Your hips should stay down, against the surface that you are lying on. Keep your hip and back muscles relaxed. Hold this position for __________ seconds. Slowly relax  your upper body and return to the starting position. Repeat __________ times. Complete this exercise __________ times a day. Strengthening exercises These exercises build strength and endurance in your back. Endurance is the ability to use your muscles for a long time, even after they get tired. Pelvic tilt This exercise strengthens the muscles that lie deep in the abdomen. Lie on your back on a firm bed or the floor with your legs extended. Bend your knees so they are pointing toward the ceiling and your feet are flat on the floor. Tighten your lower abdominal muscles to press your lower back against the floor. This motion will tilt your pelvis so your tailbone points up toward the ceiling instead of pointing to your feet or the floor. To help with this exercise, you may place a small towel under your lower back and try to push your back into the towel. Hold this position for __________ seconds. Let your muscles relax  completely before you repeat this exercise. Repeat __________ times. Complete this exercise __________ times a day. Alternating arm and leg raises  Get on your hands and knees on a firm surface. If you are on a hard floor, you may want to use padding, such as an exercise mat, to cushion your knees. Line up your arms and legs. Your hands should be directly below your shoulders, and your knees should be directly below your hips. Lift your left leg behind you. At the same time, raise your right arm and straighten it in front of you. Do not lift your leg higher than your hip. Do not lift your arm higher than your shoulder. Keep your abdominal and back muscles tight. Keep your hips facing the ground. Do not arch your back. Keep your balance carefully, and do not hold your breath. Hold this position for __________ seconds. Slowly return to the starting position. Repeat with your right leg and your left arm. Repeat __________ times. Complete this exercise __________ times a day. Abdominal set with straight leg raise  Lie on your back on a firm bed or the floor. Bend one of your knees and keep your other leg straight. Tense your abdominal muscles and lift your straight leg up, 4-6 inches (10-15 cm) off the ground. Keep your abdominal muscles tight and hold this position for __________ seconds. Do not hold your breath. Do not arch your back. Keep it flat against the ground. Keep your abdominal muscles tense as you slowly lower your leg back to the starting position. Repeat with your other leg. Repeat __________ times. Complete this exercise __________ times a day. Single leg lower with bent knees Lie on your back on a firm bed or the floor. Tense your abdominal muscles and lift your feet off the floor, one foot at a time, so your knees and hips are bent in 90-degree angles (right angles). Your knees should be over your hips and your lower legs should be parallel to the floor. Keeping your  abdominal muscles tense and your knee bent, slowly lower one of your legs so your toe touches the ground. Lift your leg back up to return to the starting position. Do not hold your breath. Do not let your back arch. Keep your back flat against the ground. Repeat with your other leg. Repeat __________ times. Complete this exercise __________ times a day. Posture and body mechanics Good posture and healthy body mechanics can help to relieve stress in your body's tissues and joints. Body mechanics refers to the movements and  positions of your body while you do your daily activities. Posture is part of body mechanics. Good posture means: Your spine is in its natural S-curve position (neutral). Your shoulders are pulled back slightly. Your head is not tipped forward (neutral). Follow these guidelines to improve your posture and body mechanics in your everyday activities. Standing  When standing, keep your spine neutral and your feet about hip-width apart. Keep a slight bend in your knees. Your ears, shoulders, and hips should line up. When you do a task in which you stand in one place for a long time, place one foot up on a stable object that is 2-4 inches (5-10 cm) high, such as a footstool. This helps keep your spine neutral. Sitting  When sitting, keep your spine neutral and keep your feet flat on the floor. Use a footrest, if necessary, and keep your thighs parallel to the floor. Avoid rounding your shoulders, and avoid tilting your head forward. When working at a desk or a computer, keep your desk at a height where your hands are slightly lower than your elbows. Slide your chair under your desk so you are close enough to maintain good posture. When working at a computer, place your monitor at a height where you are looking straight ahead and you do not have to tilt your head forward or downward to look at the screen. Resting When lying down and resting, avoid positions that are most painful for  you. If you have pain with activities such as sitting, bending, stooping, or squatting, lie in a position in which your body does not bend very much. For example, avoid curling up on your side with your arms and knees near your chest (fetal position). If you have pain with activities such as standing for a long time or reaching with your arms, lie with your spine in a neutral position and bend your knees slightly. Try the following positions: Lying on your side with a pillow between your knees. Lying on your back with a pillow under your knees. Lifting  When lifting objects, keep your feet at least shoulder-width apart and tighten your abdominal muscles. Bend your knees and hips and keep your spine neutral. It is important to lift using the strength of your legs, not your back. Do not lock your knees straight out. Always ask for help to lift heavy or awkward objects. This information is not intended to replace advice given to you by your health care provider. Make sure you discuss any questions you have with your health care provider. Document Revised: 01/26/2023 Document Reviewed: 12/10/2020 [employer] Patient Education  2024 [employer].

## 2023-06-04 NOTE — Progress Notes (Signed)
Office Visit Note  Patient: Alicia [name]             Date of Birth: 09-18-1941           MRN: [id number]             PCP: Blair Heys, MD Referring: Blair Heys, MD Visit Date: 06/04/2023 Occupation: @GUAROCC @  Subjective:  Right hip pain  History of Present Illness: Alicia [name] is a 82 y.o. female returns today after her last visit in September 2022.  She has known history of osteoarthritis and osteopenia.  She was recently seen in the office and was sent to the emergency room because of hypotensive and near syncopal episode.  She was given IV fluids in the emergency room and was discharged.  She has stopped taking metoprolol and isosorbide.  She states her blood pressure has been better since then.  She was having dizzy episodes frequently at home.  She has been having pain and discomfort in the right trochanteric region for the last 6 months which is progressively getting worse.  She states the pain radiates to her back.  None of the other joints are painful.    Activities of Daily Living:  Patient reports morning stiffness for 10 minutes.   Patient Reports nocturnal pain.  Difficulty dressing/grooming: Denies Difficulty climbing stairs: Denies Difficulty getting out of chair: Denies Difficulty using hands for taps, buttons, cutlery, and/or writing: Reports  Review of Systems  Constitutional:  Positive for fatigue.  HENT:  Positive for mouth dryness. Negative for mouth sores.   Eyes:  Negative for dryness.  Respiratory:  Positive for shortness of breath. Negative for cough and wheezing.   Cardiovascular:  Negative for chest pain and palpitations.  Gastrointestinal:  Negative for blood in stool, constipation and diarrhea.  Endocrine: Negative for increased urination.  Genitourinary:  Positive for involuntary urination.  Musculoskeletal:  Positive for joint pain, joint pain, myalgias, morning stiffness and myalgias. Negative for gait problem, joint swelling, muscle  weakness and muscle tenderness.  Skin:  Negative for color change, rash, hair loss and sensitivity to sunlight.  Allergic/Immunologic: Negative for susceptible to infections.  Neurological:  Negative for dizziness, numbness and headaches.  Hematological:  Negative for swollen glands.  Psychiatric/Behavioral:  Positive for sleep disturbance. Negative for depressed mood. The patient is not nervous/anxious.     PMFS History:  Patient Active Problem List   Diagnosis Date Noted   Genetic testing 07/14/2022   Ductal carcinoma in situ (DCIS) of left breast 06/30/2022   Primary osteoarthritis of left hip 07/02/2021   Family history of heart disease 09/12/2020   Chest pain of uncertain etiology 09/12/2020   Essential hypertension 10/12/2018   Dyslipidemia 10/12/2018   History of kidney stones 10/12/2018   History of gastroesophageal reflux (GERD) 10/12/2018   History of anxiety 10/12/2018   Osteopenia of multiple sites 10/12/2018   Diverticulosis 10/12/2018   Primary osteoarthritis of both hands 10/12/2018   RLS (restless legs syndrome) 03/28/2016    Past Medical History:  Diagnosis Date   Anginal pain (HCC)    Anxiety 2014   Bursitis    Cancer (HCC) 06/2022   left breast DCIS   Dyspnea    with exertion   GERD (gastroesophageal reflux disease) 2008   History of radiation therapy    Left breast- 09/17/22-10/14/22- Dr. Antony Blackbird   Hypercholesteremia 2003   Hypertension 2003   Insomnia 1988   Kidney stone    Osteoarthritis 2008   hands,  knees, right hip   Osteopenia 2002   Restless leg syndrome 2000   RLS (restless legs syndrome) 03/28/2016   Vertigo 2002    Family History  Problem Relation Age of Onset   Heart attack Mother    Depression Mother    Stroke Mother    Diabetes Mother    Heart attack Father    COPD Sister    COPD Sister    COPD Brother    COPD Brother    Breast cancer Maternal Aunt 60   Restless legs syndrome Daughter    Restless legs syndrome Son     Restless legs syndrome Son    Past Surgical History:  Procedure Laterality Date   ABDOMINAL HYSTERECTOMY     APPENDECTOMY     BLEPHAROPLASTY  2019   BREAST LUMPECTOMY WITH RADIOACTIVE SEED LOCALIZATION Left 07/16/2022   Procedure: LEFT BREAST LUMPECTOMY WITH RADIOACTIVE SEED LOCALIZATION;  Surgeon: Abigail Miyamoto, MD;  Location: [facility];  Service: General;  Laterality: Left;   CATARACT EXTRACTION Right 02/2010   CHOLECYSTECTOMY     COLONOSCOPY  2015   results normal   LEFT HEART CATH AND CORONARY ANGIOGRAPHY N/A 09/20/2020   Procedure: LEFT HEART CATH AND CORONARY ANGIOGRAPHY;  Surgeon: Runell Gess, MD;  Location: [facility];  Service: Cardiovascular;  Laterality: N/A;   RE-EXCISION OF BREAST LUMPECTOMY Left 08/13/2022   Procedure: RE-EXCISION OF LEFT BREAST CANCER;  Surgeon: Abigail Miyamoto, MD;  Location: [facility];  Service: General;  Laterality: Left;   ROTATOR CUFF REPAIR Right    Social History   Social History Narrative   Lives alone in a one story home.  Has 3 children.     Semi retired.     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   Immunization History  Administered Date(s) Administered   [employer] Vaccination 10/19/2019, 11/15/2019, 08/13/2020, 04/04/2021     Objective: Vital Signs: BP 96/63 (BP Location: Left Arm, Patient Position: Sitting, Cuff Size: Normal)   Pulse 80   Resp 14   Ht 5' 1.5" (1.562 m)   Wt 150 lb 6.4 oz (68.2 kg)   BMI 27.96 kg/m    Physical Exam Vitals and nursing note reviewed.  Constitutional:      Appearance: She is well-developed.  HENT:     Head: Normocephalic and atraumatic.  Eyes:     Conjunctiva/sclera: Conjunctivae normal.  Cardiovascular:     Rate and Rhythm: Normal rate and regular rhythm.     Heart sounds: Normal heart sounds.  Pulmonary:     Effort: Pulmonary effort is normal.     Breath sounds: Normal breath sounds.     Comments: Crackles in  bilateral lung bases. Abdominal:     General: Bowel sounds are normal.     Palpations: Abdomen is soft.  Musculoskeletal:     Cervical back: Normal range of motion.  Lymphadenopathy:     Cervical: No cervical adenopathy.  Skin:    General: Skin is warm and dry.     Capillary Refill: Capillary refill takes less than 2 seconds.  Neurological:     Mental Status: She is alert and oriented to person, place, and time.  Psychiatric:        Behavior: Behavior normal.      Musculoskeletal Exam: She could range of motion of the cervical spine.  She had tenderness over lower lumbar region.  Shoulder joints, elbow joints, wrist joints, MCPs PIPs and DIPs  with good range of motion.  She had bilateral CMC PIP and DIP thickening with no synovitis.  She had limited range of motion of her bilateral hip joints.  She had tenderness over right trochanteric bursa.  Both knee joints with good range of motion without any warmth swelling or effusion.  There was no tenderness over ankles or MTPs.  CDAI Exam: CDAI Score: -- Patient Global: --; Provider Global: -- Swollen: --; Tender: -- Joint Exam 06/04/2023   No joint exam has been documented for this visit   There is currently no information documented on the homunculus. Go to the Rheumatology activity and complete the homunculus joint exam.  Investigation: No additional findings.  Imaging: No results found.  Recent Labs: Lab Results  Component Value Date   WBC 8.7 06/02/2023   HGB 11.0 (L) 06/02/2023   PLT 244 06/02/2023   NA 133 (L) 06/02/2023   K 4.3 06/02/2023   CL 102 06/02/2023   CO2 22 06/02/2023   GLUCOSE 110 (H) 06/02/2023   BUN 32 (H) 06/02/2023   CREATININE 1.91 (H) 06/02/2023   BILITOT 0.8 06/02/2023   ALKPHOS 51 06/02/2023   AST 23 06/02/2023   ALT 19 06/02/2023   PROT 6.4 (L) 06/02/2023   ALBUMIN 3.2 (L) 06/02/2023   CALCIUM 9.6 06/02/2023   GFRAA >60 10/08/2018    Speciality Comments: No specialty comments  available.  Procedures:  Large Joint Inj: R greater trochanter on 06/04/2023 10:03 AM Details: 27 G 1.5 in needle, lateral approach  Arthrogram: No  Medications: 40 mg triamcinolone acetonide 40 MG/ML; 1.5 mL lidocaine 1 % Aspirate: 0 mL Outcome: tolerated well, no immediate complications Procedure, treatment alternatives, risks and benefits explained, specific risks discussed. Consent was given by the patient. Immediately prior to procedure a time out was called to verify the correct patient, procedure, equipment, support staff and site/side marked as required. Patient was prepped and draped in the usual sterile fashion.     Allergies: Ibuprofen, Lexapro [escitalopram oxalate], Lisinopril, Mobic [meloxicam], Nabumetone, Prilosec otc [omeprazole magnesium], Zoloft [sertraline hcl], and Sulfa antibiotics   Assessment / Plan:     Visit Diagnoses: Trochanteric bursitis of right hip-she has been having pain and discomfort in the right trochanteric region for the last 6 months which is progressively getting worse.  She had tenderness on palpation of right trochanteric region.  Per patient's request after informed consent was obtained right trochanteric area was injected with lidocaine and Kenalog as described above.  Patient tolerated the procedure well.  Postprocedure instructions were given.  A handout on IT band stretches was given.  I also offered physical therapy but she would like to hold off.  Primary osteoarthritis of left hip-she has known osteoarthritis in her hip joints.  She had limited range of motion of bilateral hip joints without much discomfort.  Primary osteoarthritis of both hands-she had bilateral PIP and DIP thickening with no synovitis.  Chronic midline lower back pain without sciatica-she has been experiencing lower back discomfort without any radiculopathy.  She has some tenderness in the lower lumbar region.  I gave her a handout on back exercises.  I also offered physical  therapy which she declined.  Osteopenia of multiple sites - DEXA 04/11/2021 T-score: -1.03, BMD: 0.705 left femoral neck.  Patient has been getting DEXA scan through her PCP.  She plans to get repeat DEXA scan this year.  Use of calcium rich diet and vitamin D was advised.  Shortness of breath-patient is states that she  has been experiencing some shortness of breath intermittently.  Lung crackles-she had crackles in bilateral lung bases today.  I advised chest x-ray today.  Patient did not want to get chest x-ray today.  Patient states that she has an appointment coming up with her PCP next Tuesday and would like to discuss with her PCP.  I advised her to go to the urgent care or emergency room if her symptoms get worse.  Essential hypertension-patient had an near syncopal episode on June 02, 2023.  She was hypotensive in the office.  She was seen at the emergency room and was given IV fluids.  Patient states she was advised to stop metoprolol by her cardiologist in the past.  She is recently started taking isosorbide.  I advised her to discuss this further with her cardiologist.  She may need adjustment of her medications.  She has been having frequent dizzy episodes.  Patient states her blood pressure (systolic) runs in 70s at home.  Other medical problems are listed as follows:  History of gastroesophageal reflux (GERD)  Dyslipidemia  Diverticulosis  Chronic constipation  RLS (restless legs syndrome)  History of kidney stones  History of anxiety  Orders: Orders Placed This Encounter  Procedures   Large Joint Inj   No orders of the defined types were placed in this encounter.    Follow-Up Instructions: Return in about 6 months (around 12/04/2023) for Osteoarthritis.   Pollyann Savoy, MD  Note - This record has been created using Animal nutritionist.  Chart creation errors have been sought, but may not always  have been located. Such creation errors do not reflect on  the  standard of medical care.

## 2023-06-09 DIAGNOSIS — R0609 Other forms of dyspnea: Secondary | ICD-10-CM | POA: Diagnosis not present

## 2023-06-09 DIAGNOSIS — I9589 Other hypotension: Secondary | ICD-10-CM | POA: Diagnosis not present

## 2023-06-09 DIAGNOSIS — R0989 Other specified symptoms and signs involving the circulatory and respiratory systems: Secondary | ICD-10-CM | POA: Diagnosis not present

## 2023-06-09 DIAGNOSIS — N179 Acute kidney failure, unspecified: Secondary | ICD-10-CM | POA: Diagnosis not present

## 2023-06-10 DIAGNOSIS — R921 Mammographic calcification found on diagnostic imaging of breast: Secondary | ICD-10-CM | POA: Diagnosis not present

## 2023-06-10 DIAGNOSIS — Z853 Personal history of malignant neoplasm of breast: Secondary | ICD-10-CM | POA: Diagnosis not present

## 2023-06-10 DIAGNOSIS — R928 Other abnormal and inconclusive findings on diagnostic imaging of breast: Secondary | ICD-10-CM | POA: Diagnosis not present

## 2023-06-10 LAB — HM MAMMOGRAPHY

## 2023-06-11 ENCOUNTER — Ambulatory Visit
Admission: RE | Admit: 2023-06-11 | Discharge: 2023-06-11 | Disposition: A | Discharge status: Home / Self Care | Payer: Medicare HMO | Source: Ambulatory Visit | Attending: Family Medicine | Admitting: Family Medicine

## 2023-06-11 ENCOUNTER — Other Ambulatory Visit: Payer: Self-pay | Admitting: Family Medicine

## 2023-06-11 DIAGNOSIS — R0989 Other specified symptoms and signs involving the circulatory and respiratory systems: Secondary | ICD-10-CM

## 2023-06-11 DIAGNOSIS — R0789 Other chest pain: Secondary | ICD-10-CM | POA: Diagnosis not present

## 2023-06-11 DIAGNOSIS — R0602 Shortness of breath: Secondary | ICD-10-CM | POA: Diagnosis not present

## 2023-06-11 DIAGNOSIS — H5213 Myopia, bilateral: Secondary | ICD-10-CM | POA: Diagnosis not present

## 2023-06-19 DIAGNOSIS — Z23 Encounter for immunization: Secondary | ICD-10-CM | POA: Diagnosis not present

## 2023-06-19 DIAGNOSIS — F418 Other specified anxiety disorders: Secondary | ICD-10-CM | POA: Diagnosis not present

## 2023-06-19 DIAGNOSIS — R002 Palpitations: Secondary | ICD-10-CM | POA: Diagnosis not present

## 2023-06-19 DIAGNOSIS — I10 Essential (primary) hypertension: Secondary | ICD-10-CM | POA: Diagnosis not present

## 2023-06-19 DIAGNOSIS — I951 Orthostatic hypotension: Secondary | ICD-10-CM | POA: Diagnosis not present

## 2023-06-22 ENCOUNTER — Other Ambulatory Visit: Payer: Self-pay | Admitting: Radiology

## 2023-06-22 DIAGNOSIS — R928 Other abnormal and inconclusive findings on diagnostic imaging of breast: Secondary | ICD-10-CM | POA: Diagnosis not present

## 2023-06-22 DIAGNOSIS — D0511 Intraductal carcinoma in situ of right breast: Secondary | ICD-10-CM | POA: Diagnosis not present

## 2023-06-22 DIAGNOSIS — N641 Fat necrosis of breast: Secondary | ICD-10-CM | POA: Diagnosis not present

## 2023-06-23 LAB — SURGICAL PATHOLOGY

## 2023-06-26 ENCOUNTER — Other Ambulatory Visit: Payer: Self-pay | Admitting: Surgery

## 2023-06-26 DIAGNOSIS — D0511 Intraductal carcinoma in situ of right breast: Secondary | ICD-10-CM

## 2023-07-02 ENCOUNTER — Encounter: Payer: Self-pay | Admitting: *Deleted

## 2023-07-02 DIAGNOSIS — D0512 Intraductal carcinoma in situ of left breast: Secondary | ICD-10-CM

## 2023-07-03 ENCOUNTER — Telehealth: Payer: Self-pay | Admitting: Hematology and Oncology

## 2023-07-03 ENCOUNTER — Encounter: Payer: Self-pay | Admitting: Hematology and Oncology

## 2023-07-06 ENCOUNTER — Encounter: Payer: Self-pay | Admitting: Hematology and Oncology

## 2023-07-06 NOTE — Progress Notes (Incomplete)
Location of Breast Cancer:  Neoplasm of RIGHT breast  Hx of: Ductal carcinoma in situ (DCIS) of left breast   Histology per Pathology Report:  (Definitive pathology pending upcoming surgery) 06/22/2023 FINAL DIAGNOSIS  Breast, right, needle core biopsy, LOQ middle depth :       - DUCTAL CARCINOMA IN SITU, INTERMEDIATE GRADE       - NECROSIS: NOT IDENTIFIED       - CALCIFICATIONS: PRESENT       - DCIS LENGTH: 0.4 CM   Receptor Status: ER(95%), PR (90%)  Did patient present with symptoms (if so, please note symptoms) or was this found on screening mammography?: Recently treated for left sided breast cancer. Routine mammogram on 06/10/2023 revealed:    Past/Anticipated interventions by surgeon, if any: 08/08/2023 --Dr. Abigail Miyamoto  Scheduled for: RADIOACTIVE SEED GUIDED RIGHT BREAST LUMPECTOMY   06/26/2023 --Dr. Abigail Miyamoto (office visit)  Unfortunately she now has DCIS in the right breast with a history of recent treatment for DCIS in the left breast last year. Again, a radioactive seed guided right breast lumpectomy is recommended to remove this area. I discussed this with her and her daughter in detail. We will get her back into see medical and radiation oncology. She would like to hold on surgery until the end of October as she has a trip out of state plan with her daughter and is already on tamoxifen. I believe this is very reasonable. We again discussed the surgical procedure in detail. We discussed the risk which includes but is not limited to bleeding, infection, injury to surrounding structures, the need for further surgery if margins are positive, cardiopulmonary issues anesthesia, etc. We discussed postoperative recovery as well.   Past/Anticipated interventions by medical oncology, if any:  Under care of Dr. [name]  Scheduled for appointment to address newly diagnosed RIGHT breast cancer on 07/10/2023  04/13/2023 (F/U visit for LEFT sided breast  cancer) --Recommendation: 07/16/2022 left lumpectomy: DCIS cribriform and papillary types with focal necrosis grade 3 involving a complex sclerosing lesion, margins focally positive with superior/posterior margin ER 100%, PR 60%, margin reexcision 08/13/2022: Benign Followed by adjuvant radiation therapy 09/18/2022-10/14/2022 Followed by antiestrogen therapy with tamoxifen 5 years started 10/15/2022 --Tamoxifen toxicities: Tolerating it fairly well without any problems or concerns. She had an interaction with tamoxifen and trazodone but she quit trazodone and her symptoms have improved. Shortness of breath to minimal exertion: She had a CT angiogram which did not show any blood clots.  She will discuss with her primary care doctor about referring to cardiology or getting a heart evaluation.  She is not anemic either. --Breast cancer surveillance: Mammograms will be ordered for August 2024. Return to clinic in 1 year for follow-up   Lymphedema issues, if any:  No  Pain issues, if any: She stated some tenderness from biopsy.  SAFETY ISSUES: Prior radiation? Yes: 09/17/2022 through 10/14/2022 Site Technique Total Dose (Gy) Dose per Fx (Gy) Completed Fx Beam Energies  Breast, Left: Breast_L 3D 42.72/42.72 2.67 16/16 6XFFF   Pacemaker/ICD? No Possible current pregnancy? No--hysterectomy Is the patient on methotrexate? No  Current Complaints / other details:  She is concerned about how many treatments for the right breast. She asked about dehydration and fatigue during her treatments.

## 2023-07-07 NOTE — Progress Notes (Signed)
Radiation Oncology         [phone] ________________________________  Outpatient Re-Consultation  Name: Alicia [name] MRN: [id number]  Date: 07/08/2023  DOB: [birth year]  CC:Blair Heys, MD  [name], MD   REFERRING PHYSICIAN: [name], MD  DIAGNOSIS: {There were no encounter diagnoses. (Refresh or delete this SmartLink)}  Recently diagnosed Stage 0 (cTis (DCIS), cN0, cM0) Right Breast LOQ, Intermediate grade DCIS, ER+ / PR+ / Her2 not assessed  History of Stage 0 (cTis (DCIS), cN0, cM0) Left Breast, Intermediate to high-grade DCIS, ER+ / PR+ / Her2 not assessed, s/p lumpectomy, adjuvant radiation therapy, and antiestrogen therapy (tamoxifen).   Interval Since Last Radiation: 8 months and 23 days    Intent: Curative  Radiation Treatment Dates: 09/17/2022 through 10/14/2022 Site Technique Total Dose (Gy) Dose per Fx (Gy) Completed Fx Beam Energies  Breast, Left: Breast_L 3D 42.72/42.72 2.67 16/16 6XFFF    HISTORY OF PRESENT ILLNESS::Alicia [name] is a 82 y.o. female who is accompanied by ***. she returns today as a Research officer, political party of Dr. Pamelia Hoit for an opinion concerning radiation therapy as part of management for her recently diagnosed right breast DCIS.   She was last seen here on 11/20/22 for follow-up of radiation to the left breast, and has since continued to follow-up with Dr. Pamelia Hoit. She has continued on tamoxifen which she continues to tolerate well. She previously encountered an interaction between tamoxifen and trazodone which has since resolved after discontinuing trazodone.   In the setting of her history of left breast DCIS, she presented for a bilateral diagnostic mammogram on 06/10/23 [facility]) which demonstrated a new 1.4 cm group of linear calcifications in the lower outer right breast, located 4.5 cmfn. No other abnormalities were appreciated in either breast.   Biopsy of the lower outer right breast calcifications on 06/22/23 showed intermediate grade DCIS  measuring 4 mm in the greatest linear extent of the sample. Prognostic indicators significant for: estrogen receptor 95% positive and progesterone receptor 90% positive, both with strong staining intensity; Her2 not assessed.   She was seen in consultation by Dr. Magnus Ivan on 06/26/23 and has agreed to proceed with breast conserving surgery. Her procedure is currently scheduled for 08/11/23.   Of note: for evaluation of chest pain and SOB, she presented for a CTA of the chest with contrast on 01/15/23 which showed no evidence of PE and no acute pulmonary abnormalities overall.    PREVIOUS RADIATION THERAPY: Yes (as detailed above)  PAST MEDICAL HISTORY:  Past Medical History:  Diagnosis Date   Anginal pain (HCC)    Anxiety 2014   Bursitis    Cancer (HCC) 06/2022   left breast DCIS   Dyspnea    with exertion   GERD (gastroesophageal reflux disease) 2008   History of radiation therapy    Left breast- 09/17/22-10/14/22- Dr. Antony Blackbird   Hypercholesteremia 2003   Hypertension 2003   Insomnia 1988   Kidney stone    Osteoarthritis 2008   hands, knees, right hip   Osteopenia 2002   Restless leg syndrome 2000   RLS (restless legs syndrome) 03/28/2016   Vertigo 2002    PAST SURGICAL HISTORY: Past Surgical History:  Procedure Laterality Date   ABDOMINAL HYSTERECTOMY     APPENDECTOMY     BLEPHAROPLASTY  2019   BREAST LUMPECTOMY WITH RADIOACTIVE SEED LOCALIZATION Left 07/16/2022   Procedure: LEFT BREAST LUMPECTOMY WITH RADIOACTIVE SEED LOCALIZATION;  Surgeon: Abigail Miyamoto, MD;  Location: [facility];  Service: General;  Laterality: Left;   CATARACT EXTRACTION Right 02/2010   CHOLECYSTECTOMY     COLONOSCOPY  2015   results normal   LEFT HEART CATH AND CORONARY ANGIOGRAPHY N/A 09/20/2020   Procedure: LEFT HEART CATH AND CORONARY ANGIOGRAPHY;  Surgeon: Runell Gess, MD;  Location: [facility];  Service: Cardiovascular;  Laterality: N/A;   RE-EXCISION OF  BREAST LUMPECTOMY Left 08/13/2022   Procedure: RE-EXCISION OF LEFT BREAST CANCER;  Surgeon: Abigail Miyamoto, MD;  Location: [facility];  Service: General;  Laterality: Left;   ROTATOR CUFF REPAIR Right     FAMILY HISTORY:  Family History  Problem Relation Age of Onset   Heart attack Mother    Depression Mother    Stroke Mother    Diabetes Mother    Heart attack Father    COPD Sister    COPD Sister    COPD Brother    COPD Brother    Breast cancer Maternal Aunt 60   Restless legs syndrome Daughter    Restless legs syndrome Son    Restless legs syndrome Son     SOCIAL HISTORY:  Social History   Tobacco Use   Smoking status: Former    Current packs/day: 0.00    Average packs/day: 0.5 packs/day for 20.0 years (10.0 ttl pk-yrs)    Types: Cigarettes    Start date: 10/06/1976    Quit date: 10/06/1996    Years since quitting: 26.7    Passive exposure: Past   Smokeless tobacco: Never  Vaping Use   Vaping status: Never Used  Substance Use Topics   Alcohol use: No    Alcohol/week: 0.0 standard drinks of alcohol   Drug use: No    ALLERGIES:  Allergies  Allergen Reactions   Ibuprofen Other (See Comments)    GI UPSET   Lexapro [Escitalopram Oxalate] Other (See Comments)    GERD   Lisinopril Cough   Mobic [Meloxicam] Other (See Comments)    LEG PAIN   Nabumetone Other (See Comments)    GI UPSET   Prilosec Otc [Omeprazole Magnesium] Other (See Comments)    ARM SPASM   Zoloft [Sertraline Hcl] Other (See Comments)    INSOMNIA   Sulfa Antibiotics Rash    MEDICATIONS:  Current Outpatient Medications  Medication Sig Dispense Refill   acetaminophen (TYLENOL) 650 MG CR tablet Take 1,300 mg by mouth every 8 (eight) hours as needed for pain.     amLODipine (NORVASC) 2.5 MG tablet Take 2.5 mg by mouth daily.     Bioflavonoid Products (BIOFLEX PO) Take 1 tablet by mouth daily.     celecoxib (CELEBREX) 200 MG capsule Take 200 mg by mouth daily.     Flax Oil-Fish  Oil-Borage Oil (FISH-FLAX-BORAGE PO) Take 1 capsule by mouth daily.     fluticasone (FLONASE) 50 MCG/ACT nasal spray Place 1 spray into both nostrils daily. (Patient taking differently: Place 1 spray into both nostrils as needed.) 16 g 0   irbesartan (AVAPRO) 300 MG tablet Take 300 mg by mouth daily.     isosorbide mononitrate (IMDUR) 30 MG 24 hr tablet Take 1/2 (one-half) tablet by mouth once daily 45 tablet 3   LORazepam (ATIVAN) 0.5 MG tablet Take 0.5 mg by mouth 2 (two) times daily as needed for anxiety.     meclizine (ANTIVERT) 25 MG tablet Take 25 mg by mouth 3 (three) times daily as needed for dizziness.     metoprolol succinate (TOPROL-XL) 50 MG 24 hr tablet Take 50  mg by mouth daily. (Patient not taking: Reported on 06/02/2023)     Multiple Minerals-Vitamins (CAL MAG ZINC +D3 PO) Take 1 tablet by mouth daily.     multivitamin-lutein (OCUVITE-LUTEIN) CAPS Take 830 capsules by mouth daily.     omeprazole (PRILOSEC) 40 MG capsule Take 40 mg by mouth daily.     rOPINIRole (REQUIP) 1 MG tablet Take 1 tablet (1 mg total) by mouth 3 (three) times daily. (Patient taking differently: Take 1-2 mg by mouth See admin instructions. Take 1 tablet (1 mg) by mouth in the morning & take 2 tablets (2 mg) by mouth at night.) 270 tablet 3   simvastatin (ZOCOR) 20 MG tablet Take 20 mg by mouth every evening.     spironolactone (ALDACTONE) 25 MG tablet Take 25 mg by mouth daily.     tamoxifen (NOLVADEX) 10 MG tablet Take 1 tablet (10 mg total) by mouth daily. 90 tablet 3   traZODone (DESYREL) 50 MG tablet Take 50 mg by mouth at bedtime.      Current Facility-Administered Medications  Medication Dose Route Frequency Provider Last Rate Last Admin   sodium chloride flush (NS) 0.9 % injection 3 mL  3 mL Intravenous Q12H Runell Gess, MD        REVIEW OF SYSTEMS:  A 10+ POINT REVIEW OF SYSTEMS WAS OBTAINED including neurology, dermatology, psychiatry, cardiac, respiratory, lymph, extremities, GI, GU,  musculoskeletal, constitutional, reproductive, HEENT. ***   PHYSICAL EXAM:  vitals were not taken for this visit.   General: Alert and oriented, in no acute distress HEENT: Head is normocephalic. Extraocular movements are intact. Oropharynx is clear. Neck: Neck is supple, no palpable cervical or supraclavicular lymphadenopathy. Heart: Regular in rate and rhythm with no murmurs, rubs, or gallops. Chest: Clear to auscultation bilaterally, with no rhonchi, wheezes, or rales. Abdomen: Soft, nontender, nondistended, with no rigidity or guarding. Extremities: No cyanosis or edema. Lymphatics: see Neck Exam Skin: No concerning lesions. Musculoskeletal: symmetric strength and muscle tone throughout. Neurologic: Cranial nerves II through XII are grossly intact. No obvious focalities. Speech is fluent. Coordination is intact. Psychiatric: Judgment and insight are intact. Affect is appropriate.  Left Breast: *** no palpable mass, nipple discharge or bleeding. Right Breast: *** no palpable mass, nipple discharge or bleeding.   ECOG = ***  0 - Asymptomatic (Fully active, able to carry on all predisease activities without restriction)  1 - Symptomatic but completely ambulatory (Restricted in physically strenuous activity but ambulatory and able to carry out work of a light or sedentary nature. For example, light housework, office work)  2 - Symptomatic, <50% in bed during the day (Ambulatory and capable of all self care but unable to carry out any work activities. Up and about more than 50% of waking hours)  3 - Symptomatic, >50% in bed, but not bedbound (Capable of only limited self-care, confined to bed or chair 50% or more of waking hours)  4 - Bedbound (Completely disabled. Cannot carry on any self-care. Totally confined to bed or chair)  5 - Death   Santiago Glad MM, Creech RH, Tormey DC, et al. [phone]). "Toxicity and response criteria of the [facility] [employer]". Am. Evlyn Clines. Oncol. 5  (6): 649-55  LABORATORY DATA:  Lab Results  Component Value Date   WBC 8.7 06/02/2023   HGB 11.0 (L) 06/02/2023   HCT 34.5 (L) 06/02/2023   MCV 93.0 06/02/2023   PLT 244 06/02/2023   NEUTROABS 6.0 06/02/2023   Lab Results  Component  Value Date   NA 133 (L) 06/02/2023   K 4.3 06/02/2023   CL 102 06/02/2023   CO2 22 06/02/2023   GLUCOSE 110 (H) 06/02/2023   BUN 32 (H) 06/02/2023   CREATININE 1.91 (H) 06/02/2023   CALCIUM 9.6 06/02/2023      RADIOGRAPHY: DG Chest 2 View  Result Date: 06/22/2023 CLINICAL DATA:  Lung crackles, shortness of breath and chest tightness. EXAM: CHEST - 2 VIEW COMPARISON:  11/19/2017 and chest CTA 01/15/2023 FINDINGS: Again noted are mediastinal calcifications compatible with old granulomatous disease. Both lungs are clear. Heart and mediastinum are within normal limits. Surgical clips in the left lower chest and right upper abdomen. No acute bone abnormality. No large pleural effusions. IMPRESSION: 1. No active cardiopulmonary disease. 2. Old granulomatous disease. Electronically Signed   By: Richarda Overlie M.D.   On: 06/22/2023 11:42      IMPRESSION: Recently diagnosed Stage 0 (cTis (DCIS), cN0, cM0) Right Breast LOQ, Intermediate grade DCIS, ER+ / PR+ / Her2 not assessed  History of Stage 0 (cTis (DCIS), cN0, cM0) Left Breast, Intermediate to high-grade DCIS, ER+ / PR+ / Her2 not assessed, s/p lumpectomy, adjuvant radiation therapy, and antiestrogen therapy (tamoxifen).   ***  Today, I talked to the patient and family about the findings and work-up thus far.  We discussed the natural history of *** and general treatment, highlighting the role of radiotherapy in the management.  We discussed the available radiation techniques, and focused on the details of logistics and delivery.  We reviewed the anticipated acute and late sequelae associated with radiation in this setting.  The patient was encouraged to ask questions that I answered to the best of my ability.  *** A patient consent form was discussed and signed.  We retained a copy for our records.  The patient would like to proceed with radiation and will be scheduled for CT simulation.  PLAN: ***    *** minutes of total time was spent for this patient encounter, including preparation, face-to-face counseling with the patient and coordination of care, physical exam, and documentation of the encounter.   ------------------------------------------------  Billie Lade, PhD, MD  This document serves as a record of services personally performed by Antony Blackbird, MD. It was created on his behalf by Neena Rhymes, a trained medical scribe. The creation of this record is based on the scribe's personal observations and the provider's statements to them. This document has been checked and approved by the attending provider.

## 2023-07-08 ENCOUNTER — Ambulatory Visit
Admission: RE | Admit: 2023-07-08 | Discharge: 2023-07-08 | Discharge status: Home / Self Care | Payer: Medicare HMO | Source: Ambulatory Visit | Attending: Radiation Oncology | Admitting: Radiation Oncology

## 2023-07-08 ENCOUNTER — Encounter: Payer: Self-pay | Admitting: Radiation Oncology

## 2023-07-08 VITALS — BP 122/65 | HR 90 | Temp 97.5°F | Resp 18 | Ht 61.5 in | Wt 149.1 lb

## 2023-07-08 DIAGNOSIS — Z79899 Other long term (current) drug therapy: Secondary | ICD-10-CM | POA: Diagnosis not present

## 2023-07-08 DIAGNOSIS — D0511 Intraductal carcinoma in situ of right breast: Secondary | ICD-10-CM

## 2023-07-08 DIAGNOSIS — I10 Essential (primary) hypertension: Secondary | ICD-10-CM | POA: Diagnosis not present

## 2023-07-08 DIAGNOSIS — D0512 Intraductal carcinoma in situ of left breast: Secondary | ICD-10-CM | POA: Insufficient documentation

## 2023-07-08 DIAGNOSIS — Z87442 Personal history of urinary calculi: Secondary | ICD-10-CM | POA: Insufficient documentation

## 2023-07-08 DIAGNOSIS — G4700 Insomnia, unspecified: Secondary | ICD-10-CM | POA: Insufficient documentation

## 2023-07-08 DIAGNOSIS — Z79810 Long term (current) use of selective estrogen receptor modulators (SERMs): Secondary | ICD-10-CM | POA: Insufficient documentation

## 2023-07-08 DIAGNOSIS — Z87891 Personal history of nicotine dependence: Secondary | ICD-10-CM | POA: Insufficient documentation

## 2023-07-08 DIAGNOSIS — G2581 Restless legs syndrome: Secondary | ICD-10-CM | POA: Diagnosis not present

## 2023-07-08 DIAGNOSIS — K219 Gastro-esophageal reflux disease without esophagitis: Secondary | ICD-10-CM | POA: Diagnosis not present

## 2023-07-08 DIAGNOSIS — Z803 Family history of malignant neoplasm of breast: Secondary | ICD-10-CM | POA: Diagnosis not present

## 2023-07-08 DIAGNOSIS — Z923 Personal history of irradiation: Secondary | ICD-10-CM | POA: Insufficient documentation

## 2023-07-08 DIAGNOSIS — Z791 Long term (current) use of non-steroidal anti-inflammatories (NSAID): Secondary | ICD-10-CM | POA: Diagnosis not present

## 2023-07-08 DIAGNOSIS — M8580 Other specified disorders of bone density and structure, unspecified site: Secondary | ICD-10-CM | POA: Insufficient documentation

## 2023-07-08 DIAGNOSIS — M1990 Unspecified osteoarthritis, unspecified site: Secondary | ICD-10-CM | POA: Diagnosis not present

## 2023-07-08 DIAGNOSIS — Z170 Estrogen receptor positive status [ER+]: Secondary | ICD-10-CM | POA: Diagnosis not present

## 2023-07-08 DIAGNOSIS — E7800 Pure hypercholesterolemia, unspecified: Secondary | ICD-10-CM | POA: Insufficient documentation

## 2023-07-08 DIAGNOSIS — D493 Neoplasm of unspecified behavior of breast: Secondary | ICD-10-CM

## 2023-07-10 ENCOUNTER — Inpatient Hospital Stay: Payer: Medicare HMO | Attending: Hematology and Oncology | Admitting: Hematology and Oncology

## 2023-07-10 VITALS — BP 130/66 | HR 113 | Temp 97.9°F | Resp 18 | Ht 61.5 in | Wt 150.2 lb

## 2023-07-10 DIAGNOSIS — Z923 Personal history of irradiation: Secondary | ICD-10-CM | POA: Insufficient documentation

## 2023-07-10 DIAGNOSIS — D0512 Intraductal carcinoma in situ of left breast: Secondary | ICD-10-CM | POA: Insufficient documentation

## 2023-07-10 DIAGNOSIS — Z79810 Long term (current) use of selective estrogen receptor modulators (SERMs): Secondary | ICD-10-CM | POA: Diagnosis not present

## 2023-07-10 NOTE — Progress Notes (Signed)
Patient Care Team: Blair Heys, MD as PCP - General (Family Medicine) Abigail Miyamoto, MD as Consulting Physician (General Surgery) [name], MD as Consulting Physician (Hematology and Oncology) Antony Blackbird, MD as Consulting Physician (Radiation Oncology)  DIAGNOSIS:  Encounter Diagnosis  Name Primary?   Ductal carcinoma in situ (DCIS) of left breast Yes    SUMMARY OF ONCOLOGIC HISTORY: Oncology History  Ductal carcinoma in situ (DCIS) of left breast  06/19/2022 Initial Diagnosis   Screening detected distortion with calcifications in the left breast central 12 o'clock position measuring 1.9 cm.  No ultrasound correlate.  Axilla negative.  Stereotactic biopsy revealed intermediate grade DCIS cribriform and papillary features ER 100%, PR 60%   07/02/2022 Cancer Staging   Staging form: Breast, AJCC 8th Edition - Clinical: Stage 0 (cTis (DCIS), cN0, cM0, G2, ER+, PR+) - Signed by [name], MD on 07/02/2022 Stage prefix: Initial diagnosis Histologic grading system: 3 grade system    Genetic Testing   [facility] CustomNext Panel was Negative. Report date is 07/11/2022.  The CustomNext-Cancer+RNAinsight panel offered by [employer] [facility] includes sequencing and rearrangement analysis for the following 47 genes:  APC, ATM, AXIN2, BARD1, BMPR1A, BRCA1, BRCA2, BRIP1, CDH1, CDK4, CDKN2A, CHEK2, CTNNA1, DICER1, EPCAM, GREM1, HOXB13, KIT, MEN1, MLH1, MSH2, MSH3, MSH6, MUTYH, NBN, NF1, NTHL1, PALB2, PDGFRA, PMS2, POLD1, POLE, PTEN, RAD50, RAD51C, RAD51D, SDHA, SDHB, SDHC, SDHD, SMAD4, SMARCA4, STK11, TP53, TSC1, TSC2, and VHL.  RNA data is routinely analyzed for use in variant interpretation for all genes.   07/16/2022 Surgery   Left lumpectomy: DCIS cribriform and papillary types with focal necrosis grade 3 involving a complex sclerosing lesion, margins focally positive with superior/posterior margin ER 100%, PR 60%   08/13/2022 Surgery   Reexcision of the margins: Benign   09/17/2022  - 10/14/2022 Radiation Therapy   Site Technique Total Dose (Gy) Dose per Fx (Gy) Completed Fx Beam Energies  Breast, Left: Breast_L 3D 42.72/42.72 2.67 16/16 6XFFF     10/2022 -  Anti-estrogen oral therapy   Tamoxifen x 5 years     CHIEF COMPLIANT: Contralateral/right breast DCIS  History of Present Illness   The patient, with a history of DCIS treated with lumpectomy, radiation, and tamoxifen, presents after a recent mammogram revealed a 1.4 cm group of calcifications in the opposite breast. The biopsy confirmed recurrent DCIS, similar to the previous occurrence. The patient reports adherence to tamoxifen therapy, but the recurrence suggests the medication was ineffective. She also reports shortness of breath, which has limited her physical activity. She has been taking trazodone for sleep, but discontinued it due to concerns about potential interactions with tamoxifen.       ALLERGIES:  is allergic to ibuprofen, lexapro [escitalopram oxalate], lisinopril, mobic [meloxicam], nabumetone, prilosec otc [omeprazole magnesium], zoloft [sertraline hcl], and sulfa antibiotics.  MEDICATIONS:  Current Outpatient Medications  Medication Sig Dispense Refill   acetaminophen (TYLENOL) 650 MG CR tablet Take 1,300 mg by mouth every 8 (eight) hours as needed for pain.     amLODipine (NORVASC) 2.5 MG tablet Take 2.5 mg by mouth daily.     Bioflavonoid Products (BIOFLEX PO) Take 1 tablet by mouth daily.     celecoxib (CELEBREX) 200 MG capsule Take 200 mg by mouth daily.     Flax Oil-Fish Oil-Borage Oil (FISH-FLAX-BORAGE PO) Take 1 capsule by mouth daily.     fluticasone (FLONASE) 50 MCG/ACT nasal spray Place 1 spray into both nostrils daily. (Patient taking differently: Place 1 spray into both nostrils as needed.) 16  g 0   irbesartan (AVAPRO) 300 MG tablet Take 300 mg by mouth daily.     isosorbide mononitrate (IMDUR) 30 MG 24 hr tablet Take 1/2 (one-half) tablet by mouth once daily 45 tablet 3   LORazepam  (ATIVAN) 0.5 MG tablet Take 0.5 mg by mouth 2 (two) times daily as needed for anxiety.     meclizine (ANTIVERT) 25 MG tablet Take 25 mg by mouth 3 (three) times daily as needed for dizziness.     metoprolol succinate (TOPROL-XL) 50 MG 24 hr tablet Take 50 mg by mouth daily. (Patient not taking: Reported on 06/02/2023)     Multiple Minerals-Vitamins (CAL MAG ZINC +D3 PO) Take 1 tablet by mouth daily.     multivitamin-lutein (OCUVITE-LUTEIN) CAPS Take 830 capsules by mouth daily.     omeprazole (PRILOSEC) 40 MG capsule Take 40 mg by mouth daily.     rOPINIRole (REQUIP) 1 MG tablet Take 1 tablet (1 mg total) by mouth 3 (three) times daily. (Patient taking differently: Take 1-2 mg by mouth See admin instructions. Take 1 tablet (1 mg) by mouth in the morning & take 2 tablets (2 mg) by mouth at night.) 270 tablet 3   simvastatin (ZOCOR) 20 MG tablet Take 20 mg by mouth every evening.     spironolactone (ALDACTONE) 25 MG tablet Take 25 mg by mouth daily. (Patient not taking: Reported on 07/08/2023)     traZODone (DESYREL) 50 MG tablet Take 50 mg by mouth at bedtime.      Current Facility-Administered Medications  Medication Dose Route Frequency Provider Last Rate Last Admin   sodium chloride flush (NS) 0.9 % injection 3 mL  3 mL Intravenous Q12H Runell Gess, MD        PHYSICAL EXAMINATION: ECOG PERFORMANCE STATUS: 1 - Symptomatic but completely ambulatory  Vitals:   07/10/23 1202  BP: 130/66  Pulse: (!) 113  Resp: 18  Temp: 97.9 F (36.6 C)  SpO2: 98%   Filed Weights   07/10/23 1202  Weight: 150 lb 3.2 oz (68.1 kg)      LABORATORY DATA:  I have reviewed the data as listed    Latest Ref Rng & Units 06/02/2023    2:47 PM 01/14/2023   12:40 PM 01/12/2023    3:16 PM  CMP  Glucose 70 - 99 mg/dL 409  97  811   BUN 8 - 23 mg/dL 32  21  36   Creatinine 0.44 - 1.00 mg/dL 9.14  7.82  9.56   Sodium 135 - 145 mmol/L 133  135  138   Potassium 3.5 - 5.1 mmol/L 4.3  4.0  4.6   Chloride 98 -  111 mmol/L 102  105  105   CO2 22 - 32 mmol/L 22  25  27    Calcium 8.9 - 10.3 mg/dL 9.6  9.3  21.3   Total Protein 6.5 - 8.1 g/dL 6.4  7.0  6.9   Total Bilirubin 0.3 - 1.2 mg/dL 0.8  0.4  0.7   Alkaline Phos 38 - 126 U/L 51  57  53   AST 15 - 41 U/L 23  17  15    ALT 0 - 44 U/L 19  14  10      Lab Results  Component Value Date   WBC 8.7 06/02/2023   HGB 11.0 (L) 06/02/2023   HCT 34.5 (L) 06/02/2023   MCV 93.0 06/02/2023   PLT 244 06/02/2023   NEUTROABS 6.0 06/02/2023    ASSESSMENT &  PLAN:  Ductal carcinoma in situ (DCIS) of left breast 06/19/2022: Screening detected distortion with calcifications in the left breast central 12 o'clock position measuring 1.9 cm.  No ultrasound correlate.  Axilla negative.  Stereotactic biopsy revealed intermediate grade DCIS cribriform and papillary features ER 100%, PR 60%   Treatment summary: 1.  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 Followed by adjuvant radiation therapy 09/18/2022-10/14/2022 3. Followed by antiestrogen therapy with tamoxifen 5 years started 10/15/2022   Tamoxifen toxicities: Tolerating it fairly well without any problems or concerns. She had an interaction with tamoxifen and trazodone but she quit trazodone and her symptoms have improved.  Breast cancer surveillance: Mammograms 06/10/2023: [facility]: 1.4 cm linear calcifications right lower outer quadrant: Stereotactic biopsy: Intermediate grade DCIS ER 95%, PR 90%  Right breast lumpectomy scheduled for 08/10/2023. This will be followed by radiation and antiestrogen therapy. Follow-up 08/25/2023 to discuss final pathology report and consider antiestrogen therapy with anastrozole.   No orders of the defined types were placed in this encounter.  The patient has a good understanding of  the overall plan. she agrees with it. she will call with any problems that may develop before the next visit here. Total time spent: 30 mins including face to face time and time spent for planning, charting and co-ordination of care   Tamsen Meek, MD 07/10/23

## 2023-07-10 NOTE — Assessment & Plan Note (Addendum)
06/19/2022: Screening detected distortion with calcifications in the left breast central 12 o'clock position measuring 1.9 cm.  No ultrasound correlate.  Axilla negative.  Stereotactic biopsy revealed intermediate grade DCIS cribriform and papillary features ER 100%, PR 60%   Treatment summary: 1.  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 Followed by adjuvant radiation therapy 09/18/2022-10/14/2022 3. Followed by antiestrogen therapy with tamoxifen 5 years started 10/15/2022   Tamoxifen toxicities: Tolerating it fairly well without any problems or concerns. She had an interaction with tamoxifen and trazodone but she quit trazodone and her symptoms have improved.  Breast cancer surveillance: Mammograms 06/10/2023: [facility]: 1.4 cm linear calcifications right lower outer quadrant: Stereotactic biopsy: Intermediate grade DCIS ER 95%, PR 90%  Right breast lumpectomy scheduled for 08/10/2023. This will be followed by radiation and antiestrogen therapy. Follow-up 08/25/2023 to discuss final pathology report and consider antiestrogen therapy with anastrozole.

## 2023-07-13 ENCOUNTER — Encounter: Payer: Self-pay | Admitting: *Deleted

## 2023-07-13 DIAGNOSIS — D0512 Intraductal carcinoma in situ of left breast: Secondary | ICD-10-CM

## 2023-07-16 ENCOUNTER — Inpatient Hospital Stay
Admission: RE | Admit: 2023-07-16 | Discharge: 2023-07-16 | Disposition: A | Discharge status: Home / Self Care | Payer: Self-pay | Source: Ambulatory Visit | Attending: Radiation Oncology | Admitting: Radiation Oncology

## 2023-07-16 ENCOUNTER — Other Ambulatory Visit: Payer: Self-pay | Admitting: Radiation Oncology

## 2023-07-16 DIAGNOSIS — D0512 Intraductal carcinoma in situ of left breast: Secondary | ICD-10-CM

## 2023-08-04 NOTE — Progress Notes (Unsigned)
Cardiology Clinic Note   Patient Name: Alicia [name] Date of Encounter: 08/05/2023  Primary Care Provider:  Debroah Loop, DO Primary Cardiologist:  None  Patient Profile    Alicia [name] 83 year old female presents the clinic today for follow-up evaluation of her chest discomfort and preoperative cardiac evaluation.  Past Medical History    Past Medical History:  Diagnosis Date   Anginal pain (HCC)    Anxiety 2014   Bursitis    Cancer (HCC) 06/2022   left breast DCIS   Dyspnea    with exertion   GERD (gastroesophageal reflux disease) 2008   History of radiation therapy    Left breast- 09/17/22-10/14/22- Dr. Antony Blackbird   Hypercholesteremia 2003   Hypertension 2003   Insomnia 1988   Kidney stone    Osteoarthritis 2008   hands, knees, right hip   Osteopenia 2002   Restless leg syndrome 2000   RLS (restless legs syndrome) 03/28/2016   Vertigo 2002   Past Surgical History:  Procedure Laterality Date   ABDOMINAL HYSTERECTOMY     APPENDECTOMY     BLEPHAROPLASTY  2019   BREAST LUMPECTOMY WITH RADIOACTIVE SEED LOCALIZATION Left 07/16/2022   Procedure: LEFT BREAST LUMPECTOMY WITH RADIOACTIVE SEED LOCALIZATION;  Surgeon: Abigail Miyamoto, MD;  Location: [facility];  Service: General;  Laterality: Left;   CATARACT EXTRACTION Right 02/2010   CHOLECYSTECTOMY     COLONOSCOPY  2015   results normal   LEFT HEART CATH AND CORONARY ANGIOGRAPHY N/A 09/20/2020   Procedure: LEFT HEART CATH AND CORONARY ANGIOGRAPHY;  Surgeon: Runell Gess, MD;  Location: [facility];  Service: Cardiovascular;  Laterality: N/A;   RE-EXCISION OF BREAST LUMPECTOMY Left 08/13/2022   Procedure: RE-EXCISION OF LEFT BREAST CANCER;  Surgeon: Abigail Miyamoto, MD;  Location: [facility];  Service: General;  Laterality: Left;   ROTATOR CUFF REPAIR Right     Allergies  Allergies  Allergen Reactions   Ibuprofen Other (See Comments)    GI UPSET   Lexapro  [Escitalopram Oxalate] Other (See Comments)    GERD   Lisinopril Cough   Mobic [Meloxicam] Other (See Comments)    LEG PAIN   Nabumetone Other (See Comments)    GI UPSET   Prilosec Otc [Omeprazole Magnesium] Other (See Comments)    ARM SPASM   Zoloft [Sertraline Hcl] Other (See Comments)    INSOMNIA   Sulfa Antibiotics Rash    History of Present Illness    VADNA MONTIGNY has a PMH of HTN, diverticulosis, osteopenia, RLS, HLD, anxiety, chest pain of uncertain etiology, and breast CA.  She has a 15-20 pack year of tobacco abuse.  She stopped smoking around 25 years ago.  Her parents both died of MI.  She was initially referred to cardiology for evaluation of her chest discomfort.  She noted exertional chest discomfort.  She underwent left heart cath on 09/20/2020 by Dr. Allyson Sabal.  Her catheterization was normal.  It was felt that her symptoms were related to reflux.  She was seen in follow-up by Dr. Allyson Sabal on 12/03/2021.  During that time she continued to do well.  Her chest pain had improved with treatment of her reflux.  Her blood pressure was well-controlled..  Her cholesterol was also well-managed.  She presents to the clinic today for follow-up evaluation and preoperative cardiac evaluation.  She states she has been noticing intermittent episodes of fast heart rate.  She reports episodes that are brief.  She  does note that her heart rate will go up to 120-130 then returned to normal.  She reports that when she has been admitted to the hospital she has always been told that she is dehydrated.  She reports adequate hydration at this time.  Case was reviewed with DOD.  I will order a 3-day cardiac event monitor, CBC, CMP, TSH, magnesium and have her avoid triggers for palpitations.  We will have her proceed to surgery and follow-up in 6 weeks.  Today she denies chest pain, shortness of breath, lower extremity edema, fatigue,  melena, hematuria, hemoptysis, diaphoresis, weakness, presyncope,  syncope, orthopnea, and PND.      Home Medications    Prior to Admission medications   Medication Sig Start Date End Date Taking? Authorizing Provider  acetaminophen (TYLENOL) 650 MG CR tablet Take 1,300 mg by mouth every 8 (eight) hours as needed for pain.    [provider]  amLODipine (NORVASC) 2.5 MG tablet Take 2.5 mg by mouth daily. 09/08/22   [provider]  Bioflavonoid Products (BIOFLEX PO) Take 1 tablet by mouth daily.    [provider]  Flax Oil-Fish Oil-Borage Oil (FISH-FLAX-BORAGE PO) Take 1 capsule by mouth daily.    [provider]  fluticasone (FLONASE) 50 MCG/ACT nasal spray Place 1 spray into both nostrils daily. Patient taking differently: Place 1 spray into both nostrils daily as needed for allergies. 07/11/21   Merrilee Jansky, MD  irbesartan (AVAPRO) 300 MG tablet Take 300 mg by mouth daily. 10/11/19   [provider]  isosorbide mononitrate (IMDUR) 30 MG 24 hr tablet Take 1/2 (one-half) tablet by mouth once daily 04/07/23   Runell Gess, MD  LORazepam (ATIVAN) 0.5 MG tablet Take 0.5 mg by mouth 2 (two) times daily as needed for anxiety. 02/11/16   [provider]  meclizine (ANTIVERT) 25 MG tablet Take 25 mg by mouth 3 (three) times daily as needed for dizziness. 04/27/20   [provider]  Multiple Minerals-Vitamins (CAL MAG ZINC +D3 PO) Take 1 tablet by mouth daily.    [provider]  multivitamin-lutein (OCUVITE-LUTEIN) CAPS Take 830 capsules by mouth daily.    [provider]  omeprazole (PRILOSEC) 40 MG capsule Take 40 mg by mouth daily. 02/06/16   [provider]  rOPINIRole (REQUIP) 1 MG tablet Take 1 tablet (1 mg total) by mouth 3 (three) times daily. Patient taking differently: Take 1-2 mg by mouth See admin instructions. Take 1 mg by mouth in the morning & take 2 mg at night. 10/20/18   Patel, Roxana Hires K, DO  simvastatin (ZOCOR) 20 MG tablet Take 20 mg by mouth every  evening. 02/06/16   [provider]    Family History    Family History  Problem Relation Age of Onset   Heart attack Mother    Depression Mother    Stroke Mother    Diabetes Mother    Heart attack Father    COPD Sister    COPD Sister    COPD Brother    COPD Brother    Breast cancer Maternal Aunt 60   Restless legs syndrome Daughter    Restless legs syndrome Son    Restless legs syndrome Son    She indicated that her mother is deceased. She indicated that her father is deceased. She indicated that all of her three sisters are deceased. She indicated that all of her three brothers are deceased. She indicated that her daughter is alive. She indicated  that both of her sons are alive. She indicated that the status of her maternal aunt is unknown.  Social History    Social History   Socioeconomic History   Marital status: Widowed    Spouse name: Not on file   Number of children: Not on file   Years of education: Not on file   Highest education level: Not on file  Occupational History   Not on file  Tobacco Use   Smoking status: Former    Current packs/day: 0.00    Average packs/day: 0.5 packs/day for 20.0 years (10.0 ttl pk-yrs)    Types: Cigarettes    Start date: 10/06/1976    Quit date: 10/06/1996    Years since quitting: 26.8    Passive exposure: Past   Smokeless tobacco: Never  Vaping Use   Vaping status: Never Used  Substance and Sexual Activity   Alcohol use: No    Alcohol/week: 0.0 standard drinks of alcohol   Drug use: No   Sexual activity: Not Currently    Birth control/protection: Surgical    Comment: hyst  Other Topics Concern   Not on file  Social History Narrative   Lives alone in a one story home.  Has 3 children.     Semi retired.     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   Social Determinants of Health   Financial Resource Strain: Not on file  Food Insecurity: No Food Insecurity (07/08/2023)   Hunger Vital Sign    Worried  About Running Out of Food in the Last Year: Never true    Ran Out of Food in the Last Year: Never true  Transportation Needs: No Transportation Needs (07/08/2023)   PRAPARE - Administrator, Civil Service (Medical): No    Lack of Transportation (Non-Medical): No  Physical Activity: Not on file  Stress: Not on file  Social Connections: Not on file  Intimate Partner Violence: Not At Risk (07/08/2023)   Humiliation, Afraid, Rape, and Kick questionnaire    Fear of Current or Ex-Partner: No    Emotionally Abused: No    Physically Abused: No    Sexually Abused: No     Review of Systems    General:  No chills, fever, night sweats or weight changes.  Cardiovascular:  No chest pain, dyspnea on exertion, edema, orthopnea, palpitations, paroxysmal nocturnal dyspnea. Dermatological: No rash, lesions/masses Respiratory: No cough, dyspnea Urologic: No hematuria, dysuria Abdominal:   No nausea, vomiting, diarrhea, bright red blood per rectum, melena, or hematemesis Neurologic:  No visual changes, wkns, changes in mental status. All other systems reviewed and are otherwise negative except as noted above.  Physical Exam    VS:  BP 116/62 (BP Location: Left Arm, Patient Position: Sitting, Cuff Size: Normal)   Pulse 90   Ht 5\' 2"  (1.575 m)   Wt 148 lb 12.8 oz (67.5 kg)   SpO2 96%   BMI 27.22 kg/m  , BMI Body mass index is 27.22 kg/m. GEN: Well nourished, well developed, in no acute distress. HEENT: normal. Neck: Supple, no JVD, carotid bruits, or masses. Cardiac: RRR, no murmurs, rubs, or gallops. No clubbing, cyanosis, edema.  Radials/DP/PT 2+ and equal bilaterally.  Respiratory:  Respirations regular and unlabored, clear to auscultation bilaterally. GI: Soft, nontender, nondistended, BS + x 4. MS: no deformity or atrophy. Skin: warm and dry, no rash. Neuro:  Strength and sensation are intact. Psych: Normal affect.  Accessory Clinical Findings  Recent Labs: 01/12/2023:  Magnesium 2.0 06/02/2023: ALT 19; BUN 32; Creatinine, Ser 1.91; Hemoglobin 11.0; Platelets 244; Potassium 4.3; Sodium 133   Recent Lipid Panel No results found for: "CHOL", "TRIG", "HDL", "CHOLHDL", "VLDL", "LDLCALC", "LDLDIRECT"       ECG personally reviewed by me today- EKG Interpretation Date/Time:  Wednesday August 05 2023 09:00:54 EDT Ventricular Rate:  74 PR Interval:  134 QRS Duration:  128 QT Interval:  420 QTC Calculation: 466 R Axis:   -21  Text Interpretation: Normal sinus rhythm Right bundle branch block When compared with ECG of 02-Jun-2023 14:31, PREVIOUS ECG IS PRESENT Confirmed by Edd Fabian [id number]) on 08/05/2023 9:18:05 AM    Cardiac catheterization 09/20/2020  The left ventricular systolic function is normal. LV end diastolic pressure is normal. The left ventricular ejection fraction is 55-65% by visual estimate.   ALEXANDREA CLENNON is a 82 y.o. female      [id number] LOCATION:  FACILITY: [facility]  PHYSICIAN: Nanetta Batty, M.D. 01/11/1941     DATE OF PROCEDURE:  09/20/2020   DATE OF DISCHARGE:        CARDIAC CATHETERIZATION        History obtained from chart review.NASHIA WALKER is a 82 y.o. 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  She was referred by Dr. Clovis Riley, her primary provider, for evaluation of exertional chest pain.  Her risk factor profile is notable for 15 to 20 pack years of tobacco abuse having quit 25 years ago, treated hypertension and hyperlipidemia.  Both of her parents died of myocardial infarction's.  She is never had a heart attack or stroke.  She does have RLS and GERD.  She is complained of new onset exertional chest pain for last 3 to 4 months that occurs with walking associated shortness of breath and upper extremity radiation.  She presents today for outpatient diagnostic coronary angiography to define  her anatomy and physiology rule out an ischemic etiology.     IMPRESSION: Ms Lindalee has normal coronary arteries and normal LV function.  I believe her chest pain and other symptoms are noncardiac.  The sheath was removed and a TR band was placed on the right wrist to achieve patent hemostasis.  The patient left the lab in stable condition.  She will be discharged home today as an outpatient and will see back in the office in several weeks for follow-up.   Nanetta Batty. MD, [facility] 09/20/2020 1:27 PM      Assessment & Plan   1.  Palpitations-EKG today shows normal sinus rhythm with right bundle branch block 74 bpm.  She reports 4-5 episodes of brief palpitations through the day with heart rates in the 120-130 range.  Case was reviewed with DOD. Order CBC, CMP, TSH, magnesium 3-day cardiac event monitor  Chest discomfort-denies recent episodes of arm neck back or chest discomfort.  Previously noted to have reflux type discomfort.  Underwent reassuring cardiac catheterization 12/21. Heart healthy low-sodium diet Continue current medical therapy Maintain physical activity  Essential hypertension-BP today 116/62. Maintain blood pressure log Heart healthy low-sodium diet  Hyperlipidemia-LDL 70 on 05/01/2023. High-fiber diet Continue with his simvastatin Follows with PCP  Preop cardiac evaluation-radioactive seed guided right breast lumpectomy, Dr. Abigail Miyamoto, 08/11/2023    Primary Cardiologist: Dr. Allyson Sabal  Chart reviewed as part of pre-operative protocol coverage. Given past medical history and time since last visit, based  on ACC/AHA guidelines, JAKIYA SWOVELAND would be at acceptable risk for the planned procedure without further cardiovascular testing.   Her RCRI is low risk, 0.4% risk of major cardiac event.  She is able to complete greater than 4 METS of physical activity.  Patient was advised that if she develops new symptoms prior to surgery to contact our office to arrange a  follow-up appointment.  He verbalized understanding.    Disposition: Follow-up with Dr. Allyson Sabal or me in 6 weeks.   Thomasene Ripple. Jake Fuhrmann NP-C     08/05/2023, 9:18 AM [facility] [address] Office [phone] Fax [phone]    I spent 15 minutes examining this patient, reviewing medications, and using patient centered shared decision making involving her cardiac care.   I spent greater than 20 minutes reviewing her past medical history,  medications, and prior cardiac tests.

## 2023-08-04 NOTE — Progress Notes (Signed)
Surgical Instructions   Your procedure is scheduled on Tuesday August 11, 2023. Report to [facility] Main Entrance "A" at 5:30 A.M., then check in with the Admitting office. Any questions or running late day of surgery: call [phone]  Questions prior to your surgery date: call [phone], Monday-Friday, 8am-4pm. If you experience any cold or flu symptoms such as cough, fever, chills, shortness of breath, etc. between now and your scheduled surgery, please notify us at the above number.     Remember:  Do not eat after midnight the night before your surgery   You may drink clear liquids until 4:30 the morning of your surgery.   Clear liquids allowed are: Water, Non-Citrus Juices (without pulp), Carbonated Beverages, Clear Tea, Black Coffee Only (NO MILK, CREAM OR POWDERED CREAMER of any kind), and Gatorade.  Patient Instructions  The night before surgery:  No food after midnight. ONLY clear liquids after midnight  The day of surgery (if you do NOT have diabetes):  Drink ONE (1) Pre-Surgery Clear Ensure by 4:30 the morning of surgery. Drink in one sitting. Do not sip.  This drink was given to you during your hospital  pre-op appointment visit.  Nothing else to drink after completing the  Pre-Surgery Clear Ensure.         If you have questions, please contact your surgeon's office.    Take these medicines the morning of surgery with A SIP OF WATER   amLODipine (NORVASC)  isosorbide mononitrate (IMDUR)  omeprazole (PRILOSEC)   May take these medicines IF NEEDED: acetaminophen (TYLENOL)  fluticasone (FLONASE)  LORazepam (ATIVAN)  meclizine (ANTIVERT)   One week prior to surgery, STOP taking any Aspirin (unless otherwise instructed by your surgeon) Aleve, Naproxen, Ibuprofen, Motrin, Advil, Goody's, BC's, all herbal medications, fish oil, and non-prescription vitamins.                     Do NOT Smoke (Tobacco/Vaping) for 24 hours prior to your procedure.  If you use  a CPAP at night, you may bring your mask/headgear for your overnight stay.   You will be asked to remove any contacts, glasses, piercing's, hearing aid's, dentures/partials prior to surgery. Please bring cases for these items if needed.    Patients discharged the day of surgery will not be allowed to drive home, and someone needs to stay with them for 24 hours.  SURGICAL WAITING ROOM VISITATION Patients may have no more than 2 support people in the waiting area - these visitors may rotate.   Pre-op nurse will coordinate an appropriate time for 1 ADULT support person, who may not rotate, to accompany patient in pre-op.  Children under the age of 88 must have an adult with them who is not the patient and must remain in the main waiting area with an adult.  If the patient needs to stay at the hospital during part of their recovery, the visitor guidelines for inpatient rooms apply.  Please refer to the [facility] website for the visitor guidelines for any additional information.   If you received a COVID test during your pre-op visit  it is requested that you wear a mask when out in public, stay away from anyone that may not be feeling well and notify your surgeon if you develop symptoms. If you have been in contact with anyone that has tested positive in the last 10 days please notify you surgeon.      Pre-operative CHG Bathing Instructions   You can  play a key role in reducing the risk of infection after surgery. Your skin needs to be as free of germs as possible. You can reduce the number of germs on your skin by washing with CHG (chlorhexidine gluconate) soap before surgery. CHG is an antiseptic soap that kills germs and continues to kill germs even after washing.   DO NOT use if you have an allergy to chlorhexidine/CHG or antibacterial soaps. If your skin becomes reddened or irritated, stop using the CHG and notify one of our RNs at [phone].              TAKE A SHOWER THE NIGHT  BEFORE SURGERY AND THE DAY OF SURGERY    Please keep in mind the following:  DO NOT shave, including legs and underarms, 48 hours prior to surgery.   You may shave your face before/day of surgery.  Place clean sheets on your bed the night before surgery Use a clean washcloth (not used since being washed) for each shower. DO NOT sleep with pet's night before surgery.  CHG Shower Instructions:  Wash your face and private area with normal soap. If you choose to wash your hair, wash first with your normal shampoo.  After you use shampoo/soap, rinse your hair and body thoroughly to remove shampoo/soap residue.  Turn the water OFF and apply half the bottle of CHG soap to a CLEAN washcloth.  Apply CHG soap ONLY FROM YOUR NECK DOWN TO YOUR TOES (washing for 3-5 minutes)  DO NOT use CHG soap on face, private areas, open wounds, or sores.  Pay special attention to the area where your surgery is being performed.  If you are having back surgery, having someone wash your back for you may be helpful. Wait 2 minutes after CHG soap is applied, then you may rinse off the CHG soap.  Pat dry with a clean towel  Put on clean pajamas    Additional instructions for the day of surgery: DO NOT APPLY any lotions, deodorants or perfumes.   Do not wear jewelry or makeup Do not wear nail polish, gel polish, artificial nails, or any other type of covering on natural nails (fingers and toes) Do not bring valuables to the hospital. [facility] is not responsible for valuables/personal belongings. Put on clean/comfortable clothes.  Please brush your teeth.  Ask your nurse before applying any prescription medications to the skin.

## 2023-08-05 ENCOUNTER — Encounter: Payer: Self-pay | Admitting: General Practice

## 2023-08-05 ENCOUNTER — Encounter (HOSPITAL_COMMUNITY)
Admission: RE | Admit: 2023-08-05 | Discharge: 2023-08-05 | Disposition: A | Discharge status: Home / Self Care | Payer: Medicare HMO | Source: Ambulatory Visit | Attending: Surgery | Admitting: Surgery

## 2023-08-05 ENCOUNTER — Encounter (HOSPITAL_COMMUNITY): Payer: Self-pay | Admitting: Surgery

## 2023-08-05 ENCOUNTER — Other Ambulatory Visit: Payer: Self-pay

## 2023-08-05 ENCOUNTER — Ambulatory Visit: Payer: Medicare HMO | Attending: General Practice | Admitting: General Practice

## 2023-08-05 ENCOUNTER — Ambulatory Visit (INDEPENDENT_AMBULATORY_CARE_PROVIDER_SITE_OTHER): Payer: Medicare HMO

## 2023-08-05 ENCOUNTER — Other Ambulatory Visit: Payer: Self-pay | Admitting: General Practice

## 2023-08-05 VITALS — BP 116/62 | HR 90 | Ht 62.0 in | Wt 148.8 lb

## 2023-08-05 VITALS — BP 139/63 | HR 79 | Temp 98.3°F | Resp 17 | Ht 62.0 in | Wt 150.0 lb

## 2023-08-05 DIAGNOSIS — E785 Hyperlipidemia, unspecified: Secondary | ICD-10-CM | POA: Diagnosis not present

## 2023-08-05 DIAGNOSIS — Z01810 Encounter for preprocedural cardiovascular examination: Secondary | ICD-10-CM

## 2023-08-05 DIAGNOSIS — K219 Gastro-esophageal reflux disease without esophagitis: Secondary | ICD-10-CM | POA: Insufficient documentation

## 2023-08-05 DIAGNOSIS — R079 Chest pain, unspecified: Secondary | ICD-10-CM

## 2023-08-05 DIAGNOSIS — Z01812 Encounter for preprocedural laboratory examination: Secondary | ICD-10-CM | POA: Insufficient documentation

## 2023-08-05 DIAGNOSIS — E7800 Pure hypercholesterolemia, unspecified: Secondary | ICD-10-CM | POA: Insufficient documentation

## 2023-08-05 DIAGNOSIS — Z87891 Personal history of nicotine dependence: Secondary | ICD-10-CM | POA: Insufficient documentation

## 2023-08-05 DIAGNOSIS — I4510 Unspecified right bundle-branch block: Secondary | ICD-10-CM | POA: Diagnosis not present

## 2023-08-05 DIAGNOSIS — K449 Diaphragmatic hernia without obstruction or gangrene: Secondary | ICD-10-CM | POA: Insufficient documentation

## 2023-08-05 DIAGNOSIS — D0511 Intraductal carcinoma in situ of right breast: Secondary | ICD-10-CM | POA: Insufficient documentation

## 2023-08-05 DIAGNOSIS — R002 Palpitations: Secondary | ICD-10-CM

## 2023-08-05 DIAGNOSIS — I10 Essential (primary) hypertension: Secondary | ICD-10-CM

## 2023-08-05 DIAGNOSIS — Z01818 Encounter for other preprocedural examination: Secondary | ICD-10-CM

## 2023-08-05 DIAGNOSIS — I2510 Atherosclerotic heart disease of native coronary artery without angina pectoris: Secondary | ICD-10-CM | POA: Diagnosis not present

## 2023-08-05 NOTE — Progress Notes (Unsigned)
Enrolled for [employer] to mail a ZIO XT long term holter monitor to the patients address on file.   Dr. Berry to read. 

## 2023-08-05 NOTE — Patient Instructions (Signed)
Medication Instructions:  No changes *If you need a refill on your cardiac medications before your next appointment, please call your pharmacy*   Lab Work: Today: CBC, CMP, TSH, and Mag If you have labs (blood work) drawn today and your tests are completely normal, you will receive your results only by: MyChart Message (if you have MyChart) OR A paper copy in the mail If you have any lab test that is abnormal or we need to change your treatment, we will call you to review the results.   Testing/Procedures:  Christena Deem- Long Term Monitor Instructions  Your physician has requested you wear a ZIO patch monitor for 3 days.  This is a single patch monitor. [employer] supplies one patch monitor per enrollment. Additional stickers are not available. Please do not apply patch if you will be having a Nuclear Stress Test,  Echocardiogram, Cardiac CT, MRI, or Chest Xray during the period you would be wearing the  monitor. The patch cannot be worn during these tests. You cannot remove and re-apply the  ZIO XT patch monitor.  Your ZIO patch monitor will be mailed 3 day USPS to your address on file. It may take 3-5 days  to receive your monitor after you have been enrolled.  Once you have received your monitor, please review the enclosed instructions. Your monitor  has already been registered assigning a specific monitor serial # to you.  Billing and Patient Assistance Program Information  We have supplied [employer] with any of your insurance information on file for billing purposes. [employer] offers a sliding scale Patient Assistance Program for patients that do not have  insurance, or whose insurance does not completely cover the cost of the ZIO monitor.  You must apply for the Patient Assistance Program to qualify for this discounted rate.  To apply, please call [employer] at [phone], select option 4, select option 2, ask to apply for  Patient Assistance Program. Meredeth Ide will ask your household  income, and how many people  are in your household. They will quote your out-of-pocket cost based on that information.  [employer] will also be able to set up a 50-month, interest-free payment plan if needed.  Applying the monitor   Shave hair from upper left chest.  Hold abrader disc by orange tab. Rub abrader in 40 strokes over the upper left chest as  indicated in your monitor instructions.  Clean area with 4 enclosed alcohol pads. Let dry.  Apply patch as indicated in monitor instructions. Patch will be placed under collarbone on left  side of chest with arrow pointing upward.  Rub patch adhesive wings for 2 minutes. Remove white label marked "1". Remove the white  label marked "2". Rub patch adhesive wings for 2 additional minutes.  While looking in a mirror, press and release button in center of patch. A small green light will  flash 3-4 times. This will be your only indicator that the monitor has been turned on.  Do not shower for the first 24 hours. You may shower after the first 24 hours.  Press the button if you feel a symptom. You will hear a small click. Record Date, Time and  Symptom in the Patient Logbook.  When you are ready to remove the patch, follow instructions on the last 2 pages of Patient  Logbook. Stick patch monitor onto the last page of Patient Logbook.  Place Patient Logbook in the blue and white box. Use locking tab on box and tape box closed  securely.  The blue and white box has prepaid postage on it. Please place it in the mailbox as  soon as possible. Your physician should have your test results approximately 7 days after the  monitor has been mailed back to [facility].  Call [facility] Customer Care at [phone] if you have questions regarding  your ZIO XT patch monitor. Call them immediately if you see an orange light blinking on your  monitor.  If your monitor falls off in less than 4 days, contact our Monitor department at [phone].  If your  monitor becomes loose or falls off after 4 days call [employer] at [phone] for  suggestions on securing your monitor   Follow-Up: At [facility], you and your health needs are our priority.  As part of our continuing mission to provide you with exceptional heart care, we have created designated Provider Care Teams.  These Care Teams include your primary Cardiologist (physician) and Advanced Practice Providers (APPs -  Physician Assistants and Nurse Practitioners) who all work together to provide you with the care you need, when you need it.  We recommend signing up for the patient portal called "MyChart".  Sign up information is provided on this After Visit Summary.  MyChart is used to connect with patients for Virtual Visits (Telemedicine).  Patients are able to view lab/test results, encounter notes, upcoming appointments, etc.  Non-urgent messages can be sent to your provider as well.   To learn more about what you can do with MyChart, go to [email or web address].    Your next appointment:   6 week(s)  Provider:   Edd Fabian, FNP  Other Instructions Avoid common stressors for palpitations such as excess alcohol, excess caffeine, and chocolate. And maintain hydration.

## 2023-08-05 NOTE — Progress Notes (Signed)
PCP - Dr. Debroah Loop Cardiologist - Edd Fabian, NP  PPM/ICD - denies Device Orders - na Rep Notified - na  Chest x-ray - 06/22/2023 EKG - 06/02/2023 Stress Test -  ECHO -  Cardiac Cath - 09/20/2020  Sleep Study - denies CPAP - na  Non-diabetic  Blood Thinner Instructions: denies Aspirin Instructions:denis  ERAS Protcol - Ensure until 0430  COVID TEST- na  Anesthesia review: Yes. Seed placement, HTN, angina   Patient denies shortness of breath, fever, cough and chest pain at PAT appointment   All instructions explained to the patient, with a verbal understanding of the material. Patient agrees to go over the instructions while at home for a better understanding. Patient also instructed to self quarantine after being tested for COVID-19. The opportunity to ask questions was provided.

## 2023-08-06 LAB — COMPREHENSIVE METABOLIC PANEL
ALT: 11 [IU]/L (ref 0–32)
AST: 14 [IU]/L (ref 0–40)
Albumin: 3.8 g/dL (ref 3.8–4.8)
Alkaline Phosphatase: 65 [IU]/L (ref 44–121)
BUN/Creatinine Ratio: 20 (ref 12–28)
BUN: 21 mg/dL (ref 8–27)
Bilirubin Total: 0.2 mg/dL (ref 0.0–1.2)
CO2: 27 mmol/L (ref 20–29)
Calcium: 10 mg/dL (ref 8.7–10.3)
Chloride: 104 mmol/L (ref 96–106)
Creatinine, Ser: 1.03 mg/dL — ABNORMAL HIGH (ref 0.57–1.00)
Globulin, Total: 2.6 g/dL (ref 1.5–4.5)
Glucose: 96 mg/dL (ref 70–99)
Potassium: 4.4 mmol/L (ref 3.5–5.2)
Sodium: 141 mmol/L (ref 134–144)
Total Protein: 6.4 g/dL (ref 6.0–8.5)
eGFR: 55 mL/min/{1.73_m2} — ABNORMAL LOW (ref >59)

## 2023-08-06 LAB — CBC
Hematocrit: 39 % (ref 34.0–46.6)
Hemoglobin: 12.3 g/dL (ref 11.1–15.9)
MCH: 30.1 pg (ref 26.6–33.0)
MCHC: 31.5 g/dL (ref 31.5–35.7)
MCV: 96 fL (ref 79–97)
Platelets: 311 10*3/uL (ref 150–450)
RBC: 4.08 x10E6/uL (ref 3.77–5.28)
RDW: 11.9 % (ref 11.7–15.4)
WBC: 6.3 10*3/uL (ref 3.4–10.8)

## 2023-08-06 LAB — TSH: TSH: 1.13 u[IU]/mL (ref 0.450–4.500)

## 2023-08-06 LAB — MAGNESIUM: Magnesium: 2.3 mg/dL (ref 1.6–2.3)

## 2023-08-06 NOTE — Progress Notes (Signed)
Anesthesia Chart Review:   Case: [id number] Date/Time: 08/11/23 0715   Procedure: RADIOACTIVE SEED GUIDED RIGHT BREAST LUMPECTOMY (Right) - LMA   Anesthesia type: General   Pre-op diagnosis: DUCTAL CARCINOMA IN SITU RIGHT BREAST   [facility] / [facility]   Surgeons: Abigail Miyamoto, MD       DISCUSSION: Patient is an 82 year old female scheduled for the above procedure.  History includes former smoker, HTN, hypercholesterolemia, GERD, exertional dyspnea, breast cancer (left DCIS, s/p left breast lumpectomy 07/16/22, re-excision of margin 08/13/22, s/p radiation through 10/14/22; right DCIS 06/2023), chest pain (normal coronaries 09/2020 [facility]).  She had preoperative cardiology evaluation by Edd Fabian, NP on 08/05/23. She reported intermittent tachycardia up to 120-130 bpm. EKG showed NSR, RBBB. Normal coronaries by [facility] 09/2020 for chest pain evaluation. Symptoms improved with treatment of GERD. Palpitations brief. He discussed with DOD. Mg, TSH, CMP, and CBC ordered which were normal except for minor abnormalities in Creatinine 1.03, eGFR 55. A 3 day cardiac event monitor also planned and will follow around six weeks after surgery. In regards to preoperative risk assessment: "Given past medical history and time since last visit, based on ACC/AHA guidelines, KAIDYNN WHITENER would be at acceptable risk for the planned procedure without further cardiovascular testing.    Her RCRI is low risk, 0.4% risk of major cardiac event.  She is able to complete greater than 4 METS of physical activity."   RSL is scheduled for 08/10/23.  Anesthesia team to evaluate on the day of surgery.   VS: BP 139/63   Pulse 79   Temp 36.8 C   Resp 17   Ht 5\' 2"  (1.575 m)   Wt 68 kg   SpO2 97%   BMI 27.44 kg/m    PROVIDERS: Debroah Loop, DO is PCP  [name], MD is HEM-ONC Antony Blackbird, MD is RAD-ONC Pollyann Savoy, MD is a rheumatologist Nanetta Batty, MD is cardiologist   LABS: Most  recent labs results in [facility] include: Lab Results  Component Value Date   WBC 6.3 08/05/2023   HGB 12.3 08/05/2023   HCT 39.0 08/05/2023   PLT 311 08/05/2023   GLUCOSE 96 08/05/2023   ALT 11 08/05/2023   AST 14 08/05/2023   NA 141 08/05/2023   K 4.4 08/05/2023   CL 104 08/05/2023   CREATININE 1.03 (H) 08/05/2023   BUN 21 08/05/2023   CO2 27 08/05/2023   TSH 1.130 08/05/2023    IMAGES: CXR 06/11/23: IMPRESSION: 1. No active cardiopulmonary disease. 2. Old granulomatous disease.   CTA Chest 01/15/23: IMPRESSION: - There is no evidence of pulmonary artery embolism. There is no focal pulmonary consolidation. - Coronary artery disease. Slight prominence of interstitial markings in mid and lower lung fields may suggest scarring. Small hiatal hernia.   EKG: 07/09/23: Normal sinus rhythm Right bundle branch block When compared with ECG of 02-Jun-2023 14:31, PREVIOUS ECG IS PRESENT Confirmed by Edd Fabian [phone]) on 08/05/2023 9:18:05 AM   CV: Long term monitor 08/05/23: In process.   Cardiac cath 09/20/20: - The left ventricular systolic function is normal. - LV end diastolic pressure is normal. - The left ventricular ejection fraction is 55-65% by visual estimate. IMPRESSION: Ms Alicia [name] has normal coronary arteries and normal LV function.  I believe her chest pain and other symptoms are noncardiac.     US Carotid 01/01/16: MPRESSION: - Mild plaque formation is noted in the proximal portions of both internal carotid arteries consistent with less than  50% diameter stenosis based on ultrasound and Doppler criteria. - Incidental note is made of bilateral thyroid nodules, with the largest being 1.7 cm solid nodule in the left thyroid lobe. Dedicated thyroid ultrasound is recommended for further evaluation. - S/p Thyroid US with FNA: THYROID, LEFT LOWER POLE, FINE NEEDLE ASPIRATION (SPECIMEN 1 OF 1, COLLECTED ON 01/23/16): FINDINGS CONSISTENT WITH BENIGN THYROID NODULE  (BETHESDA CATEGORY II).  Past Medical History:  Diagnosis Date   Anginal pain (HCC)    Anxiety 2014   Bursitis    Cancer (HCC) 06/2022   left breast DCIS   Dyspnea    with exertion   GERD (gastroesophageal reflux disease) 2008   History of radiation therapy    Left breast- 09/17/22-10/14/22- Dr. Antony Blackbird   Hypercholesteremia 2003   Hypertension 2003   Insomnia 1988   Kidney stone    Osteoarthritis 2008   hands, knees, right hip   Osteopenia 2002   Restless leg syndrome 2000   RLS (restless legs syndrome) 03/28/2016   Vertigo 2002    Past Surgical History:  Procedure Laterality Date   ABDOMINAL HYSTERECTOMY     APPENDECTOMY     BLEPHAROPLASTY  2019   BREAST LUMPECTOMY WITH RADIOACTIVE SEED LOCALIZATION Left 07/16/2022   Procedure: LEFT BREAST LUMPECTOMY WITH RADIOACTIVE SEED LOCALIZATION;  Surgeon: Abigail Miyamoto, MD;  Location: [facility];  Service: General;  Laterality: Left;   CATARACT EXTRACTION Right 02/2010   CHOLECYSTECTOMY     COLONOSCOPY  2015   results normal   LEFT HEART CATH AND CORONARY ANGIOGRAPHY N/A 09/20/2020   Procedure: LEFT HEART CATH AND CORONARY ANGIOGRAPHY;  Surgeon: Runell Gess, MD;  [facility];  Service: Cardiovascular;  Laterality: N/A;   RE-EXCISION OF BREAST LUMPECTOMY Left 08/13/2022   Procedure: RE-EXCISION OF LEFT BREAST CANCER;  Surgeon: Abigail Miyamoto, MD;  Location: [facility];  Service: General;  Laterality: Left;   ROTATOR CUFF REPAIR Right     MEDICATIONS:  acetaminophen (TYLENOL) 650 MG CR tablet   amLODipine (NORVASC) 2.5 MG tablet   Bioflavonoid Products (BIOFLEX PO)   Biotin 5000 MCG TABS   Calcium Magnesium Zinc 333-133-5 MG TABS   COMIRNATY syringe   cyanocobalamin (VITAMIN B12) 1000 MCG tablet   famotidine-calcium carbonate-magnesium hydroxide (PEPCID COMPLETE) 10-800-165 MG chewable tablet   Flax Oil-Fish Oil-Borage Oil (FISH-FLAX-BORAGE PO)   fluticasone  (FLONASE) 50 MCG/ACT nasal spray   irbesartan (AVAPRO) 300 MG tablet   isosorbide mononitrate (IMDUR) 30 MG 24 hr tablet   LORazepam (ATIVAN) 0.5 MG tablet   meclizine (ANTIVERT) 25 MG tablet   Misc Natural Products (OSTEO BI-FLEX TRIPLE STRENGTH) TABS   Multiple Minerals-Vitamins (CAL MAG ZINC +D3 PO)   multivitamin-lutein (OCUVITE-LUTEIN) CAPS   omeprazole (PRILOSEC) 40 MG capsule   rOPINIRole (REQUIP) 1 MG tablet   simvastatin (ZOCOR) 20 MG tablet    sodium chloride flush (NS) 0.9 % injection 3 mL    Shonna Chock, PA-C Surgical Short Stay/Anesthesiology [facility] Phone [phone] [facility] Phone [phone] 08/06/2023 2:12 PM

## 2023-08-06 NOTE — Anesthesia Preprocedure Evaluation (Addendum)
Anesthesia Evaluation  Patient identified by MRN, date of birth, ID band Patient awake    Reviewed: Allergy & Precautions, NPO status , Patient's Chart, lab work & pertinent test results  Airway Mallampati: II  TM Distance: >3 FB Neck ROM: Full    Dental no notable dental hx. (+) Dental Advisory Given, Upper Dentures   Pulmonary former smoker   Pulmonary exam normal breath sounds clear to auscultation       Cardiovascular hypertension, Pt. on medications Normal cardiovascular exam Rhythm:Regular Rate:Normal     Neuro/Psych   Anxiety        GI/Hepatic ,GERD  Medicated and Controlled,,  Endo/Other    Renal/GU      Musculoskeletal  (+) Arthritis , Osteoarthritis,    Abdominal   Peds  Hematology   Anesthesia Other Findings R Breast ca   All: see list  Reproductive/Obstetrics                              Anesthesia Physical Anesthesia Plan  ASA: 2  Anesthesia Plan: General   Post-op Pain Management: Tylenol PO (pre-op)* and Precedex   Induction:   PONV Risk Score and Plan: 3 and Treatment may vary due to age or medical condition, Propofol infusion, TIVA and Ondansetron  Airway Management Planned: LMA  Additional Equipment: None  Intra-op Plan:   Post-operative Plan:   Informed Consent: I have reviewed the patients History and Physical, chart, labs and discussed the procedure including the risks, benefits and alternatives for the proposed anesthesia with the patient or authorized representative who has indicated his/her understanding and acceptance.     Dental advisory given  Plan Discussed with:   Anesthesia Plan Comments: (PAT note written 08/06/2023 by Shonna Chock, PA-C.  LMA TIVA   )        Anesthesia Quick Evaluation

## 2023-08-10 DIAGNOSIS — Z853 Personal history of malignant neoplasm of breast: Secondary | ICD-10-CM | POA: Diagnosis not present

## 2023-08-10 NOTE — H&P (Signed)
REFERRING PHYSICIAN: Annye Rusk, * PROVIDER: Wayne Both, MD MRN: [id number] DOB: 1941-09-21  Subjective   Chief Complaint: New Consultation (R breast cancer)  History of Present Illness: Alicia [name] is a 82 y.o. female who is seen  as an office consultation for evaluation of New Consultation (R breast cancer)  This is an 82 year old female who I operated on almost a year ago for left breast DCIS. She had postoperative radiation therapy and is on tamoxifen. On her most recent mammograms earlier this month she was found to have calcifications in the right breast measuring 1.4 cm. She underwent a biopsy showing ductal carcinoma in situ. It was 95% ER positive and 90% PR positive. She is otherwise been doing well since I saw her last.   Review of Systems: A complete review of systems was obtained from the patient. I have reviewed this information and discussed as appropriate with the patient. See HPI as well for other ROS.  ROS   Medical History: Past Medical History:  Diagnosis Date  Anxiety  GERD (gastroesophageal reflux disease)  Hyperlipidemia  Hypertension   Patient Active Problem List  Diagnosis  Ductal carcinoma in situ (DCIS) of left breast   Past Surgical History:  Procedure Laterality Date  L heart cath and coronary angiography 09/20/2020  Re-excision of breast cancer Left 08/13/2022  Dr Magnus Ivan  APPENDECTOMY  Unknown date  BLEPHAROPLASTY  Unknown date  CATARACT EXTRACTION N/A  Unknown date  HYSTERECTOMY VAGINAL  Unknown date    Allergies  Allergen Reactions  Escitalopram Other (See Comments) and Unknown  GERD  Ibuprofen Other (See Comments)  GI UPSET  Lisinopril Cough and Unknown  Meloxicam Other (See Comments)  LEG PAIN  Nabumetone Other (See Comments)  GI UPSET  Omeprazole Other (See Comments)  ARM SPASM  Sertraline Other (See Comments)  INSOMNIA  Sulfa (Sulfonamide Antibiotics) Rash  Sulfasalazine Rash   Current  Outpatient Medications on File Prior to Visit  Medication Sig Dispense Refill  amLODIPine (NORVASC) 2.5 MG tablet Take 2.5 mg by mouth once daily  fluticasone propionate (FLONASE) 50 mcg/actuation nasal spray Place 1 spray into both nostrils once daily as needed  irbesartan (AVAPRO) 300 MG tablet Take 300 mg by mouth once daily  isosorbide mononitrate (IMDUR) 30 MG ER tablet Take 0.5 tablets by mouth once daily  LORazepam (ATIVAN) 0.5 MG tablet Take 1-1.5 tablets by mouth 2 (two) times daily as needed  meclizine (ANTIVERT) 25 mg tablet Take 1-2 tablets by mouth 3 (three) times daily as needed  omeprazole (PRILOSEC) 40 MG DR capsule Take 40 mg by mouth once daily  rOPINIRole (REQUIP) 1 MG tablet Take 1 mg by mouth 3 (three) times daily  tamoxifen (NOLVADEX) 10 MG tablet Take 10 mg by mouth once daily  celecoxib (CELEBREX) 200 MG capsule Take 200 mg by mouth once daily (Patient not taking: Reported on 06/26/2023)  cyclobenzaprine (FLEXERIL) 10 MG tablet Take 10 mg by mouth 3 (three) times daily as needed (Patient not taking: Reported on 06/26/2023)  metoprolol succinate (TOPROL-XL) 50 MG XL tablet Take 50 mg by mouth once daily (Patient not taking: Reported on 06/26/2023)  spironolactone (ALDACTONE) 25 MG tablet Take 25 mg by mouth once daily (Patient not taking: Reported on 06/26/2023)  traZODone (DESYREL) 50 MG tablet Take 50 mg by mouth at bedtime (Patient not taking: Reported on 06/26/2023)   No current facility-administered medications on file prior to visit.   Family History  Problem Relation Age of Onset  Diabetes Mother  Stroke Mother    Social History   Tobacco Use  Smoking Status Former  Current packs/day: 0.00  Types: Cigarettes  Quit date: 1998  Years since quitting: 26.7  Smokeless Tobacco Never    Social History   Socioeconomic History  Marital status: Widowed  Tobacco Use  Smoking status: Former  Current packs/day: 0.00  Types: Cigarettes  Quit date: 1998  Years  since quitting: 26.7  Smokeless tobacco: Never  Vaping Use  Vaping status: Never Used  Substance and Sexual Activity  Alcohol use: Never  Drug use: Never  Sexual activity: Defer   Objective:   Vitals:   Pulse: (!) 112  Temp: 36.8 C (98.2 F)  SpO2: 98%  Weight: 67.6 kg (149 lb)  Height: 154.9 cm (5\' 1" )  PainSc: 0-No pain   Body mass index is 28.15 kg/m.  Physical Exam   She appears well on examination.  No palpable breast masses and no axillary adenopathy. There are minimal radiation changes to her left breast  Labs, Imaging and Diagnostic Testing: I reviewed her mammograms, ultrasound, and pathology results  Assessment and Plan:   Diagnoses and all orders for this visit:  Neoplasm of right breast, primary tumor staging category Tis: ductal carcinoma in situ (DCIS)   Unfortunately she now has DCIS in the right breast with a history of recent treatment for DCIS in the left breast last year. Again, a radioactive seed guided right breast lumpectomy is recommended to remove this area. I discussed this with her and her daughter in detail. We will get her back into see medical and radiation oncology. She would like to hold on surgery until the end of October as she has a trip out of state plan with her daughter and is already on tamoxifen. I believe this is very reasonable. We again discussed the surgical procedure in detail. We discussed the risk which includes but is not limited to bleeding, infection, injury to surrounding structures, the need for further surgery if margins are positive, cardiopulmonary issues anesthesia, etc. We discussed postoperative recovery as well.

## 2023-08-11 ENCOUNTER — Ambulatory Visit (HOSPITAL_COMMUNITY)
Admission: RE | Admit: 2023-08-11 | Discharge: 2023-08-11 | Disposition: A | Discharge status: Home / Self Care | Payer: Medicare HMO | Attending: Surgery | Admitting: Surgery

## 2023-08-11 ENCOUNTER — Other Ambulatory Visit: Payer: Self-pay

## 2023-08-11 ENCOUNTER — Ambulatory Visit (HOSPITAL_COMMUNITY): Payer: Medicare HMO | Admitting: Vascular Surgery

## 2023-08-11 ENCOUNTER — Ambulatory Visit (HOSPITAL_BASED_OUTPATIENT_CLINIC_OR_DEPARTMENT_OTHER): Payer: Medicare HMO

## 2023-08-11 ENCOUNTER — Encounter (HOSPITAL_COMMUNITY): Payer: Self-pay | Admitting: Surgery

## 2023-08-11 ENCOUNTER — Encounter (HOSPITAL_COMMUNITY)
Admission: RE | Disposition: A | Discharge status: Home / Self Care | Payer: Self-pay | Source: Home / Self Care | Attending: Surgery

## 2023-08-11 DIAGNOSIS — Z1721 Progesterone receptor positive status: Secondary | ICD-10-CM | POA: Diagnosis not present

## 2023-08-11 DIAGNOSIS — D0511 Intraductal carcinoma in situ of right breast: Secondary | ICD-10-CM

## 2023-08-11 DIAGNOSIS — F419 Anxiety disorder, unspecified: Secondary | ICD-10-CM | POA: Insufficient documentation

## 2023-08-11 DIAGNOSIS — N6021 Fibroadenosis of right breast: Secondary | ICD-10-CM | POA: Diagnosis not present

## 2023-08-11 DIAGNOSIS — R921 Mammographic calcification found on diagnostic imaging of breast: Secondary | ICD-10-CM | POA: Diagnosis not present

## 2023-08-11 DIAGNOSIS — Z87891 Personal history of nicotine dependence: Secondary | ICD-10-CM | POA: Diagnosis not present

## 2023-08-11 DIAGNOSIS — Z170 Estrogen receptor positive status [ER+]: Secondary | ICD-10-CM | POA: Insufficient documentation

## 2023-08-11 DIAGNOSIS — N641 Fat necrosis of breast: Secondary | ICD-10-CM | POA: Diagnosis not present

## 2023-08-11 DIAGNOSIS — Z79810 Long term (current) use of selective estrogen receptor modulators (SERMs): Secondary | ICD-10-CM | POA: Insufficient documentation

## 2023-08-11 DIAGNOSIS — I10 Essential (primary) hypertension: Secondary | ICD-10-CM | POA: Diagnosis not present

## 2023-08-11 DIAGNOSIS — C50911 Malignant neoplasm of unspecified site of right female breast: Secondary | ICD-10-CM | POA: Diagnosis not present

## 2023-08-11 DIAGNOSIS — K219 Gastro-esophageal reflux disease without esophagitis: Secondary | ICD-10-CM | POA: Diagnosis not present

## 2023-08-11 DIAGNOSIS — Z01818 Encounter for other preprocedural examination: Secondary | ICD-10-CM

## 2023-08-11 DIAGNOSIS — I70209 Unspecified atherosclerosis of native arteries of extremities, unspecified extremity: Secondary | ICD-10-CM | POA: Diagnosis not present

## 2023-08-11 HISTORY — PX: BREAST LUMPECTOMY WITH RADIOACTIVE SEED LOCALIZATION: SHX6424

## 2023-08-11 SURGERY — BREAST LUMPECTOMY WITH RADIOACTIVE SEED LOCALIZATION
Anesthesia: General | Site: Breast | Laterality: Right

## 2023-08-11 MED ORDER — DEXMEDETOMIDINE HCL IN NACL 80 MCG/20ML IV SOLN
INTRAVENOUS | Status: DC | PRN
  Administered 2023-08-11: 6 ug via INTRAVENOUS

## 2023-08-11 MED ORDER — PROPOFOL 10 MG/ML IV BOLUS
INTRAVENOUS | Status: DC | PRN
  Administered 2023-08-11: 125 ug/kg/min via INTRAVENOUS
  Administered 2023-08-11: 20 mg via INTRAVENOUS
  Administered 2023-08-11: 110 mg via INTRAVENOUS

## 2023-08-11 MED ORDER — FENTANYL CITRATE (PF) 250 MCG/5ML IJ SOLN
INTRAMUSCULAR | Status: AC
  Filled 2023-08-11: qty 5

## 2023-08-11 MED ORDER — CHLORHEXIDINE GLUCONATE CLOTH 2 % EX PADS: 6.0000 | MEDICATED_PAD | Freq: Once | CUTANEOUS | Status: DC

## 2023-08-11 MED ORDER — ACETAMINOPHEN 10 MG/ML IV SOLN
INTRAVENOUS | Status: AC
  Filled 2023-08-11: qty 100

## 2023-08-11 MED ORDER — ACETAMINOPHEN 500 MG PO TABS: 1000.0000 mg | ORAL_TABLET | Freq: Once | ORAL | Status: DC

## 2023-08-11 MED ORDER — PROPOFOL 10 MG/ML IV BOLUS
INTRAVENOUS | Status: AC
  Filled 2023-08-11: qty 20

## 2023-08-11 MED ORDER — CHLORHEXIDINE GLUCONATE CLOTH 2 % EX PADS
6.0000 | MEDICATED_PAD | Freq: Once | CUTANEOUS | Status: DC
Start: 2023-08-11 — End: 2023-08-11

## 2023-08-11 MED ORDER — FENTANYL CITRATE (PF) 100 MCG/2ML IJ SOLN: 25.0000 ug | INTRAMUSCULAR | Status: DC | PRN

## 2023-08-11 MED ORDER — CEFAZOLIN SODIUM-DEXTROSE 2-4 GM/100ML-% IV SOLN
2.0000 g | INTRAVENOUS | Status: AC
Start: 2023-08-11 — End: 2023-08-11
  Administered 2023-08-11: 2 g via INTRAVENOUS
  Filled 2023-08-11: qty 100

## 2023-08-11 MED ORDER — ENSURE PRE-SURGERY PO LIQD: 296.0000 mL | Freq: Once | ORAL | Status: DC

## 2023-08-11 MED ORDER — ONDANSETRON HCL 4 MG/2ML IJ SOLN
INTRAMUSCULAR | Status: AC
  Filled 2023-08-11: qty 2

## 2023-08-11 MED ORDER — EPHEDRINE SULFATE-NACL 50-0.9 MG/10ML-% IV SOSY
PREFILLED_SYRINGE | INTRAVENOUS | Status: DC | PRN
  Administered 2023-08-11: 10 mg via INTRAVENOUS

## 2023-08-11 MED ORDER — ACETAMINOPHEN 10 MG/ML IV SOLN: 1000.0000 mg | Freq: Once | INTRAVENOUS | Status: DC | PRN

## 2023-08-11 MED ORDER — BUPIVACAINE-EPINEPHRINE (PF) 0.25% -1:200000 IJ SOLN
INTRAMUSCULAR | Status: AC
  Filled 2023-08-11: qty 30

## 2023-08-11 MED ORDER — ORAL CARE MOUTH RINSE: 15.0000 mL | Freq: Once | OROMUCOSAL | Status: AC

## 2023-08-11 MED ORDER — DEXAMETHASONE SODIUM PHOSPHATE 10 MG/ML IJ SOLN
INTRAMUSCULAR | Status: AC
  Filled 2023-08-11: qty 1

## 2023-08-11 MED ORDER — PHENYLEPHRINE HCL-NACL 20-0.9 MG/250ML-% IV SOLN
INTRAVENOUS | Status: DC | PRN
  Administered 2023-08-11: 50 ug/min via INTRAVENOUS

## 2023-08-11 MED ORDER — CHLORHEXIDINE GLUCONATE 0.12 % MT SOLN
15.0000 mL | Freq: Once | OROMUCOSAL | Status: AC
Start: 2023-08-11 — End: 2023-08-11
  Administered 2023-08-11: 15 mL via OROMUCOSAL
  Filled 2023-08-11: qty 15

## 2023-08-11 MED ORDER — FENTANYL CITRATE (PF) 250 MCG/5ML IJ SOLN
INTRAMUSCULAR | Status: DC | PRN
  Administered 2023-08-11 (×3): 50 ug via INTRAVENOUS

## 2023-08-11 MED ORDER — LIDOCAINE 2% (20 MG/ML) 5 ML SYRINGE
INTRAMUSCULAR | Status: DC | PRN
  Administered 2023-08-11: 80 mg via INTRAVENOUS

## 2023-08-11 MED ORDER — ONDANSETRON HCL 4 MG/2ML IJ SOLN: 4.0000 mg | Freq: Once | INTRAMUSCULAR | Status: DC | PRN

## 2023-08-11 MED ORDER — BUPIVACAINE-EPINEPHRINE 0.25% -1:200000 IJ SOLN
INTRAMUSCULAR | Status: DC | PRN
  Administered 2023-08-11: 15 mL

## 2023-08-11 MED ORDER — PHENYLEPHRINE 80 MCG/ML (10ML) SYRINGE FOR IV PUSH (FOR BLOOD PRESSURE SUPPORT)
PREFILLED_SYRINGE | INTRAVENOUS | Status: DC | PRN
  Administered 2023-08-11: 160 ug via INTRAVENOUS

## 2023-08-11 MED ORDER — TRAMADOL HCL 50 MG PO TABS
50.0000 mg | ORAL_TABLET | Freq: Once | ORAL | Status: AC
  Administered 2023-08-11: 50 mg via ORAL

## 2023-08-11 MED ORDER — ACETAMINOPHEN 10 MG/ML IV SOLN
INTRAVENOUS | Status: DC | PRN
  Administered 2023-08-11: 1000 mg via INTRAVENOUS

## 2023-08-11 MED ORDER — SODIUM CHLORIDE 0.9 % IV SOLN: INTRAVENOUS | Status: DC | PRN

## 2023-08-11 MED ORDER — PHENYLEPHRINE 80 MCG/ML (10ML) SYRINGE FOR IV PUSH (FOR BLOOD PRESSURE SUPPORT)
PREFILLED_SYRINGE | INTRAVENOUS | Status: AC
  Filled 2023-08-11: qty 10

## 2023-08-11 MED ORDER — TRAMADOL HCL 50 MG PO TABS: 50.0000 mg | ORAL_TABLET | Freq: Four times a day (QID) | ORAL | 0 refills | Status: DC | PRN

## 2023-08-11 MED ORDER — 0.9 % SODIUM CHLORIDE (POUR BTL) OPTIME
TOPICAL | Status: DC | PRN
  Administered 2023-08-11: 1000 mL

## 2023-08-11 MED ORDER — ONDANSETRON HCL 4 MG/2ML IJ SOLN
INTRAMUSCULAR | Status: DC | PRN
  Administered 2023-08-11: 4 mg via INTRAVENOUS

## 2023-08-11 MED ORDER — LIDOCAINE 2% (20 MG/ML) 5 ML SYRINGE
INTRAMUSCULAR | Status: AC
  Filled 2023-08-11: qty 5

## 2023-08-11 MED ORDER — TRAMADOL HCL 50 MG PO TABS
ORAL_TABLET | ORAL | Status: AC
  Filled 2023-08-11: qty 1

## 2023-08-11 SURGICAL SUPPLY — 34 items
ADH SKN CLS APL DERMABOND .7 (GAUZE/BANDAGES/DRESSINGS) ×1
APL PRP STRL LF DISP 70% ISPRP (MISCELLANEOUS) ×1
APPLIER CLIP 9.375 MED OPEN (MISCELLANEOUS) ×1
APR CLP MED 9.3 20 MLT OPN (MISCELLANEOUS) ×1
BAG COUNTER SPONGE SURGICOUNT (BAG) ×2 IMPLANT
BAG SPNG CNTER NS LX DISP (BAG)
BINDER BREAST LRG (GAUZE/BANDAGES/DRESSINGS) IMPLANT
BINDER BREAST XLRG (GAUZE/BANDAGES/DRESSINGS) IMPLANT
CANISTER SUCT 3000ML PPV (MISCELLANEOUS) ×2 IMPLANT
CHLORAPREP W/TINT 26 (MISCELLANEOUS) ×2 IMPLANT
CLIP APPLIE 9.375 MED OPEN (MISCELLANEOUS) ×1 IMPLANT
COVER PROBE W GEL 5X96 (DRAPES) ×1 IMPLANT
COVER SURGICAL LIGHT HANDLE (MISCELLANEOUS) ×2 IMPLANT
DERMABOND ADVANCED .7 DNX12 (GAUZE/BANDAGES/DRESSINGS) ×1 IMPLANT
DEVICE DUBIN SPECIMEN MAMMOGRA (MISCELLANEOUS) ×1 IMPLANT
DRAPE CHEST BREAST 15X10 FENES (DRAPES) ×2 IMPLANT
ELECT CAUTERY BLADE 6.4 (BLADE) ×2 IMPLANT
ELECT REM PT RETURN 9FT ADLT (ELECTROSURGICAL) ×1
ELECTRODE REM PT RTRN 9FT ADLT (ELECTROSURGICAL) ×1 IMPLANT
GLOVE SURG SIGNA 7.5 PF LTX (GLOVE) ×2 IMPLANT
GOWN STRL REUS W/ TWL LRG LVL3 (GOWN DISPOSABLE) ×2 IMPLANT
GOWN STRL REUS W/ TWL XL LVL3 (GOWN DISPOSABLE) ×2 IMPLANT
GOWN STRL REUS W/TWL LRG LVL3 (GOWN DISPOSABLE) ×1
GOWN STRL REUS W/TWL XL LVL3 (GOWN DISPOSABLE) ×1
KIT BASIN OR (CUSTOM PROCEDURE TRAY) ×2 IMPLANT
KIT MARKER MARGIN INK (KITS) ×2 IMPLANT
NDL HYPO 25GX1X1/2 BEV (NEEDLE) ×1 IMPLANT
NEEDLE HYPO 25GX1X1/2 BEV (NEEDLE) ×1
NS IRRIG 1000ML POUR BTL (IV SOLUTION) IMPLANT
PACK GENERAL/GYN (CUSTOM PROCEDURE TRAY) ×2 IMPLANT
SUT MNCRL AB 4-0 PS2 18 (SUTURE) ×1 IMPLANT
SUT VIC AB 3-0 SH 18 (SUTURE) ×1 IMPLANT
SYR CONTROL 10ML LL (SYRINGE) ×1 IMPLANT
TOWEL GREEN STERILE FF (TOWEL DISPOSABLE) ×1 IMPLANT

## 2023-08-11 NOTE — Interval H&P Note (Signed)
History and Physical Interval Note:no change in H and P  08/11/2023 6:51 AM  Alicia [name]  has presented today for surgery, with the diagnosis of DUCTAL CARCINOMA IN SITU RIGHT BREAST.  The various methods of treatment have been discussed with the patient and family. After consideration of risks, benefits and other options for treatment, the patient has consented to  Procedure(s) with comments: RADIOACTIVE SEED GUIDED RIGHT BREAST LUMPECTOMY (Right) - LMA as a surgical intervention.  The patient's history has been reviewed, patient examined, no change in status, stable for surgery.  I have reviewed the patient's chart and labs.  Questions were answered to the patient's satisfaction.     Abigail Miyamoto

## 2023-08-11 NOTE — Discharge Instructions (Signed)
[facility] Office Phone Number [phone]  BREAST BIOPSY/ PARTIAL MASTECTOMY: POST OP INSTRUCTIONS  Always review your discharge instruction sheet given to you by the facility where your surgery was performed.  IF YOU HAVE DISABILITY OR FAMILY LEAVE FORMS, YOU MUST BRING THEM TO THE OFFICE FOR PROCESSING.  DO NOT GIVE THEM TO YOUR DOCTOR.  A prescription for pain medication may be given to you upon discharge.  Take your pain medication as prescribed, if needed.  If narcotic pain medicine is not needed, then you may take acetaminophen (Tylenol) or ibuprofen (Advil) as needed. Take your usually prescribed medications unless otherwise directed If you need a refill on your pain medication, please contact your pharmacy.  They will contact our office to request authorization.  Prescriptions will not be filled after 5pm or on week-ends. You should eat very light the first 24 hours after surgery, such as soup, crackers, pudding, etc.  Resume your normal diet the day after surgery. Most patients will experience some swelling and bruising in the breast.  Ice packs and a good support bra will help.  Swelling and bruising can take several days to resolve.  It is common to experience some constipation if taking pain medication after surgery.  Increasing fluid intake and taking a stool softener will usually help or prevent this problem from occurring.  A mild laxative (Milk of Magnesia or Miralax) should be taken according to package directions if there are no bowel movements after 48 hours. Unless discharge instructions indicate otherwise, you may remove your bandages 24-48 hours after surgery, and you may shower at that time.  You may have steri-strips (small skin tapes) in place directly over the incision.  These strips should be left on the skin for 7-10 days.  If your surgeon used skin glue on the incision, you may shower in 24 hours.  The glue will flake off over the next 2-3 weeks.  Any  sutures or staples will be removed at the office during your follow-up visit. ACTIVITIES:  You may resume regular daily activities (gradually increasing) beginning the next day.  Wearing a good support bra or sports bra minimizes pain and swelling.  You may have sexual intercourse when it is comfortable. You may drive when you no longer are taking prescription pain medication, you can comfortably wear a seatbelt, and you can safely maneuver your car and apply brakes. RETURN TO WORK:  ______________________________________________________________________________________ Bonita Quin should see your doctor in the office for a follow-up appointment approximately two weeks after your surgery.  Your doctor's nurse will typically make your follow-up appointment when she calls you with your pathology report.  Expect your pathology report 2-3 business days after your surgery.  You may call to check if you do not hear from [place] after three days. OTHER INSTRUCTIONS: YOU MAY REMOVE THE BINDER AND SHOWER STARTING TOMORROW THEN PUT THE BINDER BACK ON AND TRY TO WEAR IT FOR 2 TO 3 DAYS AND THEN WEAR WHAT IS MOST COMFORTABLE FOR YOU ICE PACK, TYLENOL ALSO FOR PAIN NO VIGOROUS ACTIVITY FOR ONE WEEK _______________________________________________________________________________________________ _____________________________________________________________________________________________________________________________________ _____________________________________________________________________________________________________________________________________ _____________________________________________________________________________________________________________________________________  WHEN TO CALL YOUR DOCTOR: Fever over 101.0 Nausea and/or vomiting. Extreme swelling or bruising. Continued bleeding from incision. Increased pain, redness, or drainage from the incision.  The clinic staff is available to answer your  questions during regular business hours.  Please don't hesitate to call and ask to speak to one of the nurses for clinical concerns.  If you have a medical emergency, go to  the nearest emergency room or call 911.  A surgeon from [facility] is always on call at the hospital.  For further questions, please visit [email or web address]

## 2023-08-11 NOTE — Transfer of Care (Signed)
Immediate Anesthesia Transfer of Care Note  Patient: Alicia [name]  Procedure(s) Performed: RADIOACTIVE SEED GUIDED RIGHT BREAST LUMPECTOMY (Right: Breast)  Patient Location: PACU  Anesthesia Type:General  Level of Consciousness: awake and drowsy  Airway & Oxygen Therapy: Patient Spontanous Breathing and Patient connected to face mask oxygen  Post-op Assessment: Report given to RN, Post -op Vital signs reviewed and stable, and Patient moving all extremities  Post vital signs: Reviewed and stable  Last Vitals:  Vitals Value Taken Time  BP 100/54 08/11/23 0819  Temp    Pulse 80 08/11/23 0820  Resp 10 08/11/23 0820  SpO2 100 % 08/11/23 0820  Vitals shown include unfiled device data.  Last Pain:  Vitals:   08/11/23 0619  TempSrc:   PainSc: 0-No pain      Patients Stated Pain Goal: 0 (08/11/23 4098)  Complications: No notable events documented.

## 2023-08-11 NOTE — Anesthesia Postprocedure Evaluation (Signed)
Anesthesia Post Note  Patient: Alicia [name]  Procedure(s) Performed: RADIOACTIVE SEED GUIDED RIGHT BREAST LUMPECTOMY (Right: Breast)     Patient location during evaluation: PACU Anesthesia Type: General Level of consciousness: awake and alert Pain management: pain level controlled Vital Signs Assessment: post-procedure vital signs reviewed and stable Respiratory status: spontaneous breathing, nonlabored ventilation, respiratory function stable and patient connected to nasal cannula oxygen Cardiovascular status: blood pressure returned to baseline and stable Postop Assessment: no apparent nausea or vomiting Anesthetic complications: no   No notable events documented.  Last Vitals:  Vitals:   08/11/23 0845 08/11/23 0900  BP: (!) 100/56 (!) 106/55  Pulse: 71 76  Resp: 16 18  Temp:  (!) 36.4 C  SpO2: 93% 95%    Last Pain:  Vitals:   08/11/23 0820  TempSrc:   PainSc: 0-No pain                 Trevor Iha

## 2023-08-11 NOTE — Op Note (Signed)
RADIOACTIVE SEED GUIDED RIGHT BREAST LUMPECTOMY  Procedure Note  Alicia [name] 08/11/2023   Pre-op Diagnosis: DUCTAL CARCINOMA IN SITU RIGHT BREAST     Post-op Diagnosis: same  Procedure(s): RADIOACTIVE SEED GUIDED RIGHT BREAST LUMPECTOMY  Surgeon(s): Abigail Miyamoto, MD  Anesthesia: General  Staff:  Circulator: Tawny Hopping, RN Scrub Person: Dorris Carnes  Estimated Blood Loss: Minimal               Specimens: sent to path  Indications: This is an 82 year old female with a prior history of left breast ductal carcinoma in situ who now presents with calcifications in the right breast.  Biopsy again showed DCIS.  The decision was made to proceed with a radioactive seed guided right breast lumpectomy  Procedure: The patient was brought to the operating room identified as a correct patient.  She was placed upon the operating table and general anesthesia was induced.  Her right breast was prepped and draped in the usual sterile fashion.  Using the neoprobe I located the radioactive seed at the 6 o'clock position of the right breast.  I anesthetized the skin at the edge of the areola with Marcaine and then made a circumareolar incision with a scalpel.  I then dissected just underneath the skin inferiorly toward the signal from the radioactive seed.  Once I got past the signal I then dissected down toward the chest wall.  With the cautery I then attempted to stay widely around the signal from the radioactive seed going all way down the chest wall circumferentially and then I completed the lumpectomy staying underneath the signal taking the tissue off of the chest wall.  Once the lumpectomy is completely removed I marked all margins with paint.  I performed an x-ray on the specimen confirming the radioactive seed and calcifications were in the specimen.  The specimen was then sent to pathology for evaluation.  The anterior margin is skin in the posterior margin is the chest wall.  I  achieved hemostasis with the cautery.  I placed surgical clips around the periphery of the lumpectomy cavity.  I anesthetized it further with Marcaine.  I then closed the subcutaneous tissue with interrupted 3-0 Vicryl sutures and closed the skin with a running 4-0 Monocryl.  Dermabond was then applied.  The patient tolerated the procedure well.  All the counts were correct at the end of the procedure.  The patient was then extubated in the operating room and taken in a stable condition to the recovery room.          Abigail Miyamoto   Date: 08/11/2023  Time: 8:14 AM

## 2023-08-11 NOTE — Anesthesia Procedure Notes (Signed)
Procedure Name: LMA Insertion Date/Time: 08/11/2023 7:35 AM  Performed by: Kayleen Memos, CRNAPre-anesthesia Checklist: Patient identified, Emergency Drugs available, Suction available and Patient being monitored Patient Re-evaluated:Patient Re-evaluated prior to induction Oxygen Delivery Method: Circle System Utilized Preoxygenation: Pre-oxygenation with 100% oxygen Induction Type: IV induction Ventilation: Mask ventilation without difficulty LMA: LMA inserted LMA Size: 4.0 Number of attempts: 1 Airway Equipment and Method: Bite block Placement Confirmation: positive ETCO2 Tube secured with: Tape Dental Injury: Teeth and Oropharynx as per pre-operative assessment

## 2023-08-12 ENCOUNTER — Encounter (HOSPITAL_COMMUNITY): Payer: Self-pay | Admitting: Surgery

## 2023-08-12 DIAGNOSIS — R002 Palpitations: Secondary | ICD-10-CM

## 2023-08-12 DIAGNOSIS — Z01810 Encounter for preprocedural cardiovascular examination: Secondary | ICD-10-CM

## 2023-08-13 LAB — SURGICAL PATHOLOGY

## 2023-08-17 ENCOUNTER — Encounter: Payer: Self-pay | Admitting: *Deleted

## 2023-08-19 DIAGNOSIS — R002 Palpitations: Secondary | ICD-10-CM | POA: Diagnosis not present

## 2023-08-19 DIAGNOSIS — Z01810 Encounter for preprocedural cardiovascular examination: Secondary | ICD-10-CM | POA: Diagnosis not present

## 2023-08-24 NOTE — Assessment & Plan Note (Signed)
06/19/2022: Screening detected distortion with calcifications in the left breast central 12 o'clock position measuring 1.9 cm.  No ultrasound correlate.  Axilla negative.  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 Followed by adjuvant radiation therapy 09/18/2022-10/14/2022 3. Followed by antiestrogen therapy with tamoxifen 5 years started 10/15/2022 4.  Right lumpectomy 08/10/2023: Grade 2 DCIS 1.7 cm, margins negative, ER 95%, PR 90%   Tamoxifen toxicities: Tolerating it fairly well without any problems or concerns. She had an interaction with tamoxifen and trazodone but she quit trazodone and her symptoms have improved.   Breast cancer surveillance: Mammograms 06/10/2023: [facility]: 1.4 cm linear calcifications right lower outer quadrant: Stereotactic biopsy: Intermediate grade DCIS ER 95%, PR 90% Right lumpectomy: 08/10/2023: Grade 2 DCIS 1.7 cm, margins negative, ER 95%, PR 90%  Treatment plan: Adjuvant radiation therapy followed by Adjuvant antiestrogen therapy

## 2023-08-25 ENCOUNTER — Inpatient Hospital Stay: Payer: Medicare HMO | Attending: Hematology and Oncology | Admitting: Hematology and Oncology

## 2023-08-25 VITALS — BP 142/62 | HR 93 | Temp 98.0°F | Resp 18 | Ht 62.0 in | Wt 148.9 lb

## 2023-08-25 DIAGNOSIS — Z79811 Long term (current) use of aromatase inhibitors: Secondary | ICD-10-CM | POA: Insufficient documentation

## 2023-08-25 DIAGNOSIS — M8580 Other specified disorders of bone density and structure, unspecified site: Secondary | ICD-10-CM | POA: Diagnosis not present

## 2023-08-25 DIAGNOSIS — D0512 Intraductal carcinoma in situ of left breast: Secondary | ICD-10-CM | POA: Diagnosis not present

## 2023-08-25 MED ORDER — ANASTROZOLE 1 MG PO TABS: 1.0000 mg | ORAL_TABLET | Freq: Every day | ORAL | 3 refills | Status: DC

## 2023-08-25 NOTE — Progress Notes (Signed)
Patient Care Team: Debroah Loop, DO as PCP - General (Family Medicine) Abigail Miyamoto, MD as Consulting Physician (General Surgery) [name], MD as Consulting Physician (Hematology and Oncology) Antony Blackbird, MD as Consulting Physician (Radiation Oncology) Pershing Proud, RN as Oncology Nurse Navigator Donnelly Angelica, RN as Oncology Nurse Navigator  DIAGNOSIS:  Encounter Diagnosis  Name Primary?   Ductal carcinoma in situ (DCIS) of left breast Yes    SUMMARY OF ONCOLOGIC HISTORY: Oncology History  Ductal carcinoma in situ (DCIS) of left breast  06/19/2022 Initial Diagnosis   Screening detected distortion with calcifications in the left breast central 12 o'clock position measuring 1.9 cm.  No ultrasound correlate.  Axilla negative.  Stereotactic biopsy revealed intermediate grade DCIS cribriform and papillary features ER 100%, PR 60%   07/02/2022 Cancer Staging   Staging form: Breast, AJCC 8th Edition - Clinical: Stage 0 (cTis (DCIS), cN0, cM0, G2, ER+, PR+) - Signed by [name], MD on 07/02/2022 Stage prefix: Initial diagnosis Histologic grading system: 3 grade system    Genetic Testing   [facility] CustomNext Panel was Negative. Report date is 07/11/2022.  The CustomNext-Cancer+RNAinsight panel offered by [employer] [facility] includes sequencing and rearrangement analysis for the following 47 genes:  APC, ATM, AXIN2, BARD1, BMPR1A, BRCA1, BRCA2, BRIP1, CDH1, CDK4, CDKN2A, CHEK2, CTNNA1, DICER1, EPCAM, GREM1, HOXB13, KIT, MEN1, MLH1, MSH2, MSH3, MSH6, MUTYH, NBN, NF1, NTHL1, PALB2, PDGFRA, PMS2, POLD1, POLE, PTEN, RAD50, RAD51C, RAD51D, SDHA, SDHB, SDHC, SDHD, SMAD4, SMARCA4, STK11, TP53, TSC1, TSC2, and VHL.  RNA data is routinely analyzed for use in variant interpretation for all genes.   07/16/2022 Surgery   Left lumpectomy: DCIS cribriform and papillary types with focal necrosis grade 3 involving a complex sclerosing lesion, margins focally positive with superior/posterior  margin ER 100%, PR 60%   08/13/2022 Surgery   Reexcision of the margins: Benign   09/17/2022 - 10/14/2022 Radiation Therapy   Site Technique Total Dose (Gy) Dose per Fx (Gy) Completed Fx Beam Energies  Breast, Left: Breast_L 3D 42.72/42.72 2.67 16/16 6XFFF     10/2022 -  Anti-estrogen oral therapy   Tamoxifen     08/20/2023 Surgery   Right lumpectomy: Grade 2 DCIS 1.7 cm, margins negative, ER 95%, PR 90%    08/25/2023 -  Anti-estrogen oral therapy   Anastrozole 1 mg daily     CHIEF COMPLIANT: Follow-up after surgery  HISTORY OF PRESENT ILLNESS:  History of Present Illness   The patient, with a recent history of surgery for DCIS, presents for a follow-up visit. She reports experiencing more pain with this surgery compared to a previous one, but otherwise, the recovery seems to be going well. The patient is anxious about the possibility of needing radiation therapy and hopes to find out sooner rather than later. She has an appointment with a cardiologist on the 4th for further evaluation.  The patient also has a history of osteopenia, which is being monitored. She expresses concern about the potential side effects of anastrozole, an antiestrogen therapy, particularly its impact on bone density. The patient is due for a bone density test this year.         ALLERGIES:  is allergic to ibuprofen, lexapro [escitalopram oxalate], lisinopril, mobic [meloxicam], nabumetone, prilosec otc [omeprazole magnesium], zoloft [sertraline hcl], and sulfa antibiotics.  MEDICATIONS:  Current Outpatient Medications  Medication Sig Dispense Refill   anastrozole (ARIMIDEX) 1 MG tablet Take 1 tablet (1 mg total) by mouth daily. 90 tablet 3   acetaminophen (TYLENOL) 650 MG  CR tablet Take 1,300 mg by mouth every 8 (eight) hours as needed for pain.     amLODipine (NORVASC) 2.5 MG tablet Take 2.5 mg by mouth daily.     Bioflavonoid Products (BIOFLEX PO) Take 1 tablet by mouth daily.     Biotin 5000 MCG TABS  Take by mouth daily.     Calcium Magnesium Zinc 333-133-5 MG TABS Take by mouth daily.     COMIRNATY syringe Inject 0.3 mLs into the muscle once.     cyanocobalamin (VITAMIN B12) 1000 MCG tablet Take 1,000 mcg by mouth daily.     famotidine-calcium carbonate-magnesium hydroxide (PEPCID COMPLETE) 10-800-165 MG chewable tablet Chew 1 tablet by mouth as needed.     Flax Oil-Fish Oil-Borage Oil (FISH-FLAX-BORAGE PO) Take 1 capsule by mouth daily.     fluticasone (FLONASE) 50 MCG/ACT nasal spray Place 1 spray into both nostrils daily. (Patient taking differently: Place 1 spray into both nostrils daily as needed for allergies.) 16 g 0   irbesartan (AVAPRO) 300 MG tablet Take 300 mg by mouth daily.     isosorbide mononitrate (IMDUR) 30 MG 24 hr tablet Take 1/2 (one-half) tablet by mouth once daily 45 tablet 3   LORazepam (ATIVAN) 0.5 MG tablet Take 0.5 mg by mouth 2 (two) times daily as needed for anxiety.     meclizine (ANTIVERT) 25 MG tablet Take 25 mg by mouth 3 (three) times daily as needed for dizziness.     Misc Natural Products (OSTEO BI-FLEX TRIPLE STRENGTH) TABS Take by mouth daily.     Multiple Minerals-Vitamins (CAL MAG ZINC +D3 PO) Take 1 tablet by mouth daily.     multivitamin-lutein (OCUVITE-LUTEIN) CAPS Take 830 capsules by mouth daily.     omeprazole (PRILOSEC) 40 MG capsule Take 40 mg by mouth daily.     rOPINIRole (REQUIP) 1 MG tablet Take 1 tablet (1 mg total) by mouth 3 (three) times daily. (Patient taking differently: Take 1-2 mg by mouth See admin instructions. Take 1 mg by mouth in the morning & take 2 mg at night.) 270 tablet 3   simvastatin (ZOCOR) 20 MG tablet Take 20 mg by mouth every evening.     traMADol (ULTRAM) 50 MG tablet Take 1 tablet (50 mg total) by mouth every 6 (six) hours as needed for moderate pain (pain score 4-6) or severe pain (pain score 7-10). 20 tablet 0   No current facility-administered medications for this visit.    PHYSICAL EXAMINATION: ECOG  PERFORMANCE STATUS: 1 - Symptomatic but completely ambulatory  Vitals:   08/25/23 1118  BP: (!) 142/62  Pulse: 93  Resp: 18  Temp: 98 F (36.7 C)  SpO2: 96%   Filed Weights   08/25/23 1118  Weight: 148 lb 14.4 oz (67.5 kg)      LABORATORY DATA:  I have reviewed the data as listed    Latest Ref Rng & Units 08/05/2023    9:26 AM 06/02/2023    2:47 PM 01/14/2023   12:40 PM  CMP  Glucose 70 - 99 mg/dL 96  161  97   BUN 8 - 27 mg/dL 21  32  21   Creatinine 0.57 - 1.00 mg/dL 0.96  0.45  4.09   Sodium 134 - 144 mmol/L 141  133  135   Potassium 3.5 - 5.2 mmol/L 4.4  4.3  4.0   Chloride 96 - 106 mmol/L 104  102  105   CO2 20 - 29 mmol/L 27  22  25  Calcium 8.7 - 10.3 mg/dL 16.1  9.6  9.3   Total Protein 6.0 - 8.5 g/dL 6.4  6.4  7.0   Total Bilirubin 0.0 - 1.2 mg/dL 0.2  0.8  0.4   Alkaline Phos 44 - 121 IU/L 65  51  57   AST 0 - 40 IU/L 14  23  17    ALT 0 - 32 IU/L 11  19  14      Lab Results  Component Value Date   WBC 6.3 08/05/2023   HGB 12.3 08/05/2023   HCT 39.0 08/05/2023   MCV 96 08/05/2023   PLT 311 08/05/2023   NEUTROABS 6.0 06/02/2023    ASSESSMENT & PLAN:  Ductal carcinoma in situ (DCIS) of left breast 06/19/2022: Screening detected distortion with calcifications in the left breast central 12 o'clock position measuring 1.9 cm.  No ultrasound correlate.  Axilla negative.  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 Followed by adjuvant radiation therapy 09/18/2022-10/14/2022 3. Followed by antiestrogen therapy with tamoxifen 5 years started 10/15/2022 4.  Right lumpectomy 08/10/2023: Grade 2 DCIS 1.7 cm, margins negative, ER 95%, PR 90%    Tamoxifen toxicities: Tolerating it fairly well without any problems or concerns. She had an interaction with tamoxifen and trazodone but she quit trazodone and her symptoms have improved.   Breast cancer surveillance: Mammograms 06/10/2023: [facility]: 1.4 cm linear calcifications right lower outer quadrant: Stereotactic biopsy: Intermediate grade DCIS ER 95%, PR 90% Right lumpectomy: 08/10/2023: Grade 2 DCIS 1.7 cm, margins negative, ER 95%, PR 90% Bone density 2022: T-score -1.03: Mild osteopenia  Treatment plan: +/- Adjuvant radiation therapy: Patient sees Dr. Roselind Messier Adjuvant antiestrogen therapy with anastrozole 1 mg daily x 5 years I sent a new prescription for anastrozole today.   Return to clinic in 3 months for survivorship care plan visit  No orders of the defined types were placed in this encounter.  The patient has a good understanding of the overall plan. she agrees with it. she will call with any problems that may develop before the next visit here. Total time spent: 30 mins including face to face time and time spent for planning, charting and co-ordination of care   Tamsen Meek, MD 08/25/23

## 2023-08-26 ENCOUNTER — Telehealth: Payer: Self-pay | Admitting: *Deleted

## 2023-08-26 MED ORDER — METOPROLOL TARTRATE 25 MG PO TABS: 12.5000 mg | ORAL_TABLET | Freq: Two times a day (BID) | ORAL | 3 refills | Status: DC

## 2023-08-26 NOTE — Telephone Encounter (Signed)
The patient has been notified of the result and verbalized understanding.  All questions (if any) were answered. Medication list  updated , e-sent Metoprolol tartrate 12.5 mg  twice a day to pharmacy  Tobin Chad, RN 08/26/2023 10:18 AM

## 2023-08-26 NOTE — Telephone Encounter (Signed)
-----   Message from Ronney Asters sent at 08/23/2023 10:32 AM EST ----- Please contact Alicia [name] and let her know that her cardiac event monitor has been reviewed.  She was noted to have sinus rhythm, sinus tachycardia, occasional PACs and PVCs.  She was noted to have runs of SVT as well.  Her fastest run was noted to be a maximum heart rate of 210 bpm.  Her longest episode of SVT lasted about 25 seconds.  I will prescribe metoprolol to tartrate 12.5 mg twice daily.  We will discontinue her amlodipine.  Thank you.

## 2023-09-08 NOTE — Progress Notes (Signed)
Radiation Oncology         [phone] ________________________________  Name: Alicia [name] MRN: [id number]  Date: 09/09/2023  DOB: 1941-09-21  Re-Evaluation Note  CC: Laurice Record, MD  No diagnosis found.  Diagnosis: Stage 0 (cTis (DCIS), cN0, cM0) Right Breast LOQ, Intermediate grade DCIS, ER+ / PR+ / Her2 not assessed: s/p lumpectomy without nodal biopsies.    History of Stage 0 (cTis (DCIS), cN0, cM0) Left Breast, Intermediate to high-grade DCIS, ER+ / PR+ / Her2 not assessed, s/p lumpectomy, adjuvant radiation therapy, and antiestrogen therapy (tamoxifen).    Narrative - 09/09/23:  The patient returns today to discuss radiation treatment options. She was seen in re-consultation on 07/08/23.   Since her re-consultation date, she opted to proceed with a right breast lumpectomy without nodal biopsies on 08/11/23 under the care of Dr. Magnus Ivan. Pathology from the procedure revealed: histology of intermediate grade DCIS measuring 1.7 cm in the greatest extent with single cell necrosis; all margins negative for in situ carcinoma; no lymph nodes were examined. ER status 95% positive and PR status 90% positive, both with strong staining intensity; Her2 not assessed.      He has met with Dr. Pamelia Hoit and has agreed to proceed with antiestrogen therapy consisting of anastrozole x 5 years. (She was previously on tamoxifen which she began in January 2024 after completing radiation therapy to the DCIS in her left breast).   On review of systems, the patient reports ***. She denies *** and any other symptoms.    HPI - Re-Consultation 07/08/23 ::Alicia [name] is a 82 y.o. female who returns today as a courtesy of Dr. Pamelia Hoit for an opinion concerning radiation therapy as part of management for her recently diagnosed right breast DCIS.    She was last seen here on 11/20/22 for follow-up of radiation to the left breast, and has since continued to follow-up with Dr. Pamelia Hoit. She  has continued on tamoxifen which she continues to tolerate well. She previously encountered an interaction between tamoxifen and trazodone which has since resolved after discontinuing trazodone.    In the setting of her history of left breast DCIS, she presented for a bilateral diagnostic mammogram on 06/10/23 [facility]) which demonstrated a new 1.4 cm group of linear calcifications in the lower outer right breast, located 4.5 cmfn. No other abnormalities were appreciated in either breast.    Biopsy of the lower outer right breast calcifications on 06/22/23 showed intermediate grade DCIS measuring 4 mm in the greatest linear extent of the sample. Prognostic indicators significant for: estrogen receptor 95% positive and progesterone receptor 90% positive, both with strong staining intensity; Her2 not assessed.    She was seen in consultation by Dr. Magnus Ivan on 06/26/23 and has agreed to proceed with breast conserving surgery. Her procedure is currently scheduled for 08/11/23.    Of note: for evaluation of chest pain and SOB, she presented for a CTA of the chest with contrast on 01/15/23 which showed no evidence of PE and no acute pulmonary abnormalities overall.      PREVIOUS RADIATION THERAPY: Yes   Intent: Curative  Radiation Treatment Dates: 09/17/2022 through 10/14/2022 Site Technique Total Dose (Gy) Dose per Fx (Gy) Completed Fx Beam Energies  Breast, Left: Breast_L 3D 42.72/42.72 2.67 16/16 6XFFF     Allergies:  is allergic to ibuprofen, lexapro [escitalopram oxalate], lisinopril, mobic [meloxicam], nabumetone, prilosec otc [omeprazole magnesium], zoloft [sertraline hcl], and sulfa antibiotics.  Meds: Current Outpatient Medications  Medication  Sig Dispense Refill   acetaminophen (TYLENOL) 650 MG CR tablet Take 1,300 mg by mouth every 8 (eight) hours as needed for pain.     anastrozole (ARIMIDEX) 1 MG tablet Take 1 tablet (1 mg total) by mouth daily. 90 tablet 3   Bioflavonoid Products  (BIOFLEX PO) Take 1 tablet by mouth daily.     Biotin 5000 MCG TABS Take by mouth daily.     Calcium Magnesium Zinc 333-133-5 MG TABS Take by mouth daily.     COMIRNATY syringe Inject 0.3 mLs into the muscle once.     cyanocobalamin (VITAMIN B12) 1000 MCG tablet Take 1,000 mcg by mouth daily.     famotidine-calcium carbonate-magnesium hydroxide (PEPCID COMPLETE) 10-800-165 MG chewable tablet Chew 1 tablet by mouth as needed.     Flax Oil-Fish Oil-Borage Oil (FISH-FLAX-BORAGE PO) Take 1 capsule by mouth daily.     fluticasone (FLONASE) 50 MCG/ACT nasal spray Place 1 spray into both nostrils daily. (Patient taking differently: Place 1 spray into both nostrils daily as needed for allergies.) 16 g 0   irbesartan (AVAPRO) 300 MG tablet Take 300 mg by mouth daily.     isosorbide mononitrate (IMDUR) 30 MG 24 hr tablet Take 1/2 (one-half) tablet by mouth once daily 45 tablet 3   LORazepam (ATIVAN) 0.5 MG tablet Take 0.5 mg by mouth 2 (two) times daily as needed for anxiety.     meclizine (ANTIVERT) 25 MG tablet Take 25 mg by mouth 3 (three) times daily as needed for dizziness.     metoprolol tartrate (LOPRESSOR) 25 MG tablet Take 0.5 tablets (12.5 mg total) by mouth 2 (two) times daily. 90 tablet 3   Misc Natural Products (OSTEO BI-FLEX TRIPLE STRENGTH) TABS Take by mouth daily.     Multiple Minerals-Vitamins (CAL MAG ZINC +D3 PO) Take 1 tablet by mouth daily.     multivitamin-lutein (OCUVITE-LUTEIN) CAPS Take 830 capsules by mouth daily.     omeprazole (PRILOSEC) 40 MG capsule Take 40 mg by mouth daily.     rOPINIRole (REQUIP) 1 MG tablet Take 1 tablet (1 mg total) by mouth 3 (three) times daily. (Patient taking differently: Take 1-2 mg by mouth See admin instructions. Take 1 mg by mouth in the morning & take 2 mg at night.) 270 tablet 3   simvastatin (ZOCOR) 20 MG tablet Take 20 mg by mouth every evening.     traMADol (ULTRAM) 50 MG tablet Take 1 tablet (50 mg total) by mouth every 6 (six) hours as  needed for moderate pain (pain score 4-6) or severe pain (pain score 7-10). 20 tablet 0   No current facility-administered medications for this encounter.    Physical Findings: The patient is in no acute distress. Patient is alert and oriented.  vitals were not taken for this visit.  No significant changes. Lungs are clear to auscultation bilaterally. Heart has regular rate and rhythm. No palpable cervical, supraclavicular, or axillary adenopathy. Abdomen soft, non-tender, normal bowel sounds. Left Breast: no palpable mass, nipple discharge or bleeding. Right Breast: *** no palpable mass, nipple discharge or bleeding  Lab Findings: Lab Results  Component Value Date   WBC 6.3 08/05/2023   HGB 12.3 08/05/2023   HCT 39.0 08/05/2023   MCV 96 08/05/2023   PLT 311 08/05/2023    Radiographic Findings: LONG TERM MONITOR (3-14 DAYS)  Result Date: 08/22/2023 Patch Wear Time:  2 days and 22 hours (2024-11-06T09:30:58-0500 to 2024-11-09T08:30:13-0500) Patient had a min HR of 62 bpm, max HR of  210 bpm, and avg HR of 93 bpm. Predominant underlying rhythm was Sinus Rhythm. Bundle Branch Block/IVCD was present. 17 Supraventricular Tachycardia runs occurred, the run with the fastest interval lasting 4 beats with a max rate of 210 bpm, the longest lasting 25.1 secs with an avg rate of 134 bpm. Isolated SVEs were rare (<1.0%), SVE Couplets were rare (<1.0%), and SVE Triplets were rare (<1.0%). Isolated VEs were rare (<1.0%), and no VE Couplets or VE Triplets were present. SR/ST Occasional PACs/PVCs Runs of SVT (17), longest lasted 25 sec    Impression: Stage 0 (cTis (DCIS), cN0, cM0) Right Breast LOQ, Intermediate grade DCIS, ER+ / PR+ / Her2 not assessed: s/p lumpectomy without nodal biopsies.   ***  Plan:  Patient is scheduled for CT simulation {date/later today}. ***  -----------------------------------  Billie Lade, PhD, MD  This document serves as a record of services personally performed  by Antony Blackbird, MD. It was created on his behalf by Neena Rhymes, a trained medical scribe. The creation of this record is based on the scribe's personal observations and the provider's statements to them. This document has been checked and approved by the attending provider.

## 2023-09-08 NOTE — Progress Notes (Incomplete)
Location of Breast Cancer:right    Histology per Pathology Report:     Receptor Status: ER(postive), PR (postive),   Did patient present with symptoms (if so, please note symptoms) or was this found on screening mammography?: Mammography  Past/Anticipated interventions by surgeon, if NWG:NFAOZ     Past/Anticipated interventions by medical  oncology, if any:    Lymphedema issues, if any:  yes right   Pain issues, if any:  Yes, she reports intermittent pain in the breast.  SAFETY ISSUES: Prior radiation? Yes Pacemaker/ICD? No Possible current pregnancy?No Is the patient on methotrexate? No      BP 122/65 (BP Location: Left Arm, Patient Position: Sitting)   Pulse 65   Temp 97.8 F (36.6 C) (Temporal)   Resp 18   Ht 5\' 2"  (1.575 m)   Wt 154 lb 6 oz (70 kg)   SpO2 99%   BMI 28.24 kg/m

## 2023-09-09 ENCOUNTER — Ambulatory Visit
Admission: RE | Admit: 2023-09-09 | Discharge: 2023-09-09 | Disposition: A | Discharge status: Home / Self Care | Payer: Medicare HMO | Source: Ambulatory Visit | Attending: Radiation Oncology | Admitting: Radiation Oncology

## 2023-09-09 ENCOUNTER — Ambulatory Visit: Payer: Medicare HMO | Admitting: Radiation Oncology

## 2023-09-09 ENCOUNTER — Ambulatory Visit: Payer: Medicare HMO

## 2023-09-09 ENCOUNTER — Ambulatory Visit
Admission: RE | Admit: 2023-09-09 | Discharge: 2023-09-09 | Discharge status: Home / Self Care | Payer: Medicare HMO | Source: Ambulatory Visit | Attending: Radiation Oncology | Admitting: Radiation Oncology

## 2023-09-09 ENCOUNTER — Encounter: Payer: Self-pay | Admitting: Radiation Oncology

## 2023-09-09 VITALS — BP 122/65 | HR 65 | Temp 97.8°F | Resp 18 | Ht 62.0 in | Wt 154.4 lb

## 2023-09-09 DIAGNOSIS — Z170 Estrogen receptor positive status [ER+]: Secondary | ICD-10-CM | POA: Insufficient documentation

## 2023-09-09 DIAGNOSIS — Z79899 Other long term (current) drug therapy: Secondary | ICD-10-CM | POA: Diagnosis not present

## 2023-09-09 DIAGNOSIS — Z79810 Long term (current) use of selective estrogen receptor modulators (SERMs): Secondary | ICD-10-CM | POA: Insufficient documentation

## 2023-09-09 DIAGNOSIS — D0511 Intraductal carcinoma in situ of right breast: Secondary | ICD-10-CM | POA: Insufficient documentation

## 2023-09-09 DIAGNOSIS — Z510 Encounter for antineoplastic radiation therapy: Secondary | ICD-10-CM | POA: Insufficient documentation

## 2023-09-09 DIAGNOSIS — Z923 Personal history of irradiation: Secondary | ICD-10-CM | POA: Diagnosis not present

## 2023-09-09 DIAGNOSIS — C50511 Malignant neoplasm of lower-outer quadrant of right female breast: Secondary | ICD-10-CM | POA: Diagnosis not present

## 2023-09-09 DIAGNOSIS — D0512 Intraductal carcinoma in situ of left breast: Secondary | ICD-10-CM | POA: Diagnosis not present

## 2023-09-10 ENCOUNTER — Ambulatory Visit: Payer: Medicare HMO | Admitting: Radiation Oncology

## 2023-09-14 NOTE — Progress Notes (Unsigned)
Cardiology Clinic Note   Patient Name: JODENE MANDEZ Date of Encounter: 09/14/2023  Primary Care Provider:  Debroah Loop, DO Primary Cardiologist:  None  Patient Profile    MIREYAH ALBERTINI 82 year old female presents the clinic today for follow-up evaluation of her chest discomfort and preoperative cardiac evaluation.  Past Medical History    Past Medical History:  Diagnosis Date   Anginal pain (HCC)    Anxiety 2014   Bursitis    Cancer (HCC) 06/2022   left breast DCIS   Dyspnea    with exertion   GERD (gastroesophageal reflux disease) 2008   History of radiation therapy    Left breast- 09/17/22-10/14/22- Dr. Antony Blackbird   Hypercholesteremia 2003   Hypertension 2003   Insomnia 1988   Kidney stone    Osteoarthritis 2008   hands, knees, right hip   Osteopenia 2002   Restless leg syndrome 2000   RLS (restless legs syndrome) 03/28/2016   Vertigo 2002   Past Surgical History:  Procedure Laterality Date   ABDOMINAL HYSTERECTOMY     APPENDECTOMY     BLEPHAROPLASTY  2019   BREAST LUMPECTOMY WITH RADIOACTIVE SEED LOCALIZATION Left 07/16/2022   Procedure: LEFT BREAST LUMPECTOMY WITH RADIOACTIVE SEED LOCALIZATION;  Surgeon: Abigail Miyamoto, MD;  Location: [facility];  Service: General;  Laterality: Left;   BREAST LUMPECTOMY WITH RADIOACTIVE SEED LOCALIZATION Right 08/11/2023   Procedure: RADIOACTIVE SEED GUIDED RIGHT BREAST LUMPECTOMY;  Surgeon: Abigail Miyamoto, MD;  Location: [facility];  Service: General;  Laterality: Right;  LMA   CATARACT EXTRACTION Right 02/2010   CHOLECYSTECTOMY     COLONOSCOPY  2015   results normal   LEFT HEART CATH AND CORONARY ANGIOGRAPHY N/A 09/20/2020   Procedure: LEFT HEART CATH AND CORONARY ANGIOGRAPHY;  Surgeon: Runell Gess, MD;  Location: [facility];  Service: Cardiovascular;  Laterality: N/A;   RE-EXCISION OF BREAST LUMPECTOMY Left 08/13/2022   Procedure: RE-EXCISION OF LEFT BREAST CANCER;  Surgeon: Abigail Miyamoto, MD;  Location: [facility];  Service: General;  Laterality: Left;   ROTATOR CUFF REPAIR Right     Allergies  Allergies  Allergen Reactions   Ibuprofen Other (See Comments)    GI UPSET   Lexapro [Escitalopram Oxalate] Other (See Comments)    GERD   Lisinopril Cough   Mobic [Meloxicam] Other (See Comments)    LEG PAIN   Nabumetone Other (See Comments)    GI UPSET   Prilosec Otc [Omeprazole Magnesium] Other (See Comments)    ARM SPASM   Zoloft [Sertraline Hcl] Other (See Comments)    INSOMNIA   Sulfa Antibiotics Rash    History of Present Illness    SHAQUAVIA GUILLEN has a PMH of HTN, diverticulosis, osteopenia, RLS, HLD, anxiety, chest pain of uncertain etiology, and breast CA.  She has a 15-20 pack year of tobacco abuse.  She stopped smoking around 25 years ago.  Her parents both died of MI.  She was initially referred to cardiology for evaluation of her chest discomfort.  She noted exertional chest discomfort.  She underwent left heart cath on 09/20/2020 by Dr. Allyson Sabal.  Her catheterization was normal.  It was felt that her symptoms were related to reflux.  She was seen in follow-up by Dr. Allyson Sabal on 12/03/2021.  During that time she continued to do well.  Her chest pain had improved with treatment of her reflux.  Her blood pressure was well-controlled..  Her cholesterol was also well-managed.  She presents to the clinic today for follow-up evaluation and preoperative cardiac evaluation.  She states she has been noticing intermittent episodes of fast heart rate.  She reports episodes that are brief.  She does note that her heart rate will go up to 120-130 then returned to normal.  She reports that when she has been admitted to the hospital she has always been told that she is dehydrated.  She reports adequate hydration at this time.  Case was reviewed with DOD.  I will order a 3-day cardiac event monitor, CBC, CMP, TSH, magnesium and have her avoid triggers for  palpitations.  We will have her proceed to surgery and follow-up in 6 weeks.  Today she denies chest pain, shortness of breath, lower extremity edema, fatigue,  melena, hematuria, hemoptysis, diaphoresis, weakness, presyncope, syncope, orthopnea, and PND.      Home Medications    Prior to Admission medications   Medication Sig Start Date End Date Taking? Authorizing Provider  acetaminophen (TYLENOL) 650 MG CR tablet Take 1,300 mg by mouth every 8 (eight) hours as needed for pain.    [provider]  amLODipine (NORVASC) 2.5 MG tablet Take 2.5 mg by mouth daily. 09/08/22   [provider]  Bioflavonoid Products (BIOFLEX PO) Take 1 tablet by mouth daily.    [provider]  Flax Oil-Fish Oil-Borage Oil (FISH-FLAX-BORAGE PO) Take 1 capsule by mouth daily.    [provider]  fluticasone (FLONASE) 50 MCG/ACT nasal spray Place 1 spray into both nostrils daily. Patient taking differently: Place 1 spray into both nostrils daily as needed for allergies. 07/11/21   Merrilee Jansky, MD  irbesartan (AVAPRO) 300 MG tablet Take 300 mg by mouth daily. 10/11/19   [provider]  isosorbide mononitrate (IMDUR) 30 MG 24 hr tablet Take 1/2 (one-half) tablet by mouth once daily 04/07/23   Runell Gess, MD  LORazepam (ATIVAN) 0.5 MG tablet Take 0.5 mg by mouth 2 (two) times daily as needed for anxiety. 02/11/16   [provider]  meclizine (ANTIVERT) 25 MG tablet Take 25 mg by mouth 3 (three) times daily as needed for dizziness. 04/27/20   [provider]  Multiple Minerals-Vitamins (CAL MAG ZINC +D3 PO) Take 1 tablet by mouth daily.    [provider]  multivitamin-lutein (OCUVITE-LUTEIN) CAPS Take 830 capsules by mouth daily.    [provider]  omeprazole (PRILOSEC) 40 MG capsule Take 40 mg by mouth daily. 02/06/16   [provider]  rOPINIRole (REQUIP) 1 MG tablet Take 1 tablet (1 mg total) by mouth 3 (three) times  daily. Patient taking differently: Take 1-2 mg by mouth See admin instructions. Take 1 mg by mouth in the morning & take 2 mg at night. 10/20/18   Patel, Roxana Hires K, DO  simvastatin (ZOCOR) 20 MG tablet Take 20 mg by mouth every evening. 02/06/16   [provider]    Family History    Family History  Problem Relation Age of Onset   Heart attack Mother    Depression Mother    Stroke Mother    Diabetes Mother    Heart attack Father    COPD Sister    COPD Sister    COPD Brother    COPD Brother    Cancer Maternal Aunt    Breast cancer Maternal Aunt 60   Restless legs syndrome Daughter    Restless legs syndrome Son    Restless legs syndrome Son    She indicated  that her mother is deceased. She indicated that her father is deceased. She indicated that all of her three sisters are deceased. She indicated that all of her three brothers are deceased. She indicated that her daughter is alive. She indicated that both of her sons are alive. She indicated that the status of her maternal aunt is unknown.  Social History    Social History   Socioeconomic History   Marital status: Widowed    Spouse name: Not on file   Number of children: Not on file   Years of education: Not on file   Highest education level: Not on file  Occupational History   Not on file  Tobacco Use   Smoking status: Former    Current packs/day: 0.00    Average packs/day: 0.5 packs/day for 20.0 years (10.0 ttl pk-yrs)    Types: Cigarettes    Start date: 10/06/1976    Quit date: 10/06/1996    Years since quitting: 26.9    Passive exposure: Past   Smokeless tobacco: Never  Vaping Use   Vaping status: Never Used  Substance and Sexual Activity   Alcohol use: No    Alcohol/week: 0.0 standard drinks of alcohol   Drug use: No   Sexual activity: Not Currently    Birth control/protection: Surgical    Comment: hyst  Other Topics Concern   Not on file  Social History Narrative   Lives alone in a one story home.   Has 3 children.     Semi retired.     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   Social Determinants of Health   Financial Resource Strain: Not on file  Food Insecurity: No Food Insecurity (09/09/2023)   Hunger Vital Sign    Worried About Running Out of Food in the Last Year: Never true    Ran Out of Food in the Last Year: Never true  Transportation Needs: No Transportation Needs (09/09/2023)   PRAPARE - Administrator, Civil Service (Medical): No    Lack of Transportation (Non-Medical): No  Physical Activity: Not on file  Stress: Not on file  Social Connections: Not on file  Intimate Partner Violence: Not At Risk (09/09/2023)   Humiliation, Afraid, Rape, and Kick questionnaire    Fear of Current or Ex-Partner: No    Emotionally Abused: No    Physically Abused: No    Sexually Abused: No     Review of Systems    General:  No chills, fever, night sweats or weight changes.  Cardiovascular:  No chest pain, dyspnea on exertion, edema, orthopnea, palpitations, paroxysmal nocturnal dyspnea. Dermatological: No rash, lesions/masses Respiratory: No cough, dyspnea Urologic: No hematuria, dysuria Abdominal:   No nausea, vomiting, diarrhea, bright red blood per rectum, melena, or hematemesis Neurologic:  No visual changes, wkns, changes in mental status. All other systems reviewed and are otherwise negative except as noted above.  Physical Exam    VS:  There were no vitals taken for this visit. , BMI There is no height or weight on file to calculate BMI. GEN: Well nourished, well developed, in no acute distress. HEENT: normal. Neck: Supple, no JVD, carotid bruits, or masses. Cardiac: RRR, no murmurs, rubs, or gallops. No clubbing, cyanosis, edema.  Radials/DP/PT 2+ and equal bilaterally.  Respiratory:  Respirations regular and unlabored, clear to auscultation bilaterally. GI: Soft, nontender, nondistended, BS + x 4. MS: no deformity or atrophy. Skin: warm and  dry, no rash. Neuro:  Strength  and sensation are intact. Psych: Normal affect.  Accessory Clinical Findings    Recent Labs: 08/05/2023: ALT 11; BUN 21; Creatinine, Ser 1.03; Hemoglobin 12.3; Magnesium 2.3; Platelets 311; Potassium 4.4; Sodium 141; TSH 1.130   Recent Lipid Panel No results found for: "CHOL", "TRIG", "HDL", "CHOLHDL", "VLDL", "LDLCALC", "LDLDIRECT"  No BP recorded.  {Refresh Note OR Click here to enter BP  :1}***    ECG personally reviewed by me today-      Cardiac catheterization 09/20/2020  The left ventricular systolic function is normal. LV end diastolic pressure is normal. The left ventricular ejection fraction is 55-65% by visual estimate.   JIMMYA BOWERMASTER is a 81 y.o. female      [id number] LOCATION:  FACILITY: [facility]  PHYSICIAN: Nanetta Batty, M.D. 1941/01/25     DATE OF PROCEDURE:  09/20/2020   DATE OF DISCHARGE:        CARDIAC CATHETERIZATION        History obtained from chart review.MEL MALCOM is a 82 y.o. 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  She was referred by Dr. Clovis Riley, her primary provider, for evaluation of exertional chest pain.  Her risk factor profile is notable for 15 to 20 pack years of tobacco abuse having quit 25 years ago, treated hypertension and hyperlipidemia.  Both of her parents died of myocardial infarction's.  She is never had a heart attack or stroke.  She does have RLS and GERD.  She is complained of new onset exertional chest pain for last 3 to 4 months that occurs with walking associated shortness of breath and upper extremity radiation.  She presents today for outpatient diagnostic coronary angiography to define her anatomy and physiology rule out an ischemic etiology.     IMPRESSION: Ms Parker has normal coronary arteries and normal LV function.  I believe her chest pain and other  symptoms are noncardiac.  The sheath was removed and a TR band was placed on the right wrist to achieve patent hemostasis.  The patient left the lab in stable condition.  She will be discharged home today as an outpatient and will see back in the office in several weeks for follow-up.   Nanetta Batty. MD, [facility] 09/20/2020 1:27 PM      Assessment & Plan   1.  Palpitations-EKG today shows normal sinus rhythm with right bundle branch block 74 bpm.  She reports 4-5 episodes of brief palpitations through the day with heart rates in the 120-130 range.  Case was reviewed with DOD. Order CBC, CMP, TSH, magnesium 3-day cardiac event monitor  Chest discomfort-denies recent episodes of arm neck back or chest discomfort.  Previously noted to have reflux type discomfort.  Underwent reassuring cardiac catheterization 12/21. Heart healthy low-sodium diet Continue current medical therapy Maintain physical activity  Essential hypertension-BP today 116/62. Maintain blood pressure log Heart healthy low-sodium diet  Hyperlipidemia-LDL 70 on 05/01/2023. High-fiber diet Continue with his simvastatin Follows with PCP  Preop cardiac evaluation-radioactive seed guided right breast lumpectomy, Dr. Abigail Miyamoto, 08/11/2023    Primary Cardiologist: Dr. Allyson Sabal  Chart reviewed as part of pre-operative protocol coverage. Given past medical history and time since last visit, based on ACC/AHA guidelines, LAQUASIA LYDICK would be at acceptable risk for the planned procedure without further cardiovascular testing.   Her RCRI is low risk, 0.4% risk of major cardiac event.  She is able  to complete greater than 4 METS of physical activity.  Patient was advised that if she develops new symptoms prior to surgery to contact our office to arrange a follow-up appointment.  He verbalized understanding.    Disposition: Follow-up with Dr. Allyson Sabal or me in 6 weeks.   Thomasene Ripple. Daniil Labarge NP-C     09/14/2023, 7:36 AM [facility]  [facility] [address] Office [phone] Fax [phone]    I spent 15*** minutes examining this patient, reviewing medications, and using patient centered shared decision making involving her cardiac care.   I spent greater than 20 minutes reviewing her past medical history,  medications, and prior cardiac tests.

## 2023-09-15 ENCOUNTER — Encounter: Payer: Self-pay | Admitting: *Deleted

## 2023-09-15 DIAGNOSIS — D0512 Intraductal carcinoma in situ of left breast: Secondary | ICD-10-CM

## 2023-09-16 ENCOUNTER — Ambulatory Visit: Payer: Medicare HMO | Attending: General Practice | Admitting: General Practice

## 2023-09-16 ENCOUNTER — Encounter: Payer: Self-pay | Admitting: General Practice

## 2023-09-16 VITALS — BP 96/58 | HR 71 | Ht 62.0 in | Wt 152.4 lb

## 2023-09-16 DIAGNOSIS — R002 Palpitations: Secondary | ICD-10-CM | POA: Diagnosis not present

## 2023-09-16 DIAGNOSIS — E785 Hyperlipidemia, unspecified: Secondary | ICD-10-CM

## 2023-09-16 DIAGNOSIS — R079 Chest pain, unspecified: Secondary | ICD-10-CM

## 2023-09-16 DIAGNOSIS — I10 Essential (primary) hypertension: Secondary | ICD-10-CM

## 2023-09-16 MED ORDER — METOPROLOL SUCCINATE ER 25 MG PO TB24: 12.5000 mg | ORAL_TABLET | Freq: Every day | ORAL | 3 refills | Status: DC

## 2023-09-16 NOTE — Patient Instructions (Addendum)
Medication Instructions:  Stop Metoprolol tartrate 25 MG Start Metoprolol Succinate 12.5 mg daily *If you need a refill on your cardiac medications before your next appointment, please call your pharmacy*   Lab Work: none If you have labs (blood work) drawn today and your tests are completely normal, you will receive your results only by: MyChart Message (if you have MyChart) OR A paper copy in the mail If you have any lab test that is abnormal or we need to change your treatment, we will call you to review the results.   Testing/Procedures: none   Follow-Up: At [facility], you and your health needs are our priority.  As part of our continuing mission to provide you with exceptional heart care, we have created designated Provider Care Teams.  These Care Teams include your primary Cardiologist (physician) and Advanced Practice Providers (APPs -  Physician Assistants and Nurse Practitioners) who all work together to provide you with the care you need, when you need it.  We recommend signing up for the patient portal called "MyChart".  Sign up information is provided on this After Visit Summary.  MyChart is used to connect with patients for Virtual Visits (Telemedicine).  Patients are able to view lab/test results, encounter notes, upcoming appointments, etc.  Non-urgent messages can be sent to your provider as well.   To learn more about what you can do with MyChart, go to [email or web address].    Your next appointment:   6 month(s)  Provider:   Edd Fabian, NP   Other Instructions Please continue your sports  drinks as discuss with your provider

## 2023-09-17 ENCOUNTER — Other Ambulatory Visit: Payer: Self-pay | Admitting: Hematology and Oncology

## 2023-09-17 DIAGNOSIS — Z510 Encounter for antineoplastic radiation therapy: Secondary | ICD-10-CM | POA: Diagnosis not present

## 2023-09-23 ENCOUNTER — Ambulatory Visit
Admission: RE | Admit: 2023-09-23 | Discharge: 2023-09-23 | Disposition: A | Discharge status: Home / Self Care | Payer: Medicare HMO | Source: Ambulatory Visit | Attending: Radiation Oncology | Admitting: Radiation Oncology

## 2023-09-23 ENCOUNTER — Other Ambulatory Visit: Payer: Self-pay

## 2023-09-23 ENCOUNTER — Telehealth: Payer: Self-pay | Admitting: *Deleted

## 2023-09-23 DIAGNOSIS — Z510 Encounter for antineoplastic radiation therapy: Secondary | ICD-10-CM | POA: Diagnosis not present

## 2023-09-23 DIAGNOSIS — D0511 Intraductal carcinoma in situ of right breast: Secondary | ICD-10-CM

## 2023-09-23 LAB — RAD ONC ARIA SESSION SUMMARY
Course Elapsed Days: 0
Plan Fractions Treated to Date: 1
Plan Prescribed Dose Per Fraction: 2.67 Gy
Plan Total Fractions Prescribed: 15
Plan Total Prescribed Dose: 40.05 Gy
Reference Point Dosage Given to Date: 2.67 Gy
Reference Point Session Dosage Given: 2.67 Gy
Session Number: 1

## 2023-09-23 NOTE — Telephone Encounter (Signed)
Received call from pt with complaint of generalized body aches since stating Anastrozole.  Pt states she is currently taking 3,000 mg of tylenol a day to help with joint pain. Per MD pt needing to stop anastrozole x2 weeks to see if symptoms diminish. If symptoms subside, pt will need to be prescribed Letrozole 2.5 mg p.o daily.  Pt educated to contact our office in 2 weeks, pt verbalized understanding.

## 2023-09-24 ENCOUNTER — Other Ambulatory Visit: Payer: Self-pay

## 2023-09-24 ENCOUNTER — Ambulatory Visit
Admission: RE | Admit: 2023-09-24 | Discharge: 2023-09-24 | Disposition: A | Discharge status: Home / Self Care | Payer: Medicare HMO | Source: Ambulatory Visit | Attending: Radiation Oncology | Admitting: Radiation Oncology

## 2023-09-24 DIAGNOSIS — C50511 Malignant neoplasm of lower-outer quadrant of right female breast: Secondary | ICD-10-CM | POA: Diagnosis not present

## 2023-09-24 DIAGNOSIS — Z510 Encounter for antineoplastic radiation therapy: Secondary | ICD-10-CM | POA: Diagnosis not present

## 2023-09-24 DIAGNOSIS — Z170 Estrogen receptor positive status [ER+]: Secondary | ICD-10-CM | POA: Diagnosis not present

## 2023-09-24 LAB — RAD ONC ARIA SESSION SUMMARY
Course Elapsed Days: 1
Plan Fractions Treated to Date: 2
Plan Prescribed Dose Per Fraction: 2.67 Gy
Plan Total Fractions Prescribed: 15
Plan Total Prescribed Dose: 40.05 Gy
Reference Point Dosage Given to Date: 5.34 Gy
Reference Point Session Dosage Given: 2.67 Gy
Session Number: 2

## 2023-09-25 ENCOUNTER — Other Ambulatory Visit: Payer: Self-pay

## 2023-09-25 ENCOUNTER — Ambulatory Visit
Admission: RE | Admit: 2023-09-25 | Discharge: 2023-09-25 | Disposition: A | Discharge status: Home / Self Care | Payer: Medicare HMO | Source: Ambulatory Visit | Attending: Radiation Oncology | Admitting: Radiation Oncology

## 2023-09-25 DIAGNOSIS — Z510 Encounter for antineoplastic radiation therapy: Secondary | ICD-10-CM | POA: Diagnosis not present

## 2023-09-25 DIAGNOSIS — Z170 Estrogen receptor positive status [ER+]: Secondary | ICD-10-CM | POA: Diagnosis not present

## 2023-09-25 DIAGNOSIS — D0511 Intraductal carcinoma in situ of right breast: Secondary | ICD-10-CM

## 2023-09-25 DIAGNOSIS — C50511 Malignant neoplasm of lower-outer quadrant of right female breast: Secondary | ICD-10-CM | POA: Diagnosis not present

## 2023-09-25 LAB — RAD ONC ARIA SESSION SUMMARY
Course Elapsed Days: 2
Plan Fractions Treated to Date: 3
Plan Prescribed Dose Per Fraction: 2.67 Gy
Plan Total Fractions Prescribed: 15
Plan Total Prescribed Dose: 40.05 Gy
Reference Point Dosage Given to Date: 8.01 Gy
Reference Point Session Dosage Given: 2.67 Gy
Session Number: 3

## 2023-09-25 MED ORDER — RADIAPLEXRX EX GEL: Freq: Once | CUTANEOUS | Status: AC

## 2023-09-25 MED ORDER — ALRA NON-METALLIC DEODORANT (RAD-ONC)
1.0000 | Freq: Once | TOPICAL | Status: AC
Start: 2023-09-25 — End: 2023-09-25
  Administered 2023-09-25: 1 via TOPICAL

## 2023-09-28 ENCOUNTER — Ambulatory Visit
Admission: RE | Admit: 2023-09-28 | Discharge: 2023-09-28 | Disposition: A | Discharge status: Home / Self Care | Payer: Medicare HMO | Source: Ambulatory Visit | Attending: Radiation Oncology | Admitting: Radiation Oncology

## 2023-09-28 ENCOUNTER — Other Ambulatory Visit: Payer: Self-pay

## 2023-09-28 DIAGNOSIS — Z510 Encounter for antineoplastic radiation therapy: Secondary | ICD-10-CM | POA: Diagnosis not present

## 2023-09-28 DIAGNOSIS — Z170 Estrogen receptor positive status [ER+]: Secondary | ICD-10-CM | POA: Diagnosis not present

## 2023-09-28 DIAGNOSIS — C50511 Malignant neoplasm of lower-outer quadrant of right female breast: Secondary | ICD-10-CM | POA: Diagnosis not present

## 2023-09-28 LAB — RAD ONC ARIA SESSION SUMMARY
Course Elapsed Days: 5
Plan Fractions Treated to Date: 4
Plan Prescribed Dose Per Fraction: 2.67 Gy
Plan Total Fractions Prescribed: 15
Plan Total Prescribed Dose: 40.05 Gy
Reference Point Dosage Given to Date: 10.68 Gy
Reference Point Session Dosage Given: 2.67 Gy
Session Number: 4

## 2023-09-29 ENCOUNTER — Other Ambulatory Visit: Payer: Self-pay

## 2023-09-29 ENCOUNTER — Ambulatory Visit
Admission: RE | Admit: 2023-09-29 | Discharge: 2023-09-29 | Disposition: A | Discharge status: Home / Self Care | Payer: Medicare HMO | Source: Ambulatory Visit | Attending: Radiation Oncology | Admitting: Radiation Oncology

## 2023-09-29 DIAGNOSIS — Z170 Estrogen receptor positive status [ER+]: Secondary | ICD-10-CM | POA: Diagnosis not present

## 2023-09-29 DIAGNOSIS — Z510 Encounter for antineoplastic radiation therapy: Secondary | ICD-10-CM | POA: Diagnosis not present

## 2023-09-29 DIAGNOSIS — C50511 Malignant neoplasm of lower-outer quadrant of right female breast: Secondary | ICD-10-CM | POA: Diagnosis not present

## 2023-09-29 LAB — RAD ONC ARIA SESSION SUMMARY
Course Elapsed Days: 6
Plan Fractions Treated to Date: 5
Plan Prescribed Dose Per Fraction: 2.67 Gy
Plan Total Fractions Prescribed: 15
Plan Total Prescribed Dose: 40.05 Gy
Reference Point Dosage Given to Date: 13.35 Gy
Reference Point Session Dosage Given: 2.67 Gy
Session Number: 5

## 2023-10-01 ENCOUNTER — Other Ambulatory Visit: Payer: Self-pay

## 2023-10-01 ENCOUNTER — Ambulatory Visit
Admission: RE | Admit: 2023-10-01 | Discharge: 2023-10-01 | Disposition: A | Discharge status: Home / Self Care | Payer: Medicare HMO | Source: Ambulatory Visit | Attending: Radiation Oncology | Admitting: Radiation Oncology

## 2023-10-01 DIAGNOSIS — C50511 Malignant neoplasm of lower-outer quadrant of right female breast: Secondary | ICD-10-CM | POA: Diagnosis not present

## 2023-10-01 DIAGNOSIS — Z510 Encounter for antineoplastic radiation therapy: Secondary | ICD-10-CM | POA: Diagnosis not present

## 2023-10-01 DIAGNOSIS — Z170 Estrogen receptor positive status [ER+]: Secondary | ICD-10-CM | POA: Diagnosis not present

## 2023-10-01 LAB — RAD ONC ARIA SESSION SUMMARY
Course Elapsed Days: 8
Plan Fractions Treated to Date: 6
Plan Prescribed Dose Per Fraction: 2.67 Gy
Plan Total Fractions Prescribed: 15
Plan Total Prescribed Dose: 40.05 Gy
Reference Point Dosage Given to Date: 16.02 Gy
Reference Point Session Dosage Given: 2.67 Gy
Session Number: 6

## 2023-10-02 ENCOUNTER — Other Ambulatory Visit: Payer: Self-pay

## 2023-10-02 ENCOUNTER — Ambulatory Visit
Admission: RE | Admit: 2023-10-02 | Discharge: 2023-10-02 | Disposition: A | Discharge status: Home / Self Care | Payer: Medicare HMO | Source: Ambulatory Visit | Attending: Radiation Oncology | Admitting: Radiation Oncology

## 2023-10-02 DIAGNOSIS — C50511 Malignant neoplasm of lower-outer quadrant of right female breast: Secondary | ICD-10-CM | POA: Diagnosis not present

## 2023-10-02 DIAGNOSIS — Z170 Estrogen receptor positive status [ER+]: Secondary | ICD-10-CM | POA: Diagnosis not present

## 2023-10-02 DIAGNOSIS — Z510 Encounter for antineoplastic radiation therapy: Secondary | ICD-10-CM | POA: Diagnosis not present

## 2023-10-02 LAB — RAD ONC ARIA SESSION SUMMARY
Course Elapsed Days: 9
Plan Fractions Treated to Date: 7
Plan Prescribed Dose Per Fraction: 2.67 Gy
Plan Total Fractions Prescribed: 15
Plan Total Prescribed Dose: 40.05 Gy
Reference Point Dosage Given to Date: 18.69 Gy
Reference Point Session Dosage Given: 2.67 Gy
Session Number: 7

## 2023-10-05 ENCOUNTER — Ambulatory Visit
Admission: RE | Admit: 2023-10-05 | Discharge: 2023-10-05 | Disposition: A | Discharge status: Home / Self Care | Payer: Medicare HMO | Source: Ambulatory Visit | Attending: Radiation Oncology | Admitting: Radiation Oncology

## 2023-10-05 ENCOUNTER — Other Ambulatory Visit: Payer: Self-pay

## 2023-10-05 DIAGNOSIS — Z510 Encounter for antineoplastic radiation therapy: Secondary | ICD-10-CM | POA: Diagnosis not present

## 2023-10-05 LAB — RAD ONC ARIA SESSION SUMMARY
Course Elapsed Days: 12
Plan Fractions Treated to Date: 8
Plan Prescribed Dose Per Fraction: 2.67 Gy
Plan Total Fractions Prescribed: 15
Plan Total Prescribed Dose: 40.05 Gy
Reference Point Dosage Given to Date: 21.36 Gy
Reference Point Session Dosage Given: 2.67 Gy
Session Number: 8

## 2023-10-06 ENCOUNTER — Ambulatory Visit: Payer: Medicare HMO

## 2023-10-08 ENCOUNTER — Ambulatory Visit
Admission: RE | Admit: 2023-10-08 | Discharge: 2023-10-08 | Disposition: A | Discharge status: Home / Self Care | Payer: Medicare HMO | Source: Ambulatory Visit | Attending: Radiation Oncology | Admitting: Radiation Oncology

## 2023-10-08 ENCOUNTER — Other Ambulatory Visit: Payer: Self-pay

## 2023-10-08 ENCOUNTER — Encounter: Payer: Self-pay | Admitting: Rheumatology

## 2023-10-08 ENCOUNTER — Ambulatory Visit: Payer: Medicare HMO

## 2023-10-08 ENCOUNTER — Ambulatory Visit: Payer: Medicare HMO | Attending: Rheumatology | Admitting: Rheumatology

## 2023-10-08 VITALS — BP 129/73 | HR 72 | Resp 14 | Ht 62.0 in | Wt 150.0 lb

## 2023-10-08 DIAGNOSIS — M1612 Unilateral primary osteoarthritis, left hip: Secondary | ICD-10-CM | POA: Diagnosis not present

## 2023-10-08 DIAGNOSIS — M545 Low back pain, unspecified: Secondary | ICD-10-CM | POA: Diagnosis not present

## 2023-10-08 DIAGNOSIS — D0511 Intraductal carcinoma in situ of right breast: Secondary | ICD-10-CM

## 2023-10-08 DIAGNOSIS — K5909 Other constipation: Secondary | ICD-10-CM

## 2023-10-08 DIAGNOSIS — Z8719 Personal history of other diseases of the digestive system: Secondary | ICD-10-CM

## 2023-10-08 DIAGNOSIS — M25511 Pain in right shoulder: Secondary | ICD-10-CM

## 2023-10-08 DIAGNOSIS — I10 Essential (primary) hypertension: Secondary | ICD-10-CM

## 2023-10-08 DIAGNOSIS — M8589 Other specified disorders of bone density and structure, multiple sites: Secondary | ICD-10-CM

## 2023-10-08 DIAGNOSIS — Z87442 Personal history of urinary calculi: Secondary | ICD-10-CM

## 2023-10-08 DIAGNOSIS — M7062 Trochanteric bursitis, left hip: Secondary | ICD-10-CM

## 2023-10-08 DIAGNOSIS — Z510 Encounter for antineoplastic radiation therapy: Secondary | ICD-10-CM | POA: Insufficient documentation

## 2023-10-08 DIAGNOSIS — G8929 Other chronic pain: Secondary | ICD-10-CM

## 2023-10-08 DIAGNOSIS — Z8659 Personal history of other mental and behavioral disorders: Secondary | ICD-10-CM

## 2023-10-08 DIAGNOSIS — Z170 Estrogen receptor positive status [ER+]: Secondary | ICD-10-CM | POA: Insufficient documentation

## 2023-10-08 DIAGNOSIS — M19042 Primary osteoarthritis, left hand: Secondary | ICD-10-CM

## 2023-10-08 DIAGNOSIS — G2581 Restless legs syndrome: Secondary | ICD-10-CM

## 2023-10-08 DIAGNOSIS — M19041 Primary osteoarthritis, right hand: Secondary | ICD-10-CM

## 2023-10-08 DIAGNOSIS — M7061 Trochanteric bursitis, right hip: Secondary | ICD-10-CM

## 2023-10-08 DIAGNOSIS — D0512 Intraductal carcinoma in situ of left breast: Secondary | ICD-10-CM | POA: Diagnosis not present

## 2023-10-08 DIAGNOSIS — E785 Hyperlipidemia, unspecified: Secondary | ICD-10-CM | POA: Diagnosis not present

## 2023-10-08 DIAGNOSIS — M25512 Pain in left shoulder: Secondary | ICD-10-CM

## 2023-10-08 DIAGNOSIS — C50511 Malignant neoplasm of lower-outer quadrant of right female breast: Secondary | ICD-10-CM | POA: Diagnosis not present

## 2023-10-08 DIAGNOSIS — R0989 Other specified symptoms and signs involving the circulatory and respiratory systems: Secondary | ICD-10-CM | POA: Diagnosis not present

## 2023-10-08 DIAGNOSIS — K5790 Diverticulosis of intestine, part unspecified, without perforation or abscess without bleeding: Secondary | ICD-10-CM

## 2023-10-08 LAB — RAD ONC ARIA SESSION SUMMARY
Course Elapsed Days: 15
Plan Fractions Treated to Date: 9
Plan Prescribed Dose Per Fraction: 2.67 Gy
Plan Total Fractions Prescribed: 15
Plan Total Prescribed Dose: 40.05 Gy
Reference Point Dosage Given to Date: 24.03 Gy
Reference Point Session Dosage Given: 2.67 Gy
Session Number: 9

## 2023-10-08 MED ORDER — LIDOCAINE HCL 1 % IJ SOLN
1.5000 mL | INTRAMUSCULAR | Status: AC | PRN
  Administered 2023-10-08: 1.5 mL

## 2023-10-08 MED ORDER — TRIAMCINOLONE ACETONIDE 40 MG/ML IJ SUSP
40.0000 mg | INTRAMUSCULAR | Status: AC | PRN
  Administered 2023-10-08: 40 mg via INTRA_ARTICULAR

## 2023-10-08 NOTE — Progress Notes (Deleted)
 Office Visit Note  Patient: Alicia [name]             Date of Birth: 1941-01-17           MRN: [id number]             PCP: Auston Opal, DO Referring: Auston Opal, DO Visit Date: 10/08/2023 Occupation: @GUAROCC @  Subjective:  No chief complaint on file.   History of Present Illness: Alicia [name] is a 83 y.o. female ***     Activities of Daily Living:  Patient reports morning stiffness for *** {minute/hour:19697}.   Patient {ACTIONS;DENIES/REPORTS:21021675::Denies} nocturnal pain.  Difficulty dressing/grooming: {ACTIONS;DENIES/REPORTS:21021675::Denies} Difficulty climbing stairs: {ACTIONS;DENIES/REPORTS:21021675::Denies} Difficulty getting out of chair: {ACTIONS;DENIES/REPORTS:21021675::Denies} Difficulty using hands for taps, buttons, cutlery, and/or writing: {ACTIONS;DENIES/REPORTS:21021675::Denies}  No Rheumatology ROS completed.   PMFS History:  Patient Active Problem List   Diagnosis Date Noted   Ductal carcinoma in situ (DCIS) of right breast 09/09/2023   Genetic testing 07/14/2022   Ductal carcinoma in situ (DCIS) of left breast 06/30/2022   Primary osteoarthritis of left hip 07/02/2021   Family history of heart disease 09/12/2020   Chest pain of uncertain etiology 09/12/2020   Essential hypertension 10/12/2018   Dyslipidemia 10/12/2018   History of kidney stones 10/12/2018   History of gastroesophageal reflux (GERD) 10/12/2018   History of anxiety 10/12/2018   Osteopenia of multiple sites 10/12/2018   Diverticulosis 10/12/2018   Primary osteoarthritis of both hands 10/12/2018   RLS (restless legs syndrome) 03/28/2016    Past Medical History:  Diagnosis Date   Anginal pain (HCC)    Anxiety 2014   Bursitis    Cancer (HCC) 06/2022   left breast DCIS   Dyspnea    with exertion   GERD (gastroesophageal reflux disease) 2008   History of radiation therapy    Left breast- 09/17/22-10/14/22- Dr. Lynwood Nasuti   Hypercholesteremia 2003    Hypertension 2003   Insomnia 1988   Kidney stone    Osteoarthritis 2008   hands, knees, right hip   Osteopenia 2002   Restless leg syndrome 2000   RLS (restless legs syndrome) 03/28/2016   Vertigo 2002    Family History  Problem Relation Age of Onset   Heart attack Mother    Depression Mother    Stroke Mother    Diabetes Mother    Heart attack Father    COPD Sister    COPD Sister    COPD Brother    COPD Brother    Cancer Maternal Aunt    Breast cancer Maternal Aunt 60   Restless legs syndrome Daughter    Restless legs syndrome Son    Restless legs syndrome Son    Past Surgical History:  Procedure Laterality Date   ABDOMINAL HYSTERECTOMY     APPENDECTOMY     BLEPHAROPLASTY  2019   BREAST LUMPECTOMY WITH RADIOACTIVE SEED LOCALIZATION Left 07/16/2022   Procedure: LEFT BREAST LUMPECTOMY WITH RADIOACTIVE SEED LOCALIZATION;  Surgeon: Vernetta Berg, MD;  Location: [facility];  Service: General;  Laterality: Left;   BREAST LUMPECTOMY WITH RADIOACTIVE SEED LOCALIZATION Right 08/11/2023   Procedure: RADIOACTIVE SEED GUIDED RIGHT BREAST LUMPECTOMY;  Surgeon: Vernetta Berg, MD;  Location: [facility];  Service: General;  Laterality: Right;  LMA   CATARACT EXTRACTION Right 02/2010   CHOLECYSTECTOMY     COLONOSCOPY  2015   results normal   LEFT HEART CATH AND CORONARY ANGIOGRAPHY N/A 09/20/2020   Procedure: LEFT HEART CATH AND CORONARY ANGIOGRAPHY;  Surgeon:  Court Dorn PARAS, MD;  Location: [facility];  Service: Cardiovascular;  Laterality: N/A;   RE-EXCISION OF BREAST LUMPECTOMY Left 08/13/2022   Procedure: RE-EXCISION OF LEFT BREAST CANCER;  Surgeon: Vernetta Berg, MD;  Location: [facility];  Service: General;  Laterality: Left;   ROTATOR CUFF REPAIR Right    Social History   Social History Narrative   Lives alone in a one story home.  Has 3 children.     Semi retired.     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    Immunization History  Administered Date(s) Administered   Moderna Sars-Covid-2 Vaccination 10/19/2019, 11/15/2019, 08/13/2020, 04/04/2021     Objective: Vital Signs: There were no vitals taken for this visit.   Physical Exam Vitals and nursing note reviewed.  Constitutional:      Appearance: She is well-developed.  HENT:     Head: Normocephalic and atraumatic.  Eyes:     Conjunctiva/sclera: Conjunctivae normal.  Cardiovascular:     Rate and Rhythm: Normal rate and regular rhythm.     Heart sounds: Normal heart sounds.  Pulmonary:     Effort: Pulmonary effort is normal.     Breath sounds: Normal breath sounds.  Abdominal:     General: Bowel sounds are normal.     Palpations: Abdomen is soft.  Musculoskeletal:     Cervical back: Normal range of motion.  Lymphadenopathy:     Cervical: No cervical adenopathy.  Skin:    General: Skin is warm and dry.     Capillary Refill: Capillary refill takes less than 2 seconds.  Neurological:     Mental Status: She is alert and oriented to person, place, and time.  Psychiatric:        Behavior: Behavior normal.      Musculoskeletal Exam: ***  CDAI Exam: CDAI Score: -- Patient Global: --; Provider Global: -- Swollen: --; Tender: -- Joint Exam 10/08/2023   No joint exam has been documented for this visit   There is currently no information documented on the homunculus. Go to the Rheumatology activity and complete the homunculus joint exam.  Investigation: No additional findings.  Imaging: No results found.  Recent Labs: Lab Results  Component Value Date   WBC 6.3 08/05/2023   HGB 12.3 08/05/2023   PLT 311 08/05/2023   NA 141 08/05/2023   K 4.4 08/05/2023   CL 104 08/05/2023   CO2 27 08/05/2023   GLUCOSE 96 08/05/2023   BUN 21 08/05/2023   CREATININE 1.03 (H) 08/05/2023   BILITOT 0.2 08/05/2023   ALKPHOS 65 08/05/2023   AST 14 08/05/2023   ALT 11 08/05/2023   PROT 6.4 08/05/2023   ALBUMIN  3.8 08/05/2023    CALCIUM  10.0 08/05/2023   GFRAA >60 10/08/2018    Speciality Comments: No specialty comments available.  Procedures:  No procedures performed Allergies: Ibuprofen, Lexapro [escitalopram oxalate], Lisinopril, Mobic [meloxicam], Nabumetone, Prilosec otc [omeprazole magnesium ], Zoloft [sertraline hcl], and Sulfa antibiotics   Assessment / Plan:     Visit Diagnoses: Primary osteoarthritis of both hands  Primary osteoarthritis of left hip  Trochanteric bursitis of both hips  Chronic midline low back pain without sciatica  Osteopenia of multiple sites  Lung crackles  History of gastroesophageal reflux (GERD)  Essential hypertension  Dyslipidemia  Diverticulosis  Chronic constipation  RLS (restless legs syndrome)  History of kidney stones  History of anxiety  Orders: No orders of the defined types were placed in this encounter.  No orders of the defined types were placed in this encounter.   Face-to-face time spent with patient was *** minutes. Greater than 50% of time was spent in counseling and coordination of care.  Follow-Up Instructions: No follow-ups on file.   Maya Nash, MD  Note - This record has been created using Animal nutritionist.  Chart creation errors have been sought, but may not always  have been located. Such creation errors do not reflect on  the standard of medical care.

## 2023-10-08 NOTE — Progress Notes (Signed)
 Office Visit Note  Patient: Alicia [name]             Date of Birth: 1941/05/16           MRN: [id number]             PCP: Auston Opal, DO Referring: Auston Opal, DO Visit Date: 10/08/2023 Occupation: @GUAROCC @  Subjective:  Pain in multiple joints   History of Present Illness: Alicia [name] is a 83 y.o. female with osteoarthritis.  She states she was diagnosed with breast cancer in September 2024.  She had right breast lumpectomy followed by radiation therapy.  She was started on anastrozole  in November 2024.  She states after starting anastrozole  she started having generalized bodyaches and pains.  She could not tolerate anastrozole  and discontinued it about 2 weeks ago.  She states she still continues to have discomfort although it is improved to some extent.  She has been having a lot of pain and discomfort in her right hip which is radiating into the right leg.  She has been having discomfort in her both shoulders and her both hands.  She has not noticed any joint swelling.    Activities of Daily Living:  Patient reports morning stiffness for 5 minutes.   Patient Reports nocturnal pain.  Difficulty dressing/grooming: Denies Difficulty climbing stairs: Denies Difficulty getting out of chair: Denies Difficulty using hands for taps, buttons, cutlery, and/or writing: Reports  Review of Systems  Constitutional:  Positive for fatigue.  HENT:  Positive for mouth sores and mouth dryness.   Eyes:  Negative for dryness.  Respiratory:  Positive for shortness of breath.   Cardiovascular:  Positive for palpitations. Negative for chest pain.  Gastrointestinal:  Positive for constipation. Negative for blood in stool and diarrhea.  Endocrine: Negative for increased urination.  Genitourinary:  Negative for involuntary urination.  Musculoskeletal:  Positive for joint pain, joint pain, myalgias, morning stiffness, muscle tenderness and myalgias. Negative for gait problem, joint swelling  and muscle weakness.  Skin:  Negative for color change, rash, hair loss and sensitivity to sunlight.  Allergic/Immunologic: Positive for susceptible to infections.  Neurological:  Negative for dizziness and headaches.  Hematological:  Negative for swollen glands.  Psychiatric/Behavioral:  Positive for sleep disturbance. Negative for depressed mood. The patient is not nervous/anxious.     PMFS History:  Patient Active Problem List   Diagnosis Date Noted   Ductal carcinoma in situ (DCIS) of right breast 09/09/2023   Genetic testing 07/14/2022   Ductal carcinoma in situ (DCIS) of left breast 06/30/2022   Primary osteoarthritis of left hip 07/02/2021   Family history of heart disease 09/12/2020   Chest pain of uncertain etiology 09/12/2020   Essential hypertension 10/12/2018   Dyslipidemia 10/12/2018   History of kidney stones 10/12/2018   History of gastroesophageal reflux (GERD) 10/12/2018   History of anxiety 10/12/2018   Osteopenia of multiple sites 10/12/2018   Diverticulosis 10/12/2018   Primary osteoarthritis of both hands 10/12/2018   RLS (restless legs syndrome) 03/28/2016    Past Medical History:  Diagnosis Date   Anginal pain (HCC)    Anxiety 2014   Bursitis    Cancer (HCC) 06/2022   left breast DCIS   Dyspnea    with exertion   GERD (gastroesophageal reflux disease) 2008   History of radiation therapy    Left breast- 09/17/22-10/14/22- Dr. Lynwood Nasuti   Hypercholesteremia 2003   Hypertension 2003   Insomnia 1988   Kidney stone  Osteoarthritis 2008   hands, knees, right hip   Osteopenia 2002   Restless leg syndrome 2000   RLS (restless legs syndrome) 03/28/2016   Vertigo 2002    Family History  Problem Relation Age of Onset   Heart attack Mother    Depression Mother    Stroke Mother    Diabetes Mother    Heart attack Father    COPD Sister    COPD Sister    COPD Brother    COPD Brother    Cancer Maternal Aunt    Breast cancer Maternal Aunt 60    Restless legs syndrome Daughter    Restless legs syndrome Son    Restless legs syndrome Son    Past Surgical History:  Procedure Laterality Date   ABDOMINAL HYSTERECTOMY     APPENDECTOMY     BLEPHAROPLASTY  2019   BREAST LUMPECTOMY WITH RADIOACTIVE SEED LOCALIZATION Left 07/16/2022   Procedure: LEFT BREAST LUMPECTOMY WITH RADIOACTIVE SEED LOCALIZATION;  Surgeon: Vernetta Berg, MD;  Location: [facility];  Service: General;  Laterality: Left;   BREAST LUMPECTOMY WITH RADIOACTIVE SEED LOCALIZATION Right 08/11/2023   Procedure: RADIOACTIVE SEED GUIDED RIGHT BREAST LUMPECTOMY;  Surgeon: Vernetta Berg, MD;  Location: [facility];  Service: General;  Laterality: Right;  LMA   CATARACT EXTRACTION Right 02/2010   CHOLECYSTECTOMY     COLONOSCOPY  2015   results normal   LEFT HEART CATH AND CORONARY ANGIOGRAPHY N/A 09/20/2020   Procedure: LEFT HEART CATH AND CORONARY ANGIOGRAPHY;  Surgeon: Court Dorn PARAS, MD;  Location: [facility];  Service: Cardiovascular;  Laterality: N/A;   RE-EXCISION OF BREAST LUMPECTOMY Left 08/13/2022   Procedure: RE-EXCISION OF LEFT BREAST CANCER;  Surgeon: Vernetta Berg, MD;  Location:  SURGERY CENTER;  Service: General;  Laterality: Left;   ROTATOR CUFF REPAIR Right    Social History   Social History Narrative   Lives alone in a one story home.  Has 3 children.     Semi retired.     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   Immunization History  Administered Date(s) Administered   Moderna Sars-Covid-2 Vaccination 10/19/2019, 11/15/2019, 08/13/2020, 04/04/2021     Objective: Vital Signs: BP 129/73 (BP Location: Left Arm, Patient Position: Sitting, Cuff Size: Normal)   Pulse 72   Resp 14   Ht 5' 2 (1.575 m)   Wt 150 lb (68 kg)   BMI 27.44 kg/m    Physical Exam Vitals and nursing note reviewed.  Constitutional:      Appearance: She is well-developed.  HENT:     Head: Normocephalic and atraumatic.   Eyes:     Conjunctiva/sclera: Conjunctivae normal.  Cardiovascular:     Rate and Rhythm: Normal rate and regular rhythm.     Heart sounds: Normal heart sounds.  Pulmonary:     Effort: Pulmonary effort is normal.     Breath sounds: Normal breath sounds.  Abdominal:     General: Bowel sounds are normal.     Palpations: Abdomen is soft.  Musculoskeletal:     Cervical back: Normal range of motion.  Lymphadenopathy:     Cervical: No cervical adenopathy.  Skin:    General: Skin is warm and dry.     Capillary Refill: Capillary refill takes less than 2 seconds.  Neurological:     Mental Status: She is alert and oriented to person, place, and time.  Psychiatric:        Behavior: Behavior  normal.      Musculoskeletal Exam: She had limited lateral rotation of the cervical spine.  There was no tenderness over lumbar spine.  Shoulders were in good range of motion with discomfort over bilateral subacromial region.  Elbows and wrist joints in good range of motion.  Bilateral CMC PIP and DIP thickening with no synovitis was noted.  She had limited range of motion of bilateral hip joints with severe tenderness over right trochanteric bursa.  Knee joints in good range of motion without any warmth swelling or effusion.  There was no tenderness over ankles or MTPs.  CDAI Exam: CDAI Score: -- Patient Global: --; Provider Global: -- Swollen: --; Tender: -- Joint Exam 10/08/2023   No joint exam has been documented for this visit   There is currently no information documented on the homunculus. Go to the Rheumatology activity and complete the homunculus joint exam.  Investigation: No additional findings.  Imaging: No results found.  Recent Labs: Lab Results  Component Value Date   WBC 6.3 08/05/2023   HGB 12.3 08/05/2023   PLT 311 08/05/2023   NA 141 08/05/2023   K 4.4 08/05/2023   CL 104 08/05/2023   CO2 27 08/05/2023   GLUCOSE 96 08/05/2023   BUN 21 08/05/2023   CREATININE 1.03 (H)  08/05/2023   BILITOT 0.2 08/05/2023   ALKPHOS 65 08/05/2023   AST 14 08/05/2023   ALT 11 08/05/2023   PROT 6.4 08/05/2023   ALBUMIN  3.8 08/05/2023   CALCIUM  10.0 08/05/2023   GFRAA >60 10/08/2018    Speciality Comments: No specialty comments available.  Procedures:  Large Joint Inj: R greater trochanter on 10/08/2023 4:01 PM Indications: pain Details: 27 G 1.5 in needle, lateral approach  Arthrogram: No  Medications: 40 mg triamcinolone  acetonide 40 MG/ML; 1.5 mL lidocaine  1 % Aspirate: 0 mL Outcome: tolerated well, no immediate complications Procedure, treatment alternatives, risks and benefits explained, specific risks discussed. Consent was given by the patient. Immediately prior to procedure a time out was called to verify the correct patient, procedure, equipment, support staff and site/side marked as required. Patient was prepped and draped in the usual sterile fashion.     Allergies: Ibuprofen, Lexapro [escitalopram oxalate], Lisinopril, Mobic [meloxicam], Nabumetone, Prilosec otc [omeprazole magnesium ], Zoloft [sertraline hcl], and Sulfa antibiotics   Assessment / Plan:     Visit Diagnoses: Primary osteoarthritis of both hands-she has been experiencing increased pain and discomfort in the bilateral hands.  Bilateral PIP DIP thickening with no synovitis was noted.  She has bought a merchant navy officer which is helping her.  Joint protection muscle strengthening was discussed.  Chronic pain of both shoulders-she has been experiencing increased discomfort in bilateral shoulders.  She had tenderness over subacromial region.  Shoulder joint exercises were demonstrated in the office.  If she has persistent discomfort we can consider injections in the future.  Primary osteoarthritis of left hip-she had limited range of motion of bilateral hip joints.  Trochanteric bursitis of both hips-she had tenderness over bilateral trochanteric bursa more on the right side.  Per patient's request  after informed consent was obtained right trochanteric bursa was injected with lidocaine  and Kenalog  as described above.  Patient tolerated the procedure well.  Postprocedure instructions were given.  Patient has decided band stretches at home which she will practice.  Chronic midline low back pain without sciatica-she has intermittent lower back pain.  Osteopenia of multiple sites-she is on calcium  and vitamin D.DEXA 04/11/2021 T-score: -1.03, BMD: 0.705 left femoral  neck.  Repeat DEXA pending.  Ductal carcinoma in situ (DCIS) of right breast - 09/24, status postlumpectomy and radiation therapy.  Restarted anastrozole  November 2024 and discontinued 2 weeks ago.  Ductal carcinoma in situ (DCIS) of left breast - 09/23, status postlumpectomy, and radiation therapy.  Treated with tamoxifen  for 1 year.  Lung crackles-chest x-ray was unremarkable on June 11, 2023.  Other medical problems listed as follows:  History of gastroesophageal reflux (GERD)-discouraged the use of NSAIDs.  Essential hypertension  Dyslipidemia  Diverticulosis  Chronic constipation  RLS (restless legs syndrome)  History of kidney stones  History of anxiety  Orders: No orders of the defined types were placed in this encounter.  No orders of the defined types were placed in this encounter.    Follow-Up Instructions: Return in about 6 months (around 04/06/2024) for Osteoarthritis.   Maya Nash, MD  Note - This record has been created using Animal nutritionist.  Chart creation errors have been sought, but may not always  have been located. Such creation errors do not reflect on  the standard of medical care.

## 2023-10-09 ENCOUNTER — Other Ambulatory Visit: Payer: Self-pay

## 2023-10-09 ENCOUNTER — Ambulatory Visit
Admission: RE | Admit: 2023-10-09 | Discharge: 2023-10-09 | Disposition: A | Discharge status: Home / Self Care | Payer: Medicare HMO | Source: Ambulatory Visit | Attending: Radiation Oncology | Admitting: Radiation Oncology

## 2023-10-09 DIAGNOSIS — Z510 Encounter for antineoplastic radiation therapy: Secondary | ICD-10-CM | POA: Diagnosis not present

## 2023-10-09 DIAGNOSIS — Z170 Estrogen receptor positive status [ER+]: Secondary | ICD-10-CM | POA: Diagnosis not present

## 2023-10-09 DIAGNOSIS — C50511 Malignant neoplasm of lower-outer quadrant of right female breast: Secondary | ICD-10-CM | POA: Diagnosis not present

## 2023-10-09 LAB — RAD ONC ARIA SESSION SUMMARY
Course Elapsed Days: 16
Plan Fractions Treated to Date: 10
Plan Prescribed Dose Per Fraction: 2.67 Gy
Plan Total Fractions Prescribed: 15
Plan Total Prescribed Dose: 40.05 Gy
Reference Point Dosage Given to Date: 26.7 Gy
Reference Point Session Dosage Given: 2.67 Gy
Session Number: 10

## 2023-10-12 ENCOUNTER — Ambulatory Visit: Payer: Medicare HMO

## 2023-10-12 ENCOUNTER — Other Ambulatory Visit: Payer: Self-pay | Admitting: *Deleted

## 2023-10-12 ENCOUNTER — Telehealth: Payer: Self-pay | Admitting: Radiation Oncology

## 2023-10-12 MED ORDER — LETROZOLE 2.5 MG PO TABS: 2.5000 mg | ORAL_TABLET | Freq: Every day | ORAL | 1 refills | Status: DC

## 2023-10-12 NOTE — Progress Notes (Signed)
 Pt called to f/u after 2 weeks off anastrozole  and stated that joint pain had eased off tremendously. Per office not, pt is to start on Letrozole  2.5 mg daily. Pt called and rx sent to pt preferred pharm. Called pt to make aware of rx changes and scheduling message sent for pt to f/u in office or virtual to see how pt is doing on rx.
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
## 2023-10-13 ENCOUNTER — Ambulatory Visit
Admission: RE | Admit: 2023-10-13 | Discharge: 2023-10-13 | Disposition: A | Discharge status: Home / Self Care | Payer: Medicare HMO | Source: Ambulatory Visit | Attending: Radiation Oncology | Admitting: Radiation Oncology

## 2023-10-13 ENCOUNTER — Other Ambulatory Visit: Payer: Self-pay

## 2023-10-13 ENCOUNTER — Other Ambulatory Visit: Payer: Self-pay | Admitting: Radiation Oncology

## 2023-10-13 ENCOUNTER — Ambulatory Visit: Payer: Medicare HMO

## 2023-10-13 DIAGNOSIS — Z170 Estrogen receptor positive status [ER+]: Secondary | ICD-10-CM | POA: Diagnosis not present

## 2023-10-13 DIAGNOSIS — Z510 Encounter for antineoplastic radiation therapy: Secondary | ICD-10-CM | POA: Diagnosis not present

## 2023-10-13 DIAGNOSIS — C50511 Malignant neoplasm of lower-outer quadrant of right female breast: Secondary | ICD-10-CM | POA: Diagnosis not present

## 2023-10-13 LAB — RAD ONC ARIA SESSION SUMMARY
Course Elapsed Days: 20
Plan Fractions Treated to Date: 11
Plan Prescribed Dose Per Fraction: 2.67 Gy
Plan Total Fractions Prescribed: 15
Plan Total Prescribed Dose: 40.05 Gy
Reference Point Dosage Given to Date: 29.37 Gy
Reference Point Session Dosage Given: 2.67 Gy
Session Number: 11

## 2023-10-13 MED ORDER — TRAMADOL HCL 50 MG PO TABS: 50.0000 mg | ORAL_TABLET | Freq: Four times a day (QID) | ORAL | 0 refills | Status: DC | PRN

## 2023-10-14 ENCOUNTER — Ambulatory Visit
Admission: RE | Admit: 2023-10-14 | Discharge: 2023-10-14 | Disposition: A | Discharge status: Home / Self Care | Payer: Medicare HMO | Source: Ambulatory Visit | Attending: Radiation Oncology | Admitting: Radiation Oncology

## 2023-10-14 ENCOUNTER — Other Ambulatory Visit: Payer: Self-pay

## 2023-10-14 DIAGNOSIS — Z170 Estrogen receptor positive status [ER+]: Secondary | ICD-10-CM | POA: Diagnosis not present

## 2023-10-14 DIAGNOSIS — C50511 Malignant neoplasm of lower-outer quadrant of right female breast: Secondary | ICD-10-CM | POA: Diagnosis not present

## 2023-10-14 DIAGNOSIS — Z510 Encounter for antineoplastic radiation therapy: Secondary | ICD-10-CM | POA: Diagnosis not present

## 2023-10-14 LAB — RAD ONC ARIA SESSION SUMMARY
Course Elapsed Days: 21
Plan Fractions Treated to Date: 12
Plan Prescribed Dose Per Fraction: 2.67 Gy
Plan Total Fractions Prescribed: 15
Plan Total Prescribed Dose: 40.05 Gy
Reference Point Dosage Given to Date: 32.04 Gy
Reference Point Session Dosage Given: 2.67 Gy
Session Number: 12

## 2023-10-15 ENCOUNTER — Ambulatory Visit: Payer: Medicare HMO

## 2023-10-16 ENCOUNTER — Ambulatory Visit
Admission: RE | Admit: 2023-10-16 | Discharge: 2023-10-16 | Disposition: A | Discharge status: Home / Self Care | Payer: Medicare HMO | Source: Ambulatory Visit | Attending: Radiation Oncology | Admitting: Radiation Oncology

## 2023-10-16 ENCOUNTER — Other Ambulatory Visit: Payer: Self-pay

## 2023-10-16 ENCOUNTER — Ambulatory Visit: Payer: Medicare HMO

## 2023-10-16 DIAGNOSIS — Z510 Encounter for antineoplastic radiation therapy: Secondary | ICD-10-CM | POA: Diagnosis not present

## 2023-10-16 DIAGNOSIS — Z170 Estrogen receptor positive status [ER+]: Secondary | ICD-10-CM | POA: Diagnosis not present

## 2023-10-16 DIAGNOSIS — C50511 Malignant neoplasm of lower-outer quadrant of right female breast: Secondary | ICD-10-CM | POA: Diagnosis not present

## 2023-10-16 LAB — RAD ONC ARIA SESSION SUMMARY
Course Elapsed Days: 23
Plan Fractions Treated to Date: 13
Plan Prescribed Dose Per Fraction: 2.67 Gy
Plan Total Fractions Prescribed: 15
Plan Total Prescribed Dose: 40.05 Gy
Reference Point Dosage Given to Date: 34.71 Gy
Reference Point Session Dosage Given: 2.67 Gy
Session Number: 13

## 2023-10-19 ENCOUNTER — Telehealth: Payer: Self-pay | Admitting: Radiation Oncology

## 2023-10-19 ENCOUNTER — Other Ambulatory Visit: Payer: Self-pay

## 2023-10-19 ENCOUNTER — Ambulatory Visit: Payer: Medicare HMO

## 2023-10-19 DIAGNOSIS — C50511 Malignant neoplasm of lower-outer quadrant of right female breast: Secondary | ICD-10-CM | POA: Diagnosis not present

## 2023-10-19 DIAGNOSIS — Z510 Encounter for antineoplastic radiation therapy: Secondary | ICD-10-CM | POA: Diagnosis not present

## 2023-10-19 DIAGNOSIS — Z170 Estrogen receptor positive status [ER+]: Secondary | ICD-10-CM | POA: Diagnosis not present

## 2023-10-19 LAB — RAD ONC ARIA SESSION SUMMARY
Course Elapsed Days: 26
Plan Fractions Treated to Date: 14
Plan Prescribed Dose Per Fraction: 2.67 Gy
Plan Total Fractions Prescribed: 15
Plan Total Prescribed Dose: 40.05 Gy
Reference Point Dosage Given to Date: 37.38 Gy
Reference Point Session Dosage Given: 2.67 Gy
Session Number: 14
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
## 2023-10-20 ENCOUNTER — Telehealth: Payer: Self-pay | Admitting: Radiation Oncology

## 2023-10-20 ENCOUNTER — Ambulatory Visit: Payer: Medicare HMO

## 2023-10-20 NOTE — Telephone Encounter (Signed)
 1/14 @ 10:52 am Patient called to cancel her xrt appointment for today due to weakness.  Email sent to L3 machine and Support RTT, so they are aware.

## 2023-10-21 ENCOUNTER — Other Ambulatory Visit: Payer: Self-pay

## 2023-10-21 ENCOUNTER — Ambulatory Visit: Payer: Medicare HMO

## 2023-10-21 DIAGNOSIS — Z510 Encounter for antineoplastic radiation therapy: Secondary | ICD-10-CM | POA: Diagnosis not present

## 2023-10-21 DIAGNOSIS — C50511 Malignant neoplasm of lower-outer quadrant of right female breast: Secondary | ICD-10-CM | POA: Diagnosis not present

## 2023-10-21 DIAGNOSIS — Z170 Estrogen receptor positive status [ER+]: Secondary | ICD-10-CM | POA: Diagnosis not present

## 2023-10-21 LAB — RAD ONC ARIA SESSION SUMMARY
Course Elapsed Days: 28
Plan Fractions Treated to Date: 15
Plan Prescribed Dose Per Fraction: 2.67 Gy
Plan Total Fractions Prescribed: 15
Plan Total Prescribed Dose: 40.05 Gy
Reference Point Dosage Given to Date: 40.05 Gy
Reference Point Session Dosage Given: 2.67 Gy
Session Number: 15

## 2023-10-22 ENCOUNTER — Other Ambulatory Visit: Payer: Self-pay

## 2023-10-22 ENCOUNTER — Ambulatory Visit: Payer: Medicare HMO

## 2023-10-22 DIAGNOSIS — C50511 Malignant neoplasm of lower-outer quadrant of right female breast: Secondary | ICD-10-CM | POA: Diagnosis not present

## 2023-10-22 DIAGNOSIS — Z510 Encounter for antineoplastic radiation therapy: Secondary | ICD-10-CM | POA: Diagnosis not present

## 2023-10-22 DIAGNOSIS — Z170 Estrogen receptor positive status [ER+]: Secondary | ICD-10-CM | POA: Diagnosis not present

## 2023-10-22 DIAGNOSIS — Z008 Encounter for other general examination: Secondary | ICD-10-CM | POA: Diagnosis not present

## 2023-10-22 LAB — RAD ONC ARIA SESSION SUMMARY
Course Elapsed Days: 29
Plan Fractions Treated to Date: 1
Plan Prescribed Dose Per Fraction: 2 Gy
Plan Total Fractions Prescribed: 5
Plan Total Prescribed Dose: 10 Gy
Reference Point Dosage Given to Date: 2 Gy
Reference Point Session Dosage Given: 2 Gy
Session Number: 16

## 2023-10-23 ENCOUNTER — Ambulatory Visit
Admission: RE | Admit: 2023-10-23 | Discharge: 2023-10-23 | Disposition: A | Discharge status: Home / Self Care | Payer: Medicare HMO | Source: Ambulatory Visit | Attending: Radiation Oncology | Admitting: Radiation Oncology

## 2023-10-23 ENCOUNTER — Ambulatory Visit: Payer: Medicare HMO

## 2023-10-23 ENCOUNTER — Other Ambulatory Visit: Payer: Self-pay

## 2023-10-23 DIAGNOSIS — Z170 Estrogen receptor positive status [ER+]: Secondary | ICD-10-CM | POA: Diagnosis not present

## 2023-10-23 DIAGNOSIS — C50511 Malignant neoplasm of lower-outer quadrant of right female breast: Secondary | ICD-10-CM | POA: Diagnosis not present

## 2023-10-23 DIAGNOSIS — Z510 Encounter for antineoplastic radiation therapy: Secondary | ICD-10-CM | POA: Diagnosis not present

## 2023-10-23 LAB — RAD ONC ARIA SESSION SUMMARY
Course Elapsed Days: 30
Plan Fractions Treated to Date: 2
Plan Prescribed Dose Per Fraction: 2 Gy
Plan Total Fractions Prescribed: 5
Plan Total Prescribed Dose: 10 Gy
Reference Point Dosage Given to Date: 4 Gy
Reference Point Session Dosage Given: 2 Gy
Session Number: 17

## 2023-10-26 ENCOUNTER — Ambulatory Visit: Payer: Medicare HMO

## 2023-10-26 ENCOUNTER — Other Ambulatory Visit: Payer: Self-pay

## 2023-10-26 ENCOUNTER — Ambulatory Visit
Admission: RE | Admit: 2023-10-26 | Discharge: 2023-10-26 | Disposition: A | Discharge status: Home / Self Care | Payer: Medicare HMO | Source: Ambulatory Visit | Attending: Radiation Oncology | Admitting: Radiation Oncology

## 2023-10-26 DIAGNOSIS — Z170 Estrogen receptor positive status [ER+]: Secondary | ICD-10-CM | POA: Diagnosis not present

## 2023-10-26 DIAGNOSIS — C50511 Malignant neoplasm of lower-outer quadrant of right female breast: Secondary | ICD-10-CM | POA: Diagnosis not present

## 2023-10-26 DIAGNOSIS — Z510 Encounter for antineoplastic radiation therapy: Secondary | ICD-10-CM | POA: Diagnosis not present

## 2023-10-26 LAB — RAD ONC ARIA SESSION SUMMARY
Course Elapsed Days: 33
Plan Fractions Treated to Date: 3
Plan Prescribed Dose Per Fraction: 2 Gy
Plan Total Fractions Prescribed: 5
Plan Total Prescribed Dose: 10 Gy
Reference Point Dosage Given to Date: 6 Gy
Reference Point Session Dosage Given: 2 Gy
Session Number: 18

## 2023-10-27 ENCOUNTER — Ambulatory Visit
Admission: RE | Admit: 2023-10-27 | Discharge: 2023-10-27 | Disposition: A | Discharge status: Home / Self Care | Payer: Medicare HMO | Source: Ambulatory Visit | Attending: Radiation Oncology | Admitting: Radiation Oncology

## 2023-10-27 ENCOUNTER — Other Ambulatory Visit: Payer: Self-pay

## 2023-10-27 ENCOUNTER — Ambulatory Visit: Payer: Medicare HMO

## 2023-10-27 ENCOUNTER — Telehealth: Payer: Self-pay | Admitting: Radiation Oncology

## 2023-10-27 DIAGNOSIS — F411 Generalized anxiety disorder: Secondary | ICD-10-CM | POA: Diagnosis not present

## 2023-10-27 DIAGNOSIS — N183 Chronic kidney disease, stage 3 unspecified: Secondary | ICD-10-CM | POA: Diagnosis not present

## 2023-10-27 DIAGNOSIS — Z0000 Encounter for general adult medical examination without abnormal findings: Secondary | ICD-10-CM | POA: Diagnosis not present

## 2023-10-27 DIAGNOSIS — I10 Essential (primary) hypertension: Secondary | ICD-10-CM | POA: Diagnosis not present

## 2023-10-27 DIAGNOSIS — C50511 Malignant neoplasm of lower-outer quadrant of right female breast: Secondary | ICD-10-CM | POA: Diagnosis not present

## 2023-10-27 DIAGNOSIS — K219 Gastro-esophageal reflux disease without esophagitis: Secondary | ICD-10-CM | POA: Diagnosis not present

## 2023-10-27 DIAGNOSIS — Z510 Encounter for antineoplastic radiation therapy: Secondary | ICD-10-CM | POA: Diagnosis not present

## 2023-10-27 DIAGNOSIS — Z170 Estrogen receptor positive status [ER+]: Secondary | ICD-10-CM | POA: Diagnosis not present

## 2023-10-27 DIAGNOSIS — E782 Mixed hyperlipidemia: Secondary | ICD-10-CM | POA: Diagnosis not present

## 2023-10-27 DIAGNOSIS — G2581 Restless legs syndrome: Secondary | ICD-10-CM | POA: Diagnosis not present

## 2023-10-27 DIAGNOSIS — D0510 Intraductal carcinoma in situ of unspecified breast: Secondary | ICD-10-CM | POA: Diagnosis not present

## 2023-10-27 DIAGNOSIS — G479 Sleep disorder, unspecified: Secondary | ICD-10-CM | POA: Diagnosis not present

## 2023-10-27 LAB — RAD ONC ARIA SESSION SUMMARY
Course Elapsed Days: 34
Plan Fractions Treated to Date: 4
Plan Prescribed Dose Per Fraction: 2 Gy
Plan Total Fractions Prescribed: 5
Plan Total Prescribed Dose: 10 Gy
Reference Point Dosage Given to Date: 8 Gy
Reference Point Session Dosage Given: 2 Gy
Session Number: 19

## 2023-10-27 NOTE — Telephone Encounter (Signed)
Pt called stating she was in town for another appt and wanted to know if it was possible to receive her daily tx sooner than her 1:15pm appt today. I called Support RTT who advised to reach out to L2. I called L2, no answer. I advised pt they were most likely actively treating a pt but that I would send a message to them to contact pt if possible. Pt verbalized understanding, email sent to L2 regarding this.

## 2023-10-28 ENCOUNTER — Other Ambulatory Visit: Payer: Self-pay

## 2023-10-28 ENCOUNTER — Ambulatory Visit
Admission: RE | Admit: 2023-10-28 | Discharge: 2023-10-28 | Disposition: A | Discharge status: Home / Self Care | Payer: Medicare HMO | Source: Ambulatory Visit | Attending: Radiation Oncology | Admitting: Radiation Oncology

## 2023-10-28 DIAGNOSIS — C50511 Malignant neoplasm of lower-outer quadrant of right female breast: Secondary | ICD-10-CM | POA: Diagnosis not present

## 2023-10-28 DIAGNOSIS — Z510 Encounter for antineoplastic radiation therapy: Secondary | ICD-10-CM | POA: Diagnosis not present

## 2023-10-28 DIAGNOSIS — Z170 Estrogen receptor positive status [ER+]: Secondary | ICD-10-CM | POA: Diagnosis not present

## 2023-10-28 LAB — RAD ONC ARIA SESSION SUMMARY
Course Elapsed Days: 35
Plan Fractions Treated to Date: 5
Plan Prescribed Dose Per Fraction: 2 Gy
Plan Total Fractions Prescribed: 5
Plan Total Prescribed Dose: 10 Gy
Reference Point Dosage Given to Date: 10 Gy
Reference Point Session Dosage Given: 2 Gy
Session Number: 20

## 2023-10-29 NOTE — Radiation Completion Notes (Addendum)
  Radiation Oncology         [phone] ________________________________  Name: Alicia [name] MRN: [id number]  Date of Service: 10/28/2023  DOB: Oct 01, 1941  End of Treatment Note      Diagnosis: Intermediate grade, ER/PR positive  Intent: Curative     ==========DELIVERED PLANS==========  First Treatment Date: 2023-09-23 Last Treatment Date: 2023-10-28   Plan Name: Breast_R Site: Breast, Right Technique: 3D Mode: Photon Dose Per Fraction: 2.67 Gy Prescribed Dose (Delivered / Prescribed): 40.05 Gy / 40.05 Gy Prescribed Fxs (Delivered / Prescribed): 15 / 15   Plan Name: Breast_R_Bst Site: Breast, Right Technique: 3D Mode: Photon Dose Per Fraction: 2 Gy Prescribed Dose (Delivered / Prescribed): 10 Gy / 10 Gy Prescribed Fxs (Delivered / Prescribed): 5 / 5     ==========ON TREATMENT VISIT DATES========== 2023-09-25, 2023-10-08, 2023-10-13, 2023-10-27    See weekly On Treatment Notes in Epic for details in the Media tab (listed as Progress notes on the On Treatment Visit Dates listed above).The patient tolerated radiation. She developed fatigue and anticipated skin changes in the treatment field.   The patient will follow up in one month and will continue follow up with Dr. Pamelia Hoit as well.      Osker Mason, PAC

## 2023-11-13 DIAGNOSIS — D0511 Intraductal carcinoma in situ of right breast: Secondary | ICD-10-CM | POA: Diagnosis not present

## 2023-11-18 ENCOUNTER — Inpatient Hospital Stay: Payer: Medicare HMO | Attending: Hematology and Oncology | Admitting: Adult Health

## 2023-11-18 ENCOUNTER — Telehealth: Payer: Self-pay | Admitting: General Practice

## 2023-11-18 NOTE — Telephone Encounter (Signed)
Pt c/o medication issue:  1. Name of Medication: metoprolol succinate (TOPROL XL) 25 MG 24 hr tablet   2. How are you currently taking this medication (dosage and times per day)? 1/2 tablet twice a day  3. Are you having a reaction (difficulty breathing--STAT)?   4. What is your medication issue? Pharmacy called stating patient is out of medication because  is taking 1/2 tablet twice a day, but according to her last office note patient is to be taking 1/2 tablet once a day.  They stated if her dosage changed to 1/2 twice a day, patient will need new script sent to them at  [id number] - [place], [place] - [address]

## 2023-11-18 NOTE — Telephone Encounter (Signed)
Chart reviewed. Called and spoke with pharmacist to clarify that at last office visit patient instructions for her metoprolol did change to succinate 1/2 tab daily. Her former dose is 1/2 tablet bid of the tartrate. Since patient has not been following the directions on the bottle she has already run out of medication.  Pharmacist states she will call patient to discuss .

## 2023-11-20 NOTE — Progress Notes (Deleted)
 Office Visit Note  Patient: Alicia [name]             Date of Birth: 08-06-1941           MRN: [id number]             PCP: Debroah Loop, DO Referring: Blair Heys, MD Visit Date: 12/04/2023 Occupation: @GUAROCC @  Subjective:  No chief complaint on file.   History of Present Illness: Alicia [name] is a 83 y.o. female ***     Activities of Daily Living:  Patient reports morning stiffness for *** {minute/hour:19697}.   Patient {ACTIONS;DENIES/REPORTS:21021675::"Denies"} nocturnal pain.  Difficulty dressing/grooming: {ACTIONS;DENIES/REPORTS:21021675::"Denies"} Difficulty climbing stairs: {ACTIONS;DENIES/REPORTS:21021675::"Denies"} Difficulty getting out of chair: {ACTIONS;DENIES/REPORTS:21021675::"Denies"} Difficulty using hands for taps, buttons, cutlery, and/or writing: {ACTIONS;DENIES/REPORTS:21021675::"Denies"}  No Rheumatology ROS completed.   PMFS History:  Patient Active Problem List   Diagnosis Date Noted   Ductal carcinoma in situ (DCIS) of right breast 09/09/2023   Genetic testing 07/14/2022   Ductal carcinoma in situ (DCIS) of left breast 06/30/2022   Primary osteoarthritis of left hip 07/02/2021   Family history of heart disease 09/12/2020   Chest pain of uncertain etiology 09/12/2020   Essential hypertension 10/12/2018   Dyslipidemia 10/12/2018   History of kidney stones 10/12/2018   History of gastroesophageal reflux (GERD) 10/12/2018   History of anxiety 10/12/2018   Osteopenia of multiple sites 10/12/2018   Diverticulosis 10/12/2018   Primary osteoarthritis of both hands 10/12/2018   RLS (restless legs syndrome) 03/28/2016    Past Medical History:  Diagnosis Date   Anginal pain (HCC)    Anxiety 2014   Bursitis    Cancer (HCC) 06/2022   left breast DCIS   Dyspnea    with exertion   GERD (gastroesophageal reflux disease) 2008   History of radiation therapy    Left breast- 09/17/22-10/14/22- Dr. Antony Blackbird   Hypercholesteremia 2003    Hypertension 2003   Insomnia 1988   Kidney stone    Osteoarthritis 2008   hands, knees, right hip   Osteopenia 2002   Restless leg syndrome 2000   RLS (restless legs syndrome) 03/28/2016   Vertigo 2002    Family History  Problem Relation Age of Onset   Heart attack Mother    Depression Mother    Stroke Mother    Diabetes Mother    Heart attack Father    COPD Sister    COPD Sister    COPD Brother    COPD Brother    Cancer Maternal Aunt    Breast cancer Maternal Aunt 60   Restless legs syndrome Daughter    Restless legs syndrome Son    Restless legs syndrome Son    Past Surgical History:  Procedure Laterality Date   ABDOMINAL HYSTERECTOMY     APPENDECTOMY     BLEPHAROPLASTY  2019   BREAST LUMPECTOMY WITH RADIOACTIVE SEED LOCALIZATION Left 07/16/2022   Procedure: LEFT BREAST LUMPECTOMY WITH RADIOACTIVE SEED LOCALIZATION;  Surgeon: Abigail Miyamoto, MD;  Location:  SURGERY CENTER;  Service: General;  Laterality: Left;   BREAST LUMPECTOMY WITH RADIOACTIVE SEED LOCALIZATION Right 08/11/2023   Procedure: RADIOACTIVE SEED GUIDED RIGHT BREAST LUMPECTOMY;  Surgeon: Abigail Miyamoto, MD;  Location: [facility];  Service: General;  Laterality: Right;  LMA   CATARACT EXTRACTION Right 02/2010   CHOLECYSTECTOMY     COLONOSCOPY  2015   results normal   LEFT HEART CATH AND CORONARY ANGIOGRAPHY N/A 09/20/2020   Procedure: LEFT HEART CATH AND CORONARY ANGIOGRAPHY;  Surgeon:  Runell Gess, MD;  Location: [facility];  Service: Cardiovascular;  Laterality: N/A;   RE-EXCISION OF BREAST LUMPECTOMY Left 08/13/2022   Procedure: RE-EXCISION OF LEFT BREAST CANCER;  Surgeon: Abigail Miyamoto, MD;  Location: [facility];  Service: General;  Laterality: Left;   ROTATOR CUFF REPAIR Right    Social History   Social History Narrative   Lives alone in a one story home.  Has 3 children.     Semi retired.     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    Immunization History  Administered Date(s) Administered   Moderna Sars-Covid-2 Vaccination 10/19/2019, 11/15/2019, 08/13/2020, 04/04/2021     Objective: Vital Signs: There were no vitals taken for this visit.   Physical Exam   Musculoskeletal Exam: ***  CDAI Exam: CDAI Score: -- Patient Global: --; Provider Global: -- Swollen: --; Tender: -- Joint Exam 12/04/2023   No joint exam has been documented for this visit   There is currently no information documented on the homunculus. Go to the Rheumatology activity and complete the homunculus joint exam.  Investigation: No additional findings.  Imaging: No results found.  Recent Labs: Lab Results  Component Value Date   WBC 6.3 08/05/2023   HGB 12.3 08/05/2023   PLT 311 08/05/2023   NA 141 08/05/2023   K 4.4 08/05/2023   CL 104 08/05/2023   CO2 27 08/05/2023   GLUCOSE 96 08/05/2023   BUN 21 08/05/2023   CREATININE 1.03 (H) 08/05/2023   BILITOT 0.2 08/05/2023   ALKPHOS 65 08/05/2023   AST 14 08/05/2023   ALT 11 08/05/2023   PROT 6.4 08/05/2023   ALBUMIN 3.8 08/05/2023   CALCIUM 10.0 08/05/2023   GFRAA >60 10/08/2018    Speciality Comments: No specialty comments available.  Procedures:  No procedures performed Allergies: Ibuprofen, Lexapro [escitalopram oxalate], Lisinopril, Mobic [meloxicam], Nabumetone, Prilosec otc [omeprazole magnesium], Zoloft [sertraline hcl], and Sulfa antibiotics   Assessment / Plan:     Visit Diagnoses: No diagnosis found.  Orders: No orders of the defined types were placed in this encounter.  No orders of the defined types were placed in this encounter.   Face-to-face time spent with patient was *** minutes. Greater than 50% of time was spent in counseling and coordination of care.  Follow-Up Instructions: No follow-ups on file.   Ellen Henri, CMA  Note - This record has been created using Animal nutritionist.  Chart creation errors have been sought, but may not always   have been located. Such creation errors do not reflect on  the standard of medical care.

## 2023-11-24 ENCOUNTER — Inpatient Hospital Stay: Payer: Medicare HMO | Attending: Hematology and Oncology | Admitting: Adult Health

## 2023-11-24 ENCOUNTER — Telehealth: Payer: Self-pay | Admitting: *Deleted

## 2023-11-24 VITALS — BP 129/63 | HR 79 | Temp 97.8°F | Resp 18 | Ht 62.0 in | Wt 147.0 lb

## 2023-11-24 DIAGNOSIS — Z923 Personal history of irradiation: Secondary | ICD-10-CM | POA: Diagnosis not present

## 2023-11-24 DIAGNOSIS — Z87891 Personal history of nicotine dependence: Secondary | ICD-10-CM | POA: Insufficient documentation

## 2023-11-24 DIAGNOSIS — Z79811 Long term (current) use of aromatase inhibitors: Secondary | ICD-10-CM | POA: Diagnosis not present

## 2023-11-24 DIAGNOSIS — D0511 Intraductal carcinoma in situ of right breast: Secondary | ICD-10-CM

## 2023-11-24 DIAGNOSIS — D0512 Intraductal carcinoma in situ of left breast: Secondary | ICD-10-CM | POA: Insufficient documentation

## 2023-11-24 MED ORDER — LETROZOLE 2.5 MG PO TABS: 2.5000 mg | ORAL_TABLET | Freq: Every day | ORAL | 3 refills | Status: DC

## 2023-11-24 NOTE — Progress Notes (Unsigned)
SURVIVORSHIP VISIT:  BRIEF ONCOLOGIC HISTORY:  Oncology History  Ductal carcinoma in situ (DCIS) of left breast  06/19/2022 Initial Diagnosis   Screening detected distortion with calcifications in the left breast central 12 o'clock position measuring 1.9 cm.  No ultrasound correlate.  Axilla negative.  Stereotactic biopsy revealed intermediate grade DCIS cribriform and papillary features ER 100%, PR 60%   07/02/2022 Cancer Staging   Staging form: Breast, AJCC 8th Edition - Clinical: Stage 0 (cTis (DCIS), cN0, cM0, G2, ER+, PR+) - Signed by [name], MD on 07/02/2022 Stage prefix: Initial diagnosis Histologic grading system: 3 grade system    Genetic Testing   [facility] CustomNext Panel was Negative. Report date is 07/11/2022.  The CustomNext-Cancer+RNAinsight panel offered by [employer] includes sequencing and rearrangement analysis for the following 47 genes:  APC, ATM, AXIN2, BARD1, BMPR1A, BRCA1, BRCA2, BRIP1, CDH1, CDK4, CDKN2A, CHEK2, CTNNA1, DICER1, EPCAM, GREM1, HOXB13, KIT, MEN1, MLH1, MSH2, MSH3, MSH6, MUTYH, NBN, NF1, NTHL1, PALB2, PDGFRA, PMS2, POLD1, POLE, PTEN, RAD50, RAD51C, RAD51D, SDHA, SDHB, SDHC, SDHD, SMAD4, SMARCA4, STK11, TP53, TSC1, TSC2, and VHL.  RNA data is routinely analyzed for use in variant interpretation for all genes.   07/16/2022 Surgery   Left lumpectomy: DCIS cribriform and papillary types with focal necrosis grade 3 involving a complex sclerosing lesion, margins focally positive with superior/posterior margin ER 100%, PR 60%   08/13/2022 Surgery   Reexcision of the margins: Benign   09/17/2022 - 10/14/2022 Radiation Therapy   Site Technique Total Dose (Gy) Dose per Fx (Gy) Completed Fx Beam Energies  Breast, Left: Breast_L 3D 42.72/42.72 2.67 16/16 6XFFF     10/2022 - 07/2023 Anti-estrogen oral therapy   Tamoxifen     08/20/2023 Surgery   Right lumpectomy: Grade 2 DCIS 1.7 cm, margins negative, ER 95%, PR 90%    08/25/2023 - 09/23/2023 Anti-estrogen  oral therapy   Anastrozole 1 mg daily   09/23/2023 - 10/28/2023 Radiation Therapy   Plan Name: Breast_R Site: Breast, Right Technique: 3D Mode: Photon Dose Per Fraction: 2.67 Gy Prescribed Dose (Delivered / Prescribed): 40.05 Gy / 40.05 Gy Prescribed Fxs (Delivered / Prescribed): 15 / 15   Plan Name: Breast_R_Bst Site: Breast, Right Technique: 3D Mode: Photon Dose Per Fraction: 2 Gy Prescribed Dose (Delivered / Prescribed): 10 Gy / 10 Gy Prescribed Fxs (Delivered / Prescribed): 5 / 5   10/2023 -  Anti-estrogen oral therapy   2.5 mg Letrozole     INTERVAL HISTORY:   Discussed the use of AI scribe software for clinical note transcription with the patient, who gave verbal consent to proceed.  Alicia [name] has a history of stage zero breast cancer, presents for a follow-up visit after a recent lumpectomy and radiation therapy. She reports that she is tolerating letrozole well, with no significant side effects mentioned. She also mentions a need for a prescription refill for letrozole, which the doctor confirms will be sent to the pharmacy. She also mentions a cancelled appointment with another doctor, which is not of concern as the issues were addressed in the current visit.   REVIEW OF SYSTEMS:  Review of Systems  Constitutional:  Negative for appetite change, chills, fatigue, fever and unexpected weight change.  HENT:   Negative for hearing loss, lump/mass and trouble swallowing.   Eyes:  Negative for eye problems and icterus.  Respiratory:  Negative for chest tightness, cough and shortness of breath.   Cardiovascular:  Negative for chest pain, leg swelling and palpitations.  Gastrointestinal:  Negative for abdominal distention,  abdominal pain, constipation, diarrhea, nausea and vomiting.  Endocrine: Negative for hot flashes.  Genitourinary:  Negative for difficulty urinating.   Musculoskeletal:  Negative for arthralgias.  Skin:  Negative for itching and rash.  Neurological:  Negative  for dizziness, extremity weakness, headaches and numbness.  Hematological:  Negative for adenopathy. Does not bruise/bleed easily.  Psychiatric/Behavioral:  Negative for depression. The patient is not nervous/anxious.   Breast: Denies any new nodularity, masses, tenderness, nipple changes, or nipple discharge.       PAST MEDICAL/SURGICAL HISTORY:  Past Medical History:  Diagnosis Date   Anginal pain (HCC)    Anxiety 2014   Bursitis    Cancer (HCC) 06/2022   left breast DCIS   Dyspnea    with exertion   GERD (gastroesophageal reflux disease) 2008   History of radiation therapy    Left breast- 09/17/22-10/14/22- Dr. Antony Blackbird   Hypercholesteremia 2003   Hypertension 2003   Insomnia 1988   Kidney stone    Osteoarthritis 2008   hands, knees, right hip   Osteopenia 2002   Restless leg syndrome 2000   RLS (restless legs syndrome) 03/28/2016   Vertigo 2002   Past Surgical History:  Procedure Laterality Date   ABDOMINAL HYSTERECTOMY     APPENDECTOMY     BLEPHAROPLASTY  2019   BREAST LUMPECTOMY WITH RADIOACTIVE SEED LOCALIZATION Left 07/16/2022   Procedure: LEFT BREAST LUMPECTOMY WITH RADIOACTIVE SEED LOCALIZATION;  Surgeon: Abigail Miyamoto, MD;  Location: [facility];  Service: General;  Laterality: Left;   BREAST LUMPECTOMY WITH RADIOACTIVE SEED LOCALIZATION Right 08/11/2023   Procedure: RADIOACTIVE SEED GUIDED RIGHT BREAST LUMPECTOMY;  Surgeon: Abigail Miyamoto, MD;  Location: [facility] OR;  Service: General;  Laterality: Right;  LMA   CATARACT EXTRACTION Right 02/2010   CHOLECYSTECTOMY     COLONOSCOPY  2015   results normal   LEFT HEART CATH AND CORONARY ANGIOGRAPHY N/A 09/20/2020   Procedure: LEFT HEART CATH AND CORONARY ANGIOGRAPHY;  Surgeon: Runell Gess, MD;  [facility];  Service: Cardiovascular;  Laterality: N/A;   RE-EXCISION OF BREAST LUMPECTOMY Left 08/13/2022   Procedure: RE-EXCISION OF LEFT BREAST CANCER;  Surgeon: Abigail Miyamoto, MD;  Location: [facility];  Service: General;  Laterality: Left;   ROTATOR CUFF REPAIR Right      ALLERGIES:  Allergies  Allergen Reactions   Ibuprofen Other (See Comments)    GI UPSET   Lexapro [Escitalopram Oxalate] Other (See Comments)    GERD   Lisinopril Cough   Mobic [Meloxicam] Other (See Comments)    LEG PAIN   Nabumetone Other (See Comments)    GI UPSET   Prilosec Otc [Omeprazole Magnesium] Other (See Comments)    ARM SPASM   Zoloft [Sertraline Hcl] Other (See Comments)    INSOMNIA   Sulfa Antibiotics Rash     CURRENT MEDICATIONS:  Outpatient Encounter Medications as of 11/24/2023  Medication Sig   acetaminophen (TYLENOL) 650 MG CR tablet Take 1,300 mg by mouth every 8 (eight) hours as needed for pain.   Bioflavonoid Products (BIOFLEX PO) Take 1 tablet by mouth daily.   Biotin 5000 MCG TABS Take by mouth daily.   Calcium Magnesium Zinc 333-133-5 MG TABS Take by mouth daily.   COMIRNATY syringe Inject 0.3 mLs into the muscle once. (Patient not taking: Reported on 10/08/2023)   cyanocobalamin (VITAMIN B12) 1000 MCG tablet Take 1,000 mcg by mouth daily.   famotidine-calcium carbonate-magnesium hydroxide (PEPCID COMPLETE) 10-800-165 MG chewable tablet  Chew 1 tablet by mouth as needed.   Flax Oil-Fish Oil-Borage Oil (FISH-FLAX-BORAGE PO) Take 1 capsule by mouth daily.   fluticasone (FLONASE) 50 MCG/ACT nasal spray Place 1 spray into both nostrils daily. (Patient taking differently: Place 1 spray into both nostrils daily as needed for allergies.)   irbesartan (AVAPRO) 300 MG tablet Take 300 mg by mouth daily.   isosorbide mononitrate (IMDUR) 30 MG 24 hr tablet Take 1/2 (one-half) tablet by mouth once daily   letrozole (FEMARA) 2.5 MG tablet Take 1 tablet (2.5 mg total) by mouth daily.   LORazepam (ATIVAN) 0.5 MG tablet Take 0.5 mg by mouth 2 (two) times daily as needed for anxiety.   meclizine (ANTIVERT) 25 MG tablet Take 25 mg by mouth 3 (three) times  daily as needed for dizziness. (Patient not taking: Reported on 10/08/2023)   metoprolol succinate (TOPROL XL) 25 MG 24 hr tablet Take 0.5 tablets (12.5 mg total) by mouth daily.   Misc Natural Products (OSTEO BI-FLEX TRIPLE STRENGTH) TABS Take by mouth daily.   Multiple Minerals-Vitamins (CAL MAG ZINC +D3 PO) Take 1 tablet by mouth daily.   multivitamin-lutein (OCUVITE-LUTEIN) CAPS Take 830 capsules by mouth daily.   omeprazole (PRILOSEC) 40 MG capsule Take 40 mg by mouth daily.   rOPINIRole (REQUIP) 1 MG tablet Take 1 tablet (1 mg total) by mouth 3 (three) times daily. (Patient taking differently: Take 1-2 mg by mouth See admin instructions. Take 1 mg by mouth in the morning & take 2 mg at night.)   simvastatin (ZOCOR) 20 MG tablet Take 20 mg by mouth every evening.   traMADol (ULTRAM) 50 MG tablet Take 1 tablet (50 mg total) by mouth every 6 (six) hours as needed for moderate pain (pain score 4-6) or severe pain (pain score 7-10).   [DISCONTINUED] anastrozole (ARIMIDEX) 1 MG tablet Take 1 tablet (1 mg total) by mouth daily. (Patient not taking: Reported on 10/08/2023)   [DISCONTINUED] letrozole (FEMARA) 2.5 MG tablet Take 1 tablet (2.5 mg total) by mouth daily.   No facility-administered encounter medications on file as of 11/24/2023.     ONCOLOGIC FAMILY HISTORY:  Family History  Problem Relation Age of Onset   Heart attack Mother    Depression Mother    Stroke Mother    Diabetes Mother    Heart attack Father    COPD Sister    COPD Sister    COPD Brother    COPD Brother    Cancer Maternal Aunt    Breast cancer Maternal Aunt 60   Restless legs syndrome Daughter    Restless legs syndrome Son    Restless legs syndrome Son      SOCIAL HISTORY:  Social History   Socioeconomic History   Marital status: Widowed    Spouse name: Not on file   Number of children: Not on file   Years of education: Not on file   Highest education level: Not on file  Occupational History   Not on file   Tobacco Use   Smoking status: Former    Current packs/day: 0.00    Average packs/day: 0.5 packs/day for 20.0 years (10.0 ttl pk-yrs)    Types: Cigarettes    Start date: 10/06/1976    Quit date: 10/06/1996    Years since quitting: 27.1    Passive exposure: Past   Smokeless tobacco: Never  Vaping Use   Vaping status: Never Used  Substance and Sexual Activity   Alcohol use: No    Alcohol/week:  0.0 standard drinks of alcohol   Drug use: No   Sexual activity: Not Currently    Birth control/protection: Surgical    Comment: hyst  Other Topics Concern   Not on file  Social History Narrative   Lives alone in a one story home.  Has 3 children.     Semi retired.     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   Social Drivers of Corporate investment banker Strain: Not on file  Food Insecurity: No Food Insecurity (09/09/2023)   Hunger Vital Sign    Worried About Running Out of Food in the Last Year: Never true    Ran Out of Food in the Last Year: Never true  Transportation Needs: No Transportation Needs (09/09/2023)   PRAPARE - Administrator, Civil Service (Medical): No    Lack of Transportation (Non-Medical): No  Physical Activity: Not on file  Stress: Not on file  Social Connections: Not on file  Intimate Partner Violence: Not At Risk (09/09/2023)   Humiliation, Afraid, Rape, and Kick questionnaire    Fear of Current or Ex-Partner: No    Emotionally Abused: No    Physically Abused: No    Sexually Abused: No     OBSERVATIONS/OBJECTIVE:  BP 129/63 (BP Location: Left Arm, Patient Position: Sitting)   Pulse 79   Temp 97.8 F (36.6 C) (Temporal)   Resp 18   Ht 5\' 2"  (1.575 m)   Wt 147 lb (66.7 kg)   SpO2 97%   BMI 26.89 kg/m  GENERAL: Patient is a well appearing female in no acute distress HEENT:  Sclerae anicteric.  Oropharynx clear and moist. No ulcerations or evidence of oropharyngeal candidiasis. Neck is supple.  NODES:  No cervical,  supraclavicular, or axillary lymphadenopathy palpated.  BREAST EXAM:  left breast s/p lumpectomy and radiation, no sign of local recurrence, right breast benign LUNGS:  Clear to auscultation bilaterally.  No wheezes or rhonchi. HEART:  Regular rate and rhythm. No murmur appreciated. ABDOMEN:  Soft, nontender.  Positive, normoactive bowel sounds. No organomegaly palpated. MSK:  No focal spinal tenderness to palpation. Full range of motion bilaterally in the upper extremities. EXTREMITIES:  No peripheral edema.   SKIN:  Clear with no obvious rashes or skin changes. No nail dyscrasia. NEURO:  Nonfocal. Well oriented.  Appropriate affect.   LABORATORY DATA:  None for this visit.  DIAGNOSTIC IMAGING:  None for this visit.      ASSESSMENT AND PLAN:  Ms.. Gammell is a pleasant 83 y.o. female with Stage 0 right breast DCIS, ER+/PR+, diagnosed in 06/2022, treated with lumpectomy, adjuvant radiation therapy, and anti-estrogen therapy with Letrozole beginning in 10/2023.  She presents to the Survivorship [facility] for our initial meeting and routine follow-up post-completion of treatment for breast cancer.    1. Stage 0 right breast cancer:  Ms. Hoff is continuing to recover from definitive treatment for breast cancer. She will follow-up with her medical oncologist, Dr.  Pamelia Hoit in 6 months with history and physical exam per surveillance protocol.  She will continue her anti-estrogen therapy with Letrozole. Thus far, she is tolerating the Letrozole well, with minimal side effects. Her mammogram is due 06/2024; orders placed today.   Today, a comprehensive survivorship care plan and treatment summary was reviewed with the patient today detailing her breast cancer diagnosis, treatment course, potential late/long-term effects of treatment, appropriate follow-up care with recommendations for the future, and patient education resources.  A copy  of this summary, along with a letter will be sent to the patient's  primary care provider via mail/fax/In Basket message after today's visit.    2. Bone health:  Given Ms. Szuch's age/history of breast cancer and her current treatment regimen including anti-estrogen therapy with Letrozole, she is at risk for bone demineralization.  Her last DEXA scan was 04/2021, which showed mild osteopenia with a t score of -1.03 in the right femoral neck.  Patient is unsure if she has had repeat bone density.  She was given education on specific activities to promote bone health.  3. Cancer screening:  Due to Ms. Bartol's history and her age, she should receive screening for skin cancers.  The information and recommendations are listed on the patient's comprehensive care plan/treatment summary and were reviewed in detail with the patient.    4. Health maintenance and wellness promotion: Ms. Kludt was encouraged to consume 5-7 servings of fruits and vegetables per day. We reviewed the "Nutrition Rainbow" handout.  She was also encouraged to engage in moderate to vigorous exercise for 30 minutes per day most days of the week.  She was instructed to limit her alcohol consumption and continue to abstain from tobacco use.     5. Support services/counseling: It is not uncommon for this period of the patient's cancer care trajectory to be one of many emotions and stressors.   She was given information regarding our available services and encouraged to contact me with any questions or for help enrolling in any of our support group/programs.    Follow up instructions:    -Return to cancer center in 6 months for f/u with Dr. Pamelia Hoit  -Mammogram due in 06/2024 -will call [facility] and see if she had repeat DEXA in 04/2023 and if not will place orders for repeat bone density testing -She is welcome to return back to the Survivorship Clinic at any time; no additional follow-up needed at this time.  -Consider referral back to survivorship as a long-term survivor for continued surveillance  The  patient was provided an opportunity to ask questions and all were answered. The patient agreed with the plan and demonstrated an understanding of the instructions.   Total encounter time:40 minutes*in face-to-face visit time, chart review, lab review, care coordination, order entry, and documentation of the encounter time.    Lillard Anes, NP 11/27/23 10:08 AM Medical Oncology and Hematology [facility] [address] [place], [place] [address] Tel. [phone]    Fax. [phone]  *Total Encounter Time as defined by the Centers for Medicare and Medicaid Services includes, in addition to the face-to-face time of a patient visit (documented in the note above) non-face-to-face time: obtaining and reviewing outside history, ordering and reviewing medications, tests or procedures, care coordination (communications with other health care professionals or caregivers) and documentation in the medical record.

## 2023-11-24 NOTE — Telephone Encounter (Signed)
Most recent bone density requested from [facility].

## 2023-11-25 ENCOUNTER — Inpatient Hospital Stay: Payer: Medicare HMO | Admitting: Hematology and Oncology

## 2023-11-27 ENCOUNTER — Encounter: Payer: Self-pay | Admitting: Adult Health

## 2023-12-04 ENCOUNTER — Ambulatory Visit: Payer: Medicare HMO | Admitting: Rheumatology

## 2023-12-04 ENCOUNTER — Telehealth: Payer: Self-pay

## 2023-12-04 ENCOUNTER — Encounter: Payer: Self-pay | Admitting: Radiation Oncology

## 2023-12-04 DIAGNOSIS — M19041 Primary osteoarthritis, right hand: Secondary | ICD-10-CM

## 2023-12-04 DIAGNOSIS — I10 Essential (primary) hypertension: Secondary | ICD-10-CM

## 2023-12-04 DIAGNOSIS — K5909 Other constipation: Secondary | ICD-10-CM

## 2023-12-04 DIAGNOSIS — M7061 Trochanteric bursitis, right hip: Secondary | ICD-10-CM

## 2023-12-04 DIAGNOSIS — R0989 Other specified symptoms and signs involving the circulatory and respiratory systems: Secondary | ICD-10-CM

## 2023-12-04 DIAGNOSIS — M8589 Other specified disorders of bone density and structure, multiple sites: Secondary | ICD-10-CM

## 2023-12-04 DIAGNOSIS — G8929 Other chronic pain: Secondary | ICD-10-CM

## 2023-12-04 DIAGNOSIS — D0511 Intraductal carcinoma in situ of right breast: Secondary | ICD-10-CM

## 2023-12-04 DIAGNOSIS — G2581 Restless legs syndrome: Secondary | ICD-10-CM

## 2023-12-04 DIAGNOSIS — Z8719 Personal history of other diseases of the digestive system: Secondary | ICD-10-CM

## 2023-12-04 DIAGNOSIS — M545 Low back pain, unspecified: Secondary | ICD-10-CM

## 2023-12-04 DIAGNOSIS — K5790 Diverticulosis of intestine, part unspecified, without perforation or abscess without bleeding: Secondary | ICD-10-CM

## 2023-12-04 DIAGNOSIS — Z87442 Personal history of urinary calculi: Secondary | ICD-10-CM

## 2023-12-04 DIAGNOSIS — E785 Hyperlipidemia, unspecified: Secondary | ICD-10-CM

## 2023-12-04 DIAGNOSIS — D0512 Intraductal carcinoma in situ of left breast: Secondary | ICD-10-CM

## 2023-12-04 DIAGNOSIS — Z8659 Personal history of other mental and behavioral disorders: Secondary | ICD-10-CM

## 2023-12-04 DIAGNOSIS — M1612 Unilateral primary osteoarthritis, left hip: Secondary | ICD-10-CM

## 2023-12-04 NOTE — Telephone Encounter (Signed)
 Patient contacted the office and states she thought her appointment today was at 1:20 not 11:20. Patient inquired if she could schedule another appointment. Patient schedule for 12/31/2023 at 9:40.

## 2023-12-05 NOTE — Progress Notes (Signed)
  Radiation Oncology         [phone] ________________________________  Name: Alicia [name] MRN: [id number]  Date: 12/07/2023  DOB: March 08, 1941  End of Treatment Note  Diagnosis: Stage 0 (cTis (DCIS), cN0, cM0) Right Breast LOQ, Intermediate grade DCIS, ER+ / PR+ / Her2 not assessed: s/p lumpectomy without nodal biopsies.    History of Stage 0 (cTis (DCIS), cN0, cM0) Left Breast, Intermediate to high-grade DCIS, ER+ / PR+ / Her2 not assessed, s/p lumpectomy, adjuvant radiation therapy, and antiestrogen therapy (tamoxifen).      Indication for treatment: Curative        Radiation treatment dates: First Treatment Date: 2023-09-23 - Last Treatment Date: 2023-10-28  Site/Dose/Technique/Mode:   Site: Right Breast  Technique: 3D Mode: Photon Dose Per Fraction: 2.67 Gy Prescribed Dose (Delivered / Prescribed): 40.05 Gy / 40.05 Gy Prescribed Fxs (Delivered / Prescribed): 15 / 15   Site: Right Breast - Boost  Technique: 3D Mode: Photon Dose Per Fraction: 2 Gy Prescribed Dose (Delivered / Prescribed): 10 Gy / 10 Gy Prescribed Fxs (Delivered / Prescribed): 5 / 5  Narrative: The patient tolerated radiation treatment relatively well. During her final weekly treatment check on 10/27/23, the patient endorsed: a pulling sensation to her right breast, mild hyperpigmentation changes, and some fatigue. Physical exam performed on that same date showed some hyperpigmentations changes and mild erythema to the right breast area. No skin breakdown was appreciated.    Plan: The patient has completed radiation treatment. The patient will return to radiation oncology clinic for routine followup in one month. I advised them to call or return sooner if they have any questions or concerns related to their recovery or treatment.  -----------------------------------  Billie Lade, PhD, MD  This document serves as a record of services personally performed by Antony Blackbird, MD. It was created on his behalf  by Neena Rhymes, a trained medical scribe. The creation of this record is based on the scribe's personal observations and the provider's statements to them. This document has been checked and approved by the attending provider.

## 2023-12-05 NOTE — Progress Notes (Signed)
 Radiation Oncology         [phone] ________________________________  Name: Alicia [name] MRN: [id number]  Date: 12/07/2023  DOB: [birth year]  Follow-Up Visit Note  CC: Debroah Loop, DO  [name], MD  No diagnosis found.  Diagnosis: Stage 0 (cTis (DCIS), cN0, cM0) Right Breast LOQ, Intermediate grade DCIS, ER+ / PR+ / Her2 not assessed: s/p lumpectomy without nodal biopsies.    History of Stage 0 (cTis (DCIS), cN0, cM0) Left Breast, Intermediate to high-grade DCIS, ER+ / PR+ / Her2 not assessed, s/p lumpectomy, adjuvant radiation therapy, and antiestrogen therapy (tamoxifen).      Interval Since Last Radiation: 1 month and 9 days   Indication for treatment: Curative       Radiation treatment dates: First Treatment Date: 2023-09-23 - Last Treatment Date: 2023-10-28 Site/Dose/Technique/Mode:  Site: Right Breast  Technique: 3D Mode: Photon Dose Per Fraction: 2.67 Gy Prescribed Dose (Delivered / Prescribed): 40.05 Gy / 40.05 Gy Prescribed Fxs (Delivered / Prescribed): 15 / 15   Site: Right Breast - Boost  Technique: 3D Mode: Photon Dose Per Fraction: 2 Gy Prescribed Dose (Delivered / Prescribed): 10 Gy / 10 Gy Prescribed Fxs (Delivered / Prescribed): 5 / 5  Narrative:  The patient returns today for routine follow-up. She tolerated radiation treatment relatively well. During her final weekly treatment check on 10/27/23, the patient endorsed: a pulling sensation to her right breast, mild hyperpigmentation changes, and some fatigue. Physical exam performed on that same date showed some hyperpigmentations changes and mild erythema to the right breast area. No skin breakdown was appreciated.      She was recently seen by medical oncology on 11/24/23 to review her survivorship care plan and was noted to be tolerating letrozole well. She also recently followed up with general surgery on 11/13/23 . Physical exam performed at that time noted some nipple retraction bilaterally and  some radiation changes to the right breast. She denied any discomfort associated with these findings or otherwise.   No other significant interval history since the patient completed radiation therapy, or in the interval since began radiation therapy.   ***   Allergies:  is allergic to ibuprofen, lexapro [escitalopram oxalate], lisinopril, mobic [meloxicam], nabumetone, prilosec otc [omeprazole magnesium], zoloft [sertraline hcl], and sulfa antibiotics.  Meds: Current Outpatient Medications  Medication Sig Dispense Refill   acetaminophen (TYLENOL) 650 MG CR tablet Take 1,300 mg by mouth every 8 (eight) hours as needed for pain.     Bioflavonoid Products (BIOFLEX PO) Take 1 tablet by mouth daily.     Biotin 5000 MCG TABS Take by mouth daily.     Calcium Magnesium Zinc 333-133-5 MG TABS Take by mouth daily.     COMIRNATY syringe Inject 0.3 mLs into the muscle once. (Patient not taking: Reported on 10/08/2023)     cyanocobalamin (VITAMIN B12) 1000 MCG tablet Take 1,000 mcg by mouth daily.     famotidine-calcium carbonate-magnesium hydroxide (PEPCID COMPLETE) 10-800-165 MG chewable tablet Chew 1 tablet by mouth as needed.     Flax Oil-Fish Oil-Borage Oil (FISH-FLAX-BORAGE PO) Take 1 capsule by mouth daily.     fluticasone (FLONASE) 50 MCG/ACT nasal spray Place 1 spray into both nostrils daily. (Patient taking differently: Place 1 spray into both nostrils daily as needed for allergies.) 16 g 0   irbesartan (AVAPRO) 300 MG tablet Take 300 mg by mouth daily.     isosorbide mononitrate (IMDUR) 30 MG 24 hr tablet Take 1/2 (one-half) tablet by mouth once daily 45  tablet 3   letrozole (FEMARA) 2.5 MG tablet Take 1 tablet (2.5 mg total) by mouth daily. 90 tablet 3   LORazepam (ATIVAN) 0.5 MG tablet Take 0.5 mg by mouth 2 (two) times daily as needed for anxiety.     meclizine (ANTIVERT) 25 MG tablet Take 25 mg by mouth 3 (three) times daily as needed for dizziness. (Patient not taking: Reported on  10/08/2023)     metoprolol succinate (TOPROL XL) 25 MG 24 hr tablet Take 0.5 tablets (12.5 mg total) by mouth daily. 80 tablet 3   Misc Natural Products (OSTEO BI-FLEX TRIPLE STRENGTH) TABS Take by mouth daily.     Multiple Minerals-Vitamins (CAL MAG ZINC +D3 PO) Take 1 tablet by mouth daily.     multivitamin-lutein (OCUVITE-LUTEIN) CAPS Take 830 capsules by mouth daily.     omeprazole (PRILOSEC) 40 MG capsule Take 40 mg by mouth daily.     rOPINIRole (REQUIP) 1 MG tablet Take 1 tablet (1 mg total) by mouth 3 (three) times daily. (Patient taking differently: Take 1-2 mg by mouth See admin instructions. Take 1 mg by mouth in the morning & take 2 mg at night.) 270 tablet 3   simvastatin (ZOCOR) 20 MG tablet Take 20 mg by mouth every evening.     traMADol (ULTRAM) 50 MG tablet Take 1 tablet (50 mg total) by mouth every 6 (six) hours as needed for moderate pain (pain score 4-6) or severe pain (pain score 7-10). 30 tablet 0   No current facility-administered medications for this encounter.    Physical Findings: The patient is in no acute distress. Patient is alert and oriented.  vitals were not taken for this visit. .  No significant changes. Lungs are clear to auscultation bilaterally. Heart has regular rate and rhythm. No palpable cervical, supraclavicular, or axillary adenopathy. Abdomen soft, non-tender, normal bowel sounds.  Left Breast: *** no palpable mass, nipple discharge or bleeding.  Right Breast: *** no palpable mass, nipple discharge or bleeding.    Lab Findings: Lab Results  Component Value Date   WBC 6.3 08/05/2023   HGB 12.3 08/05/2023   HCT 39.0 08/05/2023   MCV 96 08/05/2023   PLT 311 08/05/2023    Radiographic Findings: No results found.  Impression: Stage 0 (cTis (DCIS), cN0, cM0) Right Breast LOQ, Intermediate grade DCIS, ER+ / PR+ / Her2 not assessed: s/p lumpectomy without nodal biopsies.    History of Stage 0 (cTis (DCIS), cN0, cM0) Left Breast, Intermediate to  high-grade DCIS, ER+ / PR+ / Her2 not assessed, s/p lumpectomy, adjuvant radiation therapy, and antiestrogen therapy (tamoxifen).      The patient is recovering from the effects of radiation.  ***  Plan:  ***   *** minutes of total time was spent for this patient encounter, including preparation, face-to-face counseling with the patient and coordination of care, physical exam, and documentation of the encounter. ____________________________________  Billie Lade, PhD, MD  This document serves as a record of services personally performed by Antony Blackbird, MD. It was created on his behalf by Neena Rhymes, a trained medical scribe. The creation of this record is based on the scribe's personal observations and the provider's statements to them. This document has been checked and approved by the attending provider.

## 2023-12-07 ENCOUNTER — Encounter: Payer: Self-pay | Admitting: Radiation Oncology

## 2023-12-07 ENCOUNTER — Ambulatory Visit
Admission: RE | Admit: 2023-12-07 | Discharge: 2023-12-07 | Disposition: A | Discharge status: Home / Self Care | Payer: Medicare HMO | Source: Ambulatory Visit | Attending: Radiation Oncology | Admitting: Radiation Oncology

## 2023-12-07 VITALS — BP 114/57 | HR 64 | Temp 97.7°F | Resp 18

## 2023-12-07 DIAGNOSIS — Z923 Personal history of irradiation: Secondary | ICD-10-CM | POA: Insufficient documentation

## 2023-12-07 DIAGNOSIS — D0511 Intraductal carcinoma in situ of right breast: Secondary | ICD-10-CM | POA: Insufficient documentation

## 2023-12-07 DIAGNOSIS — D0512 Intraductal carcinoma in situ of left breast: Secondary | ICD-10-CM | POA: Insufficient documentation

## 2023-12-07 DIAGNOSIS — Z170 Estrogen receptor positive status [ER+]: Secondary | ICD-10-CM | POA: Diagnosis not present

## 2023-12-07 NOTE — Progress Notes (Signed)
 Alicia [name] is here today for follow up post radiation to the breast.   Breast Side:Right   They completed their radiation on: 10/28/23   Does the patient complain of any of the following:        Pain: She reports intermittent pain in the nipple area. Post radiation skin issues: She reports skin improving and still using her cream. Breast Tenderness: Denies Breast Swelling: Denies Lymphadema: Denies Range of Motion limitations: No issues. Fatigue post radiation: Yes,moderate to severe. Appetite good/fair/poor: Fair  Additional comments if applicable: She reports some shortness of breath at times.  BP (!) 114/57 (BP Location: Left Arm, Patient Position: Sitting)   Pulse 64   Temp 97.7 F (36.5 C)   Resp 18   SpO2 98%

## 2023-12-23 NOTE — Progress Notes (Signed)
 Office Visit Note  Patient: Alicia [name]             Date of Birth: 11/24/1940           MRN: [id number]             PCP: Debroah Loop, DO Referring: Debroah Loop, DO Visit Date: 12/31/2023 Occupation: @GUAROCC @  Subjective:   right hip/thigh pain    History of Present Illness: Alicia [name] is a 83 y.o. female with osteoarthritis.  She states she slipped but did not fall about 1 month ago.  She has been having increased pain and discomfort in her right trochanteric region which has been radiating into the right leg.  She has difficulty laying down on the right side.  Continues to have pain and discomfort in her both hands.  She denies any discomfort in her shoulders today.  She states lower back continues to hurt.  She continues to be on calcium and vitamin D.  She continues to be on anastrozole.    Activities of Daily Living:  Patient reports morning stiffness for a few minutes.   Patient Reports nocturnal pain.  Difficulty dressing/grooming: Denies Difficulty climbing stairs: Denies Difficulty getting out of chair: Denies Difficulty using hands for taps, buttons, cutlery, and/or writing: Reports  Review of Systems  Constitutional:  Positive for fatigue.  HENT:  Positive for mouth dryness. Negative for mouth sores.   Eyes:  Negative for dryness.  Respiratory:  Positive for shortness of breath and wheezing. Negative for cough.   Cardiovascular:  Positive for palpitations. Negative for chest pain.  Gastrointestinal:  Positive for constipation. Negative for blood in stool and diarrhea.  Endocrine: Negative for increased urination.  Genitourinary:  Negative for involuntary urination.  Musculoskeletal:  Positive for joint pain, joint pain, myalgias, muscle tenderness and myalgias. Negative for gait problem, joint swelling, muscle weakness and morning stiffness.  Skin:  Negative for color change, rash, hair loss and sensitivity to sunlight.  Allergic/Immunologic: Negative for  susceptible to infections.  Neurological:  Positive for dizziness. Negative for headaches.  Hematological:  Negative for swollen glands.  Psychiatric/Behavioral:  Positive for sleep disturbance. Negative for depressed mood. The patient is not nervous/anxious.     PMFS History:  Patient Active Problem List   Diagnosis Date Noted   Ductal carcinoma in situ (DCIS) of right breast 09/09/2023   Genetic testing 07/14/2022   Ductal carcinoma in situ (DCIS) of left breast 06/30/2022   Primary osteoarthritis of left hip 07/02/2021   Family history of heart disease 09/12/2020   Chest pain of uncertain etiology 09/12/2020   Essential hypertension 10/12/2018   Dyslipidemia 10/12/2018   History of kidney stones 10/12/2018   History of gastroesophageal reflux (GERD) 10/12/2018   History of anxiety 10/12/2018   Osteopenia of multiple sites 10/12/2018   Diverticulosis 10/12/2018   Primary osteoarthritis of both hands 10/12/2018   RLS (restless legs syndrome) 03/28/2016    Past Medical History:  Diagnosis Date   Anginal pain (HCC)    Anxiety 2014   Bursitis    Cancer (HCC) 06/2022   left breast DCIS   Dyspnea    with exertion   GERD (gastroesophageal reflux disease) 2008   History of radiation therapy    Left breast- 09/17/22-10/14/22- Dr. Antony Blackbird   History of radiation therapy    Right breast-09/23/23-10/28/23- Dr. Antony Blackbird   Hypercholesteremia 2003   Hypertension 2003   Insomnia 1988   Kidney stone    Osteoarthritis 2008  hands, knees, right hip   Osteopenia 2002   Restless leg syndrome 2000   RLS (restless legs syndrome) 03/28/2016   Vertigo 2002    Family History  Problem Relation Age of Onset   Heart attack Mother    Depression Mother    Stroke Mother    Diabetes Mother    Heart attack Father    COPD Sister    COPD Sister    COPD Brother    COPD Brother    Cancer Maternal Aunt    Breast cancer Maternal Aunt 60   Restless legs syndrome Daughter    Restless  legs syndrome Son    Restless legs syndrome Son    Past Surgical History:  Procedure Laterality Date   ABDOMINAL HYSTERECTOMY     APPENDECTOMY     BLEPHAROPLASTY  2019   BREAST LUMPECTOMY WITH RADIOACTIVE SEED LOCALIZATION Left 07/16/2022   Procedure: LEFT BREAST LUMPECTOMY WITH RADIOACTIVE SEED LOCALIZATION;  Surgeon: Abigail Miyamoto, MD;  Location: [facility];  Service: General;  Laterality: Left;   BREAST LUMPECTOMY WITH RADIOACTIVE SEED LOCALIZATION Right 08/11/2023   Procedure: RADIOACTIVE SEED GUIDED RIGHT BREAST LUMPECTOMY;  Surgeon: Abigail Miyamoto, MD;  Location: [facility];  Service: General;  Laterality: Right;  LMA   CATARACT EXTRACTION Right 02/2010   CHOLECYSTECTOMY     COLONOSCOPY  2015   results normal   LEFT HEART CATH AND CORONARY ANGIOGRAPHY N/A 09/20/2020   Procedure: LEFT HEART CATH AND CORONARY ANGIOGRAPHY;  Surgeon: Runell Gess, MD;  Location: [facility];  Service: Cardiovascular;  Laterality: N/A;   RE-EXCISION OF BREAST LUMPECTOMY Left 08/13/2022   Procedure: RE-EXCISION OF LEFT BREAST CANCER;  Surgeon: Abigail Miyamoto, MD;  Location:  SURGERY CENTER;  Service: General;  Laterality: Left;   ROTATOR CUFF REPAIR Right    Social History   Social History Narrative   Lives alone in a one story home.  Has 3 children.     Semi retired.     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   Immunization History  Administered Date(s) Administered   [employer] Vaccination 10/19/2019, 11/15/2019, 08/13/2020, 04/04/2021     Objective: Vital Signs: BP 117/70 (BP Location: Left Arm, Patient Position: Sitting, Cuff Size: Normal)   Pulse 74   Resp 13   Ht 5\' 2"  (1.575 m)   Wt 149 lb (67.6 kg)   BMI 27.25 kg/m    Physical Exam Vitals and nursing note reviewed.  Constitutional:      Appearance: She is well-developed.  HENT:     Head: Normocephalic and atraumatic.  Eyes:     Conjunctiva/sclera: Conjunctivae  normal.  Cardiovascular:     Rate and Rhythm: Normal rate and regular rhythm.     Heart sounds: Normal heart sounds.  Pulmonary:     Effort: Pulmonary effort is normal.     Breath sounds: Normal breath sounds.  Abdominal:     General: Bowel sounds are normal.     Palpations: Abdomen is soft.  Musculoskeletal:     Cervical back: Normal range of motion.  Lymphadenopathy:     Cervical: No cervical adenopathy.  Skin:    General: Skin is warm and dry.     Capillary Refill: Capillary refill takes less than 2 seconds.  Neurological:     Mental Status: She is alert and oriented to person, place, and time.  Psychiatric:        Behavior: Behavior normal.  Musculoskeletal Exam: Patient had limited lateral rotation of the cervical spine without discomfort.  There was no tenderness over thoracic or lumbar spine.  Shoulders were in good range of motion without discomfort.  Elbow joints, wrist joints, MCPs were in good range of motion.  She had bilateral PIP and DIP thickening and incomplete fist formation.  She had limited range of motion of the bilateral hip joints.  She had tenderness in the right trochanteric bursa.  She also had right IT band tightness.  Knee joints in good range of motion without any warmth swelling or effusion.  There was no tenderness over ankles or MTPs.  CDAI Exam: CDAI Score: -- Patient Global: --; Provider Global: -- Swollen: --; Tender: -- Joint Exam 12/31/2023   No joint exam has been documented for this visit   There is currently no information documented on the homunculus. Go to the Rheumatology activity and complete the homunculus joint exam.  Investigation: No additional findings.  Imaging: No results found.  Recent Labs: Lab Results  Component Value Date   WBC 6.3 08/05/2023   HGB 12.3 08/05/2023   PLT 311 08/05/2023   NA 141 08/05/2023   K 4.4 08/05/2023   CL 104 08/05/2023   CO2 27 08/05/2023   GLUCOSE 96 08/05/2023   BUN 21 08/05/2023    CREATININE 1.03 (H) 08/05/2023   BILITOT 0.2 08/05/2023   ALKPHOS 65 08/05/2023   AST 14 08/05/2023   ALT 11 08/05/2023   PROT 6.4 08/05/2023   ALBUMIN 3.8 08/05/2023   CALCIUM 10.0 08/05/2023   GFRAA >60 10/08/2018    Speciality Comments: No specialty comments available.  Procedures:  Large Joint Inj: R greater trochanter on 12/31/2023 10:31 AM Indications: pain Details: 27 G 1.5 in needle, lateral approach  Arthrogram: No  Medications: 40 mg triamcinolone acetonide 40 MG/ML; 1.5 mL lidocaine 1 % Aspirate: 0 mL Outcome: tolerated well, no immediate complications Procedure, treatment alternatives, risks and benefits explained, specific risks discussed. Consent was given by the patient. Immediately prior to procedure a time out was called to verify the correct patient, procedure, equipment, support staff and site/side marked as required. Patient was prepped and draped in the usual sterile fashion.     Allergies: Ibuprofen, Lexapro [escitalopram oxalate], Lisinopril, Mobic [meloxicam], Nabumetone, Prilosec otc [omeprazole magnesium], Zoloft [sertraline hcl], and Sulfa antibiotics   Assessment / Plan:     Visit Diagnoses: Primary osteoarthritis of both hands-she has severe osteoarthritis in bilateral hands.  She continues to have pain and discomfort in the bilateral hands.  Joint protection muscle strengthening was discussed.  A handout on hand exercises was given.  Chronic pain of both shoulders -she has noticed improvement in her shoulder discomfort.  A handout on joint exercises was given at the last visit.  Primary osteoarthritis of left hip-she had better range of motion without much discomfort.  Trochanteric bursitis of both hips-she had tenderness over bilateral trochanteric bursa more prominent on the right side.  She also had right IT band tightness.  She states she has been doing stretching exercises.  Patient had a good response to right trochanteric bursa injection in  January.  Per patient's request right trochanteric bursa was injected with lidocaine and Kenalog.  She tolerated the procedure well.  Postprocedure instructions were given.  Chronic midline low back pain without sciatica-she had continues to have intermittent discomfort in her lower back.  Core strength exercises were emphasized.  Osteopenia of multiple sites - 04/11/2021 T-score: -1.03, BMD: 0.705 left femoral  neck.  Patient will get repeat DEXA scan through her PCP.  Ductal carcinoma in situ (DCIS) of right breast - 09/24, status postlumpectomy and radiation therapy.  Restarted anastrozole November 2024 and discontinued  Ductal carcinoma in situ (DCIS) of left breast - 09/23, status postlumpectomy, and radiation therapy.  Treated with tamoxifen for 1 year.  History of gastroesophageal reflux (GERD)  Diverticulosis  Chronic constipation  Essential hypertension  Dyslipidemia  History of kidney stones  RLS (restless legs syndrome)  History of anxiety  Orders: Orders Placed This Encounter  Procedures   Large Joint Inj   No orders of the defined types were placed in this encounter.    Follow-Up Instructions: Return in about 6 months (around 07/02/2024) for Osteoarthritis.   Pollyann Savoy, MD  Note - This record has been created using Animal nutritionist.  Chart creation errors have been sought, but may not always  have been located. Such creation errors do not reflect on  the standard of medical care.

## 2023-12-31 ENCOUNTER — Ambulatory Visit: Payer: Medicare HMO | Attending: Rheumatology | Admitting: Rheumatology

## 2023-12-31 ENCOUNTER — Encounter: Payer: Self-pay | Admitting: Rheumatology

## 2023-12-31 VITALS — BP 117/70 | HR 74 | Resp 13 | Ht 62.0 in | Wt 149.0 lb

## 2023-12-31 DIAGNOSIS — M545 Low back pain, unspecified: Secondary | ICD-10-CM | POA: Diagnosis not present

## 2023-12-31 DIAGNOSIS — D0511 Intraductal carcinoma in situ of right breast: Secondary | ICD-10-CM

## 2023-12-31 DIAGNOSIS — Z8719 Personal history of other diseases of the digestive system: Secondary | ICD-10-CM | POA: Diagnosis not present

## 2023-12-31 DIAGNOSIS — M8589 Other specified disorders of bone density and structure, multiple sites: Secondary | ICD-10-CM | POA: Diagnosis not present

## 2023-12-31 DIAGNOSIS — M25512 Pain in left shoulder: Secondary | ICD-10-CM

## 2023-12-31 DIAGNOSIS — K5790 Diverticulosis of intestine, part unspecified, without perforation or abscess without bleeding: Secondary | ICD-10-CM

## 2023-12-31 DIAGNOSIS — M19041 Primary osteoarthritis, right hand: Secondary | ICD-10-CM | POA: Diagnosis not present

## 2023-12-31 DIAGNOSIS — M25511 Pain in right shoulder: Secondary | ICD-10-CM | POA: Diagnosis not present

## 2023-12-31 DIAGNOSIS — I10 Essential (primary) hypertension: Secondary | ICD-10-CM | POA: Diagnosis not present

## 2023-12-31 DIAGNOSIS — M1612 Unilateral primary osteoarthritis, left hip: Secondary | ICD-10-CM

## 2023-12-31 DIAGNOSIS — M7062 Trochanteric bursitis, left hip: Secondary | ICD-10-CM

## 2023-12-31 DIAGNOSIS — M7061 Trochanteric bursitis, right hip: Secondary | ICD-10-CM | POA: Diagnosis not present

## 2023-12-31 DIAGNOSIS — D0512 Intraductal carcinoma in situ of left breast: Secondary | ICD-10-CM

## 2023-12-31 DIAGNOSIS — M19042 Primary osteoarthritis, left hand: Secondary | ICD-10-CM

## 2023-12-31 DIAGNOSIS — G8929 Other chronic pain: Secondary | ICD-10-CM

## 2023-12-31 DIAGNOSIS — G2581 Restless legs syndrome: Secondary | ICD-10-CM

## 2023-12-31 DIAGNOSIS — Z8659 Personal history of other mental and behavioral disorders: Secondary | ICD-10-CM

## 2023-12-31 DIAGNOSIS — K5909 Other constipation: Secondary | ICD-10-CM | POA: Diagnosis not present

## 2023-12-31 DIAGNOSIS — Z87442 Personal history of urinary calculi: Secondary | ICD-10-CM

## 2023-12-31 DIAGNOSIS — E785 Hyperlipidemia, unspecified: Secondary | ICD-10-CM

## 2023-12-31 MED ORDER — LIDOCAINE HCL 1 % IJ SOLN
1.5000 mL | INTRAMUSCULAR | Status: AC | PRN
  Administered 2023-12-31: 1.5 mL

## 2023-12-31 MED ORDER — TRIAMCINOLONE ACETONIDE 40 MG/ML IJ SUSP
40.0000 mg | INTRAMUSCULAR | Status: AC | PRN
  Administered 2023-12-31: 40 mg via INTRA_ARTICULAR

## 2023-12-31 NOTE — Patient Instructions (Signed)

## 2024-01-23 DIAGNOSIS — E663 Overweight: Secondary | ICD-10-CM | POA: Diagnosis not present

## 2024-01-23 DIAGNOSIS — Z6828 Body mass index (BMI) 28.0-28.9, adult: Secondary | ICD-10-CM | POA: Diagnosis not present

## 2024-01-23 DIAGNOSIS — R030 Elevated blood-pressure reading, without diagnosis of hypertension: Secondary | ICD-10-CM | POA: Diagnosis not present

## 2024-01-23 DIAGNOSIS — R0789 Other chest pain: Secondary | ICD-10-CM | POA: Diagnosis not present

## 2024-02-02 DIAGNOSIS — R1033 Periumbilical pain: Secondary | ICD-10-CM | POA: Diagnosis not present

## 2024-02-02 DIAGNOSIS — K219 Gastro-esophageal reflux disease without esophagitis: Secondary | ICD-10-CM | POA: Diagnosis not present

## 2024-02-02 DIAGNOSIS — K5730 Diverticulosis of large intestine without perforation or abscess without bleeding: Secondary | ICD-10-CM | POA: Diagnosis not present

## 2024-02-02 DIAGNOSIS — K5904 Chronic idiopathic constipation: Secondary | ICD-10-CM | POA: Diagnosis not present

## 2024-02-11 DIAGNOSIS — M899 Disorder of bone, unspecified: Secondary | ICD-10-CM | POA: Diagnosis not present

## 2024-02-11 DIAGNOSIS — J988 Other specified respiratory disorders: Secondary | ICD-10-CM | POA: Diagnosis not present

## 2024-02-11 DIAGNOSIS — E0590 Thyrotoxicosis, unspecified without thyrotoxic crisis or storm: Secondary | ICD-10-CM | POA: Diagnosis not present

## 2024-02-11 DIAGNOSIS — I951 Orthostatic hypotension: Secondary | ICD-10-CM | POA: Diagnosis not present

## 2024-02-16 ENCOUNTER — Other Ambulatory Visit (HOSPITAL_COMMUNITY): Payer: Self-pay | Admitting: Family Medicine

## 2024-02-16 DIAGNOSIS — R7989 Other specified abnormal findings of blood chemistry: Secondary | ICD-10-CM

## 2024-02-17 DIAGNOSIS — Z8262 Family history of osteoporosis: Secondary | ICD-10-CM | POA: Diagnosis not present

## 2024-02-17 DIAGNOSIS — M8588 Other specified disorders of bone density and structure, other site: Secondary | ICD-10-CM | POA: Diagnosis not present

## 2024-02-17 LAB — HM DEXA SCAN

## 2024-02-22 ENCOUNTER — Other Ambulatory Visit: Payer: Self-pay | Admitting: Cardiovascular Disease

## 2024-02-22 DIAGNOSIS — E785 Hyperlipidemia, unspecified: Secondary | ICD-10-CM

## 2024-02-22 DIAGNOSIS — R079 Chest pain, unspecified: Secondary | ICD-10-CM

## 2024-02-24 ENCOUNTER — Telehealth: Payer: Self-pay

## 2024-02-24 NOTE — Telephone Encounter (Signed)
 Received DEXA results from [facility].  Date of Scan: 02/17/2024  Lowest T-score:-1.6  BMD:0.675  Lowest site measured:Right Femoral Neck  DX: Osteopenia  Significant changes in BMD and site measured (5% and above):-13%R Total Femur -11% L Total Femur   Current Regimen:Calcium , Vitamin D  Recommendation:Osteopenia . Calcuim & Vitamin D resistive exercise. Discuss at F/U appointment   Reviewed by:Dr. Nicholas Bari  Next Appointment: 07/07/2024   Patient notified of results and recommendation.

## 2024-02-25 ENCOUNTER — Encounter: Payer: Self-pay | Admitting: General Practice

## 2024-03-03 DIAGNOSIS — K5904 Chronic idiopathic constipation: Secondary | ICD-10-CM | POA: Diagnosis not present

## 2024-03-03 DIAGNOSIS — R109 Unspecified abdominal pain: Secondary | ICD-10-CM | POA: Diagnosis not present

## 2024-03-03 DIAGNOSIS — K219 Gastro-esophageal reflux disease without esophagitis: Secondary | ICD-10-CM | POA: Diagnosis not present

## 2024-03-03 DIAGNOSIS — K5730 Diverticulosis of large intestine without perforation or abscess without bleeding: Secondary | ICD-10-CM | POA: Diagnosis not present

## 2024-03-03 DIAGNOSIS — R140 Abdominal distension (gaseous): Secondary | ICD-10-CM | POA: Diagnosis not present

## 2024-03-07 ENCOUNTER — Ambulatory Visit (HOSPITAL_COMMUNITY)
Admission: RE | Admit: 2024-03-07 | Discharge: 2024-03-07 | Disposition: A | Discharge status: Home / Self Care | Source: Ambulatory Visit | Attending: Family Medicine | Admitting: Family Medicine

## 2024-03-07 DIAGNOSIS — R7989 Other specified abnormal findings of blood chemistry: Secondary | ICD-10-CM | POA: Diagnosis not present

## 2024-03-08 ENCOUNTER — Ambulatory Visit: Payer: Self-pay | Admitting: Rheumatology

## 2024-03-08 ENCOUNTER — Ambulatory Visit (HOSPITAL_COMMUNITY)
Admission: RE | Admit: 2024-03-08 | Discharge: 2024-03-08 | Disposition: A | Discharge status: Home / Self Care | Source: Ambulatory Visit | Attending: Family Medicine | Admitting: Family Medicine

## 2024-03-08 DIAGNOSIS — R946 Abnormal results of thyroid function studies: Secondary | ICD-10-CM | POA: Diagnosis not present

## 2024-03-08 MED ORDER — SODIUM IODIDE I-123 7.4 MBQ CAPS
450.0000 | ORAL_CAPSULE | Freq: Once | ORAL | Status: AC
  Administered 2024-03-07: 450 via ORAL

## 2024-03-08 NOTE — Progress Notes (Signed)
 DEXA scan is suggestive of osteopenia.  Continue calcium intake 1200 mg p.o. daily including dietary and supplement.  Continue vitamin D and resistive exercises.  Will discuss results at the follow-up visit.

## 2024-03-29 DIAGNOSIS — S20462A Insect bite (nonvenomous) of left back wall of thorax, initial encounter: Secondary | ICD-10-CM | POA: Diagnosis not present

## 2024-03-29 DIAGNOSIS — W57XXXA Bitten or stung by nonvenomous insect and other nonvenomous arthropods, initial encounter: Secondary | ICD-10-CM | POA: Diagnosis not present

## 2024-03-29 DIAGNOSIS — R6883 Chills (without fever): Secondary | ICD-10-CM | POA: Diagnosis not present

## 2024-04-04 DIAGNOSIS — R509 Fever, unspecified: Secondary | ICD-10-CM | POA: Diagnosis not present

## 2024-04-04 DIAGNOSIS — R059 Cough, unspecified: Secondary | ICD-10-CM | POA: Diagnosis not present

## 2024-04-04 DIAGNOSIS — J069 Acute upper respiratory infection, unspecified: Secondary | ICD-10-CM | POA: Diagnosis not present

## 2024-04-04 DIAGNOSIS — R030 Elevated blood-pressure reading, without diagnosis of hypertension: Secondary | ICD-10-CM | POA: Diagnosis not present

## 2024-04-06 ENCOUNTER — Emergency Department (HOSPITAL_COMMUNITY)

## 2024-04-06 ENCOUNTER — Inpatient Hospital Stay (HOSPITAL_COMMUNITY)
Admission: EM | Admit: 2024-04-06 | Discharge: 2024-04-08 | DRG: 871 | Disposition: A | Discharge status: Home / Self Care | Attending: Internal Medicine | Admitting: Internal Medicine

## 2024-04-06 ENCOUNTER — Other Ambulatory Visit: Payer: Self-pay

## 2024-04-06 ENCOUNTER — Encounter (HOSPITAL_COMMUNITY): Payer: Self-pay

## 2024-04-06 DIAGNOSIS — G2581 Restless legs syndrome: Secondary | ICD-10-CM | POA: Diagnosis not present

## 2024-04-06 DIAGNOSIS — R0602 Shortness of breath: Secondary | ICD-10-CM | POA: Diagnosis not present

## 2024-04-06 DIAGNOSIS — Z90710 Acquired absence of both cervix and uterus: Secondary | ICD-10-CM

## 2024-04-06 DIAGNOSIS — Z87891 Personal history of nicotine dependence: Secondary | ICD-10-CM | POA: Diagnosis not present

## 2024-04-06 DIAGNOSIS — Z79811 Long term (current) use of aromatase inhibitors: Secondary | ICD-10-CM

## 2024-04-06 DIAGNOSIS — K219 Gastro-esophageal reflux disease without esophagitis: Secondary | ICD-10-CM | POA: Diagnosis present

## 2024-04-06 DIAGNOSIS — J189 Pneumonia, unspecified organism: Principal | ICD-10-CM | POA: Diagnosis present

## 2024-04-06 DIAGNOSIS — R0789 Other chest pain: Secondary | ICD-10-CM | POA: Diagnosis not present

## 2024-04-06 DIAGNOSIS — I10 Essential (primary) hypertension: Secondary | ICD-10-CM | POA: Diagnosis present

## 2024-04-06 DIAGNOSIS — A419 Sepsis, unspecified organism: Principal | ICD-10-CM | POA: Diagnosis present

## 2024-04-06 DIAGNOSIS — Z923 Personal history of irradiation: Secondary | ICD-10-CM | POA: Diagnosis not present

## 2024-04-06 DIAGNOSIS — J9601 Acute respiratory failure with hypoxia: Secondary | ICD-10-CM | POA: Diagnosis present

## 2024-04-06 DIAGNOSIS — E876 Hypokalemia: Secondary | ICD-10-CM | POA: Diagnosis not present

## 2024-04-06 DIAGNOSIS — E785 Hyperlipidemia, unspecified: Secondary | ICD-10-CM | POA: Diagnosis present

## 2024-04-06 DIAGNOSIS — F419 Anxiety disorder, unspecified: Secondary | ICD-10-CM | POA: Diagnosis not present

## 2024-04-06 DIAGNOSIS — R531 Weakness: Secondary | ICD-10-CM | POA: Diagnosis not present

## 2024-04-06 DIAGNOSIS — E7800 Pure hypercholesterolemia, unspecified: Secondary | ICD-10-CM | POA: Diagnosis present

## 2024-04-06 DIAGNOSIS — Z853 Personal history of malignant neoplasm of breast: Secondary | ICD-10-CM

## 2024-04-06 DIAGNOSIS — Z8249 Family history of ischemic heart disease and other diseases of the circulatory system: Secondary | ICD-10-CM | POA: Diagnosis not present

## 2024-04-06 DIAGNOSIS — Z823 Family history of stroke: Secondary | ICD-10-CM | POA: Diagnosis not present

## 2024-04-06 DIAGNOSIS — I700 Atherosclerosis of aorta: Secondary | ICD-10-CM | POA: Diagnosis not present

## 2024-04-06 DIAGNOSIS — Z79899 Other long term (current) drug therapy: Secondary | ICD-10-CM | POA: Diagnosis not present

## 2024-04-06 DIAGNOSIS — J168 Pneumonia due to other specified infectious organisms: Secondary | ICD-10-CM | POA: Diagnosis not present

## 2024-04-06 LAB — D-DIMER, QUANTITATIVE: D-Dimer, Quant: 0.53 ug{FEU}/mL — ABNORMAL HIGH (ref 0.00–0.50)

## 2024-04-06 LAB — BASIC METABOLIC PANEL WITH GFR
Anion gap: 14 (ref 5–15)
BUN: 17 mg/dL (ref 8–23)
CO2: 22 mmol/L (ref 22–32)
Calcium: 9.4 mg/dL (ref 8.9–10.3)
Chloride: 100 mmol/L (ref 98–111)
Creatinine, Ser: 0.86 mg/dL (ref 0.44–1.00)
GFR, Estimated: >60 mL/min (ref >60)
Glucose, Bld: 97 mg/dL (ref 70–99)
Potassium: 3.2 mmol/L — ABNORMAL LOW (ref 3.5–5.1)
Sodium: 136 mmol/L (ref 135–145)

## 2024-04-06 LAB — CBC
HCT: 38.7 % (ref 36.0–46.0)
Hemoglobin: 13 g/dL (ref 12.0–15.0)
MCH: 29.3 pg (ref 26.0–34.0)
MCHC: 33.6 g/dL (ref 30.0–36.0)
MCV: 87.2 fL (ref 80.0–100.0)
Platelets: 247 10*3/uL (ref 150–400)
RBC: 4.44 MIL/uL (ref 3.87–5.11)
RDW: 13 % (ref 11.5–15.5)
WBC: 6.6 10*3/uL (ref 4.0–10.5)
nRBC: 0 % (ref 0.0–0.2)

## 2024-04-06 LAB — RESP PANEL BY RT-PCR (RSV, FLU A&B, COVID)  RVPGX2
Influenza A by PCR: NEGATIVE
Influenza B by PCR: NEGATIVE
Resp Syncytial Virus by PCR: NEGATIVE
SARS Coronavirus 2 by RT PCR: NEGATIVE

## 2024-04-06 LAB — LACTIC ACID, PLASMA: Lactic Acid, Venous: 1.2 mmol/L (ref 0.5–1.9)

## 2024-04-06 MED ORDER — POTASSIUM CHLORIDE CRYS ER 20 MEQ PO TBCR
40.0000 meq | EXTENDED_RELEASE_TABLET | Freq: Once | ORAL | Status: AC
Start: 2024-04-06 — End: 2024-04-06
  Administered 2024-04-06: 40 meq via ORAL
  Filled 2024-04-06: qty 2

## 2024-04-06 MED ORDER — IPRATROPIUM-ALBUTEROL 0.5-2.5 (3) MG/3ML IN SOLN
3.0000 mL | Freq: Once | RESPIRATORY_TRACT | Status: AC
  Administered 2024-04-06: 3 mL via RESPIRATORY_TRACT
  Filled 2024-04-06: qty 3

## 2024-04-06 MED ORDER — SODIUM CHLORIDE 0.9 % IV BOLUS
500.0000 mL | Freq: Once | INTRAVENOUS | Status: AC
  Administered 2024-04-06: 500 mL via INTRAVENOUS

## 2024-04-06 MED ORDER — AZITHROMYCIN 250 MG PO TABS
500.0000 mg | ORAL_TABLET | Freq: Once | ORAL | Status: AC
  Administered 2024-04-07: 500 mg via ORAL
  Filled 2024-04-06: qty 2

## 2024-04-06 MED ORDER — SODIUM CHLORIDE 0.9 % IV SOLN
1.0000 g | Freq: Once | INTRAVENOUS | Status: AC
  Administered 2024-04-06: 1 g via INTRAVENOUS
  Filled 2024-04-06: qty 10

## 2024-04-06 MED ORDER — BENZONATATE 100 MG PO CAPS
200.0000 mg | ORAL_CAPSULE | Freq: Once | ORAL | Status: AC
  Administered 2024-04-06: 200 mg via ORAL
  Filled 2024-04-06: qty 2

## 2024-04-06 MED ORDER — ONDANSETRON HCL 4 MG/2ML IJ SOLN
4.0000 mg | Freq: Once | INTRAMUSCULAR | Status: AC
  Administered 2024-04-06: 4 mg via INTRAVENOUS
  Filled 2024-04-06: qty 2

## 2024-04-06 NOTE — ED Triage Notes (Signed)
 Pt arrived via POV c/o pneumonia and feeling weak. Pt reports she saw her PCP on Monday and was told she may have walking pneumonia. Pt reports being started on an antibiotic w/o relief.

## 2024-04-06 NOTE — ED Provider Notes (Signed)
 [name] Provider Note   CSN: [id number] Arrival date & time: 04/06/24  1732     Patient presents with: Pneumonia   Alicia [name] is a 83 y.o. female.  {Add pertinent medical, surgical, social history, OB history to HPI:32947}  Pneumonia Associated symptoms include chest pain and shortness of breath. Pertinent negatives include no abdominal pain and no headaches.       Alicia [name] is a 83 y.o. female past medical history of hypertension, GERD, anxiety, anginal pain who presents to the Emergency Department complaining of cough, generalized weakness and decreased appetite.  Symptoms began Saturday.  Cough has been mostly nonproductive.  She describes tightness throughout her chest and through her upper back.  Worsens when she coughs.  Some exertional dyspnea as well.  Was seen in urgent care 2 days ago given cough medication and Zithromax.  States that every time she attempts to take the prescribed medications that she vomits.  She also endorses decreased appetite and loose stools.  No melena or hematochezia.  Low-grade fever with Tmax of 100 at home.  Prior to Admission medications   Medication Sig Start Date End Date Taking? Authorizing Provider  acetaminophen  (TYLENOL ) 650 MG CR tablet Take 1,300 mg by mouth every 8 (eight) hours as needed for pain.    [provider]  amLODipine (NORVASC) 2.5 MG tablet Take 2.5 mg by mouth daily. 11/24/23   [provider]  azithromycin (ZITHROMAX) 250 MG tablet Take 250 mg by mouth as directed. 04/05/24   [provider]  Bioflavonoid Products (BIOFLEX PO) Take 1 tablet by mouth daily.    [provider]  Biotin 5000 MCG TABS Take by mouth daily.    [provider]  Calcium Magnesium Zinc 333-133-5 MG TABS Take by mouth daily. Patient not taking: Reported on 12/31/2023    [provider]  COMIRNATY syringe Inject 0.3 mLs into the muscle once. Patient  not taking: Reported on 12/31/2023 06/12/23   [provider]  cyanocobalamin (VITAMIN B12) 1000 MCG tablet Take 1,000 mcg by mouth daily.    [provider]  doxycycline (MONODOX) 100 MG capsule Take 100 mg by mouth 2 (two) times daily.    [provider]  famotidine-calcium carbonate-magnesium hydroxide (PEPCID COMPLETE) 10-800-165 MG chewable tablet Chew 1 tablet by mouth as needed.    [provider]  Flax Oil-Fish Oil-Borage Oil (FISH-FLAX-BORAGE PO) Take 1 capsule by mouth daily.    [provider]  fluticasone  (FLONASE ) 50 MCG/ACT nasal spray Place 1 spray into both nostrils daily. Patient taking differently: Place 1 spray into both nostrils daily as needed for allergies. 07/11/21   Blaise Aleene KIDD, MD  irbesartan (AVAPRO) 300 MG tablet Take 300 mg by mouth daily. 10/11/19   [provider]  isosorbide  mononitrate (IMDUR ) 30 MG 24 hr tablet Take 1/2 (one-half) tablet by mouth once daily 02/22/24   Court Dorn PARAS, MD  letrozole  (FEMARA ) 2.5 MG tablet Take 1 tablet (2.5 mg total) by mouth daily. 11/24/23   Causey, Morna Pickle, NP  LORazepam (ATIVAN) 0.5 MG tablet Take 0.5 mg by mouth 2 (two) times daily as needed for anxiety. 02/11/16   [provider]  meclizine (ANTIVERT) 25 MG tablet Take 25 mg by mouth 3 (three) times daily as needed for dizziness. 04/27/20   [provider]  metoprolol  succinate (TOPROL  XL) 25 MG 24 hr tablet Take 0.5 tablets (12.5 mg total) by mouth daily. 09/16/23  Cleaver, Josefa HERO, NP  Misc Natural Products (OSTEO BI-FLEX TRIPLE STRENGTH) TABS Take by mouth daily.    [provider]  Multiple Minerals-Vitamins (CAL MAG ZINC +D3 PO) Take 1 tablet by mouth daily.    [provider]  multivitamin-lutein (OCUVITE-LUTEIN) CAPS Take 830 capsules by mouth daily.    [provider]  omeprazole (PRILOSEC) 40 MG capsule Take 40 mg by mouth daily. 02/06/16   [provider]   rOPINIRole  (REQUIP ) 1 MG tablet Take 1 tablet (1 mg total) by mouth 3 (three) times daily. Patient taking differently: Take 1-2 mg by mouth See admin instructions. Take 1 mg by mouth in the morning & take 2 mg at night. 10/20/18   Patel, Donika K, DO  simvastatin (ZOCOR) 20 MG tablet Take 20 mg by mouth every evening. 02/06/16   [provider]  traMADol  (ULTRAM ) 50 MG tablet Take 1 tablet (50 mg total) by mouth every 6 (six) hours as needed for moderate pain (pain score 4-6) or severe pain (pain score 7-10). Patient not taking: Reported on 12/31/2023 10/13/23   Shannon Agent, MD    Allergies: Ibuprofen, Mobic [meloxicam], Nabumetone, Lexapro [escitalopram oxalate], Lisinopril, Prilosec otc [omeprazole magnesium], Sulfa antibiotics, and Zoloft [sertraline hcl]    Review of Systems  Constitutional:  Positive for appetite change, chills and fever.  HENT:  Negative for congestion and sore throat.   Respiratory:  Positive for cough and shortness of breath.   Cardiovascular:  Positive for chest pain. Negative for leg swelling.  Gastrointestinal:  Positive for diarrhea, nausea and vomiting. Negative for abdominal pain.  Genitourinary:  Negative for dysuria.  Skin:  Negative for rash.  Neurological:  Positive for weakness. Negative for dizziness and headaches.    Updated Vital Signs BP (!) 154/79 (BP Location: Right Arm)   Pulse (!) 121   Temp (!) 97.2 F (36.2 C)   Resp 20   Ht 5' 2 (1.575 m)   Wt 67.6 kg   SpO2 97%   BMI 27.26 kg/m   Physical Exam Vitals and nursing note reviewed.  Constitutional:      General: She is not in acute distress.    Appearance: Normal appearance. She is not toxic-appearing.  HENT:     Mouth/Throat:     Mouth: Mucous membranes are moist.     Pharynx: No oropharyngeal exudate.  Cardiovascular:     Rate and Rhythm: Normal rate and regular rhythm.     Pulses: Normal pulses.  Pulmonary:     Effort: Pulmonary effort is normal.     Breath sounds:  Rhonchi present.     Comments: Rhonchorous lung sounds on the left.  No wheezing or rales.  No increased work of breathing on exam. Abdominal:     Palpations: Abdomen is soft.     Tenderness: There is no abdominal tenderness.  Musculoskeletal:        General: Normal range of motion.     Cervical back: Normal range of motion.     Right lower leg: No edema.     Left lower leg: No edema.  Lymphadenopathy:     Cervical: No cervical adenopathy.  Skin:    General: Skin is warm.     Capillary Refill: Capillary refill takes less than 2 seconds.  Neurological:     General: No focal deficit present.     Mental Status: She is alert.     Sensory: No sensory deficit.     Motor: No weakness.     (  all labs ordered are listed, but only abnormal results are displayed) Labs Reviewed  CULTURE, BLOOD (ROUTINE X 2)  CULTURE, BLOOD (ROUTINE X 2)  BASIC METABOLIC PANEL WITH GFR  CBC  LACTIC ACID, PLASMA  LACTIC ACID, PLASMA    EKG: EKG Interpretation Date/Time:  Wednesday April 06 2024 18:56:05 EDT Ventricular Rate:  98 PR Interval:  121 QRS Duration:  132 QT Interval:  373 QTC Calculation: 477 R Axis:   28  Text Interpretation: Sinus rhythm Right bundle branch block Confirmed by Francesca Fallow ([id number]) on 04/06/2024 9:58:28 PM  Radiology: [facility] Chest Port 1 View Result Date: 04/06/2024 CLINICAL DATA:  Shortness of breath and weakness EXAM: PORTABLE CHEST 1 VIEW COMPARISON:  06/11/2023 FINDINGS: Stable cardiomediastinal silhouette. Aortic atherosclerotic calcification. Diffuse interstitial coarsening. No focal consolidation, pleural effusion, or pneumothorax. IMPRESSION: Diffuse interstitial coarsening which may be due to edema or atypical infection. Electronically Signed   By: Norman Gatlin M.D.   On: 04/06/2024 19:10    {Document cardiac monitor, telemetry assessment procedure when appropriate:32947} Procedures   Medications Ordered in the ED - No data to display    {Click here for  ABCD2, HEART and other calculators REFRESH Note before signing:1}                              Medical Decision Making Patient here from home for evaluation of cough, chest tightness, posttussive emesis and shortness of breath endorses fever at home max 100.  Chest tightness with coughing.  Cough has been mostly nonproductive.  Seen earlier in the week at urgent care was given prescription for cough syrup and azithromycin with no improvement of symptoms  Pneumonia, viral process, bronchitis, PE, ACS all considered  Amount and/or Complexity of Data Reviewed Labs: ordered.    Details: Labs no evidence of leukocytosis, chemistries mild hypokalemia, lactic acid unremarkable.    D-dimer slightly elevated but age-adjusted shows VTE unlikely Radiology: ordered.    Details: Chest x-ray shows diffuse interstitial coarsening may be related to edema versus atypical infection ECG/medicine tests: ordered.    Details: EKG shows sinus rhythm with right bundle branch block Discussion of management or test interpretation with external provider(s): Patient has received DuoNeb here, potassium, Zofran  and Tessalon pearls for her cough as well as Zithromax and Rocephin   Lung sounds have somewhat improved although still occasional rhonchi heard at left base.  Attempted to ambulate patient in the department became hypoxic after ambulation O2 sat remained at 88 to 89% was placed on 2 L now satting at 95%  Risk Prescription drug management.     {Document critical care time when appropriate  Document review of labs and clinical decision tools ie CHADS2VASC2, etc  Document your independent review of radiology images and any outside records  Document your discussion with family members, caretakers and with consultants  Document social determinants of health affecting pt's care  Document your decision making why or why not admission, treatments were needed:32947:::1}   Final diagnoses:  None    ED  Discharge Orders     None

## 2024-04-06 NOTE — ED Notes (Addendum)
 Pt's oxygen is 90% at this time. [name] 2L applied

## 2024-04-06 NOTE — ED Notes (Signed)
 O2 sat while ambulating was 93%

## 2024-04-06 NOTE — Progress Notes (Signed)
 Nebulizer treatment given.  Spoke with patient and daughter about what led up to this visit.  Daughter stated that her mother had been cleaning some awnings outside her home that had black mold on them days before symptoms started.  BS were mainly clear but diminished in LLL.

## 2024-04-06 NOTE — ED Notes (Signed)
 Pt denies nausea at this time. Non productive congested cough still present.

## 2024-04-07 ENCOUNTER — Ambulatory Visit: Admitting: General Practice

## 2024-04-07 DIAGNOSIS — J189 Pneumonia, unspecified organism: Secondary | ICD-10-CM

## 2024-04-07 DIAGNOSIS — A419 Sepsis, unspecified organism: Secondary | ICD-10-CM | POA: Diagnosis not present

## 2024-04-07 DIAGNOSIS — K219 Gastro-esophageal reflux disease without esophagitis: Secondary | ICD-10-CM | POA: Insufficient documentation

## 2024-04-07 DIAGNOSIS — G2581 Restless legs syndrome: Secondary | ICD-10-CM | POA: Insufficient documentation

## 2024-04-07 LAB — CBC
HCT: 34.1 % — ABNORMAL LOW (ref 36.0–46.0)
Hemoglobin: 11.3 g/dL — ABNORMAL LOW (ref 12.0–15.0)
MCH: 29.4 pg (ref 26.0–34.0)
MCHC: 33.1 g/dL (ref 30.0–36.0)
MCV: 88.6 fL (ref 80.0–100.0)
Platelets: 211 10*3/uL (ref 150–400)
RBC: 3.85 MIL/uL — ABNORMAL LOW (ref 3.87–5.11)
RDW: 13.2 % (ref 11.5–15.5)
WBC: 4.9 10*3/uL (ref 4.0–10.5)
nRBC: 0 % (ref 0.0–0.2)

## 2024-04-07 LAB — BASIC METABOLIC PANEL WITH GFR
Anion gap: 6 (ref 5–15)
BUN: 17 mg/dL (ref 8–23)
CO2: 25 mmol/L (ref 22–32)
Calcium: 9 mg/dL (ref 8.9–10.3)
Chloride: 104 mmol/L (ref 98–111)
Creatinine, Ser: 0.92 mg/dL (ref 0.44–1.00)
GFR, Estimated: >60 mL/min (ref >60)
Glucose, Bld: 105 mg/dL — ABNORMAL HIGH (ref 70–99)
Potassium: 3.4 mmol/L — ABNORMAL LOW (ref 3.5–5.1)
Sodium: 135 mmol/L (ref 135–145)

## 2024-04-07 LAB — CORTISOL-AM, BLOOD: Cortisol - AM: 5.6 ug/dL — ABNORMAL LOW (ref 6.7–22.6)

## 2024-04-07 LAB — PROTIME-INR
INR: 1.1 (ref 0.8–1.2)
Prothrombin Time: 14.5 s (ref 11.4–15.2)

## 2024-04-07 MED ORDER — PANTOPRAZOLE SODIUM 40 MG PO TBEC
40.0000 mg | DELAYED_RELEASE_TABLET | Freq: Every day | ORAL | Status: DC
  Administered 2024-04-07 – 2024-04-08 (×2): 40 mg via ORAL
  Filled 2024-04-07 (×2): qty 1

## 2024-04-07 MED ORDER — BIOTIN 5000 MCG PO TABS: 1.0000 | ORAL_TABLET | Freq: Every day | ORAL | Status: DC

## 2024-04-07 MED ORDER — ROPINIROLE HCL 1 MG PO TABS: 1.0000 mg | ORAL_TABLET | ORAL | Status: DC

## 2024-04-07 MED ORDER — ROPINIROLE HCL 1 MG PO TABS
2.0000 mg | ORAL_TABLET | Freq: Every day | ORAL | Status: DC
  Administered 2024-04-07: 2 mg via ORAL
  Filled 2024-04-07: qty 2

## 2024-04-07 MED ORDER — MAGNESIUM OXIDE -MG SUPPLEMENT 400 (240 MG) MG PO TABS
200.0000 mg | ORAL_TABLET | Freq: Every day | ORAL | Status: DC
  Administered 2024-04-08: 200 mg via ORAL
  Filled 2024-04-07 (×2): qty 1

## 2024-04-07 MED ORDER — OCUVITE-LUTEIN PO CAPS
1.0000 | ORAL_CAPSULE | Freq: Every day | ORAL | Status: DC
  Administered 2024-04-08: 1 via ORAL
  Filled 2024-04-07 (×2): qty 1

## 2024-04-07 MED ORDER — GUAIFENESIN-DM 100-10 MG/5ML PO SYRP
5.0000 mL | ORAL_SOLUTION | ORAL | Status: DC | PRN
  Filled 2024-04-07: qty 5

## 2024-04-07 MED ORDER — LETROZOLE 2.5 MG PO TABS
2.5000 mg | ORAL_TABLET | Freq: Every day | ORAL | Status: DC
  Administered 2024-04-07: 2.5 mg via ORAL
  Filled 2024-04-07 (×3): qty 1

## 2024-04-07 MED ORDER — ROPINIROLE HCL 1 MG PO TABS
1.0000 mg | ORAL_TABLET | Freq: Every day | ORAL | Status: DC
  Administered 2024-04-07 – 2024-04-08 (×2): 1 mg via ORAL
  Filled 2024-04-07 (×2): qty 1

## 2024-04-07 MED ORDER — BENZONATATE 100 MG PO CAPS
200.0000 mg | ORAL_CAPSULE | Freq: Three times a day (TID) | ORAL | Status: DC
  Administered 2024-04-07 – 2024-04-08 (×3): 200 mg via ORAL
  Filled 2024-04-07 (×3): qty 2

## 2024-04-07 MED ORDER — ONDANSETRON HCL 4 MG PO TABS: 4.0000 mg | ORAL_TABLET | Freq: Four times a day (QID) | ORAL | Status: DC | PRN

## 2024-04-07 MED ORDER — ENOXAPARIN SODIUM 40 MG/0.4ML IJ SOSY
40.0000 mg | PREFILLED_SYRINGE | INTRAMUSCULAR | Status: DC
  Administered 2024-04-07 – 2024-04-08 (×2): 40 mg via SUBCUTANEOUS
  Filled 2024-04-07 (×2): qty 0.4

## 2024-04-07 MED ORDER — VITAMIN B-12 1000 MCG PO TABS
1000.0000 ug | ORAL_TABLET | Freq: Every day | ORAL | Status: DC
Start: 2024-04-07 — End: 2024-04-08
  Administered 2024-04-08: 1000 ug via ORAL
  Filled 2024-04-07 (×2): qty 1

## 2024-04-07 MED ORDER — LACTATED RINGERS IV SOLN
150.0000 mL/h | INTRAVENOUS | Status: DC
Start: 2024-04-07 — End: 2024-04-07
  Administered 2024-04-07: 150 mL/h via INTRAVENOUS

## 2024-04-07 MED ORDER — TRAZODONE HCL 50 MG PO TABS
25.0000 mg | ORAL_TABLET | Freq: Every evening | ORAL | Status: DC | PRN
  Administered 2024-04-07 (×2): 25 mg via ORAL
  Filled 2024-04-07 (×2): qty 1

## 2024-04-07 MED ORDER — ONDANSETRON HCL 4 MG/2ML IJ SOLN
4.0000 mg | Freq: Four times a day (QID) | INTRAMUSCULAR | Status: DC | PRN
  Administered 2024-04-07: 4 mg via INTRAVENOUS
  Filled 2024-04-07: qty 2

## 2024-04-07 MED ORDER — CALCIUM MAGNESIUM ZINC 333-133-5 MG PO TABS: 1.0000 | ORAL_TABLET | Freq: Every day | ORAL | Status: DC

## 2024-04-07 MED ORDER — ZINC SULFATE 220 (50 ZN) MG PO CAPS
220.0000 mg | ORAL_CAPSULE | Freq: Every day | ORAL | Status: DC
  Administered 2024-04-08: 220 mg via ORAL
  Filled 2024-04-07 (×2): qty 1

## 2024-04-07 MED ORDER — METOPROLOL SUCCINATE ER 25 MG PO TB24
12.5000 mg | ORAL_TABLET | Freq: Every day | ORAL | Status: DC
  Administered 2024-04-07 – 2024-04-08 (×2): 12.5 mg via ORAL
  Filled 2024-04-07 (×2): qty 1

## 2024-04-07 MED ORDER — FISH-FLAX-BORAGE PO CAPS: ORAL_CAPSULE | Freq: Every day | ORAL | Status: DC

## 2024-04-07 MED ORDER — OSTEO BI-FLEX TRIPLE STRENGTH PO TABS: 1.0000 | ORAL_TABLET | Freq: Every day | ORAL | Status: DC

## 2024-04-07 MED ORDER — CALCIUM CARBONATE 1250 (500 CA) MG PO TABS
1.0000 | ORAL_TABLET | Freq: Every day | ORAL | Status: DC
  Administered 2024-04-08: 1250 mg via ORAL
  Filled 2024-04-07 (×2): qty 1

## 2024-04-07 MED ORDER — SIMVASTATIN 10 MG PO TABS
20.0000 mg | ORAL_TABLET | Freq: Every evening | ORAL | Status: DC
  Administered 2024-04-07: 20 mg via ORAL
  Filled 2024-04-07: qty 2

## 2024-04-07 MED ORDER — FLUTICASONE PROPIONATE 50 MCG/ACT NA SUSP: 1.0000 | Freq: Every day | NASAL | Status: DC | PRN

## 2024-04-07 MED ORDER — ISOSORBIDE MONONITRATE ER 30 MG PO TB24
15.0000 mg | ORAL_TABLET | Freq: Every day | ORAL | Status: DC
  Administered 2024-04-07 – 2024-04-08 (×2): 15 mg via ORAL
  Filled 2024-04-07 (×2): qty 1

## 2024-04-07 MED ORDER — ACETAMINOPHEN 650 MG RE SUPP: 650.0000 mg | Freq: Four times a day (QID) | RECTAL | Status: DC | PRN

## 2024-04-07 MED ORDER — SODIUM CHLORIDE 0.9 % IV SOLN
500.0000 mg | INTRAVENOUS | Status: DC
  Administered 2024-04-07: 500 mg via INTRAVENOUS
  Filled 2024-04-07: qty 5

## 2024-04-07 MED ORDER — OMEGA-3-ACID ETHYL ESTERS 1 G PO CAPS
1.0000 g | ORAL_CAPSULE | Freq: Every day | ORAL | Status: DC
  Administered 2024-04-08: 1 g via ORAL
  Filled 2024-04-07 (×2): qty 1

## 2024-04-07 MED ORDER — HYDROCOD POLI-CHLORPHE POLI ER 10-8 MG/5ML PO SUER
5.0000 mL | Freq: Two times a day (BID) | ORAL | Status: DC | PRN
  Administered 2024-04-07 – 2024-04-08 (×3): 5 mL via ORAL
  Filled 2024-04-07 (×3): qty 5

## 2024-04-07 MED ORDER — GUAIFENESIN ER 600 MG PO TB12
600.0000 mg | ORAL_TABLET | Freq: Two times a day (BID) | ORAL | Status: DC
  Administered 2024-04-07 – 2024-04-08 (×4): 600 mg via ORAL
  Filled 2024-04-07 (×4): qty 1

## 2024-04-07 MED ORDER — IPRATROPIUM-ALBUTEROL 0.5-2.5 (3) MG/3ML IN SOLN
3.0000 mL | Freq: Three times a day (TID) | RESPIRATORY_TRACT | Status: DC
  Administered 2024-04-07 – 2024-04-08 (×3): 3 mL via RESPIRATORY_TRACT
  Filled 2024-04-07 (×2): qty 3

## 2024-04-07 MED ORDER — ACETAMINOPHEN 325 MG PO TABS
650.0000 mg | ORAL_TABLET | Freq: Four times a day (QID) | ORAL | Status: DC | PRN
  Administered 2024-04-07: 650 mg via ORAL
  Filled 2024-04-07: qty 2

## 2024-04-07 MED ORDER — IRBESARTAN 150 MG PO TABS
300.0000 mg | ORAL_TABLET | Freq: Every day | ORAL | Status: DC
  Administered 2024-04-07 – 2024-04-08 (×2): 300 mg via ORAL
  Filled 2024-04-07 (×2): qty 2

## 2024-04-07 MED ORDER — ROPINIROLE HCL 1 MG PO TABS
2.0000 mg | ORAL_TABLET | Freq: Once | ORAL | Status: AC
  Administered 2024-04-07: 2 mg via ORAL
  Filled 2024-04-07: qty 2

## 2024-04-07 MED ORDER — IPRATROPIUM-ALBUTEROL 0.5-2.5 (3) MG/3ML IN SOLN
3.0000 mL | Freq: Four times a day (QID) | RESPIRATORY_TRACT | Status: DC
  Administered 2024-04-07: 3 mL via RESPIRATORY_TRACT
  Filled 2024-04-07: qty 3

## 2024-04-07 MED ORDER — MAGNESIUM HYDROXIDE 400 MG/5ML PO SUSP: 30.0000 mL | Freq: Every day | ORAL | Status: DC | PRN

## 2024-04-07 MED ORDER — CALCIUM CARBONATE ANTACID 500 MG PO CHEW: 1.0000 | CHEWABLE_TABLET | Freq: Every day | ORAL | Status: DC | PRN

## 2024-04-07 MED ORDER — CALCIUM CARBONATE ANTACID 500 MG PO CHEW
1.0000 | CHEWABLE_TABLET | Freq: Three times a day (TID) | ORAL | Status: DC | PRN
  Administered 2024-04-07: 200 mg via ORAL
  Filled 2024-04-07: qty 1

## 2024-04-07 MED ORDER — SODIUM CHLORIDE 0.9 % IV SOLN
2.0000 g | INTRAVENOUS | Status: DC
  Administered 2024-04-07: 2 g via INTRAVENOUS
  Filled 2024-04-07 (×2): qty 20

## 2024-04-07 MED ORDER — LORAZEPAM 0.5 MG PO TABS: 0.5000 mg | ORAL_TABLET | Freq: Two times a day (BID) | ORAL | Status: DC | PRN

## 2024-04-07 MED ORDER — POTASSIUM CHLORIDE CRYS ER 20 MEQ PO TBCR
40.0000 meq | EXTENDED_RELEASE_TABLET | Freq: Two times a day (BID) | ORAL | Status: AC
  Administered 2024-04-07 (×2): 40 meq via ORAL
  Filled 2024-04-07 (×2): qty 2

## 2024-04-07 NOTE — Assessment & Plan Note (Signed)
 Will continue statin therapy

## 2024-04-07 NOTE — Assessment & Plan Note (Addendum)
-   The patient will be admitted to a medical telemetry bed. - The patient has subsequent acute respiratory failure with hypoxia - Will continue antibiotic therapy with IV Rocephin and Zithromax. - Mucolytic therapy be provided as well as duo nebs q.i.d. and q.4 hours p.r.n. - We will follow blood cultures. -The patient will be hydrated with IV lactated ringer . - Sepsis manifested by tachycardia and tachypnea

## 2024-04-07 NOTE — Assessment & Plan Note (Signed)
 Continue Requip.

## 2024-04-07 NOTE — Plan of Care (Signed)
  Problem: Clinical Measurements: Goal: Diagnostic test results will improve Outcome: Progressing   Problem: Respiratory: Goal: Ability to maintain adequate ventilation will improve Outcome: Progressing   

## 2024-04-07 NOTE — Progress Notes (Signed)
   04/07/24 1027  TOC Brief Assessment  Patient has primary care physician Yes  Home environment has been reviewed Home  Social Drivers of Health Review SDOH reviewed no interventions necessary  Readmission risk has been reviewed Yes  Transition of care needs no transition of care needs at this time   Transition of Care Department [facility] has reviewed patient and no TOC needs have been identified at this time. We will continue to monitor patient advancement through interdisciplinary progression rounds. If new patient transition needs arise, please place a TOC consult.

## 2024-04-07 NOTE — ED Notes (Signed)
 Pt provided sandwich and drink. Requested her nightly med of Requip . EDP notifed.

## 2024-04-07 NOTE — Plan of Care (Signed)
   Problem: Health Behavior/Discharge Planning: Goal: Ability to manage health-related needs will improve Outcome: Progressing

## 2024-04-07 NOTE — Assessment & Plan Note (Signed)
 Will continue PPI therapy.

## 2024-04-07 NOTE — Progress Notes (Signed)
 Mobility Specialist Progress Note:    04/07/24 1025  Mobility  Activity Ambulated with assistance in room;Ambulated with assistance to bathroom;Transferred from bed to chair  Level of Assistance Modified independent, requires aide device or extra time  Assistive Device None  Distance Ambulated (ft) 40 ft  Range of Motion/Exercises Active;All extremities  Activity Response Tolerated well  Mobility Referral Yes  Mobility visit 1 Mobility  Mobility Specialist Start Time (ACUTE ONLY) 1010  Mobility Specialist Stop Time (ACUTE ONLY) 1025  Mobility Specialist Time Calculation (min) (ACUTE ONLY) 15 min   Pt received in bed, agreeable to mobility. ModI to stand and ambulate with no AD. Tolerated well, asx throughout. Left pt in chair, all needs met.   Sherrilee Ditty Mobility Specialist Please contact via Special educational needs teacher or  Rehab office at [phone]

## 2024-04-07 NOTE — Assessment & Plan Note (Signed)
Will continue antihypertensive therapy.

## 2024-04-07 NOTE — H&P (Addendum)
 [place]   PATIENT NAME: Alicia [name]    MR#:  [id number]  DATE OF BIRTH:  1941/08/16  DATE OF ADMISSION:  04/06/2024  PRIMARY CARE PHYSICIAN: Auston Opal, DO   Patient is coming from: Home  REQUESTING/REFERRING PHYSICIAN: Herlinda Milling, PA-C  CHIEF COMPLAINT:   Chief Complaint  Patient presents with   Pneumonia    HISTORY OF PRESENT ILLNESS:  Alicia [name] is a 83 y.o. Caucasian female with medical history significant for hypertension dyslipidemia, GERD, osteoarthritis, restless leg syndrome and anxiety, who directed emergency room with acute onset of worsening dyspnea with associated cough with difficulty with expectoration as well as chest tightness with cough and posttussive vomiting, since Saturday.  The patient was seen in urgent care and was given p.o. Zithromax without significant improvement.  She admits to fever and chills.  She had an episode of vomiting without bilious vomitus or hematemesis.  No dysuria, oliguria or hematuria or flank pain.  No other bleeding diathesis.  ED Course: When she came to the ER, pulse oximetry was 89% on room air and 93%-97% on 2L O2 nasal cannula, heart rate was 121 temperature 97.2 respiratory rate of 20 and later 23, BP 154/79 and later 132/61.  Labs revealed mild hypokalemia 3.2 with otherwise unremarkable BMP.  CBC was normal.  D-dimer was 0.53.  Respiratory panel came back negative.  Blood cultures were drawn. EKG as reviewed by me :  EKG showed sinus rhythm with a rate of 98 with right bundle branch block. Imaging: Portable chest x-ray showed diffuse interstitial coarsening that may be due to edema or atypical infection.  The patient was given IV Rocephin and Zithromax as well as Tessalon, 4 mg IV Zofran , 40 mg of p.o. potassium chloride and 500 mL IV normal saline bolus. PAST MEDICAL HISTORY:   Past Medical History:  Diagnosis Date   Anginal pain (HCC)    Anxiety 2014   Bursitis    Cancer (HCC) 06/2022   left breast  DCIS   Dyspnea    with exertion   GERD (gastroesophageal reflux disease) 2008   History of radiation therapy    Left breast- 09/17/22-10/14/22- Dr. Lynwood Nasuti   History of radiation therapy    Right breast-09/23/23-10/28/23- Dr. Lynwood Nasuti   Hypercholesteremia 2003   Hypertension 2003   Insomnia 1988   Kidney stone    Osteoarthritis 2008   hands, knees, right hip   Osteopenia 2002   Restless leg syndrome 2000   RLS (restless legs syndrome) 03/28/2016   Vertigo 2002    PAST SURGICAL HISTORY:   Past Surgical History:  Procedure Laterality Date   ABDOMINAL HYSTERECTOMY     APPENDECTOMY     BLEPHAROPLASTY  2019   BREAST LUMPECTOMY WITH RADIOACTIVE SEED LOCALIZATION Left 07/16/2022   Procedure: LEFT BREAST LUMPECTOMY WITH RADIOACTIVE SEED LOCALIZATION;  Surgeon: Vernetta Berg, MD;  Location: [facility];  Service: General;  Laterality: Left;   BREAST LUMPECTOMY WITH RADIOACTIVE SEED LOCALIZATION Right 08/11/2023   Procedure: RADIOACTIVE SEED GUIDED RIGHT BREAST LUMPECTOMY;  Surgeon: Vernetta Berg, MD;  Location: [facility] OR;  Service: General;  Laterality: Right;  LMA   CATARACT EXTRACTION Right 02/2010   CHOLECYSTECTOMY     COLONOSCOPY  2015   results normal   LEFT HEART CATH AND CORONARY ANGIOGRAPHY N/A 09/20/2020   Procedure: LEFT HEART CATH AND CORONARY ANGIOGRAPHY;  Surgeon: Court Dorn PARAS, MD;  Location: [facility];  Service: Cardiovascular;  Laterality: N/A;  RE-EXCISION OF BREAST LUMPECTOMY Left 08/13/2022   Procedure: RE-EXCISION OF LEFT BREAST CANCER;  Surgeon: Vernetta Berg, MD;  Location: [facility];  Service: General;  Laterality: Left;   ROTATOR CUFF REPAIR Right     SOCIAL HISTORY:   Social History   Tobacco Use   Smoking status: Former    Current packs/day: 0.00    Average packs/day: 0.5 packs/day for 20.0 years (10.0 ttl pk-yrs)    Types: Cigarettes    Start date: 10/06/1976    Quit date: 10/06/1996    Years  since quitting: 27.5    Passive exposure: Past   Smokeless tobacco: Never  Substance Use Topics   Alcohol use: No    Alcohol/week: 0.0 standard drinks of alcohol    FAMILY HISTORY:   Family History  Problem Relation Age of Onset   Heart attack Mother    Depression Mother    Stroke Mother    Diabetes Mother    Heart attack Father    COPD Sister    COPD Sister    COPD Brother    COPD Brother    Cancer Maternal Aunt    Breast cancer Maternal Aunt 60   Restless legs syndrome Daughter    Restless legs syndrome Son    Restless legs syndrome Son     DRUG ALLERGIES:   Allergies  Allergen Reactions   Ibuprofen Other (See Comments)    GI Upset   Mobic [Meloxicam] Other (See Comments)    Leg pain   Nabumetone Other (See Comments)    GI Upset   Lexapro [Escitalopram Oxalate] Other (See Comments)    GERD   Lisinopril Cough   Prilosec Otc [Omeprazole Magnesium] Other (See Comments)    Arm spasm   Sulfa Antibiotics Dermatitis and Rash   Zoloft [Sertraline Hcl] Other (See Comments)    Insomnia     REVIEW OF SYSTEMS:   ROS As per history of present illness. All pertinent systems were reviewed above. Constitutional, HEENT, cardiovascular, respiratory, GI, GU, musculoskeletal, neuro, psychiatric, endocrine, integumentary and hematologic systems were reviewed and are otherwise negative/unremarkable except for positive findings mentioned above in the HPI.   MEDICATIONS AT HOME:   Prior to Admission medications   Medication Sig Start Date End Date Taking? Authorizing Provider  acetaminophen  (TYLENOL ) 650 MG CR tablet Take 1,300 mg by mouth every 8 (eight) hours as needed for pain.   Yes [provider]  azithromycin (ZITHROMAX) 250 MG tablet Take 250 mg by mouth as directed. 04/05/24  Yes [provider]  Biotin 5000 MCG TABS Take 1 tablet by mouth daily.   Yes [provider]  calcium carbonate (TUMS - DOSED IN MG ELEMENTAL CALCIUM) 500 MG chewable  tablet Chew 1 tablet by mouth daily as needed for indigestion or heartburn.   Yes [provider]  Calcium Magnesium Zinc 333-133-5 MG TABS Take 1 tablet by mouth daily.   Yes [provider]  cyanocobalamin (VITAMIN B12) 1000 MCG tablet Take 1,000 mcg by mouth daily.   Yes [provider]  doxycycline (MONODOX) 100 MG capsule Take 100 mg by mouth 2 (two) times daily.   Yes [provider]  Flax Oil-Fish Oil-Borage Oil (FISH-FLAX-BORAGE PO) Take 1 capsule by mouth daily.   Yes [provider]  fluticasone  (FLONASE ) 50 MCG/ACT nasal spray Place 1 spray into both nostrils daily. Patient taking differently: Place 1 spray into both nostrils daily as needed for allergies. 07/11/21  Yes Lamptey, Aleene KIDD, MD  irbesartan (AVAPRO) 300 MG tablet Take 300 mg by mouth daily. 10/11/19  Yes [provider]  isosorbide  mononitrate (IMDUR ) 30 MG 24 hr tablet Take 1/2 (one-half) tablet by mouth once daily Patient taking differently: Take 15 mg by mouth daily. 02/22/24  Yes Court Dorn PARAS, MD  letrozole  (FEMARA ) 2.5 MG tablet Take 1 tablet (2.5 mg total) by mouth daily. 11/24/23  Yes Causey, Lindsey Cornetto, NP  LORazepam (ATIVAN) 0.5 MG tablet Take 0.5 mg by mouth 2 (two) times daily as needed for anxiety. 02/11/16  Yes [provider]  metoprolol  succinate (TOPROL  XL) 25 MG 24 hr tablet Take 0.5 tablets (12.5 mg total) by mouth daily. 09/16/23  Yes Cleaver, Josefa HERO, NP  Misc Natural Products (OSTEO BI-FLEX TRIPLE STRENGTH) TABS Take 1 tablet by mouth daily.   Yes [provider]  Multiple Minerals-Vitamins (CAL MAG ZINC +D3 PO) Take 1 tablet by mouth daily.   Yes [provider]  multivitamin-lutein (OCUVITE-LUTEIN) CAPS Take 1 capsule by mouth daily.   Yes [provider]  omeprazole (PRILOSEC) 40 MG capsule Take 40 mg by mouth daily. 02/06/16  Yes [provider]  rOPINIRole  (REQUIP ) 1 MG tablet Take 1 tablet (1 mg  total) by mouth 3 (three) times daily. Patient taking differently: Take 1-2 mg by mouth See admin instructions. Take 1 mg by mouth in the morning & take 2 mg at night. 10/20/18  Yes Patel, Donika K, DO  simvastatin (ZOCOR) 20 MG tablet Take 20 mg by mouth every evening. 02/06/16  Yes [provider]      VITAL SIGNS:  Blood pressure 123/64, pulse 87, temperature 97.6 F (36.4 C), temperature source Oral, resp. rate 17, height 5' 2 (1.575 m), weight 67.6 kg, SpO2 97%.  PHYSICAL EXAMINATION:  Physical Exam  GENERAL:  83 y.o.-year-old patient lying in the bed with mild respiratory distress with conversational dyspnea.   EYES: Pupils equal, round, reactive to light and accommodation. No scleral icterus. Extraocular muscles intact.  HEENT: Head atraumatic, normocephalic. Oropharynx and nasopharynx clear.  NECK:  Supple, no jugular venous distention. No thyroid  enlargement, no tenderness.  LUNGS: Diminished bibasilar breath sounds with bibasal crackles and occasional rhonchi. CARDIOVASCULAR: Regular rate and rhythm, S1, S2 normal. No murmurs, rubs, or gallops.  ABDOMEN: Soft, nondistended, nontender. Bowel sounds present. No organomegaly or mass.  EXTREMITIES: No pedal edema, cyanosis, or clubbing.  NEUROLOGIC: Cranial nerves II through XII are intact. Muscle strength 5/5 in all extremities. Sensation intact. Gait not checked.  PSYCHIATRIC: The patient is alert and oriented x 3.  Normal affect and good eye contact. SKIN: No obvious rash, lesion, or ulcer.   LABORATORY PANEL:   CBC Recent Labs  Lab 04/06/24 1911  WBC 6.6  HGB 13.0  HCT 38.7  PLT 247   ------------------------------------------------------------------------------------------------------------------  Chemistries  Recent Labs  Lab 04/06/24 1911  NA 136  K 3.2*  CL 100  CO2 22  GLUCOSE 97  BUN 17  CREATININE 0.86  CALCIUM 9.4    ------------------------------------------------------------------------------------------------------------------  Cardiac Enzymes No results for input(s): TROPONINI in the last 168 hours. ------------------------------------------------------------------------------------------------------------------  RADIOLOGY:  DG Chest Port 1 View Result Date: 04/06/2024 CLINICAL DATA:  Shortness of breath and weakness EXAM: PORTABLE CHEST 1 VIEW COMPARISON:  06/11/2023 FINDINGS: Stable cardiomediastinal silhouette. Aortic atherosclerotic calcification. Diffuse interstitial coarsening. No focal consolidation, pleural effusion, or pneumothorax. IMPRESSION: Diffuse interstitial coarsening which may be due to edema or atypical infection. Electronically Signed   By: Norman Charletta HERO.D.  On: 04/06/2024 19:10      IMPRESSION AND PLAN:  Assessment and Plan: * Sepsis due to pneumonia [facility]) - The patient will be admitted to a medical telemetry bed. - The patient has subsequent acute respiratory failure with hypoxia - Will continue antibiotic therapy with IV Rocephin and Zithromax. - Mucolytic therapy be provided as well as duo nebs q.i.d. and q.4 hours p.r.n. - We will follow blood cultures. -The patient will be hydrated with IV lactated ringer . - Sepsis manifested by tachycardia and tachypnea  GERD without esophagitis - Will continue PPI therapy.  Restless leg syndrome - Continue Requip .  Dyslipidemia - Will continue statin therapy.  Essential hypertension - Will continue antihypertensive therapy.     DVT prophylaxis: Lovenox.  Advanced Care Planning:  Code Status: full code.  Family Communication:  The plan of care was discussed in details with the patient (and family). I answered all questions. The patient agreed to proceed with the above mentioned plan. Further management will depend upon hospital course. Disposition Plan: Back to previous home environment Consults called: none.   All the records are reviewed and case discussed with ED provider.  Status is: Inpatient  At the time of the admission, it appears that the appropriate admission status for this patient is inpatient.  This is judged to be reasonable and necessary in order to provide the required intensity of service to ensure the patient's safety given the presenting symptoms, physical exam findings and initial radiographic and laboratory data in the context of comorbid conditions.  The patient requires inpatient status due to high intensity of service, high risk of further deterioration and high frequency of surveillance required.  I certify that at the time of admission, it is my clinical judgment that the patient will require inpatient hospital care extending more than 2 midnights.                            Dispo: The patient is from: Home              Anticipated d/c is to: Home              Patient currently is not medically stable to d/c.              Difficult to place patient: No  Madison DELENA Peaches M.D on 04/07/2024 at 3:58 AM  [facility]   From 7 PM-7 AM, contact night-coverage [email or web address]  CC: Primary care physician; Auston Opal, DO

## 2024-04-07 NOTE — Progress Notes (Signed)
 PROGRESS NOTE    Alicia [name]  FMW:[id number] DOB: July 25, 1941 DOA: 04/06/2024 PCP: Auston Opal, DO   Brief Narrative:    Alicia [name] is a 83 y.o. Caucasian female with medical history significant for hypertension dyslipidemia, GERD, osteoarthritis, restless leg syndrome and anxiety, who directed emergency room with acute onset of worsening dyspnea with associated cough with difficulty with expectoration as well as chest tightness with cough and posttussive vomiting, since Saturday.  The patient was seen in urgent care and was given p.o. Zithromax without significant improvement.  Patient was admitted with acute hypoxemic respiratory failure secondary to sepsis, POA, due to community-acquired pneumonia.  Assessment & Plan:   Principal Problem:   Sepsis due to pneumonia [facility]) Active Problems:   Essential hypertension   Dyslipidemia   Restless leg syndrome   GERD without esophagitis  Assessment and Plan:  Acute hypoxemic respiratory failure secondary to sepsis, POA, due to pneumonia (HCC) - Will continue antibiotic therapy with IV Rocephin and Zithromax. - Mucolytic therapy be provided as well as duo nebs q.i.d. and q.4 hours p.r.n. - We will follow blood cultures. - Sepsis manifested by tachycardia and tachypnea  Mild hypokalemia -Replete and reevaluate in a.m.   GERD without esophagitis - Will continue PPI therapy.   Restless leg syndrome - Continue Requip .   Dyslipidemia - Will continue statin therapy.   Essential hypertension - Will continue antihypertensive therapy.   DVT prophylaxis:Lovenox Code Status: Full Family Communication: None at bedside Disposition Plan:  Status is: Inpatient Remains inpatient appropriate because: Need for IV medications.   Consultants:  None  Procedures:  None  Antimicrobials:  Anti-infectives (From admission, onward)    Start     Dose/Rate Route Frequency Ordered Stop   04/07/24 2200  azithromycin (ZITHROMAX) 500 mg in  sodium chloride  0.9 % 250 mL IVPB        500 mg 250 mL/hr over 60 Minutes Intravenous Every 24 hours 04/07/24 0129 04/12/24 2159   04/07/24 1200  cefTRIAXone (ROCEPHIN) 2 g in sodium chloride  0.9 % 100 mL IVPB        2 g 200 mL/hr over 30 Minutes Intravenous Every 24 hours 04/07/24 0129 04/12/24 1159   04/06/24 2315  cefTRIAXone (ROCEPHIN) 1 g in sodium chloride  0.9 % 100 mL IVPB        1 g 200 mL/hr over 30 Minutes Intravenous  Once 04/06/24 2312 04/07/24 0019   04/06/24 2315  azithromycin (ZITHROMAX) tablet 500 mg        500 mg Oral  Once 04/06/24 2312 04/07/24 0006       Subjective: Patient seen and evaluated today with no new acute complaints or concerns. No acute concerns or events noted overnight.  Continues to feel unwell with mild cough.  Objective: Vitals:   04/07/24 0030 04/07/24 0129 04/07/24 0521 04/07/24 0712  BP:  123/64 (!) 130/59   Pulse: 98 87 79   Resp: (!) 21 17 17    Temp:  97.6 F (36.4 C) 97.7 F (36.5 C)   TempSrc:  Oral Oral   SpO2: 95% 97% 98% 99%  Weight:   68.2 kg   Height:        Intake/Output Summary (Last 24 hours) at 04/07/2024 0903 Last data filed at 04/07/2024 0500 Gross per 24 hour  Intake 240 ml  Output --  Net 240 ml   Filed Weights   04/06/24 1829 04/07/24 0521  Weight: 67.6 kg 68.2 kg    Examination:  General exam: Appears  calm and comfortable  Respiratory system: Clear to auscultation. Respiratory effort normal.  Currently on 1 L nasal cannula. Cardiovascular system: S1 & S2 heard, RRR.  Gastrointestinal system: Abdomen is soft Central nervous system: Alert and awake Extremities: No edema Skin: No significant lesions noted Psychiatry: Flat affect.    Data Reviewed: I have personally reviewed following labs and imaging studies  CBC: Recent Labs  Lab 04/06/24 1911 04/07/24 0422  WBC 6.6 4.9  HGB 13.0 11.3*  HCT 38.7 34.1*  MCV 87.2 88.6  PLT 247 211   Basic Metabolic Panel: Recent Labs  Lab 04/06/24 1911  04/07/24 0422  NA 136 135  K 3.2* 3.4*  CL 100 104  CO2 22 25  GLUCOSE 97 105*  BUN 17 17  CREATININE 0.86 0.92  CALCIUM 9.4 9.0   GFR: Estimated Creatinine Clearance: 43.4 mL/min (by C-G formula based on SCr of 0.92 mg/dL). Liver Function Tests: No results for input(s): AST, ALT, ALKPHOS, BILITOT, PROT, ALBUMIN  in the last 168 hours. No results for input(s): LIPASE, AMYLASE in the last 168 hours. No results for input(s): AMMONIA in the last 168 hours. Coagulation Profile: Recent Labs  Lab 04/07/24 0422  INR 1.1   Cardiac Enzymes: No results for input(s): CKTOTAL, CKMB, CKMBINDEX, TROPONINI in the last 168 hours. BNP (last 3 results) No results for input(s): PROBNP in the last 8760 hours. HbA1C: No results for input(s): HGBA1C in the last 72 hours. CBG: No results for input(s): GLUCAP in the last 168 hours. Lipid Profile: No results for input(s): CHOL, HDL, LDLCALC, TRIG, CHOLHDL, LDLDIRECT in the last 72 hours. Thyroid  Function Tests: No results for input(s): TSH, T4TOTAL, FREET4, T3FREE, THYROIDAB in the last 72 hours. Anemia Panel: No results for input(s): VITAMINB12, FOLATE, FERRITIN, TIBC, IRON, RETICCTPCT in the last 72 hours. Sepsis Labs: Recent Labs  Lab 04/06/24 1911  LATICACIDVEN 1.2    Recent Results (from the past 240 hours)  Blood culture (routine x 2)     Status: None (Preliminary result)   Collection Time: 04/06/24  7:11 PM   Specimen: BLOOD  Result Value Ref Range Status   Specimen Description BLOOD LEFT ANTECUBITAL  Final   Special Requests   Final    BOTTLES DRAWN AEROBIC AND ANAEROBIC Blood Culture results may not be optimal due to an inadequate volume of blood received in culture bottles Performed at [facility], [address], [place], [place] [address]    Culture PENDING  Incomplete   Report Status PENDING  Incomplete  Blood culture (routine x 2)     Status: None  (Preliminary result)   Collection Time: 04/06/24  7:11 PM   Specimen: BLOOD  Result Value Ref Range Status   Specimen Description BLOOD RIGHT ANTECUBITAL  Final   Special Requests   Final    BOTTLES DRAWN AEROBIC AND ANAEROBIC Blood Culture adequate volume Performed at [facility], [address], [place], [place] [address]    Culture PENDING  Incomplete   Report Status PENDING  Incomplete  Resp panel by RT-PCR (RSV, Flu A&B, Covid) Anterior Nasal Swab     Status: None   Collection Time: 04/06/24 10:03 PM   Specimen: Anterior Nasal Swab  Result Value Ref Range Status   SARS Coronavirus 2 by RT PCR NEGATIVE NEGATIVE Final    Comment: (NOTE) SARS-CoV-2 target nucleic acids are NOT DETECTED.  The SARS-CoV-2 RNA is generally detectable in upper respiratory specimens during the acute phase of infection. The lowest concentration of SARS-CoV-2 viral copies  this assay can detect is 138 copies/mL. A negative result does not preclude SARS-Cov-2 infection and should not be used as the sole basis for treatment or other patient management decisions. A negative result may occur with  improper specimen collection/handling, submission of specimen other than nasopharyngeal swab, presence of viral mutation(s) within the areas targeted by this assay, and inadequate number of viral copies(<138 copies/mL). A negative result must be combined with clinical observations, patient history, and epidemiological information. The expected result is Negative.  Fact Sheet for Patients:  BloggerCourse.com  Fact Sheet for Healthcare Providers:  SeriousBroker.it  This test is no t yet approved or cleared by the [place]  FDA and  has been authorized for detection and/or diagnosis of SARS-CoV-2 by FDA under an Emergency Use Authorization (EUA). This EUA will remain  in effect (meaning this test can be used) for the duration of the COVID-19 declaration under  Section 564(b)(1) of the Act, 21 U.S.C.section 360bbb-3(b)(1), unless the authorization is terminated  or revoked sooner.       Influenza A by PCR NEGATIVE NEGATIVE Final   Influenza B by PCR NEGATIVE NEGATIVE Final    Comment: (NOTE) The Xpert Xpress SARS-CoV-2/FLU/RSV plus assay is intended as an aid in the diagnosis of influenza from Nasopharyngeal swab specimens and should not be used as a sole basis for treatment. Nasal washings and aspirates are unacceptable for Xpert Xpress SARS-CoV-2/FLU/RSV testing.  Fact Sheet for Patients: BloggerCourse.com  Fact Sheet for Healthcare Providers: SeriousBroker.it  This test is not yet approved or cleared by the [place]  FDA and has been authorized for detection and/or diagnosis of SARS-CoV-2 by FDA under an Emergency Use Authorization (EUA). This EUA will remain in effect (meaning this test can be used) for the duration of the COVID-19 declaration under Section 564(b)(1) of the Act, 21 U.S.C. section 360bbb-3(b)(1), unless the authorization is terminated or revoked.     Resp Syncytial Virus by PCR NEGATIVE NEGATIVE Final    Comment: (NOTE) Fact Sheet for Patients: BloggerCourse.com  Fact Sheet for Healthcare Providers: SeriousBroker.it  This test is not yet approved or cleared by the [place]  FDA and has been authorized for detection and/or diagnosis of SARS-CoV-2 by FDA under an Emergency Use Authorization (EUA). This EUA will remain in effect (meaning this test can be used) for the duration of the COVID-19 declaration under Section 564(b)(1) of the Act, 21 U.S.C. section 360bbb-3(b)(1), unless the authorization is terminated or revoked.  Performed at [facility], [address] [place], [place] [address]          Radiology Studies: [facility] Chest [facility] 1 View Result Date: 04/06/2024 CLINICAL DATA:  Shortness of breath  and weakness EXAM: PORTABLE CHEST 1 VIEW COMPARISON:  06/11/2023 FINDINGS: Stable cardiomediastinal silhouette. Aortic atherosclerotic calcification. Diffuse interstitial coarsening. No focal consolidation, pleural effusion, or pneumothorax. IMPRESSION: Diffuse interstitial coarsening which may be due to edema or atypical infection. Electronically Signed   By: Norman Gatlin M.D.   On: 04/06/2024 19:10        Scheduled Meds:  calcium carbonate  1 tablet Oral Q breakfast   And   magnesium oxide  200 mg Oral Daily   And   zinc sulfate (50mg  elemental zinc)  220 mg Oral Daily   cyanocobalamin  1,000 mcg Oral Daily   enoxaparin (LOVENOX) injection  40 mg Subcutaneous Q24H   guaiFENesin  600 mg Oral BID   ipratropium-albuterol  3 mL Nebulization TID   irbesartan  300 mg Oral Daily  isosorbide  mononitrate  15 mg Oral Daily   letrozole   2.5 mg Oral Daily   metoprolol  succinate  12.5 mg Oral Daily   multivitamin-lutein  1 capsule Oral Daily   omega-3 acid ethyl esters  1 g Oral Daily   pantoprazole  40 mg Oral Daily   potassium chloride SA  40 mEq Oral BID   rOPINIRole   1 mg Oral Daily   And   rOPINIRole   2 mg Oral QHS   simvastatin  20 mg Oral QPM   Continuous Infusions:  azithromycin     cefTRIAXone (ROCEPHIN)  IV       LOS: 1 day    Time spent: 55 minutes    Lowry Bala JONETTA Fairly, DO [facility]  If 7PM-7AM, please contact night-coverage [email or web address] 04/07/2024, 9:03 AM

## 2024-04-08 DIAGNOSIS — J189 Pneumonia, unspecified organism: Secondary | ICD-10-CM | POA: Diagnosis not present

## 2024-04-08 DIAGNOSIS — A419 Sepsis, unspecified organism: Secondary | ICD-10-CM | POA: Diagnosis not present

## 2024-04-08 LAB — MAGNESIUM: Magnesium: 1.8 mg/dL (ref 1.7–2.4)

## 2024-04-08 LAB — RETICULOCYTES
Immature Retic Fract: 16 % — ABNORMAL HIGH (ref 2.3–15.9)
RBC.: 3.74 MIL/uL — ABNORMAL LOW (ref 3.87–5.11)
Retic Count, Absolute: 43.8 K/uL (ref 19.0–186.0)
Retic Ct Pct: 1.2 % (ref 0.4–3.1)

## 2024-04-08 LAB — IRON AND TIBC
Iron: 21 ug/dL — ABNORMAL LOW (ref 28–170)
Saturation Ratios: 8 % — ABNORMAL LOW (ref 10.4–31.8)
TIBC: 279 ug/dL (ref 250–450)
UIBC: 258 ug/dL

## 2024-04-08 LAB — CBC
HCT: 33.4 % — ABNORMAL LOW (ref 36.0–46.0)
Hemoglobin: 10.5 g/dL — ABNORMAL LOW (ref 12.0–15.0)
MCH: 28.1 pg (ref 26.0–34.0)
MCHC: 31.4 g/dL (ref 30.0–36.0)
MCV: 89.3 fL (ref 80.0–100.0)
Platelets: 203 K/uL (ref 150–400)
RBC: 3.74 MIL/uL — ABNORMAL LOW (ref 3.87–5.11)
RDW: 13.1 % (ref 11.5–15.5)
WBC: 4.8 K/uL (ref 4.0–10.5)
nRBC: 0 % (ref 0.0–0.2)

## 2024-04-08 LAB — BASIC METABOLIC PANEL WITH GFR
Anion gap: 5 (ref 5–15)
BUN: 14 mg/dL (ref 8–23)
CO2: 28 mmol/L (ref 22–32)
Calcium: 9.1 mg/dL (ref 8.9–10.3)
Chloride: 104 mmol/L (ref 98–111)
Creatinine, Ser: 0.8 mg/dL (ref 0.44–1.00)
GFR, Estimated: >60 mL/min (ref >60)
Glucose, Bld: 95 mg/dL (ref 70–99)
Potassium: 4.1 mmol/L (ref 3.5–5.1)
Sodium: 137 mmol/L (ref 135–145)

## 2024-04-08 LAB — VITAMIN B12: Vitamin B-12: 856 pg/mL (ref 180–914)

## 2024-04-08 LAB — FERRITIN: Ferritin: 40 ng/mL (ref 11–307)

## 2024-04-08 LAB — FOLATE: Folate: 10.9 ng/mL (ref >5.9)

## 2024-04-08 MED ORDER — AZITHROMYCIN 250 MG PO TABS
250.0000 mg | ORAL_TABLET | Freq: Every day | ORAL | 0 refills | Status: AC
Start: 2024-04-08 — End: 2024-04-11

## 2024-04-08 MED ORDER — ALBUTEROL SULFATE HFA 108 (90 BASE) MCG/ACT IN AERS: 2.0000 | INHALATION_SPRAY | Freq: Four times a day (QID) | RESPIRATORY_TRACT | 2 refills | Status: AC | PRN

## 2024-04-08 MED ORDER — GUAIFENESIN ER 600 MG PO TB12: 600.0000 mg | ORAL_TABLET | Freq: Two times a day (BID) | ORAL | 0 refills | Status: AC

## 2024-04-08 MED ORDER — HYDROCOD POLI-CHLORPHE POLI ER 10-8 MG/5ML PO SUER: 5.0000 mL | Freq: Two times a day (BID) | ORAL | 0 refills | Status: DC | PRN

## 2024-04-08 MED ORDER — DOCUSATE SODIUM 100 MG PO CAPS
100.0000 mg | ORAL_CAPSULE | Freq: Every day | ORAL | 2 refills | Status: AC
Start: 2024-04-08 — End: 2025-04-08

## 2024-04-08 MED ORDER — BENZONATATE 200 MG PO CAPS: 200.0000 mg | ORAL_CAPSULE | Freq: Three times a day (TID) | ORAL | 0 refills | Status: DC | PRN

## 2024-04-08 MED ORDER — FERROUS SULFATE 325 (65 FE) MG PO TBEC: 325.0000 mg | DELAYED_RELEASE_TABLET | Freq: Every day | ORAL | 3 refills | Status: AC

## 2024-04-08 MED ORDER — CEFDINIR 300 MG PO CAPS: 300.0000 mg | ORAL_CAPSULE | Freq: Two times a day (BID) | ORAL | 0 refills | Status: AC

## 2024-04-08 NOTE — Discharge Summary (Signed)
 Physician Discharge Summary  Alicia [name] FMW:[id number] DOB: [birth year] DOA: 04/06/2024  PCP: Auston Opal, DO  Admit date: 04/06/2024  Discharge date: 04/08/2024  Admitted From:Home  Disposition:  Home  Recommendations for Outpatient Follow-up:  Follow up with PCP in 1-2 weeks Continue on iron supplements and Colace as prescribed and follow-up CBC in the next 1-2 weeks with PCP follow-up Continue antibiotics as prescribed with Omnicef  and azithromycin  for 3 more days to complete course of treatment for pneumonia Multiple antitussives and mucolytic's prescribed due to significant symptomatology Inhaler provided as needed for shortness of breath or wheezing Continue other home medications as prior  Home Health: None  Equipment/Devices: None  Discharge Condition:Stable  CODE STATUS: Full  Diet recommendation: Heart Healthy  Brief/Interim Summary: Alicia [name] is a 83 y.o. Caucasian female with medical history significant for hypertension dyslipidemia, GERD, osteoarthritis, restless leg syndrome and anxiety, who directed emergency room with acute onset of worsening dyspnea with associated cough with difficulty with expectoration as well as chest tightness with cough and posttussive vomiting, since Saturday.  The patient was seen in urgent care and was given p.o. Zithromax  without significant improvement.  Patient was admitted with acute hypoxemic respiratory failure secondary to sepsis, POA, due to community-acquired pneumonia.  Patient has now weaned off of oxygen and feels much better and is overall stable for discharge.  No other acute events or concerns noted.  Discharge Diagnoses:  Principal Problem:   Sepsis due to pneumonia [facility]) Active Problems:   Essential hypertension   Dyslipidemia   Restless leg syndrome   GERD without esophagitis  Principal discharge diagnosis: Acute hypoxemic respiratory failure secondary to sepsis, POA due to community-acquired  pneumonia.  Discharge Instructions  Discharge Instructions     Diet - low sodium heart healthy   Complete by: As directed    Increase activity slowly   Complete by: As directed       Allergies as of 04/08/2024       Reactions   Ibuprofen Other (See Comments)   GI Upset   Mobic [meloxicam] Other (See Comments)   Leg pain   Nabumetone Other (See Comments)   GI Upset   Lexapro [escitalopram Oxalate] Other (See Comments)   GERD   Lisinopril Cough   Prilosec Otc [omeprazole Magnesium ] Other (See Comments)   Arm spasm   Sulfa Antibiotics Dermatitis, Rash   Zoloft [sertraline Hcl] Other (See Comments)   Insomnia         Medication List     STOP taking these medications    doxycycline 100 MG capsule Commonly known as: MONODOX       TAKE these medications    acetaminophen  650 MG CR tablet Commonly known as: TYLENOL  Take 1,300 mg by mouth every 8 (eight) hours as needed for pain.   albuterol  108 (90 Base) MCG/ACT inhaler Commonly known as: VENTOLIN  HFA Inhale 2 puffs into the lungs every 6 (six) hours as needed for wheezing or shortness of breath.   azithromycin  250 MG tablet Commonly known as: ZITHROMAX  Take 1 tablet (250 mg total) by mouth daily for 3 days. What changed: when to take this   benzonatate  200 MG capsule Commonly known as: TESSALON  Take 1 capsule (200 mg total) by mouth 3 (three) times daily as needed for cough.   Biotin  5000 MCG Tabs Take 1 tablet by mouth daily.   CAL MAG ZINC  +D3 PO Take 1 tablet by mouth daily.   calcium  carbonate 500 MG chewable tablet Commonly known  as: TUMS - dosed in mg elemental calcium  Chew 1 tablet by mouth daily as needed for indigestion or heartburn.   Calcium  Magnesium  Zinc  333-133-5 MG Tabs Take 1 tablet by mouth daily.   cefdinir  300 MG capsule Commonly known as: OMNICEF  Take 1 capsule (300 mg total) by mouth 2 (two) times daily for 3 days.   chlorpheniramine-HYDROcodone 10-8 MG/5ML Commonly known  as: TUSSIONEX Take 5 mLs by mouth every 12 (twelve) hours as needed for cough.   cyanocobalamin  1000 MCG tablet Commonly known as: VITAMIN B12 Take 1,000 mcg by mouth daily.   docusate sodium  100 MG capsule Commonly known as: Colace Take 1 capsule (100 mg total) by mouth daily.   ferrous sulfate  325 (65 FE) MG EC tablet Take 1 tablet (325 mg total) by mouth daily with breakfast.   FISH-FLAX-BORAGE PO Take 1 capsule by mouth daily.   fluticasone  50 MCG/ACT nasal spray Commonly known as: FLONASE  Place 1 spray into both nostrils daily. What changed:  when to take this reasons to take this   guaiFENesin  600 MG 12 hr tablet Commonly known as: MUCINEX  Take 1 tablet (600 mg total) by mouth 2 (two) times daily for 5 days.   irbesartan  300 MG tablet Commonly known as: AVAPRO  Take 300 mg by mouth daily.   isosorbide  mononitrate 30 MG 24 hr tablet Commonly known as: IMDUR  Take 1/2 (one-half) tablet by mouth once daily What changed: See the new instructions.   letrozole  2.5 MG tablet Commonly known as: FEMARA  Take 1 tablet (2.5 mg total) by mouth daily.   LORazepam  0.5 MG tablet Commonly known as: ATIVAN  Take 0.5 mg by mouth 2 (two) times daily as needed for anxiety.   metoprolol  succinate 25 MG 24 hr tablet Commonly known as: Toprol  XL Take 0.5 tablets (12.5 mg total) by mouth daily.   multivitamin-lutein  Caps capsule Take 1 capsule by mouth daily.   omeprazole 40 MG capsule Commonly known as: PRILOSEC Take 40 mg by mouth daily.   Osteo Bi-Flex Triple Strength Tabs Take 1 tablet by mouth daily.   rOPINIRole  1 MG tablet Commonly known as: REQUIP  Take 1 tablet (1 mg total) by mouth 3 (three) times daily. What changed:  how much to take when to take this additional instructions   simvastatin  20 MG tablet Commonly known as: ZOCOR  Take 20 mg by mouth every evening.        Follow-up Information     Auston Opal, DO. Schedule an appointment as soon as  possible for a visit in 1 week(s).   Specialty: Family Medicine Contact information: [address] [place] [address] [phone]                Allergies  Allergen Reactions   Ibuprofen Other (See Comments)    GI Upset   Mobic [Meloxicam] Other (See Comments)    Leg pain   Nabumetone Other (See Comments)    GI Upset   Lexapro [Escitalopram Oxalate] Other (See Comments)    GERD   Lisinopril Cough   Prilosec Otc [Omeprazole Magnesium ] Other (See Comments)    Arm spasm   Sulfa Antibiotics Dermatitis and Rash   Zoloft [Sertraline Hcl] Other (See Comments)    Insomnia     Consultations: None   Procedures/Studies: DG Chest Port 1 View Result Date: 04/06/2024 CLINICAL DATA:  Shortness of breath and weakness EXAM: PORTABLE CHEST 1 VIEW COMPARISON:  06/11/2023 FINDINGS: Stable cardiomediastinal silhouette. Aortic atherosclerotic calcification. Diffuse interstitial coarsening. No focal  consolidation, pleural effusion, or pneumothorax. IMPRESSION: Diffuse interstitial coarsening which may be due to edema or atypical infection. Electronically Signed   By: Norman Gatlin M.D.   On: 04/06/2024 19:10     Discharge Exam: Vitals:   04/08/24 0404 04/08/24 0717  BP: (!) 159/69   Pulse: 81   Resp: (!) 24   Temp: 98.6 F (37 C)   SpO2: 97% 90%   Vitals:   04/07/24 1946 04/07/24 2022 04/08/24 0404 04/08/24 0717  BP:  (!) 155/73 (!) 159/69   Pulse:  93 81   Resp:  (!) 24 (!) 24   Temp:  98 F (36.7 C) 98.6 F (37 C)   TempSrc:  Oral Oral   SpO2: 96% 95% 97% 90%  Weight:      Height:        General: Pt is alert, awake, not in acute distress Cardiovascular: RRR, S1/S2 +, no rubs, no gallops Respiratory: CTA bilaterally, no wheezing, no rhonchi Abdominal: Soft, NT, ND, bowel sounds + Extremities: no edema, no cyanosis    The results of significant diagnostics from this hospitalization (including imaging, microbiology, ancillary and laboratory)  are listed below for reference.     Microbiology: Recent Results (from the past 240 hours)  Blood culture (routine x 2)     Status: None (Preliminary result)   Collection Time: 04/06/24  7:11 PM   Specimen: BLOOD  Result Value Ref Range Status   Specimen Description BLOOD LEFT ANTECUBITAL  Final   Special Requests   Final    BOTTLES DRAWN AEROBIC AND ANAEROBIC Blood Culture results may not be optimal due to an inadequate volume of blood received in culture bottles Performed at [facility], [address] [place], [place] [address]    Culture PENDING  Incomplete   Report Status PENDING  Incomplete  Blood culture (routine x 2)     Status: None (Preliminary result)   Collection Time: 04/06/24  7:11 PM   Specimen: BLOOD  Result Value Ref Range Status   Specimen Description BLOOD RIGHT ANTECUBITAL  Final   Special Requests   Final    BOTTLES DRAWN AEROBIC AND ANAEROBIC Blood Culture adequate volume Performed at [facility], [address], [place], [place] [address]    Culture PENDING  Incomplete   Report Status PENDING  Incomplete  Resp panel by RT-PCR (RSV, Flu A&B, Covid) Anterior Nasal Swab     Status: None   Collection Time: 04/06/24 10:03 PM   Specimen: Anterior Nasal Swab  Result Value Ref Range Status   SARS Coronavirus 2 by RT PCR NEGATIVE NEGATIVE Final    Comment: (NOTE) SARS-CoV-2 target nucleic acids are NOT DETECTED.  The SARS-CoV-2 RNA is generally detectable in upper respiratory specimens during the acute phase of infection. The lowest concentration of SARS-CoV-2 viral copies this assay can detect is 138 copies/mL. A negative result does not preclude SARS-Cov-2 infection and should not be used as the sole basis for treatment or other patient management decisions. A negative result may occur with  improper specimen collection/handling, submission of specimen other than nasopharyngeal swab, presence of viral mutation(s) within the areas targeted by this assay, and  inadequate number of viral copies(<138 copies/mL). A negative result must be combined with clinical observations, patient history, and epidemiological information. The expected result is Negative.  Fact Sheet for Patients:  BloggerCourse.com  Fact Sheet for Healthcare Providers:  SeriousBroker.it  This test is no t yet approved or cleared by the [place]  FDA and  has been authorized for detection and/or diagnosis of SARS-CoV-2 by FDA under an Emergency Use Authorization (EUA). This EUA will remain  in effect (meaning this test can be used) for the duration of the COVID-19 declaration under Section 564(b)(1) of the Act, 21 U.S.C.section 360bbb-3(b)(1), unless the authorization is terminated  or revoked sooner.       Influenza A by PCR NEGATIVE NEGATIVE Final   Influenza B by PCR NEGATIVE NEGATIVE Final    Comment: (NOTE) The Xpert Xpress SARS-CoV-2/FLU/RSV plus assay is intended as an aid in the diagnosis of influenza from Nasopharyngeal swab specimens and should not be used as a sole basis for treatment. Nasal washings and aspirates are unacceptable for Xpert Xpress SARS-CoV-2/FLU/RSV testing.  Fact Sheet for Patients: BloggerCourse.com  Fact Sheet for Healthcare Providers: SeriousBroker.it  This test is not yet approved or cleared by the [place]  FDA and has been authorized for detection and/or diagnosis of SARS-CoV-2 by FDA under an Emergency Use Authorization (EUA). This EUA will remain in effect (meaning this test can be used) for the duration of the COVID-19 declaration under Section 564(b)(1) of the Act, 21 U.S.C. section 360bbb-3(b)(1), unless the authorization is terminated or revoked.     Resp Syncytial Virus by PCR NEGATIVE NEGATIVE Final    Comment: (NOTE) Fact Sheet for Patients: BloggerCourse.com  Fact Sheet for Healthcare  Providers: SeriousBroker.it  This test is not yet approved or cleared by the [place]  FDA and has been authorized for detection and/or diagnosis of SARS-CoV-2 by FDA under an Emergency Use Authorization (EUA). This EUA will remain in effect (meaning this test can be used) for the duration of the COVID-19 declaration under Section 564(b)(1) of the Act, 21 U.S.C. section 360bbb-3(b)(1), unless the authorization is terminated or revoked.  Performed at [facility], [address], [place], [place] [address]      Labs: BNP (last 3 results) No results for input(s): BNP in the last 8760 hours. Basic Metabolic Panel: Recent Labs  Lab 04/06/24 1911 04/07/24 0422 04/08/24 0418  NA 136 135 137  K 3.2* 3.4* 4.1  CL 100 104 104  CO2 22 25 28   GLUCOSE 97 105* 95  BUN 17 17 14   CREATININE 0.86 0.92 0.80  CALCIUM  9.4 9.0 9.1  MG  --   --  1.8   Liver Function Tests: No results for input(s): AST, ALT, ALKPHOS, BILITOT, PROT, ALBUMIN  in the last 168 hours. No results for input(s): LIPASE, AMYLASE in the last 168 hours. No results for input(s): AMMONIA in the last 168 hours. CBC: Recent Labs  Lab 04/06/24 1911 04/07/24 0422 04/08/24 0418  WBC 6.6 4.9 4.8  HGB 13.0 11.3* 10.5*  HCT 38.7 34.1* 33.4*  MCV 87.2 88.6 89.3  PLT 247 211 203   Cardiac Enzymes: No results for input(s): CKTOTAL, CKMB, CKMBINDEX, TROPONINI in the last 168 hours. BNP: Invalid input(s): POCBNP CBG: No results for input(s): GLUCAP in the last 168 hours. D-Dimer Recent Labs    04/06/24 1911  DDIMER 0.53*   Hgb A1c No results for input(s): HGBA1C in the last 72 hours. Lipid Profile No results for input(s): CHOL, HDL, LDLCALC, TRIG, CHOLHDL, LDLDIRECT in the last 72 hours. Thyroid  function studies No results for input(s): TSH, T4TOTAL, T3FREE, THYROIDAB in the last 72 hours.  Invalid input(s): FREET3 Anemia work  up Recent Labs    04/08/24 0418  VITAMINB12 856  FOLATE 10.9  FERRITIN 40  TIBC 279  IRON 21*  RETICCTPCT 1.2   Urinalysis  Component Value Date/Time   COLORURINE YELLOW 10/08/2018 2215   APPEARANCEUR HAZY (A) 10/08/2018 2215   LABSPEC 1.010 10/08/2018 2215   PHURINE 7.0 10/08/2018 2215   GLUCOSEU NEGATIVE 10/08/2018 2215   HGBUR MODERATE (A) 10/08/2018 2215   BILIRUBINUR NEGATIVE 10/08/2018 2215   KETONESUR NEGATIVE 10/08/2018 2215   PROTEINUR NEGATIVE 10/08/2018 2215   UROBILINOGEN 0.2 11/24/2012 1232   NITRITE NEGATIVE 10/08/2018 2215   LEUKOCYTESUR NEGATIVE 10/08/2018 2215   Sepsis Labs Recent Labs  Lab 04/06/24 1911 04/07/24 0422 04/08/24 0418  WBC 6.6 4.9 4.8   Microbiology Recent Results (from the past 240 hours)  Blood culture (routine x 2)     Status: None (Preliminary result)   Collection Time: 04/06/24  7:11 PM   Specimen: BLOOD  Result Value Ref Range Status   Specimen Description BLOOD LEFT ANTECUBITAL  Final   Special Requests   Final    BOTTLES DRAWN AEROBIC AND ANAEROBIC Blood Culture results may not be optimal due to an inadequate volume of blood received in culture bottles Performed at [facility], [address], [place], [place] [address]    Culture PENDING  Incomplete   Report Status PENDING  Incomplete  Blood culture (routine x 2)     Status: None (Preliminary result)   Collection Time: 04/06/24  7:11 PM   Specimen: BLOOD  Result Value Ref Range Status   Specimen Description BLOOD RIGHT ANTECUBITAL  Final   Special Requests   Final    BOTTLES DRAWN AEROBIC AND ANAEROBIC Blood Culture adequate volume Performed at [facility], [address], [place], [place] [address]    Culture PENDING  Incomplete   Report Status PENDING  Incomplete  Resp panel by RT-PCR (RSV, Flu A&B, Covid) Anterior Nasal Swab     Status: None   Collection Time: 04/06/24 10:03 PM   Specimen: Anterior Nasal Swab  Result Value Ref Range Status   SARS Coronavirus 2  by RT PCR NEGATIVE NEGATIVE Final    Comment: (NOTE) SARS-CoV-2 target nucleic acids are NOT DETECTED.  The SARS-CoV-2 RNA is generally detectable in upper respiratory specimens during the acute phase of infection. The lowest concentration of SARS-CoV-2 viral copies this assay can detect is 138 copies/mL. A negative result does not preclude SARS-Cov-2 infection and should not be used as the sole basis for treatment or other patient management decisions. A negative result may occur with  improper specimen collection/handling, submission of specimen other than nasopharyngeal swab, presence of viral mutation(s) within the areas targeted by this assay, and inadequate number of viral copies(<138 copies/mL). A negative result must be combined with clinical observations, patient history, and epidemiological information. The expected result is Negative.  Fact Sheet for Patients:  BloggerCourse.com  Fact Sheet for Healthcare Providers:  SeriousBroker.it  This test is no t yet approved or cleared by the [place]  FDA and  has been authorized for detection and/or diagnosis of SARS-CoV-2 by FDA under an Emergency Use Authorization (EUA). This EUA will remain  in effect (meaning this test can be used) for the duration of the COVID-19 declaration under Section 564(b)(1) of the Act, 21 U.S.C.section 360bbb-3(b)(1), unless the authorization is terminated  or revoked sooner.       Influenza A by PCR NEGATIVE NEGATIVE Final   Influenza B by PCR NEGATIVE NEGATIVE Final    Comment: (NOTE) The Xpert Xpress SARS-CoV-2/FLU/RSV plus assay is intended as an aid in the diagnosis of influenza from Nasopharyngeal swab specimens and should not be used as  a sole basis for treatment. Nasal washings and aspirates are unacceptable for Xpert Xpress SARS-CoV-2/FLU/RSV testing.  Fact Sheet for Patients: BloggerCourse.com  Fact  Sheet for Healthcare Providers: SeriousBroker.it  This test is not yet approved or cleared by the [place]  FDA and has been authorized for detection and/or diagnosis of SARS-CoV-2 by FDA under an Emergency Use Authorization (EUA). This EUA will remain in effect (meaning this test can be used) for the duration of the COVID-19 declaration under Section 564(b)(1) of the Act, 21 U.S.C. section 360bbb-3(b)(1), unless the authorization is terminated or revoked.     Resp Syncytial Virus by PCR NEGATIVE NEGATIVE Final    Comment: (NOTE) Fact Sheet for Patients: BloggerCourse.com  Fact Sheet for Healthcare Providers: SeriousBroker.it  This test is not yet approved or cleared by the [place]  FDA and has been authorized for detection and/or diagnosis of SARS-CoV-2 by FDA under an Emergency Use Authorization (EUA). This EUA will remain in effect (meaning this test can be used) for the duration of the COVID-19 declaration under Section 564(b)(1) of the Act, 21 U.S.C. section 360bbb-3(b)(1), unless the authorization is terminated or revoked.  Performed at [facility], [address] [place], [place] [address]      Time coordinating discharge: 35 minutes  SIGNED:   Adron JONETTA Fairly, DO [facility] 04/08/2024, 9:51 AM  If 7PM-7AM, please contact night-coverage [email or web address]

## 2024-04-11 LAB — CULTURE, BLOOD (ROUTINE X 2)
Culture: NO GROWTH
Culture: NO GROWTH
Special Requests: ADEQUATE

## 2024-04-11 NOTE — Assessment & Plan Note (Deleted)
 06/19/2022: Screening detected distortion with calcifications in the left breast central 12 o'clock position measuring 1.9 cm.  No ultrasound correlate.  Axilla negative.  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 Followed by adjuvant radiation therapy 09/18/2022-10/14/2022 3. Followed by antiestrogen therapy with tamoxifen  5 years started 10/15/2022 4.  Right lumpectomy 08/10/2023: Grade 2 DCIS 1.7 cm, margins negative, ER 95%, PR 90%   Tamoxifen  toxicities: Tolerating it fairly well without any problems or concerns. She had an interaction with tamoxifen  and trazodone  but she quit trazodone  and her symptoms have improved.   Breast cancer surveillance: Mammograms 06/10/2023: [facility]: 1.4 cm linear calcifications right lower outer quadrant: Stereotactic biopsy: Intermediate grade DCIS ER 95%, PR 90% Right lumpectomy: 08/10/2023: Grade 2 DCIS 1.7 cm, margins negative, ER 95%, PR 90% Bone density 2022: T-score -1.03: Mild osteopenia   Treatment plan:  Adjuvant radiation therapy: completed 10/28/23 Adjuvant antiestrogen therapy with anastrozole  1 mg daily x 5 years  Anastrozole  Toxicities:   Return to clinic in 1 year for follow up

## 2024-04-12 ENCOUNTER — Inpatient Hospital Stay: Payer: Medicare HMO | Admitting: Hematology and Oncology

## 2024-04-12 DIAGNOSIS — D0511 Intraductal carcinoma in situ of right breast: Secondary | ICD-10-CM

## 2024-04-14 DIAGNOSIS — N1831 Chronic kidney disease, stage 3a: Secondary | ICD-10-CM | POA: Diagnosis not present

## 2024-04-14 DIAGNOSIS — A419 Sepsis, unspecified organism: Secondary | ICD-10-CM | POA: Diagnosis not present

## 2024-05-05 ENCOUNTER — Inpatient Hospital Stay: Attending: Hematology and Oncology | Admitting: Hematology and Oncology

## 2024-05-05 VITALS — BP 162/64 | HR 74 | Temp 97.6°F | Resp 16 | Ht 62.0 in | Wt 140.0 lb

## 2024-05-05 DIAGNOSIS — N183 Chronic kidney disease, stage 3 unspecified: Secondary | ICD-10-CM | POA: Diagnosis not present

## 2024-05-05 DIAGNOSIS — M1990 Unspecified osteoarthritis, unspecified site: Secondary | ICD-10-CM | POA: Diagnosis not present

## 2024-05-05 DIAGNOSIS — Z79811 Long term (current) use of aromatase inhibitors: Secondary | ICD-10-CM | POA: Insufficient documentation

## 2024-05-05 DIAGNOSIS — M8580 Other specified disorders of bone density and structure, unspecified site: Secondary | ICD-10-CM | POA: Diagnosis not present

## 2024-05-05 DIAGNOSIS — F411 Generalized anxiety disorder: Secondary | ICD-10-CM | POA: Diagnosis not present

## 2024-05-05 DIAGNOSIS — E782 Mixed hyperlipidemia: Secondary | ICD-10-CM | POA: Diagnosis not present

## 2024-05-05 DIAGNOSIS — D0512 Intraductal carcinoma in situ of left breast: Secondary | ICD-10-CM | POA: Insufficient documentation

## 2024-05-05 NOTE — Progress Notes (Signed)
 Patient Care Team: Auston Opal, DO as PCP - General (Family Medicine) Vernetta Berg, MD as Consulting Physician (General Surgery) Odean Potts, MD as Consulting Physician (Hematology and Oncology) Shannon Agent, MD as Consulting Physician (Radiation Oncology) Glean Stephane BROCKS, RN (Inactive) as Oncology Nurse Navigator Tyree Nanetta SAILOR, RN as Oncology Nurse Navigator  DIAGNOSIS:  Encounter Diagnosis  Name Primary?   Ductal carcinoma in situ (DCIS) of left breast Yes    SUMMARY OF ONCOLOGIC HISTORY: Oncology History  Ductal carcinoma in situ (DCIS) of left breast  06/19/2022 Initial Diagnosis   Screening detected distortion with calcifications in the left breast central 12 o'clock position measuring 1.9 cm.  No ultrasound correlate.  Axilla negative.  Stereotactic biopsy revealed intermediate grade DCIS cribriform and papillary features ER 100%, PR 60%   07/02/2022 Cancer Staging   Staging form: Breast, AJCC 8th Edition - Clinical: Stage 0 (cTis (DCIS), cN0, cM0, G2, ER+, PR+) - Signed by Odean Potts, MD on 07/02/2022 Stage prefix: Initial diagnosis Histologic grading system: 3 grade system    Genetic Testing   [facility] CustomNext Panel was Negative. Report date is 07/11/2022.  The CustomNext-Cancer+RNAinsight panel offered by [facility] includes sequencing and rearrangement analysis for the following 47 genes:  APC, ATM, AXIN2, BARD1, BMPR1A, BRCA1, BRCA2, BRIP1, CDH1, CDK4, CDKN2A, CHEK2, CTNNA1, DICER1, EPCAM, GREM1, HOXB13, KIT, MEN1, MLH1, MSH2, MSH3, MSH6, MUTYH, NBN, NF1, NTHL1, PALB2, PDGFRA, PMS2, POLD1, POLE, PTEN, RAD50, RAD51C, RAD51D, SDHA, SDHB, SDHC, SDHD, SMAD4, SMARCA4, STK11, TP53, TSC1, TSC2, and VHL.  RNA data is routinely analyzed for use in variant interpretation for all genes.   07/16/2022 Surgery   Left lumpectomy: DCIS cribriform and papillary types with focal necrosis grade 3 involving a complex sclerosing lesion, margins focally positive with  superior/posterior margin ER 100%, PR 60%   08/13/2022 Surgery   Reexcision of the margins: Benign   09/17/2022 - 10/14/2022 Radiation Therapy   Site Technique Total Dose (Gy) Dose per Fx (Gy) Completed Fx Beam Energies  Breast, Left: Breast_L 3D 42.72/42.72 2.67 16/16 6XFFF     10/2022 - 07/2023 Anti-estrogen oral therapy   Tamoxifen      08/20/2023 Surgery   Right lumpectomy: Grade 2 DCIS 1.7 cm, margins negative, ER 95%, PR 90%    08/25/2023 - 09/23/2023 Anti-estrogen oral therapy   Anastrozole  1 mg daily   09/23/2023 - 10/28/2023 Radiation Therapy   Plan Name: Breast_R Site: Breast, Right Technique: 3D Mode: Photon Dose Per Fraction: 2.67 Gy Prescribed Dose (Delivered / Prescribed): 40.05 Gy / 40.05 Gy Prescribed Fxs (Delivered / Prescribed): 15 / 15   Plan Name: Breast_R_Bst Site: Breast, Right Technique: 3D Mode: Photon Dose Per Fraction: 2 Gy Prescribed Dose (Delivered / Prescribed): 10 Gy / 10 Gy Prescribed Fxs (Delivered / Prescribed): 5 / 5   10/2023 -  Anti-estrogen oral therapy   2.5 mg Letrozole      CHIEF COMPLIANT: F/U on anastrozole   HISTORY OF PRESENT ILLNESS: History of Present Illness Alicia [name] is an 83 year old female with DCIS who presents with persistent weakness and fatigue.  She experienced a recent hospitalization for pneumonia complicated by sepsis. Despite completing antibiotics, she continues to have significant weakness and fatigue. She recalls a tick bite in [place] two weeks before her illness, which initially caused malaise for ten days before pneumonia developed.  She has been on letrozole  for two years for DCIS, having switched from tamoxifen . She is compliant with the medication and questions if it contributes to her fatigue.  Her mammograms have been stable, with the last one in September showing only small changes.  She is taking iron supplements and is attempting to increase physical activity using a treadmill, though this is  challenging due to her prolonged illness.     ALLERGIES:  is allergic to ibuprofen, mobic [meloxicam], nabumetone, lexapro [escitalopram oxalate], lisinopril, prilosec otc [omeprazole magnesium ], sulfa antibiotics, and zoloft [sertraline hcl].  MEDICATIONS:  Current Outpatient Medications  Medication Sig Dispense Refill   acetaminophen  (TYLENOL ) 650 MG CR tablet Take 1,300 mg by mouth every 8 (eight) hours as needed for pain.     albuterol  (VENTOLIN  HFA) 108 (90 Base) MCG/ACT inhaler Inhale 2 puffs into the lungs every 6 (six) hours as needed for wheezing or shortness of breath. 8 g 2   calcium  carbonate (TUMS - DOSED IN MG ELEMENTAL CALCIUM ) 500 MG chewable tablet Chew 1 tablet by mouth daily as needed for indigestion or heartburn.     Calcium  Magnesium  Zinc  333-133-5 MG TABS Take 1 tablet by mouth daily.     cyanocobalamin  (VITAMIN B12) 1000 MCG tablet Take 1,000 mcg by mouth daily.     docusate sodium  (COLACE) 100 MG capsule Take 1 capsule (100 mg total) by mouth daily. 60 capsule 2   ferrous sulfate  325 (65 FE) MG EC tablet Take 1 tablet (325 mg total) by mouth daily with breakfast. 60 tablet 3   Flax Oil-Fish Oil-Borage Oil (FISH-FLAX-BORAGE PO) Take 1 capsule by mouth daily.     fluticasone  (FLONASE ) 50 MCG/ACT nasal spray Place 1 spray into both nostrils daily. 16 g 0   irbesartan  (AVAPRO ) 300 MG tablet Take 300 mg by mouth daily.     isosorbide  mononitrate (IMDUR ) 30 MG 24 hr tablet Take 1/2 (one-half) tablet by mouth once daily 45 tablet 1   letrozole  (FEMARA ) 2.5 MG tablet Take 1 tablet (2.5 mg total) by mouth daily. 90 tablet 3   LORazepam  (ATIVAN ) 0.5 MG tablet Take 0.5 mg by mouth 2 (two) times daily as needed for anxiety.     metoprolol  succinate (TOPROL  XL) 25 MG 24 hr tablet Take 0.5 tablets (12.5 mg total) by mouth daily. 80 tablet 3   Misc Natural Products (OSTEO BI-FLEX TRIPLE STRENGTH) TABS Take 1 tablet by mouth daily.     Multiple Minerals-Vitamins (CAL MAG ZINC  +D3 PO)  Take 1 tablet by mouth daily.     multivitamin-lutein  (OCUVITE-LUTEIN ) CAPS Take 1 capsule by mouth daily.     omeprazole (PRILOSEC) 40 MG capsule Take 40 mg by mouth daily.     rOPINIRole  (REQUIP ) 1 MG tablet Take 1 tablet (1 mg total) by mouth 3 (three) times daily. 270 tablet 3   simvastatin  (ZOCOR ) 20 MG tablet Take 20 mg by mouth every evening.     No current facility-administered medications for this visit.    PHYSICAL EXAMINATION: ECOG PERFORMANCE STATUS: 1 - Symptomatic but completely ambulatory  Vitals:   05/05/24 1151  BP: (!) 162/64  Pulse: 74  Resp: 16  Temp: 97.6 F (36.4 C)  SpO2: 96%   Filed Weights   05/05/24 1151  Weight: 140 lb (63.5 kg)    Physical Exam   (exam performed in the presence of a chaperone)  LABORATORY DATA:  I have reviewed the data as listed    Latest Ref Rng & Units 04/08/2024    4:18 AM 04/07/2024    4:22 AM 04/06/2024    7:11 PM  CMP  Glucose 70 - 99 mg/dL 95  894  97   BUN 8 - 23 mg/dL 14  17  17    Creatinine 0.44 - 1.00 mg/dL 9.19  9.07  9.13   Sodium 135 - 145 mmol/L 137  135  136   Potassium 3.5 - 5.1 mmol/L 4.1  3.4  3.2   Chloride 98 - 111 mmol/L 104  104  100   CO2 22 - 32 mmol/L 28  25  22    Calcium  8.9 - 10.3 mg/dL 9.1  9.0  9.4     Lab Results  Component Value Date   WBC 4.8 04/08/2024   HGB 10.5 (L) 04/08/2024   HCT 33.4 (L) 04/08/2024   MCV 89.3 04/08/2024   PLT 203 04/08/2024   NEUTROABS 6.0 06/02/2023    ASSESSMENT & PLAN:  Ductal carcinoma in situ (DCIS) of left breast 06/19/2022: Screening detected distortion with calcifications in the left breast central 12 o'clock position measuring 1.9 cm.  No ultrasound correlate.  Axilla negative.  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  07/16/2022 left lumpectomy: DCIS cribriform and papillary types with focal necrosis grade 3 involving a complex sclerosing lesion, margins focally  positive with superior/posterior margin ER 100%, PR 60%, margin reexcision 08/13/2022: Benign 2. Followed by adjuvant radiation therapy 09/18/2022-10/14/2022 3. Followed by antiestrogen therapy with tamoxifen  5 years started 10/15/2022 4.  Right lumpectomy 08/10/2023: Grade 2 DCIS 1.7 cm, margins negative, ER 95%, PR 90%   Tamoxifen  toxicities: Tolerating it fairly well without any problems or concerns. She had an interaction with tamoxifen  and trazodone  but she quit trazodone  and her symptoms have improved.   Breast cancer surveillance: Mammograms 06/10/2023: [facility]: 1.4 cm linear calcifications right lower outer quadrant: Stereotactic biopsy: Intermediate grade DCIS ER 95%, PR 90% Right lumpectomy: 08/10/2023: Grade 2 DCIS 1.7 cm, margins negative, ER 95%, PR 90% Bone density 2022: T-score -1.03: Mild osteopenia   Treatment plan: Adjuvant radiation therapy: 09/24/2023-10/28/2023 Adjuvant antiestrogen therapy with anastrozole  1 mg daily x 5 years started 08/25/2023  Anastrozole  toxicities:   Hospitalization: 04/06/2024-04/08/2024: Community-acquired pneumonia Return to clinic in 1 year for follow-up ------------------------------------- Assessment and Plan Assessment & Plan Ductal carcinoma in situ (DCIS) of left breast, status post letrozole  therapy DCIS treated with letrozole  for two years. Fatigue and weakness possibly related to letrozole . Non-invasive nature and low recurrence risk justify discontinuation to improve quality of life. - Discontinue letrozole  until the end of the year. - Reassess letrozole  need in January based on energy levels and health. - Schedule mammogram for September.  Recent pneumonia with sepsis, resolved Hospitalization for pneumonia with sepsis resolved. Weakness and fatigue likely due to recent illness. Recovery expected to improve.  Iron deficiency, under evaluation Slightly low iron levels. Oral iron supplements ongoing. Further evaluation if levels do not  improve. Consider IV iron infusion if oral supplementation insufficient, pending primary care evaluation. - Continue oral iron supplementation. - Consider IV iron infusion if oral supplementation insufficient, pending primary care evaluation.  Fatigue and weakness, under evaluation Persistent fatigue and weakness potentially multifactorial: recent pneumonia, letrozole  therapy, iron deficiency. Discontinuation of letrozole  may help assess impact. - Discontinue letrozole  to assess impact on fatigue and weakness. - Encourage treadmill use and gradual increase in physical activity as tolerated.      No orders of the defined types were placed in this encounter.  The patient has a good understanding of the overall plan. she agrees with it. she will call with any problems  that may develop before the next visit here. Total time spent: 30 mins including face to face time and time spent for planning, charting and co-ordination of care   Viinay K Kearie Mennen, MD 05/05/24

## 2024-05-05 NOTE — Assessment & Plan Note (Signed)
 06/19/2022: Screening detected distortion with calcifications in the left breast central 12 o'clock position measuring 1.9 cm.  No ultrasound correlate.  Axilla negative.  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 Followed by adjuvant radiation therapy 09/18/2022-10/14/2022 3. Followed by antiestrogen therapy with tamoxifen  5 years started 10/15/2022 4.  Right lumpectomy 08/10/2023: Grade 2 DCIS 1.7 cm, margins negative, ER 95%, PR 90%   Tamoxifen  toxicities: Tolerating it fairly well without any problems or concerns. She had an interaction with tamoxifen  and trazodone  but she quit trazodone  and her symptoms have improved.   Breast cancer surveillance: Mammograms 06/10/2023: [facility]: 1.4 cm linear calcifications right lower outer quadrant: Stereotactic biopsy: Intermediate grade DCIS ER 95%, PR 90% Right lumpectomy: 08/10/2023: Grade 2 DCIS 1.7 cm, margins negative, ER 95%, PR 90% Bone density 2022: T-score -1.03: Mild osteopenia   Treatment plan: Adjuvant radiation therapy: 09/24/2023-10/28/2023 Adjuvant antiestrogen therapy with anastrozole  1 mg daily x 5 years started 08/25/2023  Anastrozole  toxicities:   Hospitalization: 04/06/2024-04/08/2024: Community-acquired pneumonia Return to clinic in 1 year for follow-up

## 2024-05-12 ENCOUNTER — Other Ambulatory Visit: Payer: Self-pay | Admitting: Hematology and Oncology

## 2024-05-12 DIAGNOSIS — D509 Iron deficiency anemia, unspecified: Secondary | ICD-10-CM | POA: Insufficient documentation

## 2024-05-16 ENCOUNTER — Telehealth: Payer: Self-pay

## 2024-05-16 ENCOUNTER — Ambulatory Visit
Admission: RE | Admit: 2024-05-16 | Discharge: 2024-05-16 | Disposition: A | Discharge status: Home / Self Care | Source: Ambulatory Visit | Attending: Physician Assistant | Admitting: Physician Assistant

## 2024-05-16 ENCOUNTER — Other Ambulatory Visit: Payer: Self-pay | Admitting: Hematology and Oncology

## 2024-05-16 ENCOUNTER — Other Ambulatory Visit: Payer: Self-pay | Admitting: Physician Assistant

## 2024-05-16 DIAGNOSIS — J189 Pneumonia, unspecified organism: Secondary | ICD-10-CM | POA: Diagnosis not present

## 2024-05-16 NOTE — Telephone Encounter (Signed)
 Auth Submission: NO AUTH NEEDED Site of care: Site of care: [facility] Payer: [employer] medicare Medication & CPT/J Code(s) submitted: Venofer  (Iron  Sucrose) J1756 Diagnosis Code:  Route of submission (phone, fax, portal): portal Phone # Fax # Auth type: Buy/Bill PB Units/visits requested: 200mg  x 5 doses Reference number:  Approval from: 05/16/24 to 09/15/24

## 2024-05-16 NOTE — Telephone Encounter (Signed)
 Dr. Gudena, patient will be scheduled as soon as possible.  Auth Submission: NO AUTH NEEDED Site of care: Site of care: [facility] Payer: [employer] medicare Medication & CPT/J Code(s) submitted: Venofer  (Iron  Sucrose) J1756 Diagnosis Code:  Route of submission (phone, fax, portal):  Phone # Fax # Auth type: Buy/Bill PB Units/visits requested: 200mg  x 5 doses Reference number:  Approval from: 05/16/24 to 09/15/24

## 2024-05-24 ENCOUNTER — Encounter: Attending: Hematology and Oncology | Admitting: Internal Medicine

## 2024-05-24 VITALS — BP 152/62 | HR 72 | Temp 98.3°F | Resp 14

## 2024-05-24 DIAGNOSIS — D509 Iron deficiency anemia, unspecified: Secondary | ICD-10-CM

## 2024-05-24 MED ORDER — IRON SUCROSE 20 MG/ML IV SOLN
200.0000 mg | Freq: Once | INTRAVENOUS | Status: AC
  Administered 2024-05-24: 200 mg via INTRAVENOUS

## 2024-05-24 MED ORDER — ACETAMINOPHEN 325 MG PO TABS
650.0000 mg | ORAL_TABLET | Freq: Once | ORAL | Status: AC
  Administered 2024-05-24: 650 mg via ORAL

## 2024-05-24 MED ORDER — DIPHENHYDRAMINE HCL 25 MG PO CAPS
25.0000 mg | ORAL_CAPSULE | Freq: Once | ORAL | Status: AC
  Administered 2024-05-24: 25 mg via ORAL

## 2024-05-24 NOTE — Progress Notes (Signed)
 Diagnosis: Iron  Deficiency Anemia  Provider:  Gudena,Vinay,MD  Procedure: IV Infusion  IV Type: Peripheral, IV Location: R Antecubital  Venofer  (Iron  Sucrose), Dose: 200 mg  Infusion Start Time: 1010  Infusion Stop Time: 1020  Post Infusion IV Care: Observation period completed  Discharge: Condition: Good, Destination: Home . AVS Provided  Performed by:  Blanca Selinda SAUNDERS, LPN

## 2024-05-26 ENCOUNTER — Encounter (INDEPENDENT_AMBULATORY_CARE_PROVIDER_SITE_OTHER): Admitting: *Deleted

## 2024-05-26 VITALS — BP 127/74 | HR 68 | Temp 97.6°F | Resp 16

## 2024-05-26 DIAGNOSIS — D509 Iron deficiency anemia, unspecified: Secondary | ICD-10-CM

## 2024-05-26 MED ORDER — IRON SUCROSE 20 MG/ML IV SOLN
200.0000 mg | Freq: Once | INTRAVENOUS | Status: AC
  Administered 2024-05-26: 200 mg via INTRAVENOUS

## 2024-05-26 MED ORDER — DIPHENHYDRAMINE HCL 25 MG PO CAPS
25.0000 mg | ORAL_CAPSULE | Freq: Once | ORAL | Status: AC
  Administered 2024-05-26: 25 mg via ORAL

## 2024-05-26 MED ORDER — ACETAMINOPHEN 325 MG PO TABS
650.0000 mg | ORAL_TABLET | Freq: Once | ORAL | Status: AC
  Administered 2024-05-26: 650 mg via ORAL

## 2024-05-26 NOTE — Progress Notes (Signed)
 Diagnosis: Iron  Deficiency Anemia  Provider:  Vinay Gudena, MD  Procedure: IV Push  IV Type: Peripheral, IV Location: L Antecubital  Venofer  (Iron  Sucrose), Dose: 200 mg  Post Infusion IV Care: Observation period completed  Discharge: Condition: Good, Destination: Home . AVS Provided  Performed by:  Baldwin Darice Helling, RN

## 2024-05-29 NOTE — Progress Notes (Unsigned)
 Cardiology Clinic Note   Patient Name: Alicia [name] Date of Encounter: 05/31/2024  Primary Care Provider:  Auston Opal, DO Primary Cardiologist:  None  Patient Profile    Alicia [name] 83 year old female presents the clinic today for follow-up evaluation of her SVT noted on cardiac event monitor.  Past Medical History    Past Medical History:  Diagnosis Date   Anginal pain (HCC)    Anxiety 2014   Bursitis    Cancer (HCC) 06/2022   left breast DCIS   Dyspnea    with exertion   GERD (gastroesophageal reflux disease) 2008   History of radiation therapy    Left breast- 09/17/22-10/14/22- Dr. Lynwood Nasuti   History of radiation therapy    Right breast-09/23/23-10/28/23- Dr. Lynwood Nasuti   Hypercholesteremia 2003   Hypertension 2003   Insomnia 1988   Kidney stone    Osteoarthritis 2008   hands, knees, right hip   Osteopenia 2002   Restless leg syndrome 2000   RLS (restless legs syndrome) 03/28/2016   Vertigo 2002   Past Surgical History:  Procedure Laterality Date   ABDOMINAL HYSTERECTOMY     APPENDECTOMY     BLEPHAROPLASTY  2019   BREAST LUMPECTOMY WITH RADIOACTIVE SEED LOCALIZATION Left 07/16/2022   Procedure: LEFT BREAST LUMPECTOMY WITH RADIOACTIVE SEED LOCALIZATION;  Surgeon: Vernetta Berg, MD;  Location: [facility];  Service: General;  Laterality: Left;   BREAST LUMPECTOMY WITH RADIOACTIVE SEED LOCALIZATION Right 08/11/2023   Procedure: RADIOACTIVE SEED GUIDED RIGHT BREAST LUMPECTOMY;  Surgeon: Vernetta Berg, MD;  Location: [facility] OR;  Service: General;  Laterality: Right;  LMA   CATARACT EXTRACTION Right 02/2010   CHOLECYSTECTOMY     COLONOSCOPY  2015   results normal   LEFT HEART CATH AND CORONARY ANGIOGRAPHY N/A 09/20/2020   Procedure: LEFT HEART CATH AND CORONARY ANGIOGRAPHY;  Surgeon: Court Dorn PARAS, MD;  Location: [facility];  Service: Cardiovascular;  Laterality: N/A;   RE-EXCISION OF BREAST LUMPECTOMY Left 08/13/2022    Procedure: RE-EXCISION OF LEFT BREAST CANCER;  Surgeon: Vernetta Berg, MD;  Location: [facility];  Service: General;  Laterality: Left;   ROTATOR CUFF REPAIR Right     Allergies  Allergies  Allergen Reactions   Ibuprofen Other (See Comments)    GI Upset   Mobic [Meloxicam] Other (See Comments)    Leg pain   Nabumetone Other (See Comments)    GI Upset   Lexapro [Escitalopram Oxalate] Other (See Comments)    GERD   Lisinopril Cough   Prilosec Otc [Omeprazole Magnesium ] Other (See Comments)    Arm spasm   Sulfa Antibiotics Dermatitis and Rash   Zoloft [Sertraline Hcl] Other (See Comments)    Insomnia     History of Present Illness    Alicia [name] has a PMH of HTN, diverticulosis, osteopenia, RLS, HLD, anxiety, chest pain of uncertain etiology, and breast CA.  She has a 15-20 pack year of tobacco abuse.  She stopped smoking around 25 years ago.  Her parents both died of MI.  She was initially referred to cardiology for evaluation of her chest discomfort.  She noted exertional chest discomfort.  She underwent left heart cath on 09/20/2020 by Dr. Court.  Her catheterization was normal.  It was felt that her symptoms were related to reflux.  She was seen in follow-up by Dr. Court on 12/03/2021.  During that time she continued to do well.  Her chest pain had improved  with treatment of her reflux.  Her blood pressure was well-controlled..  Her cholesterol was also well-managed.  She presented to the clinic 08/05/23 for follow-up evaluation and preoperative cardiac evaluation.  She stated she had been noticing intermittent episodes of fast heart rate.  She reported episodes that were brief.  She did note that her heart rate would go up to 120-130 then returned to normal.  She reported that when she had been admitted to the hospital she had been told that she was dehydrated.  She reported adequate hydration at the time.  Case was reviewed with DOD.  I will order a 3-day  cardiac event monitor, CBC, CMP, TSH, magnesium  and asked her avoid triggers for palpitations.  She was allowed to proceed to surgery and follow-up in 6 weeks was planned.   Her cardiac event monitor showed sinus rhythm, ST, occasional PACs and PVCs and runs of SVT.  I started her on metoprolol  tartrate 12.5 mg twice daily.  Her amlodipine was discontinued.  She presented to the clinic 09/16/23 for follow-up evaluation and stated her palpitations were much better controlled with metoprolol .  However, she did note that when she split the tartrate the medication did crumble.  She was using a pill splitter.  She requested to take the medication once per day.  I  stopped her metoprolol  tartrate and place her on metoprolol  succinate 12.5 mg daily.  She had been trying to hydrate well.  We reviewed vagal maneuvers.  She expressed understanding.  She tolerated her lumpectomy well.  She planned to  start radiation and had 20 treatments planned.   Follow-up in 6 months was planned.   On 04/06/24 she was admitted to the hospital with sepsis due to pneumonia.  She received antibiotics and an inhaler to be used as needed.  She was instructed to follow-up with her PCP in 1 to 2 weeks.  She presents to the clinic today for follow-up evaluation and states she continues to recover from her pneumonia.  She reports that she has a follow-up appointment with her PCP on Thursday.  She has been monitoring her heart rate.  She notes that before she takes her medication her heart rate occasionally climbs into the 120s-low 100s.  She notes that this morning her pulse was about 111.  Today with her EKG her pulses 88.  We reviewed previous cardiac event monitor and history of SVT.  I advised/educated on vagal maneuvers.  She expressed understanding.  I encouraged her to avoid triggers for palpitations and she expressed understanding.  She has been walking on her treadmill 30-60 minutes daily.  I will refill her medications and plan  follow-up in 6 months.  Today she denies chest pain, shortness of breath, lower extremity edema, fatigue,  melena, hematuria, hemoptysis, diaphoresis, weakness, presyncope, syncope, orthopnea, and PND.      Home Medications    Prior to Admission medications   Medication Sig Start Date End Date Taking? Authorizing Provider  acetaminophen  (TYLENOL ) 650 MG CR tablet Take 1,300 mg by mouth every 8 (eight) hours as needed for pain.    [provider]  amLODipine (NORVASC) 2.5 MG tablet Take 2.5 mg by mouth daily. 09/08/22   [provider]  Bioflavonoid Products (BIOFLEX PO) Take 1 tablet by mouth daily.    [provider]  Flax Oil-Fish Oil-Borage Oil (FISH-FLAX-BORAGE PO) Take 1 capsule by mouth daily.    [provider]  fluticasone  (FLONASE ) 50 MCG/ACT nasal spray Place 1 spray into  both nostrils daily. Patient taking differently: Place 1 spray into both nostrils daily as needed for allergies. 07/11/21   Blaise Aleene KIDD, MD  irbesartan  (AVAPRO ) 300 MG tablet Take 300 mg by mouth daily. 10/11/19   [provider]  isosorbide  mononitrate (IMDUR ) 30 MG 24 hr tablet Take 1/2 (one-half) tablet by mouth once daily 04/07/23   Court Dorn PARAS, MD  LORazepam  (ATIVAN ) 0.5 MG tablet Take 0.5 mg by mouth 2 (two) times daily as needed for anxiety. 02/11/16   [provider]  meclizine (ANTIVERT) 25 MG tablet Take 25 mg by mouth 3 (three) times daily as needed for dizziness. 04/27/20   [provider]  Multiple Minerals-Vitamins (CAL MAG ZINC  +D3 PO) Take 1 tablet by mouth daily.    [provider]  multivitamin-lutein  (OCUVITE-LUTEIN ) CAPS Take 830 capsules by mouth daily.    [provider]  omeprazole (PRILOSEC) 40 MG capsule Take 40 mg by mouth daily. 02/06/16   [provider]  rOPINIRole  (REQUIP ) 1 MG tablet Take 1 tablet (1 mg total) by mouth 3 (three) times daily. Patient taking differently: Take 1-2 mg by mouth See  admin instructions. Take 1 mg by mouth in the morning & take 2 mg at night. 10/20/18   Patel, Donika K, DO  simvastatin  (ZOCOR ) 20 MG tablet Take 20 mg by mouth every evening. 02/06/16   [provider]    Family History    Family History  Problem Relation Age of Onset   Heart attack Mother    Depression Mother    Stroke Mother    Diabetes Mother    Heart attack Father    COPD Sister    COPD Sister    COPD Brother    COPD Brother    Cancer Maternal Aunt    Breast cancer Maternal Aunt 60   Restless legs syndrome Daughter    Restless legs syndrome Son    Restless legs syndrome Son    She indicated that her mother is deceased. She indicated that her father is deceased. She indicated that all of her three sisters are deceased. She indicated that all of her three brothers are deceased. She indicated that her daughter is alive. She indicated that both of her sons are alive. She indicated that the status of her maternal aunt is unknown.  Social History    Social History   Socioeconomic History   Marital status: Widowed    Spouse name: Not on file   Number of children: Not on file   Years of education: Not on file   Highest education level: Not on file  Occupational History   Not on file  Tobacco Use   Smoking status: Former    Current packs/day: 0.00    Average packs/day: 0.5 packs/day for 20.0 years (10.0 ttl pk-yrs)    Types: Cigarettes    Start date: 10/06/1976    Quit date: 10/06/1996    Years since quitting: 27.6    Passive exposure: Past   Smokeless tobacco: Never  Vaping Use   Vaping status: Never Used  Substance and Sexual Activity   Alcohol use: No    Alcohol/week: 0.0 standard drinks of alcohol   Drug use: No   Sexual activity: Not Currently    Birth control/protection: Surgical    Comment: hyst  Other Topics Concern   Not on file  Social History Narrative   Lives alone in a one story home.  Has 3 children.     Semi  retired.     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  Education: high school.   Social Drivers of Corporate investment banker Strain: Not on file  Food Insecurity: No Food Insecurity (04/07/2024)   Hunger Vital Sign    Worried About Running Out of Food in the Last Year: Never true    Ran Out of Food in the Last Year: Never true  Transportation Needs: No Transportation Needs (04/07/2024)   PRAPARE - Administrator, Civil Service (Medical): No    Lack of Transportation (Non-Medical): No  Physical Activity: Not on file  Stress: Not on file  Social Connections: Unknown (04/07/2024)   Social Connection and Isolation Panel    Frequency of Communication with Friends and Family: More than three times a week    Frequency of Social Gatherings with Friends and Family: More than three times a week    Attends Religious Services: More than 4 times per year    Active Member of [employer] or Organizations: Yes    Attends Banker Meetings: 1 to 4 times per year    Marital Status: Patient unable to answer  Intimate Partner Violence: Not At Risk (04/07/2024)   Humiliation, Afraid, Rape, and Kick questionnaire    Fear of Current or Ex-Partner: No    Emotionally Abused: No    Physically Abused: No    Sexually Abused: No     Review of Systems    General:  No chills, fever, night sweats or weight changes.  Cardiovascular:  No chest pain, dyspnea on exertion, edema, orthopnea, palpitations, paroxysmal nocturnal dyspnea. Dermatological: No rash, lesions/masses Respiratory: No cough, dyspnea Urologic: No hematuria, dysuria Abdominal:   No nausea, vomiting, diarrhea, bright red blood per rectum, melena, or hematemesis Neurologic:  No visual changes, wkns, changes in mental status. All other systems reviewed and are otherwise negative except as noted above.  Physical Exam    VS:  BP 123/77 (BP Location: Left Arm, Patient Position: Sitting)   Pulse 88   Ht 5' 1 (1.549 m)   Wt 133 lb 6.4 oz (60.5 kg)   SpO2 95%   BMI 25.21 kg/m   , BMI Body mass index is 25.21 kg/m. GEN: Well nourished, well developed, in no acute distress. HEENT: normal. Neck: Supple, no JVD, carotid bruits, or masses. Cardiac: RRR, no murmurs, rubs, or gallops. No clubbing, cyanosis, edema.  Radials/DP/PT 2+ and equal bilaterally.  Respiratory:  Respirations regular and unlabored, clear to auscultation bilaterally. GI: Soft, nontender, nondistended, BS + x 4. MS: no deformity or atrophy. Skin: warm and dry, no rash. Neuro:  Strength and sensation are intact. Psych: Normal affect.  Accessory Clinical Findings    Recent Labs: 08/05/2023: ALT 11; TSH 1.130 04/08/2024: BUN 14; Creatinine, Ser 0.80; Hemoglobin 10.5; Magnesium  1.8; Platelets 203; Potassium 4.1; Sodium 137   Recent Lipid Panel No results found for: CHOL, TRIG, HDL, CHOLHDL, VLDL, LDLCALC, LDLDIRECT       ECG personally reviewed by me today-   EKG Interpretation Date/Time:  Tuesday May 31 2024 08:57:18 EDT Ventricular Rate:  88 PR Interval:  118 QRS Duration:  114 QT Interval:  388 QTC Calculation: 469 R Axis:   -3  Text Interpretation: Normal sinus rhythm Right bundle branch block Minimal voltage criteria for LVH, may be normal variant ( R in aVL ) Cannot rule out Anterior infarct , age undetermined When compared with ECG of 06-Apr-2024 18:56, PREVIOUS ECG IS PRESENT Confirmed by Emelia Hazy [phone])  on 05/31/2024 9:04:08 AM    Cardiac catheterization 09/20/2020  The left ventricular systolic function is normal. LV end diastolic pressure is normal. The left ventricular ejection fraction is 55-65% by visual estimate.   Alicia [name] is a 83 y.o. female      [id number] LOCATION:  FACILITY: [facility]  PHYSICIAN: Dorn Lesches, M.D. [birth year]     DATE OF PROCEDURE:  09/20/2020   DATE OF DISCHARGE:        CARDIAC CATHETERIZATION        History obtained from chart review.Alicia [name] is a 83 y.o. 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  She was referred by Dr. Merilee, her primary provider, for evaluation of exertional chest pain.  Her risk factor profile is notable for 15 to 20 pack years of tobacco abuse having quit 25 years ago, treated hypertension and hyperlipidemia.  Both of her parents died of myocardial infarction's.  She is never had a heart attack or stroke.  She does have RLS and GERD.  She is complained of new onset exertional chest pain for last 3 to 4 months that occurs with walking associated shortness of breath and upper extremity radiation.  She presents today for outpatient diagnostic coronary angiography to define her anatomy and physiology rule out an ischemic etiology.     IMPRESSION: Ms Alicia [name] has normal coronary arteries and normal LV function.  I believe her chest pain and other symptoms are noncardiac.  The sheath was removed and a TR band was placed on the right wrist to achieve patent hemostasis.  The patient left the lab in stable condition.  She will be discharged home today as an outpatient and will see back in the office in several weeks for follow-up.   Dorn Lesches. MD, [facility] 09/20/2020 1:27 PM   Patient had a min HR of 62 bpm, max HR of 210 bpm, and avg HR of 93 bpm. Predominant underlying rhythm was Sinus Rhythm. Bundle Branch Block/IVCD was present. 17 Supraventricular Tachycardia runs occurred, the run with the fastest interval lasting 4  beats with a max rate of 210 bpm, the longest lasting 25.1 secs with an avg rate of 134 bpm. Isolated SVEs were rare (<1.0%), SVE Couplets were rare (<1.0%), and SVE Triplets were rare (<1.0%). Isolated VEs were rare (<1.0%), and no VE Couplets or VE  Triplets were present.   SR/ST Occasional PACs/PVCs Runs of SVT (17), longest lasted 25 sec   Assessment & Plan   1.  Palpitations-heart rate today 88.  Stable.  Prior  cardiac event  monitor showed SR, ST, occasional PACs and PVCs, and runs of SVT.  Tolerating metoprolol  succinate well. Avoid triggers caffeine, chocolate, EtOH, dehydration etc. Continue metoprolol  succinate Continue sports drink, maintain p.o. hydration  Essential hypertension-BP today 123/77 Maintain blood pressure log Heart healthy low-sodium diet  Chest discomfort-no chest pain today.  Denies exertional chest discomfort.  Had reassuring cardiac catheterization 12/21. Heart healthy low-sodium diet-reviewed Continue current medical therapy Maintain physical activity  Hyperlipidemia-LDL 75 on 10/27/23 High-fiber diet Continue with his simvastatin  Follows with PCP     Disposition: Follow-up with Dr. Lesches or me in  6 months.  Alicia [name]. Kambra Beachem NP-C     05/31/2024, 9:00 AM [facility] [address] Office [phone] Fax [phone]    I spent 14 minutes examining this patient, reviewing  medications, and using patient centered shared decision making involving her cardiac care.   I spent greater than 20 minutes reviewing her past medical history,  medications, and prior cardiac tests.

## 2024-05-30 ENCOUNTER — Encounter: Admitting: *Deleted

## 2024-05-30 VITALS — BP 111/72 | HR 80 | Temp 97.8°F | Resp 16

## 2024-05-30 DIAGNOSIS — D509 Iron deficiency anemia, unspecified: Secondary | ICD-10-CM

## 2024-05-30 MED ORDER — DIPHENHYDRAMINE HCL 25 MG PO CAPS
25.0000 mg | ORAL_CAPSULE | Freq: Once | ORAL | Status: AC
  Administered 2024-05-30: 25 mg via ORAL

## 2024-05-30 MED ORDER — IRON SUCROSE 20 MG/ML IV SOLN
200.0000 mg | Freq: Once | INTRAVENOUS | Status: AC
  Administered 2024-05-30: 200 mg via INTRAVENOUS

## 2024-05-30 MED ORDER — ACETAMINOPHEN 325 MG PO TABS
650.0000 mg | ORAL_TABLET | Freq: Once | ORAL | Status: AC
  Administered 2024-05-30: 650 mg via ORAL

## 2024-05-30 NOTE — Progress Notes (Signed)
 Diagnosis: Iron  Deficiency Anemia  Provider:  Vinay Gudena, MD  Procedure: IV Push  IV Type: Peripheral, IV Location: R Antecubital  Venofer  (Iron  Sucrose), Dose: 200 mg  Post Infusion IV Care: Observation period completed  Discharge: Condition: Good, Destination: Home . AVS Provided  Performed by:  Baldwin Darice Helling, RN

## 2024-05-31 ENCOUNTER — Encounter: Payer: Self-pay | Admitting: General Practice

## 2024-05-31 ENCOUNTER — Ambulatory Visit: Attending: General Practice | Admitting: General Practice

## 2024-05-31 VITALS — BP 123/77 | HR 88 | Ht 61.0 in | Wt 133.4 lb

## 2024-05-31 DIAGNOSIS — E785 Hyperlipidemia, unspecified: Secondary | ICD-10-CM | POA: Diagnosis not present

## 2024-05-31 DIAGNOSIS — R079 Chest pain, unspecified: Secondary | ICD-10-CM

## 2024-05-31 DIAGNOSIS — R002 Palpitations: Secondary | ICD-10-CM

## 2024-05-31 DIAGNOSIS — I10 Essential (primary) hypertension: Secondary | ICD-10-CM

## 2024-05-31 MED ORDER — SIMVASTATIN 20 MG PO TABS: 20.0000 mg | ORAL_TABLET | Freq: Every evening | ORAL | 11 refills | Status: AC

## 2024-05-31 MED ORDER — ISOSORBIDE MONONITRATE ER 30 MG PO TB24: 15.0000 mg | ORAL_TABLET | Freq: Every day | ORAL | 3 refills | Status: AC

## 2024-05-31 MED ORDER — IRBESARTAN 300 MG PO TABS: 300.0000 mg | ORAL_TABLET | Freq: Every day | ORAL | 11 refills | Status: AC

## 2024-05-31 NOTE — Patient Instructions (Addendum)
 Medication Instructions:  Your physician recommends that you continue on your current medications as directed. Please refer to the Current Medication list given to you today.  *If you need a refill on your cardiac medications before your next appointment, please call your pharmacy*  Lab Work: NONE If you have labs (blood work) drawn today and your tests are completely normal, you will receive your results only by: MyChart Message (if you have MyChart) OR A paper copy in the mail If you have any lab test that is abnormal or we need to change your treatment, we will call you to review the results.  Testing/Procedures: NONE  Follow-Up: At [facility], you and your health needs are our priority.  As part of our continuing mission to provide you with exceptional heart care, our providers are all part of one team.  This team includes your primary Cardiologist (physician) and Advanced Practice Providers or APPs (Physician Assistants and Nurse Practitioners) who all work together to provide you with the care you need, when you need it.  Your next appointment:   6 month(s)  Provider:   JOSEFA BEAUVAIS, PA-C  We recommend signing up for the patient portal called MyChart.  Sign up information is provided on this After Visit Summary.  MyChart is used to connect with patients for Virtual Visits (Telemedicine).  Patients are able to view lab/test results, encounter notes, upcoming appointments, etc.  Non-urgent messages can be sent to your provider as well.   To learn more about what you can do with MyChart, go to [email or web address].   Other Instructions Avoid Triggers of A-Fib Do not smoke or use any products that contain nicotine or tobacco. If you need help quitting, ask your doctor. Eat heart-healthy foods. Talk with your doctor about the right eating plan for you. Exercise regularly as told by your doctor. Do not drink alcohol. Lose weight if you are overweight. Do not use diet  pills unless your doctor says they are safe for you. Diet pills may make heart problems worse. Keep all follow-up visits. Your doctor will check your heart rate and rhythm regularly. Increase your water intake: Maintain hydration  Avoid caffeine  Avoid excess chocolate    Exercise regularly as told by your doctor. Make sure to weight daily and keep a weight log.   Moderate-intensity exercise is any activity that gets you moving enough to burn at least three times more energy (calories) than if you were sitting. Examples of moderate exercise include: Walking a mile in 15 minutes. Doing light yard work. Biking at an easy pace. Most people should get at least 150 minutes of moderate-intensity exercise a week to maintain their body weight.  Increase your water intake: Maintain hydration    Supraventricular Tachycardia, Adult Supraventricular tachycardia (SVT) is a kind of abnormal heartbeat. It makes your heart beat very fast. This may last for just a short time. Or it may last longer and need treatment to get the heartbeat to go back to normal. A normal resting heartbeat is 60-100 times a minute. SVT can make your heart beat more than 150 times a minute.  The times when you have a fast heartbeat--or episodes of SVT--can be scary. But they're usually not dangerous. In some cases, SVT can lead to other heart problems. What are the causes?  SVT happens when electrical signals are sent out from areas of the upper part of the heart that don't normally send heartbeat signals. What increases the risk? You are more likely  to get SVT if you are: Middle aged or older. Female. Other things that may increase your risk include: Having stress or feeling worried or nervous. Using illegal drugs such as cocaine or methamphetamine. Using over-the-counter cough or cold medicines. Stimulant drugs, such as caffeine. Smoking or alcohol use. Having any of these conditions: A thyroid   condition. Diabetes. Obstructive sleep apnea. What are the signs or symptoms? A pounding heartbeat. Feeling fast or irregular heartbeats called palpitations. Shortness of breath. Weakness and tiredness. Tightness or pain in your chest. Feeling light-headed or dizzy. Feeling worried or nervous. Sometimes there are no symptoms. How is this diagnosed? This condition may be diagnosed based on: Your symptoms and a physical exam. Your health care provider will listen to your heart and feel your pulse. Tests. These may include: An electrocardiogram (ECG). This test checks for problems with electrical activity in the heart. A Holter monitor or event monitor test. For this test, you'll wear a device that monitors your heart rate over time. An echocardiogram. This test uses sound waves to make a picture of your heart. A stress echocardiogram. An echocardiogram is done when you are at rest and after exercise. Blood tests. An electrophysiology study (EPS). This tests the electrical activity in your heart. It helps find where the abnormal heart rhythm is coming from. How is this treated? Treatment may include: Vagal nerve stimulation. Doing things to stimulate a nerve called the vagus nerve can slow down the heartbeat. Work with your provider to find which technique works best for you. Ways to do this include: Holding your breath and pushing, as though you are pooping. Cough hard  Medicines that prevent attacks. Medicine to stop an attack. This is given through an IV at the hospital. Cardioversion. This uses a small electric shock to stop an attack. Catheter ablation. This is a procedure to get rid of cells in the area that's causing the fast heartbeats. If you don't have symptoms, you may not need treatment. Follow these instructions at home: Stress Avoid things that make you feel stressed. Find healthy ways to deal with stress, such as: Doing yoga or meditation. Going out in  nature. Listening to relaxing music. Taking steps to be healthy, such as getting lots of sleep, exercising, and eating a balanced diet. Talking with a mental health provider. Lifestyle Try to get at least 7 hours of sleep each night. Do not smoke, vape, or use products with nicotine or tobacco in them. If you need help quitting, talk with your provider. Do not use illegal drugs. If you need help quitting, talk with your provider. Do not drink alcohol if it gives you a fast heartbeat. If alcohol does not seem to give you a fast heartbeat, limit your alcohol use. If you drink alcohol: Limit how much you have to: 0-1 drink a day if you are female. 0-2 drinks a day if you are female. Know how much alcohol is in your drink. In the [place] one drink is one 12 oz bottle of beer (355 mL), one 5 oz glass of wine (148 mL), or one 1 oz glass of hard liquor (44 mL). Be aware of how caffeine affects you. If caffeine gives you a fast heartbeat, do not eat, drink, or use anything with caffeine in it. If caffeine doesn't seem to give you a fast heartbeat, limit how much caffeine you have. General instructions Stay at a healthy weight. Exercise often. Ask your provider about good activities for you. Try one or a mixture  of these: 150 minutes a week of gentle exercise, like walking or yoga. 75 minutes a week of exercise that is very active, like running or swimming. Do vagal nerve stimulation only as told by your provider. Take medicines only as told by your provider. Keep all follow-up visits. Your provider will want to make sure your treatments are working. Contact a health care provider if: You have a fast heartbeat more often. The feeling that your heart is beating fast lasts longer than before. Home treatments to slow down your heartbeat do not help. You have new symptoms. Get help right away if: You have chest pain. Your heart beats very fast for more than 20 minutes. You have trouble  breathing. You faint. Your symptoms get worse. These symptoms may be an emergency. Call 911 right away. Do not wait to see if the symptoms will go away. Do not drive yourself to the hospital. This information is not intended to replace advice given to you by your health care provider. Make sure you discuss any questions you have with your health care provider. Document Revised: 06/25/2023 Document Reviewed: 11/11/2022 Elsevier Patient Education  2024 [employer].

## 2024-06-02 DIAGNOSIS — I10 Essential (primary) hypertension: Secondary | ICD-10-CM | POA: Diagnosis not present

## 2024-06-02 DIAGNOSIS — Z862 Personal history of diseases of the blood and blood-forming organs and certain disorders involving the immune mechanism: Secondary | ICD-10-CM | POA: Diagnosis not present

## 2024-06-02 DIAGNOSIS — E782 Mixed hyperlipidemia: Secondary | ICD-10-CM | POA: Diagnosis not present

## 2024-06-03 ENCOUNTER — Encounter (INDEPENDENT_AMBULATORY_CARE_PROVIDER_SITE_OTHER): Admitting: *Deleted

## 2024-06-03 ENCOUNTER — Encounter: Payer: Self-pay | Admitting: Hematology and Oncology

## 2024-06-03 VITALS — BP 149/67 | HR 66 | Temp 97.5°F | Resp 16

## 2024-06-03 DIAGNOSIS — D509 Iron deficiency anemia, unspecified: Secondary | ICD-10-CM

## 2024-06-03 MED ORDER — ACETAMINOPHEN 325 MG PO TABS
650.0000 mg | ORAL_TABLET | Freq: Once | ORAL | Status: AC
  Administered 2024-06-03: 650 mg via ORAL

## 2024-06-03 MED ORDER — DIPHENHYDRAMINE HCL 25 MG PO CAPS
25.0000 mg | ORAL_CAPSULE | Freq: Once | ORAL | Status: AC
  Administered 2024-06-03: 25 mg via ORAL

## 2024-06-03 MED ORDER — IRON SUCROSE 20 MG/ML IV SOLN
200.0000 mg | Freq: Once | INTRAVENOUS | Status: AC
  Administered 2024-06-03: 200 mg via INTRAVENOUS

## 2024-06-03 NOTE — Progress Notes (Signed)
 Diagnosis: Iron  Deficiency Anemia  Provider:  Mackey Chad, MD  Procedure: IV Push  IV Type: Peripheral, IV Location: R Antecubital  Venofer  (Iron  Sucrose), Dose: 200 mg  Post Infusion IV Care: Observation period completed  Discharge: Condition: Good, Destination: Home . AVS Provided  Performed by:  Baldwin Darice Helling, RN

## 2024-06-05 DIAGNOSIS — M1990 Unspecified osteoarthritis, unspecified site: Secondary | ICD-10-CM | POA: Diagnosis not present

## 2024-06-05 DIAGNOSIS — F411 Generalized anxiety disorder: Secondary | ICD-10-CM | POA: Diagnosis not present

## 2024-06-05 DIAGNOSIS — N183 Chronic kidney disease, stage 3 unspecified: Secondary | ICD-10-CM | POA: Diagnosis not present

## 2024-06-05 DIAGNOSIS — E782 Mixed hyperlipidemia: Secondary | ICD-10-CM | POA: Diagnosis not present

## 2024-06-09 ENCOUNTER — Encounter: Attending: Hematology and Oncology | Admitting: Emergency Medicine

## 2024-06-09 VITALS — BP 131/63 | HR 63 | Temp 97.4°F | Resp 16

## 2024-06-09 DIAGNOSIS — D509 Iron deficiency anemia, unspecified: Secondary | ICD-10-CM | POA: Diagnosis not present

## 2024-06-09 MED ORDER — DIPHENHYDRAMINE HCL 25 MG PO CAPS
25.0000 mg | ORAL_CAPSULE | Freq: Once | ORAL | Status: AC
  Administered 2024-06-09: 25 mg via ORAL

## 2024-06-09 MED ORDER — IRON SUCROSE 20 MG/ML IV SOLN
200.0000 mg | Freq: Once | INTRAVENOUS | Status: AC
  Administered 2024-06-09: 200 mg via INTRAVENOUS

## 2024-06-09 MED ORDER — ACETAMINOPHEN 325 MG PO TABS
650.0000 mg | ORAL_TABLET | Freq: Once | ORAL | Status: AC
  Administered 2024-06-09: 650 mg via ORAL

## 2024-06-09 NOTE — Progress Notes (Signed)
 Diagnosis: Iron  Deficiency Anemia  Provider:  Mackey Chad MD  Procedure: IV Push  IV Type: Peripheral, IV Location: R Antecubital  Venofer  (Iron  Sucrose), Dose: 200 mg  Post Infusion IV Care: Observation period completed and Peripheral IV Discontinued  Discharge: Condition: Good, Destination: Home . AVS Provided  Performed by:  Delon ONEIDA Officer, RN

## 2024-06-13 DIAGNOSIS — H52223 Regular astigmatism, bilateral: Secondary | ICD-10-CM | POA: Diagnosis not present

## 2024-06-23 NOTE — Progress Notes (Deleted)
 Office Visit Note  Patient: Alicia [name]             Date of Birth: [birth year]           MRN: [id number]             PCP: Auston Opal, DO Referring: Auston Opal, DO Visit Date: 07/07/2024 Occupation: Data Unavailable  Subjective:  No chief complaint on file.   History of Present Illness: Alicia [name] is a 83 y.o. female ***     Activities of Daily Living:  Patient reports morning stiffness for *** {minute/hour:19697}.   Patient {ACTIONS;DENIES/REPORTS:21021675::Denies} nocturnal pain.  Difficulty dressing/grooming: {ACTIONS;DENIES/REPORTS:21021675::Denies} Difficulty climbing stairs: {ACTIONS;DENIES/REPORTS:21021675::Denies} Difficulty getting out of chair: {ACTIONS;DENIES/REPORTS:21021675::Denies} Difficulty using hands for taps, buttons, cutlery, and/or writing: {ACTIONS;DENIES/REPORTS:21021675::Denies}  No Rheumatology ROS completed.   PMFS History:  Patient Active Problem List   Diagnosis Date Noted   Iron  deficiency anemia 05/12/2024   Restless leg syndrome 04/07/2024   GERD without esophagitis 04/07/2024   Sepsis due to pneumonia (HCC) 04/06/2024   Ductal carcinoma in situ (DCIS) of right breast 09/09/2023   Genetic testing 07/14/2022   Ductal carcinoma in situ (DCIS) of left breast 06/30/2022   Primary osteoarthritis of left hip 07/02/2021   Family history of heart disease 09/12/2020   Chest pain of uncertain etiology 09/12/2020   Essential hypertension 10/12/2018   Dyslipidemia 10/12/2018   History of kidney stones 10/12/2018   History of gastroesophageal reflux (GERD) 10/12/2018   History of anxiety 10/12/2018   Osteopenia of multiple sites 10/12/2018   Diverticulosis 10/12/2018   Primary osteoarthritis of both hands 10/12/2018   RLS (restless legs syndrome) 03/28/2016    Past Medical History:  Diagnosis Date   Anginal pain (HCC)    Anxiety 2014   Bursitis    Cancer (HCC) 06/2022   left breast DCIS   Dyspnea    with exertion    GERD (gastroesophageal reflux disease) 2008   History of radiation therapy    Left breast- 09/17/22-10/14/22- Dr. Lynwood Nasuti   History of radiation therapy    Right breast-09/23/23-10/28/23- Dr. Lynwood Nasuti   Hypercholesteremia 2003   Hypertension 2003   Insomnia 1988   Kidney stone    Osteoarthritis 2008   hands, knees, right hip   Osteopenia 2002   Restless leg syndrome 2000   RLS (restless legs syndrome) 03/28/2016   Vertigo 2002    Family History  Problem Relation Age of Onset   Heart attack Mother    Depression Mother    Stroke Mother    Diabetes Mother    Heart attack Father    COPD Sister    COPD Sister    COPD Brother    COPD Brother    Cancer Maternal Aunt    Breast cancer Maternal Aunt 60   Restless legs syndrome Daughter    Restless legs syndrome Son    Restless legs syndrome Son    Past Surgical History:  Procedure Laterality Date   ABDOMINAL HYSTERECTOMY     APPENDECTOMY     BLEPHAROPLASTY  2019   BREAST LUMPECTOMY WITH RADIOACTIVE SEED LOCALIZATION Left 07/16/2022   Procedure: LEFT BREAST LUMPECTOMY WITH RADIOACTIVE SEED LOCALIZATION;  Surgeon: Vernetta Berg, MD;  Location:  SURGERY CENTER;  Service: General;  Laterality: Left;   BREAST LUMPECTOMY WITH RADIOACTIVE SEED LOCALIZATION Right 08/11/2023   Procedure: RADIOACTIVE SEED GUIDED RIGHT BREAST LUMPECTOMY;  Surgeon: Vernetta Berg, MD;  Location: [facility] OR;  Service: General;  Laterality: Right;  LMA  CATARACT EXTRACTION Right 02/2010   CHOLECYSTECTOMY     COLONOSCOPY  2015   results normal   LEFT HEART CATH AND CORONARY ANGIOGRAPHY N/A 09/20/2020   Procedure: LEFT HEART CATH AND CORONARY ANGIOGRAPHY;  Surgeon: Court Dorn PARAS, MD;  Location: [facility];  Service: Cardiovascular;  Laterality: N/A;   RE-EXCISION OF BREAST LUMPECTOMY Left 08/13/2022   Procedure: RE-EXCISION OF LEFT BREAST CANCER;  Surgeon: Vernetta Berg, MD;  Location: [facility];  Service:  General;  Laterality: Left;   ROTATOR CUFF REPAIR Right    Social History   Tobacco Use   Smoking status: Former    Current packs/day: 0.00    Average packs/day: 0.5 packs/day for 20.0 years (10.0 ttl pk-yrs)    Types: Cigarettes    Start date: 10/06/1976    Quit date: 10/06/1996    Years since quitting: 27.7    Passive exposure: Past   Smokeless tobacco: Never  Vaping Use   Vaping status: Never Used  Substance Use Topics   Alcohol use: No    Alcohol/week: 0.0 standard drinks of alcohol   Drug use: No   Social History   Social History Narrative   Lives alone in a one story home.  Has 3 children.     Semi retired.     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     Immunization History  Administered Date(s) Administered   Moderna Sars-Covid-2 Vaccination 10/19/2019, 11/15/2019, 08/13/2020, 04/04/2021     Objective: Vital Signs: There were no vitals taken for this visit.   Physical Exam   Musculoskeletal Exam: ***  CDAI Exam: CDAI Score: -- Patient Global: --; Provider Global: -- Swollen: --; Tender: -- Joint Exam 07/07/2024   No joint exam has been documented for this visit   There is currently no information documented on the homunculus. Go to the Rheumatology activity and complete the homunculus joint exam.  Investigation: No additional findings.  Imaging: No results found.  Recent Labs: Lab Results  Component Value Date   WBC 4.8 04/08/2024   HGB 10.5 (L) 04/08/2024   PLT 203 04/08/2024   NA 137 04/08/2024   K 4.1 04/08/2024   CL 104 04/08/2024   CO2 28 04/08/2024   GLUCOSE 95 04/08/2024   BUN 14 04/08/2024   CREATININE 0.80 04/08/2024   BILITOT 0.2 08/05/2023   ALKPHOS 65 08/05/2023   AST 14 08/05/2023   ALT 11 08/05/2023   PROT 6.4 08/05/2023   ALBUMIN  3.8 08/05/2023   CALCIUM  9.1 04/08/2024   GFRAA >60 10/08/2018    Speciality Comments: No specialty comments available.  Procedures:  No procedures performed Allergies:  Ibuprofen, Mobic [meloxicam], Nabumetone, Lexapro [escitalopram oxalate], Lisinopril, Prilosec otc [omeprazole magnesium ], Sulfa antibiotics, and Zoloft [sertraline hcl]   Assessment / Plan:     Visit Diagnoses: No diagnosis found.  Orders: No orders of the defined types were placed in this encounter.  No orders of the defined types were placed in this encounter.   Face-to-face time spent with patient was *** minutes. Greater than 50% of time was spent in counseling and coordination of care.  Follow-Up Instructions: No follow-ups on file.   Daved JAYSON Gavel, CMA  Note - This record has been created using Animal nutritionist.  Chart creation errors have been sought, but may not always  have been located. Such creation errors do not reflect on  the standard of medical care.

## 2024-06-25 NOTE — Progress Notes (Unsigned)
 Alicia [name], female    DOB: 09-Mar-1941    MRN: [id number]   Brief patient profile:  42  yowf from [place]  living in [place] now  quit smoking 1998 s obvious sequelae  referred to pulmonary clinic in [facility]  06/27/2024 by Milo Costa  for pulmonary fibrosis.    Breast ca was sept  2023 [place] rx RT to first the L and then R never chemo but within a few weeks of finishing RT noted  variable DOE > sometimes can't do 10 steps and sometime does treadmill at 66mph/ grade 2% x 15 min mild      History of Present Illness  06/27/2024  Pulmonary/ 1st office eval/ Keiyana Stehr / [facility] Office  Chief Complaint  Patient presents with   Consult   Interstitial Lung Disease    Referred by Dr. Milo Costa. Pt c/o DOE x 2 years. Some days she gets winded walking room to room at home.   Dyspnea:  as above / variable assoc with midline cp with neg cards w/u Cough: minimal green comes and goes no recent abx  Sleep: bed is flat / 2 pillows with overt HB prilosec 40 mg 30 min before lunch  SABA use: not needing / did not help when she took it  02: none     No obvious day to day or daytime pattern/variability or assoc   mucus plugs or hemoptysis or cp or chest tightness, subjective wheeze or overt   hb symptoms.    Also denies any obvious fluctuation of symptoms with weather or environmental changes or other aggravating or alleviating factors except as outlined above   No unusual exposure hx or h/o childhood pna/ asthma or knowledge of premature birth.  Current Allergies, Complete Past Medical History, Past Surgical History, Family History, and Social History were reviewed in [employer] record.  ROS  The following are not active complaints unless bolded Hoarseness, sore throat, dysphagia, dental problems, itching, sneezing,  nasal congestion or discharge of excess mucus or purulent secretions, ear ache,   fever, chills, sweats, unintended wt loss or wt gain, classically pleuritic  or exertional cp,  orthopnea pnd or arm/hand swelling  or leg swelling, presyncope, palpitations, abdominal pain, anorexia, nausea, vomiting, diarrhea  or change in bowel habits or change in bladder habits, change in stools or change in urine, dysuria, hematuria,  rash, arthralgias, visual complaints, headache, numbness, weakness or ataxia or problems with walking or coordination,  change in mood or  memory.            Outpatient Medications Prior to Visit  Medication Sig Dispense Refill   acetaminophen  (TYLENOL ) 650 MG CR tablet Take 1,300 mg by mouth every 8 (eight) hours as needed for pain.     albuterol  (VENTOLIN  HFA) 108 (90 Base) MCG/ACT inhaler Inhale 2 puffs into the lungs every 6 (six) hours as needed for wheezing or shortness of breath. 8 g 2   calcium  carbonate (TUMS - DOSED IN MG ELEMENTAL CALCIUM ) 500 MG chewable tablet Chew 1 tablet by mouth daily as needed for indigestion or heartburn.     Calcium  Magnesium  Zinc  333-133-5 MG TABS Take 1 tablet by mouth daily.     cyanocobalamin  (VITAMIN B12) 1000 MCG tablet Take 1,000 mcg by mouth daily.     docusate sodium  (COLACE) 100 MG capsule Take 1 capsule (100 mg total) by mouth daily. 60 capsule 2   ferrous sulfate  325 (65 FE) MG EC tablet Take 1 tablet (  325 mg total) by mouth daily with breakfast. 60 tablet 3   Flax Oil-Fish Oil-Borage Oil (FISH-FLAX-BORAGE PO) Take 1 capsule by mouth daily.     fluticasone  (FLONASE ) 50 MCG/ACT nasal spray Place 1 spray into both nostrils daily. 16 g 0   irbesartan  (AVAPRO ) 300 MG tablet Take 1 tablet (300 mg total) by mouth daily. 30 tablet 11   isosorbide  mononitrate (IMDUR ) 30 MG 24 hr tablet Take 0.5 tablets (15 mg total) by mouth daily. Take 1/2 (one-half) tablet by mouth once daily 45 tablet 3   LORazepam  (ATIVAN ) 0.5 MG tablet Take 0.5 mg by mouth 2 (two) times daily as needed for anxiety.     metoprolol  succinate (TOPROL  XL) 25 MG 24 hr tablet Take 0.5 tablets (12.5 mg total) by mouth daily. 80  tablet 3   Misc Natural Products (OSTEO BI-FLEX TRIPLE STRENGTH) TABS Take 1 tablet by mouth daily.     Multiple Minerals-Vitamins (CAL MAG ZINC  +D3 PO) Take 1 tablet by mouth daily.     multivitamin-lutein  (OCUVITE-LUTEIN ) CAPS Take 1 capsule by mouth daily.     omeprazole (PRILOSEC) 40 MG capsule Take 40 mg by mouth daily.     rOPINIRole  (REQUIP ) 1 MG tablet Take 1 tablet (1 mg total) by mouth 3 (three) times daily. 270 tablet 3   simvastatin  (ZOCOR ) 20 MG tablet Take 1 tablet (20 mg total) by mouth every evening. 30 tablet 11   No facility-administered medications prior to visit.    Past Medical History:  Diagnosis Date   Anginal pain (HCC)    Anxiety 2014   Bursitis    Cancer (HCC) 06/2022   left breast DCIS   Dyspnea    with exertion   GERD (gastroesophageal reflux disease) 2008   History of radiation therapy    Left breast- 09/17/22-10/14/22- Dr. Lynwood Nasuti   History of radiation therapy    Right breast-09/23/23-10/28/23- Dr. Lynwood Nasuti   Hypercholesteremia 2003   Hypertension 2003   Insomnia 1988   Kidney stone    Osteoarthritis 2008   hands, knees, right hip   Osteopenia 2002   Restless leg syndrome 2000   RLS (restless legs syndrome) 03/28/2016   Vertigo 2002      Objective:     BP (!) 100/58   Pulse 76   Ht 5' 1 (1.549 m)   Wt 136 lb 3.2 oz (61.8 kg)   SpO2 96% Comment: on RA  BMI 25.73 kg/m   SpO2: 96 % (on RA) pleasant am wf dressed all in orange   HEENT : Oropharynx  nl      Nasal turbinates nl    NECK :  without  apparent JVD/ palpable Nodes/TM    LUNGS: no acc muscle use,  Nl contour chest which with minimal crackles bases  bilaterally without cough on insp or exp maneuvers   CV:  RRR  no s3 or murmur or increase in P2, and no edema   ABD:  soft and nontender   MS:  Gait nl   ext warm without deformities Or obvious joint restrictions  calf tenderness, cyanosis or clubbing    SKIN: warm and dry without lesions    NEURO:  alert,  approp, nl sensorium with  no motor or cerebellar deficits apparent.    I personally reviewed images and agree with radiology impression as follows:  CXR:   PA and lateral  05/16/24 1. No focal consolidations. Decreased conspicuity of previously noted diffuse interstitial coarsening. 2. Mild subpleural  reticulations, which may represent a component of fibrosis    Assessment   Assessment & Plan DOE (dyspnea on exertion) Onset x 2024 p bilateral RT for breast ca  - max gerd rx  06/27/2024 >>>  - 06/27/2024   Walked on RA  x  3  lap(s) =  approx 450  ft  @ fast pace, stopped due to end of study  with lowest 02 sats 92%   - monitor with serial walking  06/27/2024   The pattern of varibale doe assoc with midlne cp and neg cards w/u is not c/w a progressive PF pattern though she could have some mild residual PF from previous infection or RT  For now re max rx for GERD as the likely source of CP and monitor by serial sats at peak ex and f/u with full pfts   Discussed in detail all the  indications, usual  risks and alternatives  relative to the benefits with patient who agrees to proceed with rx  w/u as outlined.      Need for influenza vaccination - high dose flu vaccine given    Each maintenance medication was reviewed in detail including emphasizing most importantly the difference between maintenance and prns and under what circumstances the prns are to be triggered using an action plan format where appropriate.  Total time for H and P, chart review, counseling,  directly observing portions of ambulatory 02 saturation study/ and generating customized AVS unique to this office visit / same day charting = 39 min new pt eval                   AVS  Patient Instructions  Pepcid 20mg   OTC  after supper or at least an hour before bedtime   GERD (REFLUX)  is an extremely common cause of respiratory symptoms just like yours , many times with no obvious heartburn at all.    It can be  treated with medication, but also with lifestyle changes including elevation of the head of your bed (ideally with 6-8inch blocks under the headboard of your bed),  Smoking cessation, avoidance of late meals (4 hours before bed)  excessive alcohol, and avoid fatty foods, chocolate, peppermint, colas, red wine, and acidic juices such as orange juice.  NO MINT OR MENTHOL PRODUCTS SO NO COUGH DROPS  USE SUGARLESS CANDY INSTEAD (Jolley ranchers or Stover's or Life Savers) or even ice chips will also do - the key is to swallow to prevent all throat clearing. NO OIL BASED VITAMINS - use powdered substitutes.  Avoid fish oil also.    Make sure you check your oxygen saturation at your highest level of activity(NOT after you stop)  to be sure it stays over 90% and keep track of it at least once a week, more often if breathing getting worse, and let me know if losing ground. (Collect the dots to connect the dots approach)    Please schedule a follow up office visit in 6 weeks, call sooner if needed with PFTs on return         Ozell America, MD 06/28/2024

## 2024-06-27 ENCOUNTER — Encounter: Payer: Self-pay | Admitting: Internal Medicine

## 2024-06-27 ENCOUNTER — Ambulatory Visit: Admitting: Internal Medicine

## 2024-06-27 VITALS — BP 100/58 | HR 76 | Ht 61.0 in | Wt 136.2 lb

## 2024-06-27 DIAGNOSIS — Z23 Encounter for immunization: Secondary | ICD-10-CM | POA: Diagnosis not present

## 2024-06-27 DIAGNOSIS — R0609 Other forms of dyspnea: Secondary | ICD-10-CM

## 2024-06-27 NOTE — Patient Instructions (Addendum)
 Pepcid 20mg   OTC  after supper or at least an hour before bedtime   GERD (REFLUX)  is an extremely common cause of respiratory symptoms just like yours , many times with no obvious heartburn at all.    It can be treated with medication, but also with lifestyle changes including elevation of the head of your bed (ideally with 6-8inch blocks under the headboard of your bed),  Smoking cessation, avoidance of late meals (4 hours before bed)  excessive alcohol, and avoid fatty foods, chocolate, peppermint, colas, red wine, and acidic juices such as orange juice.  NO MINT OR MENTHOL PRODUCTS SO NO COUGH DROPS  USE SUGARLESS CANDY INSTEAD (Jolley ranchers or Stover's or Life Savers) or even ice chips will also do - the key is to swallow to prevent all throat clearing. NO OIL BASED VITAMINS - use powdered substitutes.  Avoid fish oil also.    Make sure you check your oxygen saturation at your highest level of activity(NOT after you stop)  to be sure it stays over 90% and keep track of it at least once a week, more often if breathing getting worse, and let me know if losing ground. (Collect the dots to connect the dots approach)    Please schedule a follow up office visit in 6 weeks, call sooner if needed with PFTs on return

## 2024-06-28 ENCOUNTER — Ambulatory Visit (INDEPENDENT_AMBULATORY_CARE_PROVIDER_SITE_OTHER): Admitting: Internal Medicine

## 2024-06-28 DIAGNOSIS — R0609 Other forms of dyspnea: Secondary | ICD-10-CM

## 2024-06-28 LAB — PULMONARY FUNCTION TEST
DL/VA % pred: 82 %
DL/VA: 3.41 ml/min/mmHg/L
DLCO unc % pred: 74 %
DLCO unc: 12.53 ml/min/mmHg
FEF 25-75 Post: 2.41 L/s
FEF 25-75 Pre: 2.26 L/s
FEF2575-%Change-Post: 6 %
FEF2575-%Pred-Post: 202 %
FEF2575-%Pred-Pre: 189 %
FEV1-%Change-Post: 0 %
FEV1-%Pred-Post: 119 %
FEV1-%Pred-Pre: 118 %
FEV1-Post: 1.95 L
FEV1-Pre: 1.93 L
FEV1FVC-%Change-Post: 2 %
FEV1FVC-%Pred-Pre: 111 %
FEV6-%Change-Post: -2 %
FEV6-%Pred-Post: 110 %
FEV6-%Pred-Pre: 113 %
FEV6-Post: 2.29 L
FEV6-Pre: 2.36 L
FEV6FVC-%Change-Post: 0 %
FEV6FVC-%Pred-Post: 106 %
FEV6FVC-%Pred-Pre: 106 %
FVC-%Change-Post: -1 %
FVC-%Pred-Post: 104 %
FVC-%Pred-Pre: 106 %
FVC-Post: 2.31 L
FVC-Pre: 2.36 L
Post FEV1/FVC ratio: 84 %
Post FEV6/FVC ratio: 100 %
Pre FEV1/FVC ratio: 82 %
Pre FEV6/FVC Ratio: 100 %
RV % pred: 73 %
RV: 1.67 L
TLC % pred: 85 %
TLC: 3.94 L

## 2024-06-28 NOTE — Assessment & Plan Note (Addendum)
 Onset x 2024 p bilateral RT for breast ca  - max gerd rx  06/27/2024 >>>  - 06/27/2024   Walked on RA  x  3  lap(s) =  approx 450  ft  @ fast pace, stopped due to end of study  with lowest 02 sats 92%   - monitor with serial walking  06/27/2024   The pattern of varibale doe assoc with midlne cp and neg cards w/u is not c/w a progressive PF pattern though she could have some mild residual PF from previous infection or RT  For now re max rx for GERD as the likely source of CP and monitor by serial sats at peak ex and f/u with full pfts   Discussed in detail all the  indications, usual  risks and alternatives  relative to the benefits with patient who agrees to proceed with rx  w/u as outlined.

## 2024-06-28 NOTE — Patient Instructions (Signed)
 Full PFT performed today.

## 2024-06-28 NOTE — Progress Notes (Signed)
 Full PFT performed today.

## 2024-07-03 ENCOUNTER — Ambulatory Visit: Payer: Self-pay | Admitting: Internal Medicine

## 2024-07-04 NOTE — Progress Notes (Signed)
 Called and spoke with pt regarding her pft results - pt confirmed understanding

## 2024-07-05 DIAGNOSIS — M1990 Unspecified osteoarthritis, unspecified site: Secondary | ICD-10-CM | POA: Diagnosis not present

## 2024-07-05 DIAGNOSIS — M7732 Calcaneal spur, left foot: Secondary | ICD-10-CM | POA: Diagnosis not present

## 2024-07-05 DIAGNOSIS — M25775 Osteophyte, left foot: Secondary | ICD-10-CM | POA: Diagnosis not present

## 2024-07-05 DIAGNOSIS — M79672 Pain in left foot: Secondary | ICD-10-CM | POA: Diagnosis not present

## 2024-07-05 DIAGNOSIS — F411 Generalized anxiety disorder: Secondary | ICD-10-CM | POA: Diagnosis not present

## 2024-07-05 DIAGNOSIS — L110 Acquired keratosis follicularis: Secondary | ICD-10-CM | POA: Diagnosis not present

## 2024-07-05 DIAGNOSIS — M779 Enthesopathy, unspecified: Secondary | ICD-10-CM | POA: Diagnosis not present

## 2024-07-05 DIAGNOSIS — E782 Mixed hyperlipidemia: Secondary | ICD-10-CM | POA: Diagnosis not present

## 2024-07-05 DIAGNOSIS — M79675 Pain in left toe(s): Secondary | ICD-10-CM | POA: Diagnosis not present

## 2024-07-05 DIAGNOSIS — M19072 Primary osteoarthritis, left ankle and foot: Secondary | ICD-10-CM | POA: Diagnosis not present

## 2024-07-05 DIAGNOSIS — N183 Chronic kidney disease, stage 3 unspecified: Secondary | ICD-10-CM | POA: Diagnosis not present

## 2024-07-07 ENCOUNTER — Ambulatory Visit: Admitting: Rheumatology

## 2024-07-07 DIAGNOSIS — M8589 Other specified disorders of bone density and structure, multiple sites: Secondary | ICD-10-CM

## 2024-07-07 DIAGNOSIS — E785 Hyperlipidemia, unspecified: Secondary | ICD-10-CM

## 2024-07-07 DIAGNOSIS — M7061 Trochanteric bursitis, right hip: Secondary | ICD-10-CM

## 2024-07-07 DIAGNOSIS — M1612 Unilateral primary osteoarthritis, left hip: Secondary | ICD-10-CM

## 2024-07-07 DIAGNOSIS — G8929 Other chronic pain: Secondary | ICD-10-CM

## 2024-07-07 DIAGNOSIS — Z87442 Personal history of urinary calculi: Secondary | ICD-10-CM

## 2024-07-07 DIAGNOSIS — D0512 Intraductal carcinoma in situ of left breast: Secondary | ICD-10-CM

## 2024-07-07 DIAGNOSIS — I10 Essential (primary) hypertension: Secondary | ICD-10-CM

## 2024-07-07 DIAGNOSIS — Z8659 Personal history of other mental and behavioral disorders: Secondary | ICD-10-CM

## 2024-07-07 DIAGNOSIS — G2581 Restless legs syndrome: Secondary | ICD-10-CM

## 2024-07-07 DIAGNOSIS — K5909 Other constipation: Secondary | ICD-10-CM

## 2024-07-07 DIAGNOSIS — D0511 Intraductal carcinoma in situ of right breast: Secondary | ICD-10-CM

## 2024-07-07 DIAGNOSIS — Z8719 Personal history of other diseases of the digestive system: Secondary | ICD-10-CM

## 2024-07-07 DIAGNOSIS — M19041 Primary osteoarthritis, right hand: Secondary | ICD-10-CM

## 2024-07-07 DIAGNOSIS — K5790 Diverticulosis of intestine, part unspecified, without perforation or abscess without bleeding: Secondary | ICD-10-CM

## 2024-07-20 NOTE — Progress Notes (Deleted)
 Office Visit Note  Patient: Alicia [name]             Date of Birth: Aug 17, 1941           MRN: [id number]             PCP: Auston Opal, DO Referring: Auston Opal, DO Visit Date: 08/03/2024 Occupation: Data Unavailable  Subjective:  No chief complaint on file.   History of Present Illness: Alicia [name] is a 83 y.o. female ***     Activities of Daily Living:  Patient reports morning stiffness for *** {minute/hour:19697}.   Patient {ACTIONS;DENIES/REPORTS:21021675::Denies} nocturnal pain.  Difficulty dressing/grooming: {ACTIONS;DENIES/REPORTS:21021675::Denies} Difficulty climbing stairs: {ACTIONS;DENIES/REPORTS:21021675::Denies} Difficulty getting out of chair: {ACTIONS;DENIES/REPORTS:21021675::Denies} Difficulty using hands for taps, buttons, cutlery, and/or writing: {ACTIONS;DENIES/REPORTS:21021675::Denies}  No Rheumatology ROS completed.   PMFS History:  Patient Active Problem List   Diagnosis Date Noted   DOE (dyspnea on exertion) 06/27/2024   Iron  deficiency anemia 05/12/2024   Restless leg syndrome 04/07/2024   GERD without esophagitis 04/07/2024   Sepsis due to pneumonia (HCC) 04/06/2024   Ductal carcinoma in situ (DCIS) of right breast 09/09/2023   Genetic testing 07/14/2022   Ductal carcinoma in situ (DCIS) of left breast 06/30/2022   Primary osteoarthritis of left hip 07/02/2021   Family history of heart disease 09/12/2020   Chest pain of uncertain etiology 09/12/2020   Essential hypertension 10/12/2018   Dyslipidemia 10/12/2018   History of kidney stones 10/12/2018   History of gastroesophageal reflux (GERD) 10/12/2018   History of anxiety 10/12/2018   Osteopenia of multiple sites 10/12/2018   Diverticulosis 10/12/2018   Primary osteoarthritis of both hands 10/12/2018   RLS (restless legs syndrome) 03/28/2016    Past Medical History:  Diagnosis Date   Anginal pain    Anxiety 2014   Bursitis    Cancer (HCC) 06/2022   left breast DCIS    Dyspnea    with exertion   GERD (gastroesophageal reflux disease) 2008   History of radiation therapy    Left breast- 09/17/22-10/14/22- Dr. Lynwood Nasuti   History of radiation therapy    Right breast-09/23/23-10/28/23- Dr. Lynwood Nasuti   Hypercholesteremia 2003   Hypertension 2003   Insomnia 1988   Kidney stone    Osteoarthritis 2008   hands, knees, right hip   Osteopenia 2002   Restless leg syndrome 2000   RLS (restless legs syndrome) 03/28/2016   Vertigo 2002    Family History  Problem Relation Age of Onset   Heart attack Mother    Depression Mother    Stroke Mother    Diabetes Mother    Heart attack Father    COPD Sister    COPD Sister    COPD Brother    COPD Brother    Cancer Maternal Aunt    Breast cancer Maternal Aunt 60   Restless legs syndrome Daughter    Restless legs syndrome Son    Restless legs syndrome Son    Past Surgical History:  Procedure Laterality Date   ABDOMINAL HYSTERECTOMY     APPENDECTOMY     BLEPHAROPLASTY  2019   BREAST LUMPECTOMY WITH RADIOACTIVE SEED LOCALIZATION Left 07/16/2022   Procedure: LEFT BREAST LUMPECTOMY WITH RADIOACTIVE SEED LOCALIZATION;  Surgeon: Vernetta Berg, MD;  Location: [facility];  Service: General;  Laterality: Left;   BREAST LUMPECTOMY WITH RADIOACTIVE SEED LOCALIZATION Right 08/11/2023   Procedure: RADIOACTIVE SEED GUIDED RIGHT BREAST LUMPECTOMY;  Surgeon: Vernetta Berg, MD;  Location[facility];  Service:  General;  Laterality: Right;  LMA   CATARACT EXTRACTION Right 02/2010   CHOLECYSTECTOMY     COLONOSCOPY  2015   results normal   LEFT HEART CATH AND CORONARY ANGIOGRAPHY N/A 09/20/2020   Procedure: LEFT HEART CATH AND CORONARY ANGIOGRAPHY;  Surgeon: Court Dorn PARAS, MD;  Location: [facility];  Service: Cardiovascular;  Laterality: N/A;   RE-EXCISION OF BREAST LUMPECTOMY Left 08/13/2022   Procedure: RE-EXCISION OF LEFT BREAST CANCER;  Surgeon: Vernetta Berg, MD;  Location: [facility];  Service: General;  Laterality: Left;   ROTATOR CUFF REPAIR Right    Social History   Tobacco Use   Smoking status: Former    Current packs/day: 0.00    Average packs/day: 0.5 packs/day for 20.0 years (10.0 ttl pk-yrs)    Types: Cigarettes    Start date: 10/06/1976    Quit date: 10/06/1996    Years since quitting: 27.8    Passive exposure: Past   Smokeless tobacco: Never  Vaping Use   Vaping status: Never Used  Substance Use Topics   Alcohol use: No    Alcohol/week: 0.0 standard drinks of alcohol   Drug use: No   Social History   Social History Narrative   Lives alone in a one story home.  Has 3 children.     Semi retired.     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     Immunization History  Administered Date(s) Administered   INFLUENZA, HIGH DOSE SEASONAL PF 06/27/2024   Moderna Sars-Covid-2 Vaccination 10/19/2019, 11/15/2019, 08/13/2020, 04/04/2021     Objective: Vital Signs: There were no vitals taken for this visit.   Physical Exam   Musculoskeletal Exam: ***  CDAI Exam: CDAI Score: -- Patient Global: --; Provider Global: -- Swollen: --; Tender: -- Joint Exam 08/03/2024   No joint exam has been documented for this visit   There is currently no information documented on the homunculus. Go to the Rheumatology activity and complete the homunculus joint exam.  Investigation: No additional findings.  Imaging: No results found.  Recent Labs: Lab Results  Component Value Date   WBC 4.8 04/08/2024   HGB 10.5 (L) 04/08/2024   PLT 203 04/08/2024   NA 137 04/08/2024   K 4.1 04/08/2024   CL 104 04/08/2024   CO2 28 04/08/2024   GLUCOSE 95 04/08/2024   BUN 14 04/08/2024   CREATININE 0.80 04/08/2024   BILITOT 0.2 08/05/2023   ALKPHOS 65 08/05/2023   AST 14 08/05/2023   ALT 11 08/05/2023   PROT 6.4 08/05/2023   ALBUMIN  3.8 08/05/2023   CALCIUM  9.1 04/08/2024   GFRAA >60 10/08/2018    Speciality Comments: No  specialty comments available.  Procedures:  No procedures performed Allergies: Ibuprofen, Mobic [meloxicam], Nabumetone, Lexapro [escitalopram oxalate], Lisinopril, Prilosec otc [omeprazole magnesium ], Sulfa antibiotics, and Zoloft [sertraline hcl]   Assessment / Plan:     Visit Diagnoses: No diagnosis found.  Orders: No orders of the defined types were placed in this encounter.  No orders of the defined types were placed in this encounter.   Face-to-face time spent with patient was *** minutes. Greater than 50% of time was spent in counseling and coordination of care.  Follow-Up Instructions: No follow-ups on file.   Daved JAYSON Gavel, CMA  Note - This record has been created using Animal nutritionist.  Chart creation errors have been sought, but may not always  have been located. Such creation errors do not reflect  on  the standard of medical care.

## 2024-08-01 NOTE — Progress Notes (Signed)
 Office Visit Note  Patient: Alicia [name]             Date of Birth: 14-Mar-1941           MRN: [id number]             PCP: Auston Opal, DO Referring: Auston Opal, DO Visit Date: 08/09/2024 Occupation: Data Unavailable  Subjective:  Pain in right hip  History of Present Illness: Alicia [name] is a 83 y.o. female with osteoarthritis.  She returns today after her last visit in March 2025.  She had right trochanteric bursa injection on December 31, 2023.  She states the cortisone injection in the right hip was very effective and lasted for a long time.  The symptoms have recurred now for the last 3 weeks.  She continues to have discomfort in her hands.  She denies any discomfort in her shoulders.  Lower back pain is manageable.    Activities of Daily Living:  Patient reports morning stiffness for 10 minutes.   Patient Reports nocturnal pain.  Difficulty dressing/grooming: Denies Difficulty climbing stairs: Denies Difficulty getting out of chair: Denies Difficulty using hands for taps, buttons, cutlery, and/or writing: Reports  Review of Systems  Constitutional:  Positive for fatigue.  HENT:  Positive for mouth dryness. Negative for mouth sores.   Eyes:  Positive for dryness.  Respiratory:  Positive for shortness of breath.   Cardiovascular:  Positive for palpitations. Negative for chest pain.  Gastrointestinal:  Positive for constipation. Negative for blood in stool and diarrhea.  Endocrine: Negative for increased urination.  Genitourinary:  Negative for involuntary urination.  Musculoskeletal:  Positive for joint pain, joint pain, myalgias, muscle weakness, morning stiffness, muscle tenderness and myalgias. Negative for gait problem and joint swelling.  Skin:  Negative for color change, rash, hair loss and sensitivity to sunlight.  Allergic/Immunologic: Negative for susceptible to infections.  Neurological:  Negative for dizziness and headaches.  Hematological:  Negative for  swollen glands.  Psychiatric/Behavioral:  Positive for sleep disturbance. Negative for depressed mood. The patient is not nervous/anxious.     PMFS History:  Patient Active Problem List   Diagnosis Date Noted   DOE (dyspnea on exertion) 06/27/2024   Iron  deficiency anemia 05/12/2024   Restless leg syndrome 04/07/2024   GERD without esophagitis 04/07/2024   Sepsis due to pneumonia (HCC) 04/06/2024   Ductal carcinoma in situ (DCIS) of right breast 09/09/2023   Genetic testing 07/14/2022   Ductal carcinoma in situ (DCIS) of left breast 06/30/2022   Primary osteoarthritis of left hip 07/02/2021   Family history of heart disease 09/12/2020   Chest pain of uncertain etiology 09/12/2020   Essential hypertension 10/12/2018   Dyslipidemia 10/12/2018   History of kidney stones 10/12/2018   History of gastroesophageal reflux (GERD) 10/12/2018   History of anxiety 10/12/2018   Osteopenia of multiple sites 10/12/2018   Diverticulosis 10/12/2018   Primary osteoarthritis of both hands 10/12/2018   RLS (restless legs syndrome) 03/28/2016    Past Medical History:  Diagnosis Date   Allergy    Anginal pain    Anxiety 09/28/2013   Trauma   Breast cancer (HCC) 06/19/2022   Bursitis    Cancer (HCC) 06/2022   left breast DCIS   Cataract    Dyspnea    with exertion   GERD (gastroesophageal reflux disease)    Several year ago   History of radiation therapy    Left breast- 09/17/22-10/14/22- Dr. Lynwood Nasuti   History of  radiation therapy    Right breast-09/23/23-10/28/23- Dr. Lynwood Nasuti   Hypercholesteremia 2003   Hypertension    Insomnia 1988   Kidney stone    Osteoarthritis 2008   hands, knees, right hip   Osteopenia 2002   Restless leg syndrome 2000   RLS (restless legs syndrome) 03/28/2016   Vertigo 2002    Family History  Problem Relation Age of Onset   Heart attack Mother    Depression Mother    Stroke Mother    Diabetes Mother    Heart attack Father    COPD Sister     COPD Sister    COPD Brother    COPD Brother    Cancer Maternal Aunt    Breast cancer Maternal Aunt 60   Restless legs syndrome Daughter    Restless legs syndrome Son    Restless legs syndrome Son    Past Surgical History:  Procedure Laterality Date   ABDOMINAL HYSTERECTOMY  2980 ?   APPENDECTOMY     BLEPHAROPLASTY  2019   BREAST LUMPECTOMY WITH RADIOACTIVE SEED LOCALIZATION Left 07/16/2022   Procedure: LEFT BREAST LUMPECTOMY WITH RADIOACTIVE SEED LOCALIZATION;  Surgeon: Vernetta Berg, MD;  Location: [facility];  Service: General;  Laterality: Left;   BREAST LUMPECTOMY WITH RADIOACTIVE SEED LOCALIZATION Right 08/11/2023   Procedure: RADIOACTIVE SEED GUIDED RIGHT BREAST LUMPECTOMY;  Surgeon: Vernetta Berg, MD;  Location: [facility] OR;  Service: General;  Laterality: Right;  LMA   CATARACT EXTRACTION Right 02/2010   CHOLECYSTECTOMY     1979 ?   COLONOSCOPY  2015   results normal   LEFT HEART CATH AND CORONARY ANGIOGRAPHY N/A 09/20/2020   Procedure: LEFT HEART CATH AND CORONARY ANGIOGRAPHY;  Surgeon: Court Dorn PARAS, MD;  Location: [facility];  Service: Cardiovascular;  Laterality: N/A;   RE-EXCISION OF BREAST LUMPECTOMY Left 08/13/2022   Procedure: RE-EXCISION OF LEFT BREAST CANCER;  Surgeon: Vernetta Berg, MD;  Location: [facility];  Service: General;  Laterality: Left;   ROTATOR CUFF REPAIR Right    Social History   Tobacco Use   Smoking status: Former    Current packs/day: 0.00    Average packs/day: 0.5 packs/day for 20.0 years (10.0 ttl pk-yrs)    Types: Cigarettes    Start date: 10/06/1976    Quit date: 10/06/1996    Years since quitting: 27.8    Passive exposure: Past   Smokeless tobacco: Never  Vaping Use   Vaping status: Never Used  Substance Use Topics   Alcohol use: No    Alcohol/week: 0.0 standard drinks of alcohol   Drug use: No   Social History   Social History Narrative   Lives alone in a one story home.  Has 3  children.     Semi retired.     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     Immunization History  Administered Date(s) Administered   INFLUENZA, HIGH DOSE SEASONAL PF 06/27/2024   Moderna Sars-Covid-2 Vaccination 10/19/2019, 11/15/2019, 08/13/2020, 04/04/2021   Pfizer(Comirnaty)Fall Seasonal Vaccine 12 years and older 06/12/2023     Objective: Vital Signs: BP 128/66   Pulse 60   Temp (!) 97.1 F (36.2 C)   Resp 14   Ht 5' 1 (1.549 m)   Wt 135 lb 9.6 oz (61.5 kg)   BMI 25.62 kg/m    Physical Exam Vitals and nursing note reviewed.  Constitutional:      Appearance: She is well-developed.  HENT:  Head: Normocephalic and atraumatic.  Eyes:     Conjunctiva/sclera: Conjunctivae normal.  Cardiovascular:     Rate and Rhythm: Normal rate and regular rhythm.     Heart sounds: Normal heart sounds.  Pulmonary:     Effort: Pulmonary effort is normal.     Breath sounds: Normal breath sounds.     Comments: Crackles audible in bilateral lung bases. Abdominal:     General: Bowel sounds are normal.     Palpations: Abdomen is soft.  Musculoskeletal:     Cervical back: Normal range of motion.  Lymphadenopathy:     Cervical: No cervical adenopathy.  Skin:    General: Skin is warm and dry.     Capillary Refill: Capillary refill takes less than 2 seconds.  Neurological:     Mental Status: She is alert and oriented to person, place, and time.  Psychiatric:        Behavior: Behavior normal.      Musculoskeletal Exam: She had limited lateral rotation of the cervical spine.  There was no tenderness over thoracic or lumbar spine.  Shoulders, elbows, wrist joints were in good range of motion.  She had bilateral PIP and DIP thickening.  She had limited flexion of bilateral PIP and DIP joints.  Hip joints were in good range of motion.  She tenderness over right trochanteric bursa.  Knee joints with good range of motion without any warmth swelling or effusion.  There was no  tenderness over ankles or MTPs.  CDAI Exam: CDAI Score: -- Patient Global: --; Provider Global: -- Swollen: --; Tender: -- Joint Exam 08/09/2024   No joint exam has been documented for this visit   There is currently no information documented on the homunculus. Go to the Rheumatology activity and complete the homunculus joint exam.  Investigation: No additional findings.  Imaging: No results found.  Recent Labs: Lab Results  Component Value Date   WBC 4.8 04/08/2024   HGB 10.5 (L) 04/08/2024   PLT 203 04/08/2024   NA 137 04/08/2024   K 4.1 04/08/2024   CL 104 04/08/2024   CO2 28 04/08/2024   GLUCOSE 95 04/08/2024   BUN 14 04/08/2024   CREATININE 0.80 04/08/2024   BILITOT 0.2 08/05/2023   ALKPHOS 65 08/05/2023   AST 14 08/05/2023   ALT 11 08/05/2023   PROT 6.4 08/05/2023   ALBUMIN  3.8 08/05/2023   CALCIUM  9.1 04/08/2024   GFRAA >60 10/08/2018     Speciality Comments: No specialty comments available.  Procedures:  Large Joint Inj: R greater trochanter on 08/09/2024 3:32 PM Indications: pain Details: 27 G 1.5 in needle, lateral approach  Arthrogram: No  Medications: 40 mg triamcinolone  acetonide 40 MG/ML; 1.5 mL lidocaine  1 % Aspirate: 0 mL Outcome: tolerated well, no immediate complications  Risk of infection, tendon injury, nerve injury, hypopigmentation and dermal atrophy were discussed. Procedure, treatment alternatives, risks and benefits explained, specific risks discussed. Consent was given by the patient. Immediately prior to procedure a time out was called to verify the correct patient, procedure, equipment, support staff and site/side marked as required. Patient was prepped and draped in the usual sterile fashion.     Allergies: Ibuprofen, Mobic [meloxicam], Nabumetone, Lexapro [escitalopram oxalate], Lisinopril, Prilosec otc [omeprazole magnesium ], Sulfa antibiotics, and Zoloft [sertraline hcl]   Assessment / Plan:     Visit Diagnoses: Primary  osteoarthritis of both hands-bilateral PIP and DIP thickening was noted.  She continues to have some stiffness in her hands.  Joint protection muscle  strengthening was discussed.  Chronic pain of both shoulders-she has intermittent discomfort.  She had good range of motion of bilateral shoulders.  Primary osteoarthritis of left hip-she had limitation range of motion without much discomfort.  Trochanteric bursitis of both hips -she has recurrence of right trochanteric bursitis.  She good response to previous injection.  After informed consent was obtained and side effects were discussed right trochanteric bursa was injected with lidocaine  and Kenalog  as described above.  Patient tolerated the procedure well.  Postprocedure instructions were given.  Plan: Large Joint Inj  Lung crackles-crackles were audible in her bilateral lung bases.  Patient states she was seen by pulmonologist and will be getting CT scan for evaluation.  Chronic midline low back pain without sciatica-she has intermittent lower back pain.  Osteopenia of multiple sites - 04/11/2021 T-score: -1.03, BMD: 0.705 left femoral neck.  Patient will get repeat DEXA scan through her PCP. Feb 17, 2024 T-score -1.6, BMD 0.675 right femoral neck no comparison, right total femur -13%, left total femur -11%, AP spine 1%.  DEXA scan results were reviewed with the patient.  She wants to continue with calcium  and vitamin D.  I gave her a handout on Fosamax to review which we can discuss at the follow-up visit.  Ductal carcinoma in situ (DCIS) of right breast - 09/24, status postlumpectomy and radiation therapy.  Restarted anastrozole  November 2024 and discontinued  Other medical problems are listed as follows:  History of gastroesophageal reflux (GERD)  Diverticulosis  Chronic constipation  Essential hypertension  Dyslipidemia  History of kidney stones  RLS (restless legs syndrome)  History of anxiety  Orders: Orders Placed This  Encounter  Procedures   Large Joint Inj   No orders of the defined types were placed in this encounter.    Follow-Up Instructions: Return in about 6 months (around 02/06/2025) for Osteoarthritis.   Maya Nash, MD  Note - This record has been created using Animal nutritionist.  Chart creation errors have been sought, but may not always  have been located. Such creation errors do not reflect on  the standard of medical care.

## 2024-08-03 ENCOUNTER — Ambulatory Visit: Admitting: Rheumatology

## 2024-08-03 DIAGNOSIS — E785 Hyperlipidemia, unspecified: Secondary | ICD-10-CM

## 2024-08-03 DIAGNOSIS — G2581 Restless legs syndrome: Secondary | ICD-10-CM

## 2024-08-03 DIAGNOSIS — M545 Low back pain, unspecified: Secondary | ICD-10-CM

## 2024-08-03 DIAGNOSIS — K5909 Other constipation: Secondary | ICD-10-CM

## 2024-08-03 DIAGNOSIS — Z8719 Personal history of other diseases of the digestive system: Secondary | ICD-10-CM

## 2024-08-03 DIAGNOSIS — Z8659 Personal history of other mental and behavioral disorders: Secondary | ICD-10-CM

## 2024-08-03 DIAGNOSIS — K5790 Diverticulosis of intestine, part unspecified, without perforation or abscess without bleeding: Secondary | ICD-10-CM

## 2024-08-03 DIAGNOSIS — D0511 Intraductal carcinoma in situ of right breast: Secondary | ICD-10-CM

## 2024-08-03 DIAGNOSIS — Z87442 Personal history of urinary calculi: Secondary | ICD-10-CM

## 2024-08-03 DIAGNOSIS — M19041 Primary osteoarthritis, right hand: Secondary | ICD-10-CM

## 2024-08-03 DIAGNOSIS — D0512 Intraductal carcinoma in situ of left breast: Secondary | ICD-10-CM

## 2024-08-03 DIAGNOSIS — M8589 Other specified disorders of bone density and structure, multiple sites: Secondary | ICD-10-CM

## 2024-08-03 DIAGNOSIS — M1612 Unilateral primary osteoarthritis, left hip: Secondary | ICD-10-CM

## 2024-08-03 DIAGNOSIS — M7061 Trochanteric bursitis, right hip: Secondary | ICD-10-CM

## 2024-08-03 DIAGNOSIS — I10 Essential (primary) hypertension: Secondary | ICD-10-CM

## 2024-08-03 DIAGNOSIS — G8929 Other chronic pain: Secondary | ICD-10-CM

## 2024-08-05 DIAGNOSIS — M1990 Unspecified osteoarthritis, unspecified site: Secondary | ICD-10-CM | POA: Diagnosis not present

## 2024-08-05 DIAGNOSIS — N183 Chronic kidney disease, stage 3 unspecified: Secondary | ICD-10-CM | POA: Diagnosis not present

## 2024-08-05 DIAGNOSIS — F411 Generalized anxiety disorder: Secondary | ICD-10-CM | POA: Diagnosis not present

## 2024-08-05 DIAGNOSIS — E782 Mixed hyperlipidemia: Secondary | ICD-10-CM | POA: Diagnosis not present

## 2024-08-08 ENCOUNTER — Encounter: Payer: Self-pay | Admitting: Internal Medicine

## 2024-08-08 ENCOUNTER — Ambulatory Visit: Admitting: Internal Medicine

## 2024-08-08 VITALS — BP 131/75 | HR 66 | Ht 61.0 in | Wt 135.8 lb

## 2024-08-08 DIAGNOSIS — Z87891 Personal history of nicotine dependence: Secondary | ICD-10-CM

## 2024-08-08 DIAGNOSIS — R0609 Other forms of dyspnea: Secondary | ICD-10-CM | POA: Diagnosis not present

## 2024-08-08 NOTE — Patient Instructions (Addendum)
 Make sure you check your oxygen saturation at your highest level of activity (NOT after you stop)  to be sure it stays over 90% and keep track of it at least once a week, more often if breathing getting worse, and let me know if losing ground. (Collect the dots to connect the dots approach)     My office will be contacting you by phone for referral to high resolution CT chest  around 11/06/24  - if you don't hear back from my office before this date  please call us  back or notify us  thru MyChart and we'll address it right away.    Please schedule a follow up visit in 6 months but call sooner if needed

## 2024-08-08 NOTE — Progress Notes (Unsigned)
 Alicia [name], female    DOB: 01/26/1941    MRN: [id number]   Brief patient profile:  87 yowf from [place]  living in [place] now  quit smoking 1998 s obvious sequelae  referred to pulmonary clinic in [facility]  06/27/2024 by Alicia [name]  for pulmonary fibrosis.    Breast ca was sept  2023 [place] rx RT to first the L and then R never chemo but within a few weeks of finishing RT noted  variable DOE > sometimes can't do 10 steps and sometime does treadmill at 67mph/ grade 2% x 15 min mild      History of Present Illness  06/27/2024  Pulmonary/ 1st [name] eval/ Alicia [name] / Alicia [name]  Chief Complaint  Patient presents with   Consult   Interstitial Lung Disease    Referred by Dr. Milo [name]. Pt c/o DOE x 2 years. Some days she gets winded walking room to room at home.   Dyspnea:  as above / variable assoc with midline cp with neg cards w/u Cough: minimal green comes and goes no recent abx  Sleep: bed is flat / 2 pillows with overt HB prilosec 40 mg 30 min before lunch  SABA use: not needing / did not help when she took it  02: none  Rec Patient Instructions  Pepcid 20mg   OTC  after supper or at least an hour before bedtime  GERD diet reviewed, bed blocks rec  Make sure you check your oxygen saturation at your highest level of activity(NOT after you stop)  to be sure it stays over 90%   Please schedule a follow up [name] visit in 6 weeks, call sooner if needed with PFTs on return > nl x for dlco 74%    08/08/2024  f/u ov/[place] [name]/Alicia [name] re: DOE  maint on gerd rx   Chief Complaint  Patient presents with   Shortness of Breath    F/u   Dyspnea:  better  Cough: gone x with deep inspiration   Sleeping: wedge/ no  resp cc  SABA use: not  using  02: none      No obvious day to day or daytime variability or assoc excess/ purulent sputum or mucus plugs or hemoptysis or cp or chest tightness, subjective wheeze or overt sinus or hb symptoms.    Also denies any obvious  fluctuation of symptoms with weather or environmental changes or other aggravating or alleviating factors except as outlined above   No unusual exposure hx or h/o childhood pna/ asthma or knowledge of premature birth.  Current Allergies, Complete Past Medical History, Past Surgical History, Family History, and Social History were reviewed in [employer] record.  ROS  The following are not active complaints unless bolded Hoarseness, sore throat, dysphagia, dental problems, itching, sneezing,  nasal congestion or discharge of excess mucus or purulent secretions, ear ache,   fever, chills, sweats, unintended wt loss or wt gain, classically pleuritic or exertional cp,  orthopnea pnd or arm/hand swelling  or leg swelling, presyncope, palpitations, abdominal pain, anorexia, nausea, vomiting, diarrhea  or change in bowel habits or change in bladder habits, change in stools or change in urine, dysuria, hematuria,  rash, arthralgias, visual complaints, headache, numbness, weakness or ataxia or problems with walking or coordination,  change in mood or  memory.        Current Meds  Medication Sig   acetaminophen  (TYLENOL ) 650 MG CR tablet Take 1,300 mg by mouth every 8 (eight) hours  as needed for pain.   albuterol  (VENTOLIN  HFA) 108 (90 Base) MCG/ACT inhaler Inhale 2 puffs into the lungs every 6 (six) hours as needed for wheezing or shortness of breath.   calcium  carbonate (TUMS - DOSED IN MG ELEMENTAL CALCIUM ) 500 MG chewable tablet Chew 1 tablet by mouth daily as needed for indigestion or heartburn.   Calcium  Magnesium  Zinc  333-133-5 MG TABS Take 1 tablet by mouth daily.   cyanocobalamin  (VITAMIN B12) 1000 MCG tablet Take 1,000 mcg by mouth daily.   docusate sodium  (COLACE) 100 MG capsule Take 1 capsule (100 mg total) by mouth daily.   ferrous sulfate  325 (65 FE) MG EC tablet Take 1 tablet (325 mg total) by mouth daily with breakfast.   fluticasone  (FLONASE ) 50 MCG/ACT nasal spray  Place 1 spray into both nostrils daily.   irbesartan  (AVAPRO ) 300 MG tablet Take 1 tablet (300 mg total) by mouth daily.   isosorbide  mononitrate (IMDUR ) 30 MG 24 hr tablet Take 0.5 tablets (15 mg total) by mouth daily. Take 1/2 (one-half) tablet by mouth once daily   LORazepam  (ATIVAN ) 0.5 MG tablet Take 0.5 mg by mouth 2 (two) times daily as needed for anxiety.   metoprolol  succinate (TOPROL  XL) 25 MG 24 hr tablet Take 0.5 tablets (12.5 mg total) by mouth daily.   Misc Natural Products (OSTEO BI-FLEX TRIPLE STRENGTH) TABS Take 1 tablet by mouth daily.   Multiple Minerals-Vitamins (CAL MAG ZINC  +D3 PO) Take 1 tablet by mouth daily.   multivitamin-lutein  (OCUVITE-LUTEIN ) CAPS Take 1 capsule by mouth daily.   omeprazole (PRILOSEC) 40 MG capsule Take 40 mg by mouth daily.   rOPINIRole  (REQUIP ) 1 MG tablet Take 1 tablet (1 mg total) by mouth 3 (three) times daily.   simvastatin  (ZOCOR ) 20 MG tablet Take 1 tablet (20 mg total) by mouth every evening.            Past Medical History:  Diagnosis Date   Anginal pain (HCC)    Anxiety 2014   Bursitis    Cancer (HCC) 06/2022   left breast DCIS   Dyspnea    with exertion   GERD (gastroesophageal reflux disease) 2008   History of radiation therapy    Left breast- 09/17/22-10/14/22- Dr. Lynwood Nasuti   History of radiation therapy    Right breast-09/23/23-10/28/23- Dr. Lynwood Nasuti   Hypercholesteremia 2003   Hypertension 2003   Insomnia 1988   Kidney stone    Osteoarthritis 2008   hands, knees, right hip   Osteopenia 2002   Restless leg syndrome 2000   RLS (restless legs syndrome) 03/28/2016   Vertigo 2002      Objective:    Wt Readings from Last 3 Encounters:  08/08/24 135 lb 12.8 oz (61.6 kg)  06/27/24 136 lb 3.2 oz (61.8 kg)  05/31/24 133 lb 6.4 oz (60.5 kg)    Vital signs reviewed  08/08/2024  - Note at rest 02 sats  95% on RA   General appearance:    amb pleasant wf nad     HEENT : Oropharynx  clear     Nasal turbinates nl     NECK :  without  apparent JVD/ palpable Nodes/TM    LUNGS: no acc muscle use,  Nl contour chest with crackles in bases and urge to cough on deep insp    CV:  RRR  no s3 or murmur or increase in P2, and no edema   ABD:  soft and nontender   MS:  Gait nl  ext warm without deformities Or obvious joint restrictions  calf tenderness, cyanosis or clubbing    SKIN: warm and dry without lesions    NEURO:  alert, approp, nl sensorium with  no motor or cerebellar deficits apparent.     I personally reviewed images and agree with radiology impression as follows:  CXR:   PA and lateral  05/16/24 1. No focal consolidations. Decreased conspicuity of previously noted diffuse interstitial coarsening. 2. Mild subpleural reticulations, which may represent a component of fibrosis    Assessment     Assessment & Plan DOE (dyspnea on exertion) Onset x 2024 p bilateral RT for breast ca completed Jan 2025  - max gerd rx  06/27/2024 >>>  - 06/27/2024   Walked on RA  x  3  lap(s) =  approx 450  ft  @ fast pace, stopped due to end of study  with lowest 02 sats 92%   - monitor with serial walking  06/27/2024  - PFTs 06/28/24  nl x for dlco 12.53 (74%)  - HRCT for 11/06/24 >>>  Although she feels better on gerd rx that's likely just due to cough improvement and needs to monitor ex sats for early signs of worsing PF that I assume is related to bilateral RT but have not ruled out UIP   Discussed in detail all the  indications, usual  risks and alternatives  relative to the benefits with patient who agrees to proceed with conservative f/u as outlined  with serial sats and proceed to HRCT in 3 m, call sooner if losing ground with sats or doe.          Each maintenance medication was reviewed in detail including emphasizing most importantly the difference between maintenance and prns and under what circumstances the prns are to be triggered using an action plan format where appropriate.  Total time for H  and P, chart review, counseling, reviewing pulse ox  device(s) and generating customized AVS unique to this [name] visit / same day charting = 35 min          AVS  Patient Instructions  Make sure you check your oxygen saturation at your highest level of activity (NOT after you stop)  to be sure it stays over 90% and keep track of it at least once a week, more often if breathing getting worse, and let me know if losing ground. (Collect the dots to connect the dots approach)     My [name] will be contacting you by phone for referral to high resolution CT chest  around 11/06/24  - if you don't hear back from my [name] before this date  please call us  back or notify us  thru MyChart and we'll address it right away.    Please schedule a follow up visit in 6 months but call sooner if needed    Ozell America, MD 08/09/2024

## 2024-08-09 ENCOUNTER — Encounter: Attending: Hematology and Oncology | Admitting: Rheumatology

## 2024-08-09 ENCOUNTER — Encounter: Payer: Self-pay | Admitting: Rheumatology

## 2024-08-09 VITALS — BP 128/66 | HR 60 | Temp 97.1°F | Resp 14 | Ht 61.0 in | Wt 135.6 lb

## 2024-08-09 DIAGNOSIS — K5790 Diverticulosis of intestine, part unspecified, without perforation or abscess without bleeding: Secondary | ICD-10-CM | POA: Insufficient documentation

## 2024-08-09 DIAGNOSIS — M25511 Pain in right shoulder: Secondary | ICD-10-CM | POA: Diagnosis not present

## 2024-08-09 DIAGNOSIS — M7061 Trochanteric bursitis, right hip: Secondary | ICD-10-CM | POA: Diagnosis not present

## 2024-08-09 DIAGNOSIS — I10 Essential (primary) hypertension: Secondary | ICD-10-CM | POA: Diagnosis not present

## 2024-08-09 DIAGNOSIS — Z8719 Personal history of other diseases of the digestive system: Secondary | ICD-10-CM | POA: Diagnosis not present

## 2024-08-09 DIAGNOSIS — R0989 Other specified symptoms and signs involving the circulatory and respiratory systems: Secondary | ICD-10-CM | POA: Diagnosis not present

## 2024-08-09 DIAGNOSIS — M8589 Other specified disorders of bone density and structure, multiple sites: Secondary | ICD-10-CM | POA: Diagnosis not present

## 2024-08-09 DIAGNOSIS — M545 Low back pain, unspecified: Secondary | ICD-10-CM | POA: Insufficient documentation

## 2024-08-09 DIAGNOSIS — M1612 Unilateral primary osteoarthritis, left hip: Secondary | ICD-10-CM | POA: Insufficient documentation

## 2024-08-09 DIAGNOSIS — M19042 Primary osteoarthritis, left hand: Secondary | ICD-10-CM | POA: Insufficient documentation

## 2024-08-09 DIAGNOSIS — G8929 Other chronic pain: Secondary | ICD-10-CM | POA: Insufficient documentation

## 2024-08-09 DIAGNOSIS — D0511 Intraductal carcinoma in situ of right breast: Secondary | ICD-10-CM | POA: Insufficient documentation

## 2024-08-09 DIAGNOSIS — M19041 Primary osteoarthritis, right hand: Secondary | ICD-10-CM | POA: Insufficient documentation

## 2024-08-09 DIAGNOSIS — M7062 Trochanteric bursitis, left hip: Secondary | ICD-10-CM | POA: Insufficient documentation

## 2024-08-09 DIAGNOSIS — D0512 Intraductal carcinoma in situ of left breast: Secondary | ICD-10-CM

## 2024-08-09 DIAGNOSIS — Z8659 Personal history of other mental and behavioral disorders: Secondary | ICD-10-CM | POA: Insufficient documentation

## 2024-08-09 DIAGNOSIS — G2581 Restless legs syndrome: Secondary | ICD-10-CM | POA: Insufficient documentation

## 2024-08-09 DIAGNOSIS — M25512 Pain in left shoulder: Secondary | ICD-10-CM | POA: Insufficient documentation

## 2024-08-09 DIAGNOSIS — E785 Hyperlipidemia, unspecified: Secondary | ICD-10-CM | POA: Insufficient documentation

## 2024-08-09 DIAGNOSIS — Z87442 Personal history of urinary calculi: Secondary | ICD-10-CM | POA: Insufficient documentation

## 2024-08-09 DIAGNOSIS — K5909 Other constipation: Secondary | ICD-10-CM | POA: Diagnosis not present

## 2024-08-09 MED ORDER — LIDOCAINE HCL 1 % IJ SOLN
1.5000 mL | INTRAMUSCULAR | Status: AC | PRN
  Administered 2024-08-09: 1.5 mL

## 2024-08-09 MED ORDER — TRIAMCINOLONE ACETONIDE 40 MG/ML IJ SUSP
40.0000 mg | INTRAMUSCULAR | Status: AC | PRN
  Administered 2024-08-09: 40 mg via INTRA_ARTICULAR

## 2024-08-09 NOTE — Assessment & Plan Note (Addendum)
 Onset x 2024 p bilateral RT for breast ca completed Jan 2025  - max gerd rx  06/27/2024 >>>  - 06/27/2024   Walked on RA  x  3  lap(s) =  approx 450  ft  @ fast pace, stopped due to end of study  with lowest 02 sats 92%   - monitor with serial walking  06/27/2024  - PFTs 06/28/24  nl x for dlco 12.53 (74%)  - HRCT for 11/06/24 >>>  Although she feels better on gerd rx that's likely just due to cough improvement and needs to monitor ex sats for early signs of worsing PF that I assume is related to bilateral RT but have not ruled out UIP   Discussed in detail all the  indications, usual  risks and alternatives  relative to the benefits with patient who agrees to proceed with conservative f/u as outlined  with serial sats and proceed to HRCT in 3 m, call sooner if losing ground with sats or doe.          Each maintenance medication was reviewed in detail including emphasizing most importantly the difference between maintenance and prns and under what circumstances the prns are to be triggered using an action plan format where appropriate.  Total time for H and P, chart review, counseling, reviewing pulse ox  device(s) and generating customized AVS unique to this office visit / same day charting = 35 min

## 2024-08-09 NOTE — Patient Instructions (Addendum)
 Osteopenia  Osteopenia is a loss of thickness (density) inside the bones. Another name for osteopenia is low bone mass. Mild osteopenia is a normal part of aging. It is not a disease, and it does not cause symptoms. However, if you have osteopenia and continue to lose bone mass, you could develop a condition that causes the bones to become thin and break more easily (osteoporosis). Osteoporosis can cause you to lose some height, have back pain, and have a stooped posture. Although osteopenia is not a disease, making changes to your lifestyle and diet can help to prevent osteopenia from developing into osteoporosis. What are the causes? Osteopenia is caused by loss of calcium  in the bones. Bones are constantly changing. Old bone cells are continually being replaced with new bone cells. This process builds new bone. The mineral calcium  is needed to build new bone and maintain bone density. Bone density is usually highest around age 13. After that, most people's bodies cannot replace all the bone they have lost with new bone. What increases the risk? You are more likely to develop this condition if: You are older than age 45. You are a woman who went through menopause early. You have a long illness that keeps you in bed. You do not get enough exercise. You lack certain nutrients (malnutrition). You have an overactive thyroid  gland (hyperthyroidism). You use products that contain nicotine or tobacco, such as cigarettes, e-cigarettes and chewing tobacco, or you drink a lot of alcohol. You are taking medicines that weaken the bones, such as steroids. What are the signs or symptoms? This condition does not cause any symptoms. You may have a slightly higher risk for bone breaks (fractures), so getting fractures more easily than normal may be an indication of osteopenia. How is this diagnosed? This condition may be diagnosed based on an X-ray exam that measures bone density (dual-energy X-ray  absorptiometry, or DEXA). This test can measure bone density in your hips, spine, and wrists. Osteopenia has no symptoms, so this condition is usually diagnosed after a routine bone density screening test is done for osteoporosis. This routine screening is usually done for: Women who are age 19 or older. Men who are age 52 or older. If you have risk factors for osteopenia, you may have the screening test at an earlier age. How is this treated? Making dietary and lifestyle changes can lower your risk for osteoporosis. If you have severe osteopenia that is close to becoming osteoporosis, this condition can be treated with medicines and dietary supplements such as calcium  and vitamin D. These supplements help to rebuild bone density. Follow these instructions at home: Eating and drinking Eat a diet that is high in calcium  and vitamin D. Calcium  is found in dairy products, beans, salmon, and leafy green vegetables like spinach and broccoli. Look for foods that have vitamin D and calcium  added to them (fortified foods), such as orange juice, cereal, and bread.  Lifestyle Do 30 minutes or more of a weight-bearing exercise every day, such as walking, jogging, or playing a sport. These types of exercises strengthen the bones. Do not use any products that contain nicotine or tobacco, such as cigarettes, e-cigarettes, and chewing tobacco. If you need help quitting, ask your health care provider. Do not drink alcohol if: Your health care provider tells you not to drink. You are pregnant, may be pregnant, or are planning to become pregnant. If you drink alcohol: Limit how much you use to: 0-1 drink a day for women. 0-2  drinks a day for men. Be aware of how much alcohol is in your drink. In the [place] one drink equals one 12 oz bottle of beer (355 mL), one 5 oz glass of wine (148 mL), or one 1 oz glass of hard liquor (44 mL). General instructions Take over-the-counter and prescription medicines only as  told by your health care provider. These include vitamins and supplements. Take precautions at home to lower your risk of falling, such as: Keeping rooms well-lit and free of clutter, such as cords. Installing safety rails on stairs. Using rubber mats in the bathroom or other areas that are often wet or slippery. Keep all follow-up visits. This is important. Contact a health care provider if: You have not had a bone density screening for osteoporosis and you are: A woman who is age 42 or older. A man who is age 79 or older. You are a postmenopausal woman who has not had a bone density screening for osteoporosis. You are older than age 50 and you want to know if you should have bone density screening for osteoporosis. Summary Osteopenia is a loss of thickness (density) inside the bones. Another name for osteopenia is low bone mass. Osteopenia is not a disease, but it may increase your risk for a condition that causes the bones to become thin and break more easily (osteoporosis). You may be at risk for osteopenia if you are older than age 46 or if you are a woman who went through early menopause. Osteopenia does not cause any symptoms, but it can be diagnosed with a bone density screening test. Dietary and lifestyle changes are the first treatment for osteopenia. These may lower your risk for osteoporosis. This information is not intended to replace advice given to you by your health care provider. Make sure you discuss any questions you have with your health care provider. Document Revised: 06/09/2023 Document Reviewed: 06/09/2023 Elsevier Patient Education  2025 [employer]Iliotibial Band Syndrome Rehab Ask your health care provider which exercises are safe for you. Do exercises exactly as told by your provider and adjust them as told. It's normal to feel mild stretching, pulling, tightness, or discomfort as you do these exercises. Stop right away if you feel sudden pain or your pain gets a  lot worse. Do not begin these exercises until told by your provider. Stretching and range-of-motion exercises These exercises warm up your muscles and joints. They also improve the movement and flexibility of your hip and pelvis. Quadriceps stretch, prone  Lie face down (prone) on a firm surface like a bed or padded floor. Bend your left / right knee. Reach back to hold your ankle or pant leg. If you can't reach your ankle or pant leg, use a belt looped around your foot and grab the belt instead. Gently pull your heel toward your butt. Your knee should not slide out to the side. You should feel a stretch in the front of your thigh and knee, also called the quadriceps. Hold this position for __________ seconds. Repeat __________ times. Complete this exercise __________ times a day. Iliotibial band stretch The iliotibial band is a strip of tissue that runs along the outside of your hip down to your knee. Lie on your side with your left / right leg on top. Bend both knees and grab your left / right ankle. Stretch out your bottom arm to help you balance. Slowly bring your top knee back so your thigh goes behind your back. Slowly lower your top leg  toward the floor until you feel a gentle stretch on the outside of your left / right hip and thigh. If you don't feel a stretch and your knee won't go farther, place the heel of your other foot on top of your knee and pull your knee down toward the floor with your foot. Hold this position for __________ seconds. Repeat __________ times. Complete this exercise __________ times a day. Strengthening exercises These exercises build strength and endurance in your hip and pelvis. Endurance means your muscles can keep working even when they're tired. Straight leg raises, side-lying This exercise strengthens the muscles that rotate the leg at the hip and move it away from your body. These muscles are called hip abductors. Lie on your side with your left / right  leg on top. Lie so your head, shoulder, hip, and knee line up. You can bend your bottom knee to help you balance. Roll your hips slightly forward so they're stacked directly over each other. Your left / right knee should face forward. Tense the muscles in your outer thigh and hip. Lift your top leg 4-6 inches (10-15 cm) off the ground. Hold this position for __________ seconds. Slowly lower your leg back down to the starting position. Let your muscles fully relax before doing this exercise again. Repeat __________ times. Complete this exercise __________ times a day. Leg raises, prone This exercise strengthens the muscles that move the hips backward. These muscles are called hip extensors. Lie face down (prone) on your bed or a firm surface. You can put a pillow under your hips for comfort and to support your lower back. Bend your left / right knee so your foot points straight up toward the ceiling. Keep the other leg straight and behind you. Squeeze your butt muscles. Lift your left / right thigh off the firm surface. Do not let your back arch. Tense your thigh muscle as hard as you can without having more knee pain. Hold this position for __________ seconds. Slowly lower your leg to the starting position. Allow your leg to relax all the way. Repeat __________ times. Complete this exercise __________ times a day. Hip hike  Stand sideways on a bottom step. Place your feet so that your left / right leg is on the step, and the other foot is hanging off the side. If you need support for balance, hold onto a railing or wall. Keep your knees straight and your abdomen square, meaning your hips are level. Then, lift your left / right hip up toward the ceiling. Slowly let your leg that's hanging off the step lower towards the floor. Your foot should get closer to the ground. Do not lean or bend your knees during this movement. Repeat __________ times. Complete this exercise __________ times a  day. This information is not intended to replace advice given to you by your health care provider. Make sure you discuss any questions you have with your health care provider. Document Revised: 12/05/2022 Document Reviewed: 12/05/2022 Elsevier Patient Education  2024 [employer] Inc[employer]  Alendronate Tablets What is this medication? ALENDRONATE (a LEN droe nate) prevents and treats osteoporosis. It may also be used to treat Paget disease of the bone. It works by interior and spatial designer stronger and less likely to break (fracture). It belongs to a group of medications called bisphosphonates. This medicine may be used for other purposes; ask your health care provider or pharmacist if you have questions. COMMON BRAND NAME(S): Fosamax What should I tell my care team  before I take this medication? They need to know if you have any of these conditions: Bleeding disorder Cancer Dental disease Difficulty swallowing Infection (fever, chills, cough, sore throat, pain or trouble passing urine) Kidney disease Low levels of calcium  or other minerals in the blood Low red blood cell counts Receiving steroids like dexamethasone  or prednisone Stomach or intestine problems Trouble sitting or standing for 30 minutes An unusual or allergic reaction to alendronate, other medications, foods, dyes or preservatives Pregnant or trying to get pregnant Breast-feeding How should I use this medication? Take this medication by mouth with a full glass of water. Take it as directed on the prescription label at the same time every day. Take the dose right after waking up. Do not eat or drink anything before taking it. Do not take it with any other drink except water. Do not chew or crush the tablet. After taking it, do not eat breakfast, drink, or take any other medications or vitamins for at least 30 minutes. Sit or stand up for at least 30 minutes after you take it. Do not lie down. Keep taking it unless your care team tells you to  stop. A special MedGuide will be given to you by the pharmacist with each prescription and refill. Be sure to read this information carefully each time. Talk to your care team about the use of this medication in children. Special care may be needed. Overdosage: If you think you have taken too much of this medicine contact a poison control center or emergency room at once. NOTE: This medicine is only for you. Do not share this medicine with others. What if I miss a dose? If you take your medication once a day, skip it. Take your next dose at the scheduled time the next morning. Do not take two doses on the same day. If you take your medication once a week, take the missed dose on the morning after you remember. Do not take two doses on the same day. What may interact with this medication? Aluminum hydroxide Antacids Aspirin  Calcium  supplements Medications for inflammation like ibuprofen, naproxen, and others Iron  supplements Magnesium  supplements Vitamins with minerals This list may not describe all possible interactions. Give your health care provider a list of all the medicines, herbs, non-prescription drugs, or dietary supplements you use. Also tell them if you smoke, drink alcohol, or use illegal drugs. Some items may interact with your medicine. What should I watch for while using this medication? Visit your care team for regular checks on your progress. It may be some time before you see the benefit from this medication. Some people who take this medication have severe bone, joint, or muscle pain. This medication may also increase your risk for jaw problems or a broken thigh bone. Tell your care team right away if you have severe pain in your jaw, bones, joints, or muscles. Tell you care team if you have any pain that does not go away or that gets worse. Tell your dentist and dental surgeon that you are taking this medication. You should not have major dental surgery while on this  medication. See your dentist to have a dental exam and fix any dental problems before starting this medication. Take good care of your teeth while on this medication. Make sure you see your dentist for regular follow-up appointments. You should make sure you get enough calcium  and vitamin D while you are taking this medication. Discuss the foods you eat and the vitamins you take  with your care team. You may need blood work done while you are taking this medication. What side effects may I notice from receiving this medication? Side effects that you should report to your care team as soon as possible: Allergic reactions--skin rash, itching, hives, swelling of the face, lips, tongue, or throat Low calcium  level--muscle pain or cramps, confusion, tingling, or numbness in the hands or feet Osteonecrosis of the jaw--pain, swelling, or redness in the mouth, numbness of the jaw, poor healing after dental work, unusual discharge from the mouth, visible bones in the mouth Pain or trouble swallowing Severe bone, joint, or muscle pain Stomach bleeding--bloody or black, tar-like stools, vomiting blood or brown material that looks like coffee grounds Side effects that usually do not require medical attention (report to your care team if they continue or are bothersome): Constipation Diarrhea Nausea Stomach pain This list may not describe all possible side effects. Call your doctor for medical advice about side effects. You may report side effects to FDA at 1-800-FDA-1088. Where should I keep my medication? Keep out of the reach of children and pets. Store at room temperature between 15 and 30 degrees C (59 and 86 degrees F). Throw away any unused medication after the expiration date. NOTE: This sheet is a summary. It may not cover all possible information. If you have questions about this medicine, talk to your doctor, pharmacist, or health care provider.  2024 Elsevier/Gold Standard (2020-10-04 00:00:00)

## 2024-08-11 DIAGNOSIS — R928 Other abnormal and inconclusive findings on diagnostic imaging of breast: Secondary | ICD-10-CM | POA: Diagnosis not present

## 2024-08-16 ENCOUNTER — Encounter: Payer: Self-pay | Admitting: Hematology and Oncology

## 2024-08-19 ENCOUNTER — Telehealth: Payer: Self-pay

## 2024-08-19 DIAGNOSIS — G8929 Other chronic pain: Secondary | ICD-10-CM

## 2024-08-19 DIAGNOSIS — M8589 Other specified disorders of bone density and structure, multiple sites: Secondary | ICD-10-CM

## 2024-08-19 DIAGNOSIS — M19041 Primary osteoarthritis, right hand: Secondary | ICD-10-CM

## 2024-08-19 DIAGNOSIS — E559 Vitamin D deficiency, unspecified: Secondary | ICD-10-CM

## 2024-08-19 NOTE — Telephone Encounter (Signed)
 Patient called the office advising that Fosomax was discussed at her last visit and she has did her research and is ready to give it a try. She would like it to be sent to the [facility] in [place].

## 2024-08-21 NOTE — Telephone Encounter (Signed)
 Please clarify if she has any recent lab work? We usually like to check a panel of labs prior to starting treatment-CMP, CBC, vitamin D, phosphorus, PTH, SPEP

## 2024-08-22 NOTE — Telephone Encounter (Signed)
 Patient states the last labs she had were in July and she has not had any other labs since then. Please advise.

## 2024-08-22 NOTE — Telephone Encounter (Signed)
 Attempted to contact the patient and left a message to call the office back.

## 2024-08-23 NOTE — Telephone Encounter (Signed)
 Recommend updating CMP and vitamin D prior to starting fosamax. Ok to return here for labs or she can go to her PCPs office

## 2024-08-24 NOTE — Addendum Note (Signed)
 Addended by: Jamani Eley P on: 08/24/2024 03:32 PM   Modules accepted: Orders

## 2024-08-24 NOTE — Telephone Encounter (Signed)
 Patient advised Recommend updating CMP and vitamin D prior to starting fosamax. Ok to return here for labs or she can go to her PCPs office. Patient verbalized understanding. Patient states she will come here for lab work the week after next. Will pend future lab orders.

## 2024-08-24 NOTE — Telephone Encounter (Signed)
 Attempted to contact the patient and left a message to call the office back.

## 2024-10-27 ENCOUNTER — Other Ambulatory Visit: Payer: Self-pay | Admitting: General Practice

## 2024-11-17 ENCOUNTER — Ambulatory Visit (HOSPITAL_COMMUNITY)

## 2025-02-07 ENCOUNTER — Encounter: Attending: Hematology and Oncology | Admitting: Rheumatology

## 2025-05-08 ENCOUNTER — Ambulatory Visit: Admitting: Hematology and Oncology
